# Patient Record
Sex: Male | Born: 1935 | Race: White | Hispanic: No | Marital: Married | Smoking: Current some day smoker
Health system: Southern US, Community
[De-identification: ages and names within clinical notes are randomized; demographics above are authoritative.]

## PROBLEM LIST (undated history)

## (undated) DIAGNOSIS — Z860100 Personal history of colon polyps, unspecified: Secondary | ICD-10-CM

## (undated) DIAGNOSIS — Z87442 Personal history of urinary calculi: Secondary | ICD-10-CM

## (undated) DIAGNOSIS — E785 Hyperlipidemia, unspecified: Secondary | ICD-10-CM

## (undated) DIAGNOSIS — I4430 Unspecified atrioventricular block: Secondary | ICD-10-CM

## (undated) DIAGNOSIS — R238 Other skin changes: Secondary | ICD-10-CM

## (undated) DIAGNOSIS — R3915 Urgency of urination: Secondary | ICD-10-CM

## (undated) DIAGNOSIS — J449 Chronic obstructive pulmonary disease, unspecified: Secondary | ICD-10-CM

## (undated) DIAGNOSIS — C61 Malignant neoplasm of prostate: Secondary | ICD-10-CM

## (undated) DIAGNOSIS — R600 Localized edema: Secondary | ICD-10-CM

## (undated) DIAGNOSIS — R519 Headache, unspecified: Secondary | ICD-10-CM

## (undated) DIAGNOSIS — Z86718 Personal history of other venous thrombosis and embolism: Secondary | ICD-10-CM

## (undated) DIAGNOSIS — M1990 Unspecified osteoarthritis, unspecified site: Secondary | ICD-10-CM

## (undated) DIAGNOSIS — R42 Dizziness and giddiness: Secondary | ICD-10-CM

## (undated) DIAGNOSIS — G4700 Insomnia, unspecified: Secondary | ICD-10-CM

## (undated) DIAGNOSIS — R351 Nocturia: Secondary | ICD-10-CM

## (undated) DIAGNOSIS — I2510 Atherosclerotic heart disease of native coronary artery without angina pectoris: Secondary | ICD-10-CM

## (undated) DIAGNOSIS — D649 Anemia, unspecified: Secondary | ICD-10-CM

## (undated) DIAGNOSIS — I720 Aneurysm of carotid artery: Secondary | ICD-10-CM

## (undated) DIAGNOSIS — C3490 Malignant neoplasm of unspecified part of unspecified bronchus or lung: Secondary | ICD-10-CM

## (undated) DIAGNOSIS — I10 Essential (primary) hypertension: Secondary | ICD-10-CM

## (undated) DIAGNOSIS — R51 Headache: Secondary | ICD-10-CM

## (undated) DIAGNOSIS — R233 Spontaneous ecchymoses: Secondary | ICD-10-CM

## (undated) DIAGNOSIS — M797 Fibromyalgia: Secondary | ICD-10-CM

## (undated) DIAGNOSIS — G2581 Restless legs syndrome: Secondary | ICD-10-CM

## (undated) DIAGNOSIS — R609 Edema, unspecified: Secondary | ICD-10-CM

## (undated) DIAGNOSIS — R0602 Shortness of breath: Secondary | ICD-10-CM

## (undated) DIAGNOSIS — K219 Gastro-esophageal reflux disease without esophagitis: Secondary | ICD-10-CM

## (undated) HISTORY — PX: CATARACT EXTRACTION W/ INTRAOCULAR LENS IMPLANT: SHX1309

## (undated) HISTORY — PX: LITHOTRIPSY: SUR834

## (undated) HISTORY — DX: Essential (primary) hypertension: I10

## (undated) HISTORY — DX: Shortness of breath: R06.02

## (undated) HISTORY — PX: INSERT / REPLACE / REMOVE PACEMAKER: SUR710

## (undated) HISTORY — DX: Hyperlipidemia, unspecified: E78.5

## (undated) HISTORY — DX: Chronic obstructive pulmonary disease, unspecified: J44.9

## (undated) HISTORY — PX: PROSTATE CRYOABLATION: SUR358

## (undated) HISTORY — PX: CYSTOSCOPY: SUR368

## (undated) HISTORY — DX: Atherosclerotic heart disease of native coronary artery without angina pectoris: I25.10

## (undated) HISTORY — DX: Anemia, unspecified: D64.9

## (undated) HISTORY — PX: CARDIAC CATHETERIZATION: SHX172

## (undated) HISTORY — PX: COLONOSCOPY: SHX174

## (undated) HISTORY — DX: Gastro-esophageal reflux disease without esophagitis: K21.9

## (undated) HISTORY — DX: Unspecified osteoarthritis, unspecified site: M19.90

---
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
## 1989-03-23 HISTORY — PX: INSERT / REPLACE / REMOVE PACEMAKER: SUR710

## 1998-10-09 ENCOUNTER — Encounter: Payer: Self-pay | Admitting: Gastroenterology

## 1998-10-09 ENCOUNTER — Ambulatory Visit (HOSPITAL_COMMUNITY): Admission: RE | Admit: 1998-10-09 | Discharge: 1998-10-09 | Payer: Self-pay | Admitting: Gastroenterology

## 1998-11-22 HISTORY — PX: CHOLECYSTECTOMY: SHX55

## 1998-11-22 HISTORY — PX: APPENDECTOMY: SHX54

## 1998-12-27 ENCOUNTER — Other Ambulatory Visit: Admission: RE | Admit: 1998-12-27 | Discharge: 1998-12-27 | Payer: Self-pay | Admitting: Gastroenterology

## 1998-12-27 ENCOUNTER — Encounter (INDEPENDENT_AMBULATORY_CARE_PROVIDER_SITE_OTHER): Payer: Self-pay | Admitting: Specialist

## 1999-10-25 ENCOUNTER — Inpatient Hospital Stay (HOSPITAL_COMMUNITY): Admission: EM | Admit: 1999-10-25 | Discharge: 1999-10-28 | Payer: Self-pay | Admitting: *Deleted

## 1999-10-25 ENCOUNTER — Encounter: Payer: Self-pay | Admitting: *Deleted

## 2000-02-10 ENCOUNTER — Other Ambulatory Visit: Admission: RE | Admit: 2000-02-10 | Discharge: 2000-02-10 | Payer: Self-pay | Admitting: Gastroenterology

## 2000-02-10 ENCOUNTER — Encounter (INDEPENDENT_AMBULATORY_CARE_PROVIDER_SITE_OTHER): Payer: Self-pay

## 2000-05-12 ENCOUNTER — Ambulatory Visit (HOSPITAL_COMMUNITY): Admission: RE | Admit: 2000-05-12 | Discharge: 2000-05-12 | Payer: Self-pay | Admitting: Neurosurgery

## 2000-05-12 ENCOUNTER — Encounter: Payer: Self-pay | Admitting: Neurosurgery

## 2000-05-19 ENCOUNTER — Ambulatory Visit: Admission: RE | Admit: 2000-05-19 | Discharge: 2000-05-19 | Payer: Self-pay | Admitting: Interventional Radiology

## 2000-05-21 ENCOUNTER — Ambulatory Visit (HOSPITAL_COMMUNITY): Admission: RE | Admit: 2000-05-21 | Discharge: 2000-05-21 | Payer: Self-pay | Admitting: Interventional Radiology

## 2001-06-01 ENCOUNTER — Ambulatory Visit (HOSPITAL_COMMUNITY): Admission: RE | Admit: 2001-06-01 | Discharge: 2001-06-01 | Payer: Self-pay | Admitting: Vascular Surgery

## 2001-06-01 ENCOUNTER — Encounter: Payer: Self-pay | Admitting: Vascular Surgery

## 2002-07-04 ENCOUNTER — Ambulatory Visit (HOSPITAL_COMMUNITY): Admission: RE | Admit: 2002-07-04 | Discharge: 2002-07-05 | Payer: Self-pay | Admitting: Vascular Surgery

## 2002-07-04 ENCOUNTER — Encounter: Payer: Self-pay | Admitting: Vascular Surgery

## 2003-07-19 ENCOUNTER — Encounter: Admission: RE | Admit: 2003-07-19 | Discharge: 2003-07-19 | Payer: Self-pay | Admitting: Vascular Surgery

## 2004-01-03 ENCOUNTER — Ambulatory Visit (HOSPITAL_COMMUNITY): Admission: RE | Admit: 2004-01-03 | Discharge: 2004-01-03 | Payer: Self-pay | Admitting: Cardiology

## 2004-01-28 ENCOUNTER — Ambulatory Visit: Payer: Self-pay | Admitting: Cardiology

## 2004-02-01 ENCOUNTER — Ambulatory Visit: Payer: Self-pay | Admitting: Cardiology

## 2004-03-03 ENCOUNTER — Ambulatory Visit: Payer: Self-pay | Admitting: Cardiovascular Disease

## 2004-03-31 ENCOUNTER — Emergency Department (HOSPITAL_COMMUNITY): Admission: EM | Admit: 2004-03-31 | Discharge: 2004-04-01 | Payer: Self-pay | Admitting: Emergency Medicine

## 2004-03-31 ENCOUNTER — Ambulatory Visit: Payer: Self-pay | Admitting: Cardiology

## 2004-04-02 ENCOUNTER — Ambulatory Visit: Payer: Self-pay | Admitting: Cardiology

## 2004-04-03 ENCOUNTER — Ambulatory Visit: Payer: Self-pay | Admitting: Internal Medicine

## 2004-04-14 ENCOUNTER — Ambulatory Visit: Payer: Self-pay | Admitting: Internal Medicine

## 2004-04-16 ENCOUNTER — Emergency Department (HOSPITAL_COMMUNITY): Admission: EM | Admit: 2004-04-16 | Discharge: 2004-04-16 | Payer: Self-pay | Admitting: Emergency Medicine

## 2004-04-18 ENCOUNTER — Ambulatory Visit: Payer: Self-pay | Admitting: Hematology & Oncology

## 2004-05-13 ENCOUNTER — Ambulatory Visit: Payer: Self-pay | Admitting: Cardiology

## 2004-06-01 ENCOUNTER — Ambulatory Visit: Payer: Self-pay | Admitting: Internal Medicine

## 2004-06-01 ENCOUNTER — Inpatient Hospital Stay (HOSPITAL_COMMUNITY): Admission: EM | Admit: 2004-06-01 | Discharge: 2004-06-03 | Payer: Self-pay | Admitting: Emergency Medicine

## 2004-06-02 ENCOUNTER — Encounter: Payer: Self-pay | Admitting: Internal Medicine

## 2004-06-10 ENCOUNTER — Ambulatory Visit: Payer: Self-pay | Admitting: Internal Medicine

## 2004-06-30 ENCOUNTER — Ambulatory Visit: Payer: Self-pay | Admitting: Cardiology

## 2004-07-28 ENCOUNTER — Ambulatory Visit: Payer: Self-pay | Admitting: *Deleted

## 2004-08-25 ENCOUNTER — Ambulatory Visit: Payer: Self-pay | Admitting: Cardiology

## 2004-09-22 ENCOUNTER — Ambulatory Visit: Payer: Self-pay | Admitting: Cardiology

## 2004-10-02 ENCOUNTER — Ambulatory Visit: Payer: Self-pay | Admitting: Cardiology

## 2004-12-10 ENCOUNTER — Ambulatory Visit: Payer: Self-pay | Admitting: Cardiology

## 2004-12-17 ENCOUNTER — Emergency Department (HOSPITAL_COMMUNITY): Admission: EM | Admit: 2004-12-17 | Discharge: 2004-12-17 | Payer: Self-pay | Admitting: Emergency Medicine

## 2004-12-22 ENCOUNTER — Ambulatory Visit (HOSPITAL_COMMUNITY): Admission: RE | Admit: 2004-12-22 | Discharge: 2004-12-24 | Payer: Self-pay | Admitting: General Surgery

## 2004-12-22 ENCOUNTER — Encounter (INDEPENDENT_AMBULATORY_CARE_PROVIDER_SITE_OTHER): Payer: Self-pay | Admitting: Specialist

## 2004-12-22 ENCOUNTER — Ambulatory Visit: Payer: Self-pay | Admitting: Gastroenterology

## 2005-05-04 ENCOUNTER — Ambulatory Visit: Payer: Self-pay | Admitting: Cardiology

## 2005-05-04 ENCOUNTER — Observation Stay (HOSPITAL_COMMUNITY): Admission: EM | Admit: 2005-05-04 | Discharge: 2005-05-05 | Payer: Self-pay | Admitting: Emergency Medicine

## 2005-05-05 ENCOUNTER — Ambulatory Visit: Payer: Self-pay

## 2005-06-18 ENCOUNTER — Ambulatory Visit: Payer: Self-pay | Admitting: Cardiology

## 2005-08-10 ENCOUNTER — Encounter: Admission: RE | Admit: 2005-08-10 | Discharge: 2005-08-10 | Payer: Self-pay | Admitting: Vascular Surgery

## 2005-08-31 ENCOUNTER — Ambulatory Visit: Payer: Self-pay | Admitting: Cardiology

## 2005-09-04 ENCOUNTER — Ambulatory Visit (HOSPITAL_BASED_OUTPATIENT_CLINIC_OR_DEPARTMENT_OTHER): Admission: RE | Admit: 2005-09-04 | Discharge: 2005-09-04 | Payer: Self-pay | Admitting: Urology

## 2005-09-07 ENCOUNTER — Ambulatory Visit (HOSPITAL_COMMUNITY): Admission: RE | Admit: 2005-09-07 | Discharge: 2005-09-07 | Payer: Self-pay | Admitting: Urology

## 2006-01-26 ENCOUNTER — Ambulatory Visit: Payer: Self-pay | Admitting: Internal Medicine

## 2006-03-03 ENCOUNTER — Ambulatory Visit: Payer: Self-pay | Admitting: Internal Medicine

## 2006-07-20 ENCOUNTER — Ambulatory Visit: Payer: Self-pay | Admitting: Cardiology

## 2007-05-02 ENCOUNTER — Ambulatory Visit: Payer: Self-pay | Admitting: Cardiology

## 2007-05-11 ENCOUNTER — Ambulatory Visit: Payer: Self-pay | Admitting: Internal Medicine

## 2007-05-31 ENCOUNTER — Ambulatory Visit: Payer: Self-pay | Admitting: Cardiology

## 2007-06-17 ENCOUNTER — Encounter: Payer: Self-pay | Admitting: Adult Health

## 2007-06-17 ENCOUNTER — Ambulatory Visit: Payer: Self-pay | Admitting: Pulmonary Disease

## 2007-06-17 ENCOUNTER — Ambulatory Visit: Payer: Self-pay | Admitting: Internal Medicine

## 2007-06-17 DIAGNOSIS — R0602 Shortness of breath: Secondary | ICD-10-CM

## 2007-06-17 DIAGNOSIS — J449 Chronic obstructive pulmonary disease, unspecified: Secondary | ICD-10-CM

## 2007-06-17 DIAGNOSIS — K219 Gastro-esophageal reflux disease without esophagitis: Secondary | ICD-10-CM

## 2007-06-17 DIAGNOSIS — J4489 Other specified chronic obstructive pulmonary disease: Secondary | ICD-10-CM | POA: Insufficient documentation

## 2007-06-17 DIAGNOSIS — T17308A Unspecified foreign body in larynx causing other injury, initial encounter: Secondary | ICD-10-CM

## 2007-08-12 ENCOUNTER — Encounter: Admission: RE | Admit: 2007-08-12 | Discharge: 2007-08-12 | Payer: Self-pay | Admitting: Vascular Surgery

## 2007-08-12 ENCOUNTER — Ambulatory Visit: Payer: Self-pay | Admitting: Vascular Surgery

## 2008-01-23 ENCOUNTER — Ambulatory Visit (HOSPITAL_COMMUNITY): Admission: RE | Admit: 2008-01-23 | Discharge: 2008-01-23 | Payer: Self-pay | Admitting: Urology

## 2008-02-08 ENCOUNTER — Telehealth (INDEPENDENT_AMBULATORY_CARE_PROVIDER_SITE_OTHER): Payer: Self-pay | Admitting: *Deleted

## 2008-03-01 ENCOUNTER — Ambulatory Visit: Payer: Self-pay | Admitting: Cardiology

## 2008-03-02 ENCOUNTER — Ambulatory Visit: Payer: Self-pay

## 2008-04-02 ENCOUNTER — Ambulatory Visit (HOSPITAL_BASED_OUTPATIENT_CLINIC_OR_DEPARTMENT_OTHER): Admission: RE | Admit: 2008-04-02 | Discharge: 2008-04-02 | Payer: Self-pay | Admitting: Urology

## 2008-07-13 ENCOUNTER — Encounter (INDEPENDENT_AMBULATORY_CARE_PROVIDER_SITE_OTHER): Payer: Self-pay | Admitting: Radiology

## 2008-10-03 ENCOUNTER — Encounter: Admission: RE | Admit: 2008-10-03 | Discharge: 2008-10-03 | Payer: Self-pay | Admitting: Family Medicine

## 2008-10-06 ENCOUNTER — Encounter: Payer: Self-pay | Admitting: Internal Medicine

## 2008-10-16 ENCOUNTER — Encounter: Payer: Self-pay | Admitting: Cardiology

## 2008-10-24 ENCOUNTER — Encounter: Payer: Self-pay | Admitting: Cardiology

## 2009-03-12 DIAGNOSIS — I10 Essential (primary) hypertension: Secondary | ICD-10-CM

## 2009-03-12 DIAGNOSIS — E785 Hyperlipidemia, unspecified: Secondary | ICD-10-CM

## 2009-03-12 DIAGNOSIS — I2699 Other pulmonary embolism without acute cor pulmonale: Secondary | ICD-10-CM | POA: Insufficient documentation

## 2009-03-12 DIAGNOSIS — R42 Dizziness and giddiness: Secondary | ICD-10-CM | POA: Insufficient documentation

## 2009-03-12 DIAGNOSIS — R079 Chest pain, unspecified: Secondary | ICD-10-CM

## 2009-03-12 DIAGNOSIS — I2510 Atherosclerotic heart disease of native coronary artery without angina pectoris: Secondary | ICD-10-CM

## 2009-03-12 DIAGNOSIS — R000 Tachycardia, unspecified: Secondary | ICD-10-CM

## 2009-03-21 ENCOUNTER — Ambulatory Visit: Payer: Self-pay | Admitting: Cardiology

## 2009-03-21 DIAGNOSIS — Z950 Presence of cardiac pacemaker: Secondary | ICD-10-CM

## 2009-04-10 ENCOUNTER — Encounter: Payer: Self-pay | Admitting: Internal Medicine

## 2009-04-10 ENCOUNTER — Telehealth: Payer: Self-pay | Admitting: Cardiology

## 2009-07-10 ENCOUNTER — Encounter: Payer: Self-pay | Admitting: Internal Medicine

## 2009-08-16 ENCOUNTER — Encounter: Admission: RE | Admit: 2009-08-16 | Discharge: 2009-08-16 | Payer: Self-pay | Admitting: Vascular Surgery

## 2009-08-16 ENCOUNTER — Ambulatory Visit: Payer: Self-pay | Admitting: Vascular Surgery

## 2009-10-09 ENCOUNTER — Encounter: Payer: Self-pay | Admitting: Internal Medicine

## 2009-10-22 ENCOUNTER — Telehealth: Payer: Self-pay | Admitting: Cardiology

## 2009-10-24 ENCOUNTER — Ambulatory Visit: Payer: Self-pay | Admitting: Cardiology

## 2009-10-28 LAB — CONVERTED CEMR LAB
Basophils Relative: 0.9 % (ref 0.0–3.0)
CO2: 27 meq/L (ref 19–32)
Calcium: 11.4 mg/dL — ABNORMAL HIGH (ref 8.4–10.5)
Creatinine, Ser: 0.9 mg/dL (ref 0.4–1.5)
Eosinophils Absolute: 0.1 10*3/uL (ref 0.0–0.7)
Glucose, Bld: 87 mg/dL (ref 70–99)
HCT: 43.9 % (ref 39.0–52.0)
Lymphs Abs: 2.6 10*3/uL (ref 0.7–4.0)
MCHC: 34.4 g/dL (ref 30.0–36.0)
MCV: 93.1 fL (ref 78.0–100.0)
Monocytes Absolute: 0.6 10*3/uL (ref 0.1–1.0)
Neutrophils Relative %: 58.5 % (ref 43.0–77.0)
Platelets: 295 10*3/uL (ref 150.0–400.0)
RBC: 4.72 M/uL (ref 4.22–5.81)

## 2009-11-06 ENCOUNTER — Telehealth (INDEPENDENT_AMBULATORY_CARE_PROVIDER_SITE_OTHER): Payer: Self-pay | Admitting: *Deleted

## 2009-11-07 ENCOUNTER — Ambulatory Visit (HOSPITAL_COMMUNITY): Admission: RE | Admit: 2009-11-07 | Discharge: 2009-11-07 | Payer: Self-pay | Admitting: Cardiology

## 2009-11-07 ENCOUNTER — Ambulatory Visit: Payer: Self-pay

## 2009-11-07 ENCOUNTER — Encounter: Payer: Self-pay | Admitting: Internal Medicine

## 2009-11-07 ENCOUNTER — Ambulatory Visit: Payer: Self-pay | Admitting: Cardiology

## 2009-11-07 ENCOUNTER — Encounter: Payer: Self-pay | Admitting: Cardiology

## 2009-11-07 ENCOUNTER — Ambulatory Visit: Payer: Self-pay | Admitting: Internal Medicine

## 2009-11-07 ENCOUNTER — Encounter (HOSPITAL_COMMUNITY): Admission: RE | Admit: 2009-11-07 | Discharge: 2009-12-09 | Payer: Self-pay | Admitting: Cardiology

## 2009-11-07 LAB — CONVERTED CEMR LAB
Calcium: 11 mg/dL — ABNORMAL HIGH (ref 8.4–10.5)
Creatinine, Ser: 0.8 mg/dL (ref 0.4–1.5)
GFR calc non Af Amer: 98.81 mL/min (ref >60)
Glucose, Bld: 74 mg/dL (ref 70–99)
Sodium: 142 meq/L (ref 135–145)

## 2009-11-11 ENCOUNTER — Telehealth: Payer: Self-pay | Admitting: Cardiology

## 2009-11-12 ENCOUNTER — Ambulatory Visit: Payer: Self-pay | Admitting: Cardiology

## 2009-11-19 ENCOUNTER — Ambulatory Visit: Payer: Self-pay | Admitting: Internal Medicine

## 2009-11-21 ENCOUNTER — Encounter: Payer: Self-pay | Admitting: Cardiology

## 2009-11-22 ENCOUNTER — Encounter (INDEPENDENT_AMBULATORY_CARE_PROVIDER_SITE_OTHER): Payer: Self-pay | Admitting: *Deleted

## 2009-11-22 ENCOUNTER — Ambulatory Visit: Payer: Self-pay | Admitting: Cardiology

## 2009-11-22 DIAGNOSIS — I442 Atrioventricular block, complete: Secondary | ICD-10-CM

## 2009-11-22 LAB — CONVERTED CEMR LAB
BUN: 15 mg/dL (ref 6–23)
Basophils Relative: 0.8 % (ref 0.0–3.0)
Calcium: 10.4 mg/dL (ref 8.4–10.5)
Chloride: 102 meq/L (ref 96–112)
Creatinine, Ser: 1.2 mg/dL (ref 0.4–1.5)
Eosinophils Relative: 2.9 % (ref 0.0–5.0)
GFR calc non Af Amer: 62.77 mL/min (ref >60)
INR: 1.6 — ABNORMAL HIGH (ref 0.8–1.0)
Lymphocytes Relative: 30.5 % (ref 12.0–46.0)
MCV: 93.2 fL (ref 78.0–100.0)
Monocytes Relative: 9.7 % (ref 3.0–12.0)
Neutrophils Relative %: 56.1 % (ref 43.0–77.0)
Platelets: 244 10*3/uL (ref 150.0–400.0)
Prothrombin Time: 16.8 s — ABNORMAL HIGH (ref 9.7–11.8)
RBC: 4.26 M/uL (ref 4.22–5.81)
WBC: 7.7 10*3/uL (ref 4.5–10.5)
aPTT: 36.2 s — ABNORMAL HIGH (ref 21.7–28.8)

## 2009-11-26 ENCOUNTER — Ambulatory Visit: Payer: Self-pay | Admitting: Cardiology

## 2009-11-26 ENCOUNTER — Ambulatory Visit (HOSPITAL_COMMUNITY): Admission: RE | Admit: 2009-11-26 | Discharge: 2009-11-26 | Payer: Self-pay | Admitting: Cardiology

## 2009-11-27 ENCOUNTER — Encounter: Payer: Self-pay | Admitting: Cardiology

## 2009-12-11 ENCOUNTER — Ambulatory Visit: Payer: Self-pay | Admitting: Cardiology

## 2009-12-30 ENCOUNTER — Encounter: Payer: Self-pay | Admitting: Internal Medicine

## 2010-01-17 ENCOUNTER — Encounter (INDEPENDENT_AMBULATORY_CARE_PROVIDER_SITE_OTHER): Payer: Self-pay | Admitting: *Deleted

## 2010-03-20 ENCOUNTER — Ambulatory Visit: Payer: Self-pay | Admitting: Internal Medicine

## 2010-04-02 ENCOUNTER — Encounter (INDEPENDENT_AMBULATORY_CARE_PROVIDER_SITE_OTHER): Payer: Self-pay | Admitting: *Deleted

## 2010-04-12 ENCOUNTER — Encounter: Payer: Self-pay | Admitting: Gastroenterology

## 2010-04-13 ENCOUNTER — Encounter: Payer: Self-pay | Admitting: Vascular Surgery

## 2010-04-22 NOTE — Progress Notes (Signed)
Summary: Nuclear pre procedure  Phone Note Outgoing Call Call back at [facility] Phone [phone]   Call placed by: Rea College, CMA,  November 06, 2009 4:12 PM Call placed to: Patient Summary of Call: Reviewed information on Myoview Information Sheet (see scanned document for further details).  Annice Pih spoke with wife.   Initial call taken by: Rea College, CMA,  November 06, 2009 4:12 PM     Nuclear Med Background Indications for Stress Test: Evaluation for Ischemia   History: COPD, Echo, Heart Catheterization, Myocardial Perfusion Study, Pacemaker  History Comments: '91 PTVP; '01 Cath:n/o CAD; '06 Echo:normal LVF; '09 MWN:UUVOZD; h/o PE  Symptoms: DOE, SOB    Nuclear Pre-Procedure Cardiac Risk Factors: Family History - CAD, History of Smoking, Hypertension, Lipids Height (in): 60

## 2010-04-22 NOTE — Miscellaneous (Signed)
Summary: Device change out  Clinical Lists Changes  Observations: Added new observation of PPM DOI: 11/26/2009 (11/27/2009 9:20) Added new observation of PPM SERL#: [id number]  (11/27/2009 9:20) Added new observation of PPM MODL#: [id number]  (11/27/2009 1:19) Added new observation of PPMEXPLCOMM: 11/26/09 St. Jude [id number] explanted  (11/27/2009 9:20) Added new observation of MAGNET RTE: BOL 100 ERI 85  (11/27/2009 9:20)      PPM Specifications Following MD:  Everardo Beals. Juanda Chance, MD     PPM Vendor:  St Jude     PPM Model Number:  [id number]     PPM Serial Number:  [id number] PPM DOI:  11/26/2009     PPM Implanting MD:  Everardo Beals. Juanda Chance, MD  Lead 1    Location: RA     DOI: 10/11/1989     Model #: [id number]     Serial #: [id number]     Status: active Lead 2    Location: RV     DOI: 10/11/1989     Model #: 1216T     Serial #: [id number]     Status: active  Magnet Response Rate:  BOL 100 ERI 85  Indications:  COMPLETE HEART BLOCK SYNCOPE  Explantation Comments:  11/26/09 St. Jude [id number] explanted  PPM Follow Up Pacer Dependent:  No      Parameters Mode:  DDD     Lower Rate Limit:  55     Upper Rate Limit:  110 Paced AV Delay:  300

## 2010-04-22 NOTE — Cardiovascular Report (Signed)
Summary: TTM   TTM   Imported By: Roderic Ovens 04/30/2009 15:58:46  _____________________________________________________________________  External Attachment:    Type:   Image     Comment:   External Document

## 2010-04-22 NOTE — Miscellaneous (Signed)
Summary: dx correction  Clinical Lists Changes  Problems: Changed problem from PACEMAKER (ICD-V45..01) to PACEMAKER, PERMANENT (ICD-V45.01) changed the incorrect dx code to correct dx code Donna Keene  January 17, 2010 12:45 PM 

## 2010-04-22 NOTE — Assessment & Plan Note (Signed)
Summary: Cardiology Nuclear Testing  Nuclear Med Background Indications for Stress Test: Evaluation for Ischemia   History: COPD, Echo, Heart Catheterization, Myocardial Perfusion Study, Pacemaker  History Comments: '91 PTVP; '01 Cath:n/o CAD; '06 Echo:normal LVF; '09 ZOX:WRUEAV; h/o PE  Symptoms: DOE, SOB    Nuclear Pre-Procedure Cardiac Risk Factors: Family History - CAD, History of Smoking, Hypertension, Lipids Caffeine/Decaff Intake: None NPO After: 9:30 PM Lungs: clear IV 0.9% NS with Angio Cath: 22g     IV Site: Rt AC IV Started by: Bonnita Levan RN Chest Size (in) 42     Height (in): 60 Weight (lb): 169 BMI: 33.12  Nuclear Med Study 1 or 2 day study:  1 day     Stress Test Type:  Eugenie Birks Reading MD:  Arvilla Meres, MD     Referring MD:  B.Brodie Resting Radionuclide:  Technetium 39m Tetrofosmin     Resting Radionuclide Dose:  10.8 mCi  Stress Radionuclide:  Technetium 14m Tetrofosmin     Stress Radionuclide Dose:  33 mCi   Stress Protocol   Lexiscan: 0.4 mg   Stress Test Technologist:  Milana Na EMT-P     Nuclear Technologist:  Domenic Polite CNMT  Rest Procedure  Myocardial perfusion imaging was performed at rest 45 minutes following the intravenous administration of Myoview Technetium 27m Tetrofosmin.  Stress Procedure  The patient received IV Lexiscan 0.4 mg over 15-seconds.  Myoview injected at 30-seconds.  There were no significant changes, sob, and rare pacs/pvc with infusion.  Quantitative spect images were obtained after a 45 minute delay.  QPS Raw Data Images:  Normal; no motion artifact; normal heart/lung ratio. Stress Images:  There is normal uptake in all areas. Rest Images:  Normal homogeneous uptake in all areas of the myocardium. Subtraction (SDS):  Normal Transient Ischemic Dilatation:  .93  (Normal <1.22)  Lung/Heart Ratio:  .29  (Normal <0.45)  Quantitative Gated Spect Images QGS EDV:  108 ml QGS ESV:  31 ml QGS EF:  71 % QGS  cine images:  Normal  Findings Normal nuclear study      Overall Impression  Exercise Capacity: Lexiscan study with no exercise. ECG Impression: Baseline: NSR; No significant ST segment change with Lexiscan. Overall Impression: Normal stress nuclear study.  Appended Document: Cardiology Nuclear Testing Pt. aware of test results.

## 2010-04-22 NOTE — Progress Notes (Signed)
Summary: test results   Phone Note Call from Patient Call back at Home Phone [phone]   Caller: Patient Reason for Call: Talk to Nurse, Lab or Test Results Details for Reason: stress test Initial call taken by: Lorne Skeens,  November 11, 2009 10:52 AM  Follow-up for Phone Call        Spoke with Pt. stress test and echo results given. Pt. is scheduled for lab work D-Dimer, tomorrow 11/12/09. A  PFT was order for pt. Patient needs a F/U with Dr. Juanda Chance after labs and PFT is done Pt. aware. Follow-up by: Ollen Gross, RN, BSN,  November 11, 2009 11:26 AM

## 2010-04-22 NOTE — Assessment & Plan Note (Signed)
Summary: rov  Medications Added TYLENOL 325 MG  TABS (ACETAMINOPHEN) per bottle prn ALKA-SELTZER PLUS COLD/SINUS 30-325 MG  CAPS (PSEUDOEPHEDRINE-APAP) per container prn      Allergies Added:   Visit Type:  Follow-up Primary Provider:  Dr Ishmael Holter garden  CC:  sob and swelling in ankles.  History of Present Illness: the patient is 77 years old and came in today for an unscheduled visit because of shortness of breath. He says he gets short of breath just walking to the mailbox. This has been progressive over the last few weeks to months. He has had no associated chest pain or palpitations.  His other problems include hypertension hyperlipidemia, COPD, and GERD. He also history of pulmonary embolus without any precipitating cause and is on chronic Coumadin therapy managed by Dr. Jeannetta Nap. He also had prostate cancer treated with cryotherapy at the beginning of this year. He's had a previous catheterization showed nonobstructive CAD and he had a negative Myoview scan a year ago.   Current Medications (verified): 1)  Micardis 40 Mg  Tabs (Telmisartan) .... Take 1 Tablet By Mouth Once A Day 2)  Warfarin Sodium 5 Mg  Tabs (Warfarin Sodium) .... Take 1 Tablet By Mouth Once A Day 3)  Lovastatin 20 Mg  Tabs (Lovastatin) .... Take 1 Tablet By Mouth Once A Day 4)  Bayer Aspirin Ec Low Dose 81 Mg  Tbec (Aspirin) .... Take 1 Tablet By Mouth Once A Day 5)  Metoprolol Tartrate 50 Mg  Tabs (Metoprolol Tartrate) .... Take 1 Tablet By Mouth Once A Day 6)  Tylenol 325 Mg  Tabs (Acetaminophen) .... Per Bottle Prn 7)  Alka-Seltzer Plus Cold/sinus 30-325 Mg  Caps (Pseudoephedrine-Apap) .... Per Container Prn 8)  Niacin 500 Mg Tabs (Niacin) .Marland Kitchen.. 1 Tab Two Times A Day  Allergies (verified): 1)  ! Penicillin 2)  ! * Contrast Dye  Past History:  Past Medical History: Reviewed history from 03/20/2009 and no changes required. Current Problems:  CAD (ICD-414.00) TACHYCARDIA  (ICD-785.0) HYPERLIPIDEMIA (ICD-272.4) HYPERTENSION (ICD-401.9) CHEST PAIN UNSPECIFIED (ICD-786.50) PULMONARY EMBOLISM (ICD-415.19) DIZZINESS (ICD-780.4) COPD (ICD-496) Hx of GERD (ICD-530.81) DYSPNEA (ICD-786.05) Hx of CHOKING (ICD-933.1) 2. Hypertension. 3. Hyperlipidemia. 4. History of nonobstructive coronary artery disease. 5. History of sinus tachycardia. 6. Status post programmed to DDD pacemaker implantation in 1991 for     atrioventricular block, not pacer dependent. 7. Chronic obstructive pulmonary disease. 8. Gastroesophageal reflux disease.     Review of Systems       ROS is negative except as outlined in HPI.   Vital Signs:  Patient profile:   77 year old male Height:      60 inches Weight:      167 pounds Pulse rate:   56 / minute BP sitting:   125 / 70  (left arm) Cuff size:   regular  Vitals Entered By: Burnett Kanaris, CNA (October 24, 2009 8:29 AM)  Physical Exam  Additional Exam:  Gen. Well-nourished, in no distress   Neck: No JVD, thyroid not enlarged, no carotid bruits Lungs: No tachypnea, clear without rales, rhonchi or wheezes Cardiovascular: Rhythm regular, PMI not displaced,  heart sounds  normal, no murmurs or gallops, no peripheral edema, pulses normal in all 4 extremities. Abdomen: BS normal, abdomen soft and non-tender without masses or organomegaly, no hepatosplenomegaly. MS: No deformities, no cyanosis or clubbing   Neuro:  No focal sns   Skin:  no lesions    PPM Specifications Following MD:  Everardo Beals. Juanda Chance,  MD     PPM Vendor:  St Jude     PPM Model Number:  2016T     PPM Serial Number:  [id number] PPM DOI:  10/11/1989     PPM Implanting MD:  Everardo Beals. Juanda Chance, MD  Lead 1    Location: RA     DOI: 10/11/1989     Model #: 1028T     Serial #: [id number]     Status: active Lead 2    Location: RV     DOI: 10/11/1989     Model #: 1216T     Serial #: [id number]     Status: active   Indications:  COMPLETE HEART BLOCK SYNCOPE   PPM Follow Up Remote  Check?  No Battery Voltage:  2.40 V     Battery Est. Longevity:  ERI     Pacer Dependent:  No       PPM Device Measurements Atrium  Amplitude: 2.0 mV, Impedance: 591 ohms, Threshold: 1.5 V at 0.6 msec Right Ventricle  Amplitude: 5.0 mV, Impedance: 438 ohms, Threshold: 3.0 V at 1.0 msec  Parameters Mode:  DDD     Lower Rate Limit:  55     Upper Rate Limit:  110 Paced AV Delay:  300     Tech Comments:  Battery @ ERI per tech services @ [employer].   Altha Harm, LPN  October 24, 2009 9:13 AM   Impression & Recommendations:  Problem # 1:  DYSPNEA (ICD-786.05) The etiology of his dyspnea is not   he has no signs of CHF. He does have COPD. He also has a history of pulmonary embolism. We will get a chest x-ray, echocardiogram, and stress Myoview. We will try to determine etiology of his shortness of breath before we replace his generator which is now at [facility]. His updated medication list for this problem includes:    Micardis 40 Mg Tabs (Telmisartan) .Marland Kitchen... Take 1 tablet by mouth once a day    Bayer Aspirin Ec Low Dose 81 Mg Tbec (Aspirin) .Marland Kitchen... Take 1 tablet by mouth once a day    Metoprolol Tartrate 50 Mg Tabs (Metoprolol tartrate) .Marland Kitchen... Take 1 tablet by mouth once a day  Problem # 2:  PACEMAKER (ICD-V45.Marland Kitchen01) He has an old pacemaker that is now at [facility]. We have been programmed to a long AV delay and he is atrial pacing. We will try to determine the etiology of his dyspnea before we replace his generator  Problem # 3:  HYPERTENSION (ICD-401.9)  This is controlled on current medications. His updated medication list for this problem includes:    Micardis 40 Mg Tabs (Telmisartan) .Marland Kitchen... Take 1 tablet by mouth once a day    Bayer Aspirin Ec Low Dose 81 Mg Tbec (Aspirin) .Marland Kitchen... Take 1 tablet by mouth once a day    Metoprolol Tartrate 50 Mg Tabs (Metoprolol tartrate) .Marland Kitchen... Take 1 tablet by mouth once a day  Orders: T-2 View CXR (71020TC) Echocardiogram (Echo) TLB-BMP (Basic Metabolic Panel-BMET)  (80048-METABOL) TLB-BNP (B-Natriuretic Peptide) (83880-BNPR) TLB-CBC Platelet - w/Differential (85025-CBCD) TLB-TSH (Thyroid Stimulating Hormone) (84443-TSH)  His updated medication list for this problem includes:    Micardis 40 Mg Tabs (Telmisartan) .Marland Kitchen... Take 1 tablet by mouth once a day    Bayer Aspirin Ec Low Dose 81 Mg Tbec (Aspirin) .Marland Kitchen... Take 1 tablet by mouth once a day    Metoprolol Tartrate 50 Mg Tabs (Metoprolol tartrate) .Marland Kitchen... Take 1 tablet by mouth once a day  Other Orders: Nuclear Stress Test (Nuc Stress Test)  Patient Instructions: 1)  Your physician recommends that you have lab work today: bmet/cbc/bnp/tsh (786.05;427.89) 2)  A chest x-ray takes a picture of the organs and structures inside the chest, including the heart, lungs, and blood vessels. This test can show several things, including, whether the heart is enlarged; whether fluid is building up in the lungs; and whether pacemaker / defibrillator leads are still in place. 3)  Your physician has requested that you have an echocardiogram.  Echocardiography is a painless test that uses sound waves to create images of your heart. It provides your doctor with information about the size and shape of your heart and how well your heart's chambers and valves are working.  This procedure takes approximately one hour. There are no restrictions for this procedure. 4)  Your physician has requested that you have an exercise stress myoview.  For further information please visit [email or web address]  Please follow instruction sheet, as given. 5)  Your physician recommends that you schedule a follow-up appointment after your testing is completed. 6)  Your physician recommends that you continue on your current medications as directed. Please refer to the Current Medication list given to you today.

## 2010-04-22 NOTE — Cardiovascular Report (Signed)
Summary: Office Visit   Office Visit   Imported By: Roderic Ovens 12/20/2009 12:46:28  _____________________________________________________________________  External Attachment:    Type:   Image     Comment:   External Document

## 2010-04-22 NOTE — Cardiovascular Report (Signed)
Summary: TTM   TTM   Imported By: Roderic Ovens 01/22/2010 14:25:55  _____________________________________________________________________  External Attachment:    Type:   Image     Comment:   External Document

## 2010-04-22 NOTE — Miscellaneous (Signed)
Summary: Orders Update pft charges  Clinical Lists Changes  Orders: Added new Service order of Carbon Monoxide diffusing w/capacity (94720) - Signed Added new Service order of Lung Volumes (94240) - Signed Added new Service order of Spirometry (Pre & Post) (94060) - Signed 

## 2010-04-22 NOTE — Cardiovascular Report (Signed)
Summary: Pre-Op Orders  Pre-Op Orders   Imported By: Marylou Mccoy 12/11/2009 10:45:39  _____________________________________________________________________  External Attachment:    Type:   Image     Comment:   External Document

## 2010-04-22 NOTE — Letter (Signed)
Summary: Implantable Device Instructions  Architectural technologist, Main Office  [address]   [place], [place] [address]   Phone: [phone]  Fax: [phone]      Implantable Device Instructions  You are scheduled for:  _____ Permanent Transvenous Pacemaker _____ Implantable Cardioverter Defibrillator _____ Implantable Loop Recorder __x___ Generator Change  on Tuesday 11/26/09 with Dr. Juanda Chance.  1.  Please arrive at the Short Stay Center at [facility] at 5:30 am on the day of your procedure.  2.  Do not eat or drink the night before your procedure.  3.  Complete lab work on ( 11/22/09).  The lab at [facility] is open from 8:30 AM to 1:30 PM and from 2:30 PM to 5:00 PM.  The lab at [facility] is open from 7:30 AM to 5:30 PM.  You do not have to be fasting.  4.  Do NOT take these medications for __x__ days prior to your procedure:  Hold Coumadin saturday & sunday. Take comadin 2.5mg  on monday 9/5.  5.  Plan for an overnight stay.  Bring your insurance cards and a list of your medications.  6.  Wash your chest and neck with antibacterial soap (any brand) the evening before and the morning of your procedure.  Rinse well.  7.  Education material received:     Pacemaker _____           ICD _____           Arrhythmia _____  *If you have ANY questions after you get home, please call the office [phone].  *Every attempt is made to prevent procedures from being rescheduled.  Due to the nauture of Electrophysiology, rescheduling can happen.  The physician is always aware and directs the staff when this occurs.

## 2010-04-22 NOTE — Cardiovascular Report (Signed)
Summary: TTM   TTM   Imported By: Roderic Ovens 11/13/2009 11:47:33  _____________________________________________________________________  External Attachment:    Type:   Image     Comment:   External Document

## 2010-04-22 NOTE — Assessment & Plan Note (Signed)
Summary: per check out      Allergies Added:   Visit Type:  Follow-up Primary Provider:  Dr Ishmael Holter garden  CC:  sob.  History of Present Illness: The patient is 77 years old and returns for continued evaluation of dyspnea and management of his pacemaker.  He had a [employer]. Jude pacemaker implanted in 1991 for AV block. Is not pacer dependent. He recently reached ERI.  He also has had symptoms of dyspnea. We have our him with a Myoview scan which was negative for ischemia, with a chest x-ray which showed some hyperaeration, with an echocardiogram showed ejection fraction of 60-65% and mild diastolic dysfunction, and comatose and tests which showed mild obstructive disease. He also has a history of pulmonary embolism and he had a negative D-dimer test.  He has had no change in his symptoms.  His other problems include hypertension hyperlipidemia.  Current Medications (verified): 1)  Micardis 40 Mg  Tabs (Telmisartan) .... Take 1 Tablet By Mouth Once A Day 2)  Warfarin Sodium 5 Mg  Tabs (Warfarin Sodium) .... Take 1 Tablet By Mouth Once A Day 3)  Lovastatin 20 Mg  Tabs (Lovastatin) .... Take 1 Tablet By Mouth Once A Day 4)  Bayer Aspirin Ec Low Dose 81 Mg  Tbec (Aspirin) .... Take 1 Tablet By Mouth Once A Day 5)  Metoprolol Tartrate 50 Mg  Tabs (Metoprolol Tartrate) .... Take 1 Tablet By Mouth Once A Day 6)  Tylenol 325 Mg  Tabs (Acetaminophen) .... Per Bottle Prn 7)  Alka-Seltzer Plus Cold/sinus 30-325 Mg  Caps (Pseudoephedrine-Apap) .... Per Container Prn 8)  Niacin 500 Mg Tabs (Niacin) .Marland Kitchen.. 1 Tab Two Times A Day  Allergies (verified): 1)  ! Penicillin 2)  ! * Contrast Dye  Past History:  Past Medical History: Reviewed history from 03/20/2009 and no changes required. Current Problems:  CAD (ICD-414.00) TACHYCARDIA (ICD-785.0) HYPERLIPIDEMIA (ICD-272.4) HYPERTENSION (ICD-401.9) CHEST PAIN UNSPECIFIED (ICD-786.50) PULMONARY EMBOLISM (ICD-415.19) DIZZINESS  (ICD-780.4) COPD (ICD-496) Hx of GERD (ICD-530.81) DYSPNEA (ICD-786.05) Hx of CHOKING (ICD-933.1) 2. Hypertension. 3. Hyperlipidemia. 4. History of nonobstructive coronary artery disease. 5. History of sinus tachycardia. 6. Status post programmed to DDD pacemaker implantation in 1991 for     atrioventricular block, not pacer dependent. 7. Chronic obstructive pulmonary disease. 8. Gastroesophageal reflux disease.     Review of Systems       ROS is negative except as outlined in HPI.   Vital Signs:  Patient profile:   77 year old male Height:      60 inches Weight:      168 pounds BMI:     32.93 Pulse rate:   71 / minute BP sitting:   107 / 60 Cuff size:   regular  Vitals Entered By: Burnett Kanaris, CNA (November 22, 2009 9:42 AM)  Physical Exam  Additional Exam:  Gen. Well-nourished, in no distress   Neck: No JVD, thyroid not enlarged, no carotid bruits Lungs: No tachypnea, clear without rales, rhonchi or wheezes Cardiovascular: Rhythm regular, PMI not displaced,  heart sounds  normal, no murmurs or gallops, no peripheral edema, pulses normal in all 4 extremities. Abdomen: BS normal, abdomen soft and non-tender without masses or organomegaly, no hepatosplenomegaly. MS: No deformities, no cyanosis or clubbing   Neuro:  No focal sns   Skin:  no lesions    PPM Specifications Following MD:  Everardo Beals. Juanda Chance, MD     PPM Vendor:  [employer] Jude     PPM Model  Number:  2016T     PPM Serial Number:  [id number] PPM DOI:  10/11/1989     PPM Implanting MD:  Everardo Beals. Juanda Chance, MD  Lead 1    Location: RA     DOI: 10/11/1989     Model #: 1028T     Serial #: [id number]     Status: active Lead 2    Location: RV     DOI: 10/11/1989     Model #: 1216T     Serial #: [id number]     Status: active   Indications:  COMPLETE HEART BLOCK SYNCOPE   PPM Follow Up Pacer Dependent:  No      Parameters Mode:  DDD     Lower Rate Limit:  55     Upper Rate Limit:  110 Paced AV Delay:  300     Impression &  Recommendations:  Problem # 1:  DYSPNEA (ICD-786.05)  He had an extensive workup as described in history of present illness. It appears this is most probably due to mild COPD. His updated medication list for this problem includes:    Micardis 40 Mg Tabs (Telmisartan) .Marland Kitchen... Take 1 tablet by mouth once a day    Bayer Aspirin Ec Low Dose 81 Mg Tbec (Aspirin) .Marland Kitchen... Take 1 tablet by mouth once a day    Metoprolol Tartrate 50 Mg Tabs (Metoprolol tartrate) .Marland Kitchen... Take 1 tablet by mouth once a day  Orders: Pacer (Pacer) TLB-BMP (Basic Metabolic Panel-BMET) (80048-METABOL) TLB-CBC Platelet - w/Differential (85025-CBCD) TLB-PT (Protime) (85610-PTP) TLB-PTT (85730-PTTL)  Problem # 2:  PACEMAKER (ICD-V45.Marland Kitchen01) He is at [facility] and we will plan generator replacement next week. He is on Coumadin and his INR was 3.3  2 days ago. We will hold his Coumadin Saturday and Sunday and decrease his dose from 5 mg to 2.5 mg thereafter until we do the procedure on Tuesday. He was instructed thereafter to take 5 mg for 4 days and 2.5 mg on the fifth day.  Problem # 3:  HYPERTENSION (ICD-401.9) Tis controlled on current medications. His updated medication list for this problem includes:    Micardis 40 Mg Tabs (Telmisartan) .Marland Kitchen... Take 1 tablet by mouth once a day    Bayer Aspirin Ec Low Dose 81 Mg Tbec (Aspirin) .Marland Kitchen... Take 1 tablet by mouth once a day    Metoprolol Tartrate 50 Mg Tabs (Metoprolol tartrate) .Marland Kitchen... Take 1 tablet by mouth once a day  His updated medication list for this problem includes:    Micardis 40 Mg Tabs (Telmisartan) .Marland Kitchen... Take 1 tablet by mouth once a day    Bayer Aspirin Ec Low Dose 81 Mg Tbec (Aspirin) .Marland Kitchen... Take 1 tablet by mouth once a day    Metoprolol Tartrate 50 Mg Tabs (Metoprolol tartrate) .Marland Kitchen... Take 1 tablet by mouth once a day  Patient Instructions: 1)  Your physician recommends that you have lab work today: bmet/cbc/pt/ptt (786.09;426.0;402.10). 2)  You have been scheduled for a  pacemaker generator change. See instruction sheet.  3)  Your physician recommends that you continue on your current medications as directed. Please refer to the Current Medication list given to you today.

## 2010-04-22 NOTE — Cardiovascular Report (Signed)
Summary: TTM  TTM   Imported By: Roderic Ovens 07/24/2009 15:13:04  _____________________________________________________________________  External Attachment:    Type:   Image     Comment:   External Document

## 2010-04-22 NOTE — Progress Notes (Signed)
Summary: talk to nurse    Phone Note Call from Patient Call back at Home Phone [phone]   Caller: Spouse Summary of Call: pt needs a script once a year to call for his pace maker Initial call taken by: Edman Circle,  April 10, 2009 10:28 AM  Follow-up for Phone Call        I spoke with the pt's wife. Per Mrs. Woolford, they have received a letter from the company that they need to have a RX renewed for the pt to have device checks yearly (remote). As of 05/01/09 the pt will be suspended from these services. I will discuss with the Device nurses and call the pt's wife back. Sherri Rad, RN, BSN  April 10, 2009 12:18 PM  Per Nickolas Madrid RN- the pt's order has been sent back in today and the pt should be taken care of. Follow-up by: Sherri Rad, RN, BSN,  April 10, 2009 2:08 PM

## 2010-04-22 NOTE — Progress Notes (Signed)
Summary: sob talk to nurse   Phone Note Call from Patient Call back at Home Phone [phone]   Caller: Patient Reason for Call: Talk to Nurse Summary of Call: pt having SOB, some numbness in feet and legs. No chest pain. left arm hurts sometimes Initial call taken by: Edman Circle,  October 22, 2009 3:23 PM  Follow-up for Phone Call        I called and spoke with the pt. He states he has had increasing SOB over the past 3 weeks. He has noticed that his hands and feet will go numb. He has had some edema to his feet, but on reddness. He does complain of cramping. He has occasional CP and some left neck and arm pain. His SOB is worse when he walks to the mailbox. He states that he has been checking his weight at home and he is down about 6-7 pounds over the past 2 months. He spends a lot of time outside, but has been drinking water and gatorade. His urination is ok when he drinks fluids. He states he has been on Spiriva, but this does not really help. I will review with Dr. Juanda Chance. Follow-up by: Sherri Rad, RN, BSN,  October 22, 2009 4:03 PM  Additional Follow-up for Phone Call Additional follow up Details #1::        Per Dr. Juanda Chance, bring pt in for an office visit this week. OK to add on thurs 10/24/09. The pt. is aware and agreeable. Additional Follow-up by: Sherri Rad, RN, BSN,  October 22, 2009 4:16 PM

## 2010-04-22 NOTE — Assessment & Plan Note (Signed)
Summary: eph  Medications Added CALTRATE 600+D PLUS 600-400 MG-UNIT TABS (CALCIUM CARBONATE-VIT D-MIN) Take 1 tablet by mouth two times a day LOSARTAN POTASSIUM 50 MG TABS (LOSARTAN POTASSIUM) Take 1 tablet by mouth once a day        Visit Type:  Post-hospital Primary Provider:  Dr Ishmael Holter garden  CC:  No cardiac complains.  History of Present Illness: [name] is 77 years old and return for a followup visit after his recent generator change. He had a DDD pacemaker implanted in 1991 for AV block. He recently reached ERI and underwent a generator change without problems.  Prior to his generator change we evaluate him for recent increased shortness of breath. His chest x-ray suggested COPD. His Myoview scan was negative. His echocardiogram showed an ejection fraction of 60%. Hip ulnar function tests which showed mild COPD. He has a history of a pulmonary embolus but had a negative d-dimer.  Current Medications (verified): 1)  Warfarin Sodium 5 Mg  Tabs (Warfarin Sodium) .... Take 1 Tablet By Mouth Once A Day 2)  Lovastatin 20 Mg  Tabs (Lovastatin) .... Take 1 Tablet By Mouth Once A Day 3)  Bayer Aspirin Ec Low Dose 81 Mg  Tbec (Aspirin) .... Take 1 Tablet By Mouth Once A Day 4)  Metoprolol Tartrate 50 Mg  Tabs (Metoprolol Tartrate) .... Take 1 Tablet By Mouth Once A Day 5)  Tylenol 325 Mg  Tabs (Acetaminophen) .... Per Bottle Prn 6)  Alka-Seltzer Plus Cold/sinus 30-325 Mg  Caps (Pseudoephedrine-Apap) .... Per Container Prn 7)  Niacin 500 Mg Tabs (Niacin) .Marland Kitchen.. 1 Tab Two Times A Day 8)  Caltrate 600+d Plus 600-400 Mg-Unit Tabs (Calcium Carbonate-Vit D-Min) .... Take 1 Tablet By Mouth Two Times A Day 9)  Losartan Potassium 50 Mg Tabs (Losartan Potassium) .... Take 1 Tablet By Mouth Once A Day  Allergies: 1)  ! Penicillin 2)  ! * Contrast Dye  Past History:  Past Medical History: Reviewed history from 03/20/2009 and no changes required. Current Problems:  CAD  (ICD-414.00) TACHYCARDIA (ICD-785.0) HYPERLIPIDEMIA (ICD-272.4) HYPERTENSION (ICD-401.9) CHEST PAIN UNSPECIFIED (ICD-786.50) PULMONARY EMBOLISM (ICD-415.19) DIZZINESS (ICD-780.4) COPD (ICD-496) Hx of GERD (ICD-530.81) DYSPNEA (ICD-786.05) Hx of CHOKING (ICD-933.1) 2. Hypertension. 3. Hyperlipidemia. 4. History of nonobstructive coronary artery disease. 5. History of sinus tachycardia. 6. Status post programmed to DDD pacemaker implantation in 1991 for     atrioventricular block, not pacer dependent. 7. Chronic obstructive pulmonary disease. 8. Gastroesophageal reflux disease.     Review of Systems       ROS is negative except as outlined in HPI.   Vital Signs:  Patient profile:   77 year old male Height:      60 inches Weight:      168 pounds BMI:     32.93 Pulse rate:   55 / minute Pulse rhythm:   irregular Resp:     18 per minute BP sitting:   108 / 60  (left arm) Cuff size:   large  Vitals Entered By: Kem Parkinson (December 11, 2009 8:41 AM)  Physical Exam  Additional Exam:  Gen. Well-nourished, in no distress   Neck: No JVD, thyroid not enlarged, no carotid bruits Lungs: No tachypnea, clear without rales, rhonchi or wheezes Cardiovascular: Rhythm regular, PMI not displaced,  heart sounds  normal, no murmurs or gallops, no peripheral edema, pulses normal in all 4 extremities. Abdomen: BS normal, abdomen soft and non-tender without masses or organomegaly, no hepatosplenomegaly. MS: No deformities, no cyanosis  or clubbing   Neuro:  No focal sns   Skin:  no lesions    PPM Specifications Following MD:  Everardo Beals. Juanda Chance, MD     PPM Vendor:  St Jude     PPM Model Number:  [id number]     PPM Serial Number:  [id number] PPM DOI:  11/26/2009     PPM Implanting MD:  Everardo Beals. Juanda Chance, MD  Lead 1    Location: RA     DOI: 10/11/1989     Model #: [id number]     Serial #: [id number]     Status: active Lead 2    Location: RV     DOI: 10/11/1989     Model #: [id number]     Serial #: [id number]      Status: active  Magnet Response Rate:  BOL 100 ERI 85  Indications:  COMPLETE HEART BLOCK SYNCOPE  Explantation Comments:  11/26/09 St. Jude [id number] explanted  PPM Follow Up Battery Voltage:  3.04 V     Battery Est. Longevity:  3.5-11.7 YRS     Pacer Dependent:  No       PPM Device Measurements Atrium  Amplitude: 4.3 mV, Impedance: 440 ohms, Threshold: 1.5 V at 0.6 msec Right Ventricle  Amplitude: 5.7 mV, Impedance: 290 ohms, Threshold: 3.00 V at 1.00 msec  Episodes MS Episodes:  3     Percent Mode Switch:  <1%     Ventricular High Rate:  0     Atrial Pacing:  9.9%     Ventricular Pacing:  <1%  Parameters Mode:  DDD     Lower Rate Limit:  55     Upper Rate Limit:  110 Paced AV Delay:  350     Sensed AV Delay:  325 Next Remote Date:  03/13/2010     Next Cardiology Appt Due:  05/26/2010 Tech Comments:  WOUND CHECK---STERI STRIPS REMOVED.  NO REDNESS OR SWELLING AT SITE.  NORMAL DEVICE FUNCTION.  RV THRESHOLD 3.00 @ 1.72ms--RV OUTPUT 4.0 PER DR BRODIE. ROV IN DEC W/BB(CHECK PPM 3 MTHS AFTER CHANGE OUT).  ROV IN 6 MTHS W/JA. Vella Kohler  December 11, 2009 1:49 PM  Impression & Recommendations:  Problem # 1:  PACEMAKER (ICD-V45.Marland Kitchen01) He had a recent generator change or ERI. His wound looks good today. He has a high threshold on the ventricular lead but we programmed a long AV delay and he paces the ventricle very little.  Problem # 2:  COPD (ICD-496) He has mild copd.  Otherwise neg dyspnea w/u.  Other Orders: EKG w/ Interpretation (93000)  Patient Instructions: 1)  Your physician recommends that you continue on your current medications as directed. Please refer to the Current Medication list given to you today. 2)  Your physician wants you to follow-up in: 6 months with Dr. Johney Frame.  You will receive a reminder letter in the mail two months in advance. If you don't receive a letter, please call our office to schedule the follow-up appointment.

## 2010-04-24 NOTE — Cardiovascular Report (Signed)
Summary: Office Visit Remote   Office Visit Remote   Imported By: Roderic Ovens 04/04/2010 10:49:08  _____________________________________________________________________  External Attachment:    Type:   Image     Comment:   External Document

## 2010-04-24 NOTE — Letter (Signed)
Summary: Remote Device Check  [facility] [employer], Main Office  [address]   [place], [place] [address]   Phone: [phone]  Fax: [phone]     April 02, 2010 MRN: [id number]   [facility] [address] [place], [place]  [address]   Dear Mr. Sosinski,   Your remote transmission was recieved and reviewed by your physician.  All diagnostics were within normal limits for you.   __X____Your next office visit is scheduled for:  05-26-10 @ 900am with [name].   Sincerely,  Vella Kohler

## 2010-04-24 NOTE — Letter (Signed)
Summary: Remote Device Check  [facility] [employer], Main Office  [address]   [place], [place] [address]   Phone: [phone]  Fax: [phone]     April 02, 2010 MRN: [id number]   [facility] [address] [place], [place]  [address]   Dear Mr. Siegmann,   Your remote transmission was recieved and reviewed by your physician.  All diagnostics were within normal limits for you.  _____Your next transmission is scheduled for:                       .  Please transmit at any time this day.  If you have a wireless device your transmission will be sent automatically.  ______Your next office visit is scheduled for:                              . Please call our office to schedule an appointment.    Sincerely,  Vella Kohler

## 2010-05-12 ENCOUNTER — Emergency Department (HOSPITAL_COMMUNITY): Payer: Medicare Other

## 2010-05-12 ENCOUNTER — Telehealth: Payer: Self-pay | Admitting: Internal Medicine

## 2010-05-12 ENCOUNTER — Emergency Department (HOSPITAL_COMMUNITY)
Admission: EM | Admit: 2010-05-12 | Discharge: 2010-05-12 | Disposition: A | Discharge status: Home / Self Care | Payer: Medicare Other | Attending: Emergency Medicine | Admitting: Emergency Medicine

## 2010-05-12 DIAGNOSIS — R5383 Other fatigue: Secondary | ICD-10-CM | POA: Insufficient documentation

## 2010-05-12 DIAGNOSIS — Z79899 Other long term (current) drug therapy: Secondary | ICD-10-CM | POA: Insufficient documentation

## 2010-05-12 DIAGNOSIS — Z7901 Long term (current) use of anticoagulants: Secondary | ICD-10-CM | POA: Insufficient documentation

## 2010-05-12 DIAGNOSIS — R079 Chest pain, unspecified: Secondary | ICD-10-CM | POA: Insufficient documentation

## 2010-05-12 DIAGNOSIS — E7800 Pure hypercholesterolemia, unspecified: Secondary | ICD-10-CM | POA: Insufficient documentation

## 2010-05-12 DIAGNOSIS — R42 Dizziness and giddiness: Secondary | ICD-10-CM | POA: Insufficient documentation

## 2010-05-12 DIAGNOSIS — R5381 Other malaise: Secondary | ICD-10-CM | POA: Insufficient documentation

## 2010-05-12 LAB — COMPREHENSIVE METABOLIC PANEL
Albumin: 3.9 g/dL (ref 3.5–5.2)
BUN: 13 mg/dL (ref 6–23)
Creatinine, Ser: 1.1 mg/dL (ref 0.4–1.5)
Potassium: 3.9 mEq/L (ref 3.5–5.1)
Total Protein: 6.9 g/dL (ref 6.0–8.3)

## 2010-05-12 LAB — POCT I-STAT, CHEM 8
Chloride: 104 mEq/L (ref 96–112)
Glucose, Bld: 74 mg/dL (ref 70–99)
HCT: 42 % (ref 39.0–52.0)
Hemoglobin: 14.3 g/dL (ref 13.0–17.0)
Potassium: 4 mEq/L (ref 3.5–5.1)
Sodium: 142 mEq/L (ref 135–145)

## 2010-05-12 LAB — POCT CARDIAC MARKERS
CKMB, poc: <1 ng/mL — ABNORMAL LOW (ref 1.0–8.0)
CKMB, poc: 1.1 ng/mL (ref 1.0–8.0)
Myoglobin, poc: 102 ng/mL (ref 12–200)
Myoglobin, poc: 80.1 ng/mL (ref 12–200)

## 2010-05-12 LAB — CBC
HCT: 41.4 % (ref 39.0–52.0)
Hemoglobin: 14.2 g/dL (ref 13.0–17.0)
MCHC: 34.3 g/dL (ref 30.0–36.0)
MCV: 89.4 fL (ref 78.0–100.0)

## 2010-05-12 LAB — DIFFERENTIAL
Basophils Absolute: 0.1 10*3/uL (ref 0.0–0.1)
Lymphocytes Relative: 25 % (ref 12–46)
Lymphs Abs: 2 10*3/uL (ref 0.7–4.0)
Monocytes Absolute: 0.7 10*3/uL (ref 0.1–1.0)
Monocytes Relative: 9 % (ref 3–12)
Neutro Abs: 5.1 10*3/uL (ref 1.7–7.7)

## 2010-05-12 LAB — PROTIME-INR
INR: 1.87 — ABNORMAL HIGH (ref 0.00–1.49)
Prothrombin Time: 21.7 seconds — ABNORMAL HIGH (ref 11.6–15.2)

## 2010-05-12 LAB — D-DIMER, QUANTITATIVE: D-Dimer, Quant: 0.39 ug/mL-FEU (ref 0.00–0.48)

## 2010-05-20 NOTE — Progress Notes (Signed)
Summary: chest pain/arm pain/sob  Phone Note Call from Patient   Caller: Patient [phone] Reason for Call: Talk to Nurse Summary of Call: pt having chest pain off and on x 1 1/2 days, left arm pain for 2 days, sob for 1 week- Initial call taken by: Glynda Jaeger,  May 12, 2010 8:08 AM  Follow-up for Phone Call        Patient has a history of CAD and has been experiencing dyspnea for about a week with left arm pain. He is also c/o  CP. He does not have SL Ntg. I advised him to go to the ER for evaluation. Spoke with Lenard Galloway, PA and he is aware. I will also notify the ER. Whitney Maeola Sarah RN  May 12, 2010 8:58 AM  Follow-up by: Whitney Maeola Sarah RN,  May 12, 2010 8:58 AM

## 2010-05-23 ENCOUNTER — Encounter: Payer: Self-pay | Admitting: Internal Medicine

## 2010-05-26 ENCOUNTER — Encounter: Payer: Self-pay | Admitting: Internal Medicine

## 2010-05-26 ENCOUNTER — Encounter (INDEPENDENT_AMBULATORY_CARE_PROVIDER_SITE_OTHER): Payer: Medicare Other | Admitting: Internal Medicine

## 2010-05-26 DIAGNOSIS — I10 Essential (primary) hypertension: Secondary | ICD-10-CM

## 2010-05-26 DIAGNOSIS — I441 Atrioventricular block, second degree: Secondary | ICD-10-CM | POA: Insufficient documentation

## 2010-06-02 NOTE — Consult Note (Signed)
NAMEJERMEL, [name]                ACCOUNT NO.:  [id number]  MEDICAL RECORD NO.:  [id number]           PATIENT TYPE:  E  LOCATION:  [facility]                         FACILITY:  [facility]  PHYSICIAN:  Cassell Clement, M.D. DATE OF BIRTH:  [birth year]  DATE OF CONSULTATION: DATE OF DISCHARGE:  05/12/2010                                CONSULTATION   PRIMARY CARE PHYSICIAN:  Dr. Jeannetta Nap in [place].  PRIMARY CARDIOLOGIST:  Dr. Johney Frame with [facility].  CHIEF COMPLAINT:  Chest pain.  HISTORY OF PRESENTING ILLNESS:  This is a 77 year old man with history of nonobstructive coronary artery disease shown by catheterization in 2001, hypertension, hyperlipidemia, history of pulmonary embolism, on Coumadin and type 1 AV block with ICD, comes to the ER with chest pain. Going on since 2 days.  No exacerbating or relieving factor.  Pain comes and goes and it is fleeting kind of pain.  It is sharp, left-sided and felt in the left arm as well.  He has not tried any nitroglycerin or aspirin for the chest pain.  It is associated with shortness of breath sometimes, but he says he has COPD and is short of breath at baseline usually.  He has headache and neck pain which is there for 1-2 weeks now.  The patient is chest pain free at this time and has not received any medications in the emergency room.  He says he had similar kind of chest pains before as well but then they went away until they started coming back again in last 2 days.  He has an appointment to see Dr. Johney Frame on May 26, 2010.  He is compliant with all of his medications and visits his primary care physician every month.  PAST MEDICAL HISTORY: 1. Coronary artery disease with nonobstructive coronaries showed in     cath in 2001, the details of that catheterization are as follows.     Left heart catheterization showed a normal left main proximal LAD     obstruction of 25% and mid LAD obstruction of 40%, mild luminal  irregularities in the circumflex, otherwise normal, dominant right     coronary with some mild luminal irregularities.  His last 2-D echo     was in August 2011 which showed normal EF of 60-65% without any     wall motion abnormality. 2. History of AV block status post pacemaker implanted in 1999 which     was later replaced in September 2011 for ERI. 3. Hypertension. 4. Hyperlipidemia. 5. COPD. 6. History of sinus tachycardia. 7. GERD. 8. History of prostate cancer status post cryotherapy, followed by Dr.     Patsi Sears. 9. Bilateral internal carotid artery pseudoaneurysm, followed by Dr.     Arbie Cookey once a year. 10.History of pulmonary embolism, on Coumadin therapy.  HOME MEDICATIONS: 1. Coumadin 5 mg once a day followed by Dr. Jeannetta Nap. 2. Lovastatin 20 mg daily. 3. Aspirin 81 mg daily. 4. Metoprolol 50 mg daily. 5. Niacin 500 mg twice a day. 6. Losartan 50 mg daily.  ALLERGIES:  The patient is allergic to CONTRAST MEDIA which causes mild shortness of breath and  the patient is able to take contrast with premedication with BENADRYL and PREDNISONE and the patient is allergic to PENICILLIN which causes rash.  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  He quit smoking 5 years ago but continues to chew tobacco on daily basis.  He occasionally drinks alcohol once or twice a week.  Denies any illegal drug abuse.  He says he walks a lot every day as a part of his work for exercising.  FAMILY HISTORY:  Not significant for coronary artery disease or cancers in the family.  REVIEW OF SYSTEMS:  Otherwise negative except as per HPI.  He is full cord at this time.  PHYSICAL EXAMINATION:  VITAL SIGNS:  In the ER; temperature 98.2, pulse 55-60 per minute, respiratory rate 14 per minute, blood pressure 130/70, oxygen saturation 100% on 2 L. GENERAL:  The patient is awake, lying in bed, no acute distress. HEENT:   Pupils are equal, reactive to light.  Extraocular muscles intact. NECK:  Supple.  No JVD. ABDOMEN:  Soft, nontender, nondistended.  Normal bowel sounds. CHEST:  S1 and S2 normal.  No murmurs, no soreness on chest palpation. LUNGS:  Clear to auscultation bilaterally. SKIN:  No rashes.  The patient has many cream-colored struck-on- appearing papules on his back and lemon-sized fluctuant compressible cyst on his right upper back which he says has been evaluated by a dermatologist and is benign.  MUSCULOSKELETAL:  No joint deformity or effusions. NEUROLOGIC:  Alert, oriented x3.  Cranial nerves II-XII intact. Sensations and motor strength grossly intact.  Gait normal.  RADIOLOGY:  The patient's two-view chest x-ray shows chronic bronchitis changes with no evidence of any acute pulmonary disease.  EKG shows a rate of 55 per minute with sinus bradycardia and normal axis.  He has first AV block with pacer spikes.  No new changes as per his last EKG on April 02, 2008.  LABORATORY FINDINGS:  Hemoglobin 14.2, white count 8.1, platelet count 240, sodium 141, potassium 3.9, chloride 106, bicarb 30, BUN 13, creatinine 1.1, glucose 70, total bilirubin 1.1, alkaline phosphatase 81, AST 26, ALT 23, albumin 3.9, calcium 10.9.  PT 21.7 with an INR of 1.87.  Point-of-care cardiac markers negative for any evidence of ischemia.  ASSESSMENT AND PLAN: 1. Chest pain.  The patient has very atypical presentation of chest     pain for any evidence of acute coronary artery syndrome and his EKG     is unchanged and his point-of-care cardiac markers negative at this     time.  He had nonobstructive coronary artery disease demonstrated     in 2001 and he is on optimal medical management for that.  We will     continue that management and we will give him nitroglycerin     prescription at this time.  The patient does not require admission     for his chest pain.  Given his history of pulmonary embolism and      subtherapeutic INR, we will also get a D-dimer and if it is     elevated, we will follow this up with CT angio after premedicating     him with prednisone and Benadryl for his contrast media allergy.     If all these tests are negative, we will discharge the patient from     the emergency room with his current home medication including a     prescription for nitroglycerin and  an extra dose of Coumadin at 2.5     mg.  He will follow up with Dr. Johney Frame on May 26, 2010, and will     ask him to see his primary care physician in this week or early     next week for INR     check.  The patient voiced understanding of this plan and he will     contact the [facility] or come to the emergency room     in case he experiences further chest pain not relieved by     nitroglycerin.  This plan has been discussed with Dr. Patty Sermons.     Bethel Born, MD   ______________________________ Cassell Clement, M.D.    MD/MEDQ  D:  05/12/2010  T:  05/13/2010  Job:  [id number]  cc:   Cassell Clement, M.D. Dr. Johney Frame Dr. Jeannetta Nap  Electronically Signed by Bethel Born  on 06/02/2010 06:45:59 AM Electronically Signed by Cassell Clement M.D. on 06/02/2010 09:18:31 PM

## 2010-06-03 NOTE — Assessment & Plan Note (Signed)
Summary: FOLLOW UP/PER CKOUT 12/11/09 FORMER BRODIE PT HAS PPM ST ...   Visit Type:  Follow-up Primary Provider:  Dr Ishmael Holter garden   History of Present Illness: The patient presents today for routine electrophysiology followup. He reports doing very well since last being seen by Dr Juanda Chance.  He was seen in the ER 2/12 with sharp fleeting chest pain.  This has resolved.  He remains active.   The patient denies symptoms of palpitations, exertional chest pain, orthopnea, PND, lower extremity edema, dizziness, presyncope, syncope, or neurologic sequela. He has chronic stable SOB.  The patient is tolerating medications without difficulties and is otherwise without complaint today.   Current Medications (verified): 1)  Warfarin Sodium 5 Mg  Tabs (Warfarin Sodium) .... Take 1 Tablet By Mouth Once A Day 2)  Lovastatin 20 Mg  Tabs (Lovastatin) .... Take 1 Tablet By Mouth Once A Day 3)  Bayer Aspirin Ec Low Dose 81 Mg  Tbec (Aspirin) .... Take 1 Tablet By Mouth Once A Day 4)  Metoprolol Tartrate 50 Mg  Tabs (Metoprolol Tartrate) .... Take 1 Tablet By Mouth Once A Day 5)  Tylenol 325 Mg  Tabs (Acetaminophen) .... Per Bottle Prn 6)  Alka-Seltzer Plus Cold/sinus 30-325 Mg  Caps (Pseudoephedrine-Apap) .... Per Container Prn 7)  Caltrate 600+d Plus 600-400 Mg-Unit Tabs (Calcium Carbonate-Vit D-Min) .... Take 1 Tablet By Mouth Two Times A Day 8)  Losartan Potassium 50 Mg Tabs (Losartan Potassium) .... Take 1 Tablet By Mouth Once A Day  Allergies: 1)  ! Penicillin 2)  ! * Contrast Dye  Past History:  Past Medical History: CAD (ICD-414.00) s/p cath 2001, myoview nl 8/11 HYPERLIPIDEMIA (ICD-272.4) HYPERTENSION (ICD-401.9) PULMONARY EMBOLISM (ICD-415.19) DIZZINESS (ICD-780.4) COPD (ICD-496) Hx of GERD (ICD-530.81) DYSPNEA (ICD-786.05) Hx of CHOKING (ICD-933.1) Status post programmed to DDD pacemaker implantation in 1991 for atrioventricular block, not pacer dependent.     Past  Surgical History:  Status post Paragon II DDD pacemaker implanted in 1991 for AV block  with previously moderately high thresholds in the ventricular  leads. generator change by Dr Lillia Mountain 9/11  Cystoscopy, retrograde, left double-J stent.  Laparoscopic cholecystectomy with intraoperative  cholangiogram.  Social History:  Married and lives in [place] with his wife.  No  excessive use of alcohol.  Does not presently smoke but chews tobacco  Vital Signs:  Patient profile:   77 year old male Height:      60 inches Weight:      172 pounds BMI:     33.71 Pulse rate:   60 / minute BP sitting:   146 / 70  (left arm)  Vitals Entered By: Laurance Flatten CMA (May 26, 2010 9:09 AM)  Physical Exam  General:  elderly male, NAD Head:  normocephalic and atraumatic Eyes:  PERRLA/EOM intact; conjunctiva and lids normal. Mouth:  Teeth, gums and palate normal. Oral mucosa normal. Neck:  supple Chest Wall:  R sided PPM pocket is well healed Lungs:  prolonged expiratory phase.  no wheezes today Heart:  RRR, no m/r/g Abdomen:  Bowel sounds positive; abdomen soft and non-tender without masses, organomegaly, or hernias noted. No hepatosplenomegaly. Msk:  Back normal, normal gait. Muscle strength and tone normal. Extremities:  No clubbing or cyanosis. Neurologic:  Alert and oriented x 3.   PPM Specifications Following MD:  Everardo Beals. Juanda Chance, MD     PPM Vendor:  [employer] Jude     PPM Model Number:  [id number]     PPM Serial  Number:  [id number] PPM DOI:  11/26/2009     PPM Implanting MD:  Everardo Beals. Juanda Chance, MD  Lead 1    Location: RA     DOI: 10/11/1989     Model #: 1028T     Serial #: [id number]     Status: active Lead 2    Location: RV     DOI: 10/11/1989     Model #: 1216T     Serial #: [id number]     Status: active  Magnet Response Rate:  BOL 100 ERI 85  Indications:  COMPLETE HEART BLOCK SYNCOPE  Explantation Comments:  11/26/09 [employer] Jude [phone] explanted  PPM Follow Up Pacer Dependent:  No       Parameters Mode:  DDD     Lower Rate Limit:  55     Upper Rate Limit:  110 Paced AV Delay:  350     Sensed AV Delay:  325 MD Comments:  see scanned report from [facility]  Impression & Recommendations:  Problem # 1:  OTHER SECOND DEGREE ATRIOVENTRICULAR BLOCK (ICD-426.13) he is s/p PPM 1991 with most recent gen change by Dr Juanda Chance 2011.  He is not pacemaker dependant. His atrial and ventricular thresholds are chronically elevated, though he hoes not frequently pace. See [facility] report  Problem # 2:  CAD (ICD-414.00) nonobstructive CAD by cath 2001 myoview 8/11 normal no ischemic symptoms, though he has rare fleeting chest pains no changes at this time  Problem # 3:  COPD (ICD-496) stable  I have encouraged him to quit chewing tobacco  Problem # 4:  HYPERTENSION (ICD-401.9) above goal, though he reports good BP control at home salt restriction no changes today  Patient Instructions: 1)  Your physician wants you to follow-up in: 12 months and merlin check in june.   You will receive a reminder letter in the mail two months in advance. If you don't receive a letter, please call our office to schedule the follow-up appointment. 2)  Your physician recommends that you continue on your current medications as directed. Please refer to the Current Medication list given to you today.

## 2010-06-05 LAB — PROTIME-INR: Prothrombin Time: 18.4 seconds — ABNORMAL HIGH (ref 11.6–15.2)

## 2010-06-10 NOTE — Cardiovascular Report (Signed)
Summary: Office Visit   Office Visit   Imported By: Roderic Ovens 06/02/2010 14:26:32  _____________________________________________________________________  External Attachment:    Type:   Image     Comment:   External Document

## 2010-07-07 LAB — APTT: aPTT: 34 seconds (ref 24–37)

## 2010-07-07 LAB — POCT I-STAT 4, (NA,K, GLUC, HGB,HCT)
Glucose, Bld: 80 mg/dL (ref 70–99)
HCT: 51 % (ref 39.0–52.0)
Sodium: 143 mEq/L (ref 135–145)

## 2010-07-30 ENCOUNTER — Other Ambulatory Visit: Payer: Self-pay | Admitting: Vascular Surgery

## 2010-07-30 DIAGNOSIS — I720 Aneurysm of carotid artery: Secondary | ICD-10-CM

## 2010-08-05 NOTE — Assessment & Plan Note (Signed)
[facility]                            CARDIOLOGY OFFICE NOTE   NAME:Harpenau, KATHRYN LINAREZ                       MRN:          [id number]  DATE:05/31/2007                            DOB:          1932-05-04    PRIMARY CARE PHYSICIAN:  Dr. Windle Guard.   PAST MEDICAL HISTORY:  Mr. Kneale is 77 years old and returns for follow-  up evaluation and management of shortness of breath.  I saw him in  February for pacemaker check and at that time, his symptoms of shortness  of breath and tachycardia.  I was not sure what this was due to and we  ordered multiple tests including a CBC.  Renal profile, TSH and BMP,  which were normal.   We started him on metoprolol and, since that time, he has done better.   His past medical history significant for pulmonary embolism for which he  takes Coumadin.  He has a history of hypertension, COPD, and reflux.  His current medications include aspirin, Micardis, Coumadin, lovastatin,  Prilosec, metoprolol XL 100 mg 1/2 tablet daily, and metoprolol tartrate  with his Lopressor 25 mg b.i.d.   EXAMINATION:  Today the blood pressure was 116/89, pulse 74 and regular.  There was no venous distension.  Carotid pulses were full without  bruits.  CHEST:  Was clear.  HEART:  Rhythm was regular.  No murmurs or gallops.  ABDOMEN:  Soft no organomegaly.  No peripheral edema.   IMPRESSION:  1. Sinus tachycardia and shortness of breath of uncertain etiology,      resolved.  2. 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  3. Nonobstructive coronary artery disease.  4. Chronic obstructive pulmonary disease with possible reflux induced      asthma.  5. History of pulmonary embolism.  6. Hypertension.   RECOMMENDATIONS:  I am not sure regarding the etiology of Mr. Faughn  shortness of breath but appears better.  Somewhat better related to his  chronic  pulmonary disease.  He is currently taking long-acting Toprol  XL due  to half a day and short-acting on Lopressor 25 b.i.d.  The long-  acting is not available.  We will consolidate this into the Lopressor 50  b.i.d.  I will plan to see him back for a follow-up visit in February  for pacemaker check.     [name], MD, [facility]  Electronically Signed   BRB/MedQ  DD: 05/31/2007  DT: 06/01/2007  Job #: [id number]

## 2010-08-05 NOTE — Assessment & Plan Note (Signed)
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  He denies  any new medical difficulties and specifically denies any neurologic  deficits.  He has had no cardiac difficulties or other major medical  problems since my last visit with him 2 years ago.  He continues to be a  nonsmoker having quit in 2002.  Does not drink alcohol.   PHYSICAL EXAM:  Well-developed, well-nourished white male appearing  stated age of 52.  Blood pressure 129/82, pulse 86, respirations 16.  His radial pulses are 2+ bilaterally.  He does not have carotid bruits  bilaterally.  He is grossly intact neurologically.   He underwent a CT angiogram today and I have reviewed this and compared  it to a study from 2 years ago in 2007.  This does show extreme  tortuosity of his internal carotid arteries from the bifurcation to the  skull base.  He does have aneurysmal changes at the skull base  bilaterally.  This has not changed since his last CT.  I have reassured  the patient and his wife regarding this and plan to see him again in 2  years with repeat CT scan.  He will notify us should he develop any  neurologic deficits.   Larina Earthly, M.D.  Electronically Signed   TFE/MEDQ  D:  08/12/2007  T:  08/16/2007  Job:  [id number]   cc:   Windle Guard, M.D.

## 2010-08-05 NOTE — Assessment & Plan Note (Signed)
[facility]                                 ON-CALL NOTE   NAME:[name], [name]                       MRN:          [id number]  DATE:05/02/2007                            DOB:          1933/02/27    Apparently the patient was seen in the office today and labs were drawn  at 5:01, recorded as stat.   Angie with [facility] called and stated that the WBC was 10.7, calcium  10.9, BNP was less than 30.  He did not have any critical values and TSH  was pending.  I asked her to only call us back if his TSH was critical.  Otherwise, labs will be sent to the office.   PRIMARY CARE PHYSICIAN:  Windle Guard, M.D.   PRIMARY CARDIOLOGIST:  Everardo Beals. Juanda Chance, MD, [facility]      Joellyn Rued, PA-C  Electronically Signed      Madolyn Frieze. Jens Som, MD, [facility]  Electronically Signed   EW/MedQ  DD: 05/02/2007  DT: 05/03/2007  Job #: [id number]

## 2010-08-05 NOTE — Assessment & Plan Note (Signed)
[facility]                            CARDIOLOGY OFFICE NOTE   NAME:[name], RIYAD KEENA                       MRN:          [id number]  DATE:05/02/2007                            DOB:          20-May-1932    PRIMARY CARE PHYSICIAN:  Windle Guard, M.D.   CLINICAL HISTORY:  Mr. Priebe is 77 years old and came in for an  unscheduled visit today because of multiple complaints including  dizziness, shortness of breath, left neck and arm pain, and numbness and  tingling in his legs.  We have been following him for his pacemaker  which was placed in 1991 for AV block.  He said he has had no recent  chest pain, although he has had the neck and arm pain, but it has not  been related to exertion.   PAST MEDICAL HISTORY:  Significant for:  1. Pulmonary embolism for which he takes Coumadin.  2. He also has hypertension.  3. He has COPD and is followed by Dr. Sherene Sires.  4. He is also felt to have reflux with secondary asthma.   CURRENT MEDICATIONS:  Include aspirin, Micardis, Coumadin, lovastatin  and Prilosec.   PHYSICAL EXAMINATION:  VITAL SIGNS:  On examination today, the blood  pressure is 134/89 with pulse of 104 and regular.  NECK:  There was no venous distension.  The carotid pulses were full.  CHEST:  Had decreased breath sounds but no rales or rhonchi.  CARDIAC:  Rhythm was regular.  No murmurs or gallops.  ABDOMEN:  Soft with normal bowel sounds.  There was no  hepatosplenomegaly.  EXTREMITIES:  Peripheral pulses were full and no peripheral edema.   An electrocardiogram showed sinus tachycardia with rate of 105-110.  The  ECG was otherwise normal.  We did not check his thresholds because fast  rate.   IMPRESSION:  1. Sinus tachycardia of uncertain etiology  2. Dizziness and shortness of breath of uncertain etiology.  3. Status post Paragon 2 DDD pacemaker implanted 1991 for AV block      with previous moderately high threshold in the ventricular  lead.  4. Nonobstructive coronary artery disease.  5. History of pulmonary embolism.  6. Hypertension.  7. Chronic obstructive pulmonary disease and possible reflux-induced      asthma.   RECOMMENDATIONS:  Mr. Ledo is feeling bad, and the predominant finding  is sinus tachycardia.  I am not certain of the etiology for his  tachycardia or his other symptoms.  Will plan to start him on Toprol 50  mg a day to treat his blood pressure and fast heart rate.  Will get  laboratory studies including a CBC, BMP, BNP and TSH.  I will have him  return in 4 weeks to follow up on his blood pressure and pulse.  If he  still is having predominant symptoms of shortness of breath, then we may  need to evaluate that further with a chest x-ray, pulmonary function  tests, or possibly a perfusion scan.  I will plan to see him back in 4  weeks.     Smitty Cords  R. Juanda Chance, MD, [facility]  Electronically Signed    BRB/MedQ  DD: 05/02/2007  DT: 05/03/2007  Job #: [id number]   cc:   Windle Guard, M.D.

## 2010-08-05 NOTE — Assessment & Plan Note (Signed)
OFFICE VISIT   [name], SCHARA  DOB:  06-Jan-1933                                       08/16/2009  CHART#:[id number]   The patient presents today for evaluation and followup of his known  bilateral distal internal carotid pseudoaneurysms.  He has been seen on  several occasions in our office with serial CT angiogram followups.  Most recent of this was 2 years ago.  He reports no new major  difficulties.  He does have hypertension and elevated cholesterol and  history of emphysema.  He does have treatment for prostate cancer.   SOCIAL HISTORY:  He is married with one child.  He is retired.  He does  not smoke having quit in 2005.  He does not drink alcohol.   REVIEW OF SYSTEMS:  Is noted as loss of appetite, weight of 176, 6 feet  tall.  Short of breath with exertion, asthma.  Constipation.  No GU  difficulty.  NEUROLOGIC:  Does have dizziness but no focal deficits.  MUSCULOSKELETAL:  Reports joint pain.  PSYCHIATRIC:  No anxiety or depression.  HEENT:  Does report change in eyesight.  HEMATOLOGIC:  Negative.  SKIN:  Negative.   PHYSICAL EXAM:  A well-developed, well-nourished white male appearing  stated age.  Blood pressure is 138/70, pulse 84, respirations 18.  His  carotid arteries are without bruits bilaterally.  HEENT is normal.  Chest is clear bilaterally.  I do not hear carotid bruits.  Musculoskeletal shows no major deformity or cyanosis.  Neurologic no  focal weakness or paresthesias.  Skin without ulcers or rashes.   He did undergo CT angiogram today and I have reviewed this with the  patient and his wife.  This shows no change in the small pseudoaneurysms  at the skull base bilaterally.  I have reassured them with this and  recommended that we see him again in 2 years with repeat CT angiogram.  He understands symptoms of focal carotid disease and will notify us  immediately should this occur.     Larina Earthly, M.D.  Electronically  Signed   TFE/MEDQ  D:  08/16/2009  T:  08/16/2009  Job:  [id number]

## 2010-08-05 NOTE — Assessment & Plan Note (Signed)
[facility]                            CARDIOLOGY OFFICE NOTE   NAME:[name], [name] FRIMPONG                       MRN:          [id number]  DATE:05/11/2007                            DOB:          Jun 14, 1932    The patient's in the [facility] clinic on May 11, 2007 for Dr.  Gala [name].  A patient of Dr. Charlies [name].  Seen for 1-week follow-up.  [name] is a 77 year old married white male patient who saw Dr. Juanda [name]  last week for complaints of dizziness, shortness of breath, left neck  pain and arm pain, and numbness and tingling.  He was found to have  sinus tachycardia of uncertain etiology and blood pressure was up a  little.  Dr. Juanda [name] started him on metoprolol 50 mg daily.  Something  happened with the pharmacy and he ended up with two different  prescriptions of metoprolol and is taking 100 mg one-half q.h.s. and  then taking 25 mg b.i.d. for a total of 100 mg a day.  He still feels  fatigued, tired, little bit dizzy, but his heart rate and blood pressure  are much better.  He does say that he has aneurysm in the base of his  brain has been followed by Dr. Arbie [name] in the past, but he had cancelled  his appointment and had not been seen in follow-up.  I cannot find any  records in our chart stating anything about the aneurysm.  Dr. Juanda [name]  ordered labs last week and all seemed to be in order.  TSH and BNP were  normal.  CBC was normal and slightly elevated white count of 10.7 and  calcium was 10.9.  Other labs were completely normal.  Current  medications, coated aspirin 81 mg a day, Micardis 80 mg one-half daily,  Coumadin as directed, lovastatin 20 mg daily, Prilosec 20 mg daily,  metoprolol ER 100 mg 1 tablet q.h.s. metoprolol tartrate 25 mg b.i.d.   PHYSICAL EXAM:  This is a pleasant 77 year old white male in no acute  distress.  Blood pressure 124/72, pulse 75, weight 178.  He asked me to look in his ears, and they did have cerumen impaction  in  both ears.  Neck is without JVD, HJR bruit or thyroid enlargement.  Lungs are clear anterior posterior lateral.  Decreased breath sounds.  HEART:  Regular rate and rhythm at 75 beats per minute.  Normal S1-S2.  Positive S4.  A 1/6 systolic ejection murmur at the apex.  Abdomen is soft without organomegaly, masses, lesions or abnormal  tenderness.  EXTREMITIES:  Without cyanosis or edema is good distal pulses.   IMPRESSION:  1. Sinus tachycardia and hypertension resolved on 100 mg of      metoprolol.  Will continue this dose.  2. Dizziness, headache with fatigue, question etiology.  I have asked      him to follow up with Dr. Arbie [name] concerning this aneurysm.  3. Status post Paragon II DDD pacemaker implanted in 1991 for AV block      with previously moderately high thresholds in the ventricular  leads.  4. Nonobstructive coronary artery disease.  5. History of pulmonary embolism.  6. Coumadin therapy.  7. Hypertension.  8. Chronic obstructive pulmonary disease and possible reflux induced      asthma.   PLAN:  At this time I have decided to keep the patient on 100 mg of  metoprolol since his heart rate and blood pressure are much better.  I  have asked him to schedule appointment to see Dr. Arbie [name] back concerning  this aneurysm and have asked that notes and scans concerning this be  sent to Dr. Juanda [name].  He already has a scheduled appointment to see Dr.  Juanda [name] back in follow-up.      [name] Reedy, PA-C  Electronically Signed      [name] Buckles. Bensimhon, MD  Electronically Signed   ML/MedQ  DD: 05/11/2007  DT: 05/11/2007  Job #: [id number]   cc:   [name], M.D.  [name], M.D.

## 2010-08-05 NOTE — Op Note (Signed)
[name], [name]                ACCOUNT NO.:  [id number]   MEDICAL RECORD NO.:  [id number]          PATIENT TYPE:  AMB   LOCATION:  [facility]                         FACILITY:  [facility]   PHYSICIAN:  Sigmund I. Patsi Sears, M.D.DATE OF BIRTH:  April 24, 1932   DATE OF PROCEDURE:  04/02/2008  DATE OF DISCHARGE:                               OPERATIVE REPORT   PREOPERATIVE DIAGNOSIS:  T1c adenocarcinoma prostate.   POSTOPERATIVE DIAGNOSIS:  T1c adenocarcinoma prostate.   OPERATION:  Cryotherapy of the prostate.   SURGEON:  S. Patsi Sears, M.D.   ANESTHESIA:  General LMA.   OPERATION:  After appropriate preanesthesia, the patient was brought to  the operating room and placed on the operating room in the dorsal supine  position where general LMA anesthesia was introduced.  He was then  replaced in the dorsal lithotomy position where the pubis was prepped  with Betadine solution and draped in the usual fashion.   REVIEW OF HISTORY:  Mr. [name] is a 77 year old male, with T1c  adenocarcinoma prostate, with a PSA of 7.54, and ultrasound showing a  47.56 mL gland.  Adenocarcinoma was found in all biopsies from the right  base, Gleason 7 and Gleason 3 + 3 (6) adenocarcinoma and 6/6 biopsies on  the right (70% of all biopsies).  He also had Gleason 3 + 3 (6).  He had  no adenocarcinoma in the left apex biopsies. The patient has been  counseled with regard to all prostate cancer therapies, and has selected  cryotherapy as it was the least invasive monotherapy which would give  him a successful resolution of his disease.  The patient has been  counseled with regard to erectile dysfunction.  His chronic atrial  fibrillation, and Coumadin is being treated by Dr. Jeannetta Nap.   PROCEDURE:  With the patient in the dorsal lithotomy position, the  perineum was prepped with non-iodine solution, (patient allergic to  iodine), and draped in the usual fashion; 3-0 and 4-0 nylon suture was  used to suture the  scrotum to the abdominal wall, and a towel drape was  clipped to the perineum and abdomen, in order to keep the scrotum  elevated.  Cystoscopy revealed normal-appearing urethra, and bilobar BPH  only.  No median lobe was noted.  There was no trabeculation, no  cellular formation.  There was no bladder stone or tumor formation  noted.   A Foley catheter was placed, and the patient underwent prostate  ultrasonography.  Under real time ultrasound guidance, 5 rows of needles  were then placed strategically in the prostate gland, and each of these  rows of needles was connected to the cryotherapy machine.  The patient  then underwent 2 separate freeze/cycles of cryotherapy, with excellent  coverage of the entire prostate and seminal vesicles.  Two separate  needles were connected specifically to evaluate the sphincter  temperature, and Denonvilliers fascia temperature, and these were  monitored throughout the case to be sure that these areas did not get  too cold.  Note that the extreme temperature for the external sphincter  was 13 degrees centigrade, and for the  Denonvilliers fascia it was 3  degrees centigrade at its coldest.  The patient tolerated the procedure  well.  He underwent 2 freeze thaw cycles, and after the second thaw, the  previously placed urethral warming device was left in place for 20  minutes.  It is noted that the patient had the urethral warming device  placed after cystoscopy was accomplished to make sure that none of the  needles traversed  the urethra or prostate.  Foley catheter was then placed after the  urethral warming device had been in place for 20 minutes at the end of  procedure, and the patient was allowed to go to the recovery room with  15 mg of Toradol.  He had B & O suppository at beginning of the  procedure.      Sigmund I. Patsi Sears, M.D.  Electronically Signed     SIT/MEDQ  D:  04/02/2008  T:  04/02/2008  Job:  [id number]   cc:   Windle Guard, M.D.  Fax: [phone]

## 2010-08-05 NOTE — Assessment & Plan Note (Signed)
[facility]                            CARDIOLOGY OFFICE NOTE   NAME:[name]                       MRN:          [id number]  DATE:03/01/2008                            DOB:          Jan 16, 1933    PRIMARY CARE PHYSICIAN:  Windle Guard, MD.   REFERRING PHYSICIAN:  Windle Guard, MD and Sigmund I. Patsi Sears, MD   REASON FOR REFERRALS:  Preop evaluation prior to urologic surgery.   CLINICAL HISTORY:  [name] is a 77 year old and came in today for  preop evaluation prior to urologic surgery.   He has a history of hypertension and hyperlipidemia.  He also has a  history of a tachycardia.  We had not been able to determine any  definite etiology to his tachycardia.  He had an echocardiogram  performed in 2006, which showed normal LV function.   He also has a Paragon II pacemaker which was put in for AV block in  1991.   He has a prostate cancer documented by biopsy and he has had bone scans  and is not thought to have any squared.  Dr. Patsi Sears is recommended  cryotherapy and this is scheduled in the near future.   His past medical history is significant for previous pulmonary embolism  for which he takes Coumadin.  He also has hypertension and COPD and he  has a reflux.  He also had some asthma shot secondary to reflux.   CURRENT MEDICATIONS:  1. Aspirin 81 mg daily.  2. Micardis 80 mg 1/2 tablet daily.  3. Coumadin.  4. Lovastatin 40 mg daily.  5. Metoprolol 50 mg daily.  6. Os-Cal.   SOCIAL HISTORY:  He is retired.  He quit smoking in 2000.   Family history is negative for respiratory diseases.   Review of systems is positive for shortness of breath with exertion.   On examination, the blood pressure is 123/86 and the pulse 91 and  regular.  There was no venous tension.  The carotid pulses were full  without bruits.  The chest had somewhat decreased breath sounds, but no  wheezes.  Cardiac rhythm was regular.  I could hear  no murmurs or  gallops.  The abdomen was soft with normal bowel sounds.  There was no  hepatosplenomegaly.  Peripheral pulses were full with no peripheral  edema.  Musculoskeletal system showed deformities.  Skin was warm and  dry.  Neurologic examination showed no focal neurological signs.   An electrocardiogram showed sinus rhythm and was normal.  He has the  fast heart rate.  His thresholds were not checked.  He is programmed to  DDD and is not pacer dependent.  The estimated pacer longevity is 6  months.   IMPRESSION:  1. Preoperative evaluation prior to the urologic surgery.  2. Hypertension.  3. Hyperlipidemia.  4. History of nonobstructive coronary artery disease.  5. History of sinus tachycardia.  6. Status post programmed to DDD pacemaker implantation in 1991 for      atrioventricular block, not pacer dependent.  7. Chronic obstructive pulmonary disease.  8. Gastroesophageal reflux  disease.   RECOMMENDATIONS:  [name] has significant risk factors with age,  hypertension, hyperlipidemia, and previously known nonobstructive  coronary artery disease.  I think he should be evaluated with Myoview  scan prior to his surgery.  We scheduled for him to have an adenosine  stress test with Myoview scan.  Last one was in 2004.  This is negative  and I think he is okay for surgery.  He will be able to stop his  Coumadin 5 days before surgery and resume it as soon as possible  afterwards.  I would encourage that continuation of his beta-blocker  through surgery and aspirin through surgery if possible.  We will see  him back in followup here in 6 months and will be available if he has  any problems related to surgery.     [name], MD, [facility]  Electronically Signed    BRB/MedQ  DD: 03/01/2008  DT: 03/02/2008  Job #: [id number]

## 2010-08-08 NOTE — Discharge Summary (Signed)
[place]  Patient:    [name], [name]                       MRN: [id number] Adm. Date:  [id number] Disc. Date: 10/28/99 Attending:  Pricilla Riffle Dictator:   Tereso Newcomer, P.A. CC:         Hadassah Pais. Jeannetta Nap, M.D.                  Referring Physician Discharge [place]  DATE OF BIRTH:  04/30/1932.  PROCEDURE PERFORMED THIS ADMISSION:  Coronary angiography on October 28, 1999 with the following results:  Normal left main.  LAD with proximal long stenosis of 25%, focal midstenosis of 40% after first diagonal.  Mid long stenosis, 25%, circumflex with luminal irregularities.  RCA dominant.  PDA with luminal irregularities.  Left ventriculogram revealing EF of 65% and normal.  REASON FOR ADMISSION:  Chest pain.  DISCHARGE DIAGNOSES  1. Nonobstructive coronary artery disease.  2. Normal left ventricular function.  3. History of permanent pacemaker placement in 1991 secondary to complete     heart block (Paragon II DDD pacemaker).  4. Persantine Cardiolite, September 05, 1996, negative for ischemia, ejection     fraction 59%.  5. Hypertension.  6. Mitral valve prolapse.  7. History of nephrolithiasis.  8. Status post appendectomy.  9. History of emphysema? 10. Status post polypectomy. 11. Hypokalemia, corrected prior to discharge.  ADMISSION HISTORY:  This 77 year old male with the above-noted history, presented to the ER on the evening of admission for evaluation of chest pain. He had been doing well until the last several months.  He began to feel tired and had fleeting chest pain.  The patient reported a recent abnormal cholesterol profile.  On the day prior to admission, he was driving down the road in his truck when he began to have a dull ache across his chest that felt like a "band."  This radiated to his neck and arms.  He had associated shortness of breath, diaphoresis and plus/minus nausea.  The patient did not take any nitroglycerin.   His pain waxed and waned.  The pain resolved until the night prior to admission, then the patient noted pain that recurred around noon on the date of admission.  It was a similar type of pain.  This prompted him to come to the emergency room.  He received nitroglycerin with relief. Upon initial evaluation, he was still complaining of some neck and jaw pain. His breathing was okay.  The emergency room staff placed him on IV heparin and nitroglycerin.  PAST MEDICAL HISTORY:  Significant for tobacco use; the patient discontinued tobacco a couple of years ago.  PHYSICAL EXAMINATION:  Initial physical exam revealed a pleasant male in no acute distress.  Blood pressure 115/68, pulse 83.  HEART:  Regular rate and rhythm.  Distant.  ABDOMEN:  Flat, soft and nontender.  LUNGS:  Decreased breath sounds with occasional wheezes.  EXTREMITIES:  Intact pulses and no edema.  LABORATORY AND X-RAY FINDINGS:  Chest x-ray:  No acute disease.  EKG:  Normal sinus rhythm, first-degree A-V block, rate 98.  No ischemic changes.  Initial labs:  BUN 14, creatinine 1.2, potassium 3.4, hemoglobin 15, hematocrit 43, glucose 118.  Repeat potassium on comprehensive metabolic panel was 3.2.  HOSPITAL COURSE:  Patient was admitted for chest pain and placed on rule out protocol for myocardial infarction.  The patient ruled out for MI  by enzymes with the following results:  Total CK #1 67, #2 52, #3 50.  CK-MB #1 1.5, #2 0.9, #3 1.1.  Troponin I less than 0.03 x 3.  Patient also had a lipid profile checked; this revealed a total cholesterol of 182, triglycerides 187, HDL 42, LDL 103.  The patient was continued on nitroglycerin and heparin. Beta blocker was added and aspirin was also continued.  The patients HCTZ was discontinued.  His potassium was replaced.  Followup potassium was 4.0 on October 27, 1999.  Patient remained stable throughout the weekend without complaints.  On the morning of October 27, 1999, the  patient noted that he was still having some sharp chest pain that would last only a few seconds but no shortness of breath or nausea.  The patient was originally scheduled for a cardiac catheterization on October 27, 1999 but this had to be rescheduled due to an emergency case; therefore, the patient went for the procedure on October 28, 1999.  The results are noted above.  He had no immediate complications.  On the afternoon of October 28, 1999, after his bedrest was up, he was found to be in stable condition.  His groin was stable without hematoma or bruits.  It was decided, given the results from his catheterization, that no further cardiac workup would be needed.  Given his hypokalemia, his hydrochlorothiazide would be discontinued and he would be continued on Altace for blood pressure control.  Beta blocker was discontinued prior to discharge. The patients Prevacid was continued and he was continued on full-strength aspirin.  Other labs prior to discharge:  WBC 7300, hemoglobin 14.5, hematocrit 40.2, platelet count 300,000.  Total protein 6.8, albumin 3.7, AST 25, ALT 19, alkaline phosphatase 85, total bilirubin 1.0.  INR 1.0.  DISCHARGE MEDICATIONS  1. Altace 2.5 mg p.o. q.d.  2. Coated aspirin 325 mg p.o. q.d.  3. Prevacid 30 mg p.o. q.d.  4. Nitroglycerin 0.4 mg sublingual p.r.n. chest pain.  5. Multivitamin q.d.  ACTIVITY:  No driving, heavy lifting, exertion or sex for three days.  DIET:  Low-fat, low-cholesterol, low-sodium diet.  WOUND CARE:  The patient is to watch his groin for any increased swelling, bleeding or bruising; call office with concerns.  SPECIAL INSTRUCTIONS:  As noted above, the patients HCTZ has been stopped. He has been advised that he can pick up samples of his medications at our office in [place].  FOLLOWUP:  Followup is with Dr. Hadassah Pais. [name] in 7 to 10 days for blood pressure check and basic metabolic profile; the patient should call for  an appointment.  Follow up with the physician assistant for Dr. Everardo Beals. Juanda Chance  on Friday, November 14, 1999, at 11 a.m. for post-catheterization check. DD:  10/28/99 TD:  10/28/99 Job: [id number]

## 2010-08-08 NOTE — Assessment & Plan Note (Signed)
[facility]                            CARDIOLOGY OFFICE NOTE   NAME:[name], CECILIO [name]                       MRN:          [id number]  DATE:07/20/2006                            DOB:          [birth year]    PRIMARY CARE PHYSICIAN:  Windle Guard, M.D.   CLINICAL HISTORY:  Mr. [name] is 77 year old and had a Paragon II  pacemaker implanted in 1991 for AV block.  He has done quite well from  the standpoint of his heart and pacemaker since that time.  He has had  no recent chest pain, shortness of breath or palpitations.   PAST MEDICAL HISTORY:  Is significant for history of pulmonary embolism,  for which he takes Coumadin.  He also has hypertension.  He also has had  dyspnea and has been evaluated by Dr. Sherene Sires, who feels he has reflux and  asthma related to this and is on Prilosec for this.  He also has COPD by  CT scan.   CURRENT MEDICATIONS:  Include aspirin, Micardis, Coumadin, lovastatin  and Prilosec.   EXAMINATION:  Today his blood pressure is 162/92 and the pulse 74 and  regular.  There was no venous distension.  The carotid pulses were full without  bruits.  The chest was clear without rales or rhonchi.  The cardiac rhythm was regular.  No murmurs or gallops.  ABDOMEN:  Was soft with normal bowel sounds.  Peripheral pulses were full with no peripheral edema.   We interrogated his pacemaker today and his threshold was 3.5 unipolar,  4.5 bipolar and we left him at 4.5 in unipolar mode on the ventricular  lead.  His atrial lead threshold was 1.5.  He is not pacer dependent.  He is overriding his pacer most of the time and it is programmed with a  long AV delay and a low base rate.   IMPRESSION:  1. Status post Paragon II pacemaker implantation 1991 for      atrioventricular block, now stable with moderate high threshold in      the ventricular lead.  2. Non-obstructive coronary disease.  3. History of pulmonary embolism.  4. Hypertension.  5. Shortness of breath related to chronic obstructive pulmonary      disease and possibly esophageal reflux.   DISPOSITION:  We will plan to continue the same medications.  I will  plan to follow him on telephone checks.  His estimated longevity is less  than a year, but he is pacing very little and I suspect he will get  longer that.  I will plan to see him back in followup in a year.     [name], MD, [facility]  Electronically Signed    BRB/MedQ  DD: 07/20/2006  DT: 07/20/2006  Job #: [id number]

## 2010-08-08 NOTE — Discharge Summary (Signed)
[name], [name]                ACCOUNT NO.:  [id number]   MEDICAL RECORD NO.:  [id number]          PATIENT TYPE:  INP   LOCATION:  2006                         FACILITY:  [facility]   PHYSICIAN:  Duncan Dull, M.D.     DATE OF BIRTH:  [birth year]   DATE OF ADMISSION:  06/01/2004  DATE OF DISCHARGE:  06/03/2004                                 DISCHARGE SUMMARY   DISCHARGE DIAGNOSES:  1.  Atypical chest pain of noncardiac origin.  2.  Elevated liver function tests.  3.  Coronary artery disease.  4.  Hypertension.  5.  History of pulmonary emboli on Coumadin therapy.  6.  Status post pacemaker.  7.  Dyslipidemia.   DISCHARGE MEDICATIONS:  1.  Coumadin 5 mg from Tuesday to Sunday and 2.5 mg on Monday.  2.  Micardis 80 mg half of a tablet a day.  3.  Prilosec 20 mg p.o. daily.  4.  Aspirin 81 mg one tablet p.o. daily.   Do no take Crestor.   DISPOSITION:  The patient is to go home with followup at [facility]  on June 10, 2004, at 10:15 a.m. when CMET is going to be drawn to check  liver functions and also we suggest to recheck the pacemaker as in this  hospitalization sometimes during telemetry the pacemaker showed failure to  capture.   CONSULTS:  None.   PROCEDURES:  1.  On June 01, 2004, the patient had a CT angiography of the chest.  2.  On June 02, 2004, a 2-D echocardiogram.  3.  On June 02, 2004, abdominal ultrasound.  4.  On June 03, 2004, a HIDA.   HISTORY OF PRESENT ILLNESS:  This 77 year old white male with known coronary  artery disease was in his usual state of health.  He was playing at church  today when he had sudden onset of subscapular and intrascapular pain which  radiated to the left chest, left jaw and left abdomen.  The pain was 8/10  and squeezing in nature.  He denies shortness of breath, but he had some  dizziness and nausea, but no emesis.  He also had some diaphoresis.  The  patient has had similar pain twice before with kidney stones.   He denied  exertional symptoms, no cough and no wheezing.  The patient stated that the  pain lasted for about two to two-and-a-half hours.  He went home.  He took  some Vicodin that relieved the pain partially.  Because of the persistence  of the pain, he went to the emergency room.  When he arrived at the  emergency room, the pain was already gone.   PHYSICAL EXAMINATION:  He was in no acute distress and with no pain present  when we saw the patient at the emergency room.  The pulse was 67, blood  pressure 122/67, temperature 97.8 degrees, respiratory rate 18 and oxygen  saturation 95% on room air.  The physical exam was absolutely benign.   LABORATORIES ON ADMISSION:  INR 2.8, PT 23.6.  Sodium 141, potassium 3.8,  chloride 106, bicarbonate 27,  BUN 15, creatinine 1.1, glucose 105.  Cardiac  enzymes point of marker were within normal limits.  White blood cell count  7.2, hemoglobin 14.2, hematocrit 40, MCV 86.8, platelets 255.   HOSPITAL COURSE:  #1 - ATYPICAL CHEST PAIN:  Due to the nature of the pain  interscapular that radiated downwards, the patient had an angio CT of the  chest to rule out PE and to rule out aortic dissection.  The angio CT was  done at the moment of admission and the impression was:  1.  No evidence of  acute pulmonary embolism or other active disease.  2.  Bullous emphysema and  mild cardiomegaly, without change.  During this hospitalization, the patient  never presented pain again.Marland Kitchen  He was put on telemetry, which showed no  arrhythmia and no EKG changes.  Cardiac enzymes were cycled and all of them  were within normal limits.  So all of the causes of chest pain were ruled  out.  Pneumonia was ruled out.  Pneumothorax was ruled out.  Esophageal  rupture was ruled out.  PE, MI and aortic dissection were ruled out.  Because we do not have a good explanation for this pain and because liver  enzymes came back elevated, an ultrasound of the abdomen was done and the   impression was:  1.  Multiple gallstones noted without acute findings  related to gallbladder.  2.  Bilateral renal cysts noted with a left renal  stone.  3.  The abdominal aorta was incompletely visualized due to overlying  gas.  However, the maximum diameter was 1.8.  Also, a urinalysis was within  normal limits.  Due to the fact of elevated liver function tests, a HIDA  scan was done and the impression was:  1.  Patent common bile duct and  cystic duct.  2. Filling defect within the gallbladder consistent with the  gallstone identified on the ultrasound yesterday.  It was thought that the  pain that Mr. [name] presented with could have been muscular in origin or  less probably, biliary colic that never presented again.  He was advised to  follow with his primary physician and cardiologist.  If he presents again  with this type of pain, he may need to have surgery evaluation to decide if  he wants to have a cholecystectomy.   #2 - ELEVATED LIVER FUNCTION TESTS:  On admission, liver function tests were  elevated, showing alkaline phosphatase 126, AST 306, ALT 193 and bilirubin  3.0.  It was thought that this is due to the treatment with Crestor that was  started in January of 2006.  For that reason, antistatins were held during  this hospitalization.  This is something that needs to be followed up as an  outpatient and in the future decide what kind of medication for his  dyslipidemia is going to be started.   #3 - CORONARY ARTERY DISEASE:  He is known to have nonobstructive coronary  artery disease, according to cardiac catheterization in August of 2001.  The  patient presented with pain and cardiac enzymes were within normal limits.   #4 - HYPERTENSION:  The patient was continued on his normal regimen and  blood pressure was within normal limits during this hospitalization.  #5 - HISTORY OF PULMONARY EMBOLI ON COUMADIN THERAPY:  As already stated,  angio CT of the thorax ruled out PE  and he was continued on his regular dose  of Coumadin.   #6 - STATUS  POST PACEMAKER:  The patient has a history of pacemaker since  1991.  During this hospitalization, he was in regular rhythm.  However, on  reviewing the strips, the pacemaker showed that sometimes it does not  capture.  This needs to be followed up by his cardiologist at the outpatient  clinic.   #7 - DYSLIPIDEMIA:  As already stated, the patient's normal medication was  held due to the suspicion of elevated liver enzymes secondary to Crestor.  We did a lipid profile that showed total cholesterol 196, triglycerides 144,  HDL 38 and LDL 129.   LABORATORIES AT DISCHARGE:  White blood cell count 6.6, hemoglobin 14.6,  hematocrit 41.0, MCV 87.3, platelets 275, ANC 3.5.  Sodium 139, potassium  3.8, chloride 107, CO2 26, glucose 98, BUN 9, creatinine 0.8, total  bilirubin 1.2, alkaline phosphatase 178, AST 182, ALT 235, total protein  6.2, albumin 3.2, calcium 10.1.      YC/MEDQ  D:  06/04/2004  T:  06/04/2004  Job:  [id number]   cc:   Charlies Constable, M.D. [facility]   Dr. Jeannetta Nap

## 2010-08-08 NOTE — Op Note (Signed)
NAMEGUISEPPE, FLANAGAN                ACCOUNT NO.:  [id number]   MEDICAL RECORD NO.:  [id number]          PATIENT TYPE:  EMS   LOCATION:  MINO                         FACILITY:  [facility]   PHYSICIAN:  Sharlet Salina T. Hoxworth, M.D.DATE OF BIRTH:  04/22/1932   DATE OF PROCEDURE:  12/22/2004  DATE OF DISCHARGE:  12/17/2004                                 OPERATIVE REPORT   PREOPERATIVE DIAGNOSIS:  Cholelithiasis and cholecystitis.   POSTOPERATIVE DIAGNOSIS:  Same plus choledocholithiasis.   SURGICAL PROCEDURE:  Laparoscopic cholecystectomy with intraoperative  cholangiogram.   SURGEON:  Sharlet Salina T. Hoxworth, M.D.   ANESTHESIA:  General.   BRIEF HISTORY:  Mr. Redner is a 77 year old male who since March of this year  has had episodic upper abdominal pain radiating into his chest. He has had  transient elevated LFTs noted. He has had a workup including an abdominal CT  scan in March showing nonobstructing renal stones. A hepatobiliary scan  June 03, 2004 showed filling defects in the gallbladder consistent with  gallstones but normal emptying into the duodenum. Ultrasound has revealed  multiple gallstones. He has had a bilirubin elevated as high as 3 that  returned to normal. With this constellation of findings he is felt to have  symptomatic gallbladder disease and I have recommended proceeding with  laparoscopic cholecystectomy with cholangiogram. The nature of the  procedure, indications, risks of bleeding, infection, bile leak,  bile duct  injury been discussed and understood.  He is now brought to the operating  room for this procedure.   DESCRIPTION OF PROCEDURE:  The patient was brought to the operating room and  placed in supine position on the operating table.  General endotracheal  anesthesia was induced. The abdomen was widely sterilely prepped and draped.  PAS were in place. He received preoperative IV antibiotics and 5000 units of  heparin subcutaneously. Local anesthesia was  used infiltrate the trocar  sites prior to the incisions. 1 cm incision was made at the umbilicus.  Dissection carried down the midline fascia which was sharply incised for 1  cm and the peritoneum entered under direct vision. Through a mattress suture  of 0 Vicryl, the Hasson trocar was placed and pneumoperitoneum established.  Under direct vision, a 10 mm trocar was placed in the subxiphoid area and  two 5 mm trocars along the right subcostal margin. The gallbladder was  visualized and was chronically thickened and inflamed. The fundus was  grasped and elevated up over the liver and the infundibulum retracted  inferolaterally. There were omental adhesions to the fundus and body of the  gallbladder that were taken down with careful cautery dissection. The  infundibulum was retracted inferolaterally. Fibrofatty tissue was stripped  down off the neck of the gallbladder. There was a fair amount of chronic  inflammation around Calot's triangle and the cystic duct. Dissection was  somewhat tedious but I was able to thoroughly dissect the distal  gallbladder, identify the cystic duct. Calot's triangle was thoroughly  dissected and a nice window created and the distal gallbladder dissected 360  degrees.  When the anatomy appeared clear,  the cystic duct which did appear  fairly large, was clipped at the gallbladder junction.  It was then opened  and operative cholangiogram obtained. This showed the catheter to be in the  cystic duct and there was good filling of a normal-sized common bile duct  and intrahepatic ducts with free flow into the duodenum but there were at  least three round filling defects in the distal common bile duct consistent  with distal common bile duct stones. Due to the poor condition of the cystic  duct, I felt that common duct exploration via the cystic duct would be  difficult and hazardous and I elected to leave the stones in place for  extraction with ERCP. The cystic  duct was further divided and then as it was  somewhat large and friable, it was secured with an Endoloop. Following this,  the gallbladder was dissected free from its bed using hook cautery, placed  in an EndoCatch bag and removed through the umbilicus.  The right upper  quadrant thoroughly irrigated and the hemostasis assured. A closed suction  drain was left in the gallbladder fossa and down in Morrison's pouch and  brought out through one of the lateral trocar sites. Trocars removed and all  CO2 evacuated. The mattress suture was secured to the umbilicus. Skin  incisions were closed with interrupted subcuticular 4-0 Monocryl and Steri-  Strips.  Sponge, needle and instrument counts were correct. Dry sterile  dressings were applied. The patient taken to recovery in good condition.      Lorne Skeens. Hoxworth, M.D.  Electronically Signed     BTH/MEDQ  D:  12/22/2004  T:  12/22/2004  Job:  [id number]

## 2010-08-08 NOTE — Assessment & Plan Note (Signed)
[place] [facility]                               PULMONARY OFFICE NOTE   NAME:[name], [name]                       MRN:          [id number]  DATE:01/26/2006                            DOB:          08-Oct-1932    PULMONARY NEW PATIENT EVALUATION/SELF REFERRAL:   CHIEF COMPLAINT:  Dyspnea.   HISTORY:  A 77 year old white male seen here previously in 2000 and again in  May 2004 for evaluation of atypical dyspnea that was felt to be due to  pseudo wheeze from reflux and/or ACE-inhibitors.  He has not returned since  then because he was doing fine until about 4 weeks ago when he developed  increasing symptoms of dyspnea, to the point where he can barely make it to  the mail box and back to the house.  He says there is no difference in his  good and bad days, has no associated fevers, chills, sweats.  Has noticed  increasing hoarseness and dry cough associated with the dyspnea but no  nocturnal exacerbations or sleep disruption.  He denies any pleuritic or  exertional chest pain, orthopnea, PND or leg swelling.   PAST MEDICAL HISTORY:  Significant for hypertension, previously on ACE  inhibitors but no longer taking them.  He also has a history of  hyperlipidemia.  He is status post lung surgery in 1955 and gallbladder  surgery in 2005.  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  He has a  permanent pacemaker in place.  His last echocardiogram dated March 2006  indicating an ejection fraction 55% to 60% with a moderate LVH but normal  left atrial size.   ALLERGIES:  PENICILLIN causes a rash.   MEDICATIONS:  1. Ecotrin.  2. Micardis.  3. Over the counter Prilosec.  4. Coumadin.  5. Lovastatin.   SOCIAL HISTORY:  He quit smoking 2000.  He is retired.  He denies any  unusual travel, pet, or hobby exposure.   FAMILY HISTORY:  It is taken in detail on the worksheet and is negative for  respiratory diseases, significant for the absence of atopy or  asthma.   REVIEW OF SYSTEMS:  Taken in detail on the worksheet and significant for  problems as outlined above.  He does continue to have overt heartburn  symptoms despite taking Prilosec.   PHYSICAL EXAMINATION:  This is a pleasant ambulatory white male with classic  voice fatigue.  He is afebrile with normal vital signs.  HEENT:  Unremarkable.  Pharynx clear.  LUNGS:  Reveal classic pseudo-wheeze with adequate air movement otherwise.  HEART:  Regular rate and rhythm without murmur, gallop or rub present.  ABDOMEN:  Soft, benign with no palpable organomegaly or mass.  EXTREMITIES:  Warm without calf tenderness, cyanosis, clubbing or edema.   IMPRESSION:  Is a classic upper airway dysfunction/vocal cord dysfunction  masquerading his asthma in this patient with previous documented  gastroesophageal reflux disease with stricture in 1991 and not maintaining  optimal control of acid on over-the-counter Prilosec with both breakthrough  heartburn and also hoarseness with dyspnea with exertion.  Recommendation  therefore is to switch to Prevacid 30 mg taken before the first and last  meal for the next 4 weeks and then return here for a set of pulmonary  function tests.  Certainly if he worsens on the Prevacid I will need to see  him back sooner, because the diagnosis would be in doubt.  Since he has been  a smoker, I recommended a follow-up PFTs and chest x-ray when he returns and  reviewed with him also dietary measures in detail in the form of a  gastroesophageal reflux disease diet.    ______________________________  Charlaine Dalton. Sherene Sires, MD, [facility]    MBW/MedQ  DD: 01/27/2006  DT: 01/27/2006  Job #: [id number]   cc:   Windle Guard, M.D.

## 2010-08-08 NOTE — H&P (Signed)
[name], [name] NO.:  [id number]   MEDICAL RECORD NO.:  [id number]          PATIENT TYPE:  EMS   LOCATION:  MAJO                         FACILITY:  [facility]   PHYSICIAN:  [place] Bing, M.D. LHCDATE OF BIRTH:  [birth year]   DATE OF ADMISSION:  05/04/2005  DATE OF DISCHARGE:                                HISTORY & PHYSICAL   REFERRING:  Dr. Lynelle Doctor   PRIMARY CARE PHYSICIAN:  Dr. Ricka Burdock   PRIMARY CARDIOLOGIST:  Dr. Juanda Chance   HISTORY OF PRESENT ILLNESS:  77 year old gentleman with non-obstructive  coronary disease with last assessed in 2001.  Now presents with bilateral  shoulder aching and some loss of control of hypertension.  [name]'  cardiac history dates to approximately seven years ago when he began to  experience episodes of chest discomfort.  A number of these were attributed  to nephrolithiasis.  Cardiac catheterization performed in August 2001  revealed a 40% mid LAD lesion, luminal irregularities in the circumflex, and  a dominant right coronary also with luminal irregularities.  Left  ventricular systolic function was normal.   [name] did well for a number of years, but returned in 2006, again with  chest discomfort.  Evaluation at that time revealed cholelithiasis.  He  ultimately underwent cholecystectomy.  He continued to do well until the  past few days when he has had episodic aching discomfort on both sides of  his neck also extending to the shoulders and radiating down the left arm at  times.  This is mild to moderate in intensity.  He has had some dyspnea as  well, but not clearly directly associated with this discomfort.  Episodes  typically last about 10 minutes and resolve spontaneously.  There is no  apparent relationship to movement or exertion.   [name] has hyperlipidemia that has been treated with Statin medications.  He was a cigarette smoker in the past, but stopped four years ago.  There is  no history of  diabetes.   Cardiac history is also notable for third-degree AV block for which a dual  chamber pacemaker was placed in 1991.  A stress nuclear study was most  recently negative in September of 2005.  There is a remote history of mitral  valve prolapse.   Past medical history is otherwise notable for a pulmonary embolism for which  he has continued to be anticoagulated with warfarin.  He has had a remote  appendectomy.  He has a component of COPD.   CURRENT MEDICATIONS:  1.  Aspirin 81 mg daily.  2.  Micardis 40 mg daily.  3.  Prilosec 40 mg daily.  4.  Warfarin.  5.  Crestor.  6.  Metoprolol.  7.  Nitrofurantoin.  8.  Phenergan.  9.  Vicodin.   SOCIAL HISTORY:  Married and lives in [place] with his wife.  No  excessive use of alcohol.   FAMILY HISTORY:  No prominent coronary disease.   REVIEW OF SYSTEMS:  Relatively sedentary lifestyle.  Otherwise unremarkable.   PHYSICAL EXAMINATION:  GENERAL:  Pleasant, trim gentleman in no  acute  distress.  VITAL SIGNS:  Blood pressure is 140/90, heart rate 75 and regular,  respirations 20, temperature 98.2.  HEENT:  Anicteric sclerae; EOMs full; normal oral mucosa.  SKIN:  Multiple skin tags over the back.  NECK:  No jugular venous distension; normal carotid upstrokes without  bruits.  ENDOCRINE:  No thyromegaly.  LUNGS:  Minimal expiratory rhonchi.  CARDIAC:  Normal first and second heart sounds; prominent fourth heart  sounds; normal PMI.  ABDOMEN:  Soft and nontender; no organomegaly.  EXTREMITIES:  No edema.   IMPRESSION:  [name] presents with somewhat vague and atypical symptoms.  With known, albeit non-obstructive, coronary disease, observation is  advisable to rule out myocardial infarction.  If cardiac markers and EKGs  remain negative we will arrange an outpatient stress Myoview study.  Lipid  control will be assessed.  He is low risk for an acute coronary syndrome and  thus will simply be continued on his current  excellent medical regime.      Carmen Bing, M.D. [facility]  Electronically Signed     RR/MEDQ  D:  05/05/2005  T:  05/05/2005  Job:  [id number]

## 2010-08-08 NOTE — Cardiovascular Report (Signed)
[place]. [facility]  Patient:    [name], [name]                       MRN: [id number] Proc. Date: 10/28/99 Adm. Date:  [id number] Attending:  Pricilla Riffle CC:         Buren Kos, M.D.             [name], M.D. [facility]                        Cardiac Catheterization  DATE OF BIRTH:  May 30, 1932.  PRIMARY CARE PHYSICIAN:  Hadassah Pais. Jeannetta Nap, M.D.  PROCEDURE:  Left heart catheterization, coronary arteriography.  INDICATION:  Evaluate patient with chest pain and previous nonobstructive coronary disease on catheterization.  PROCEDURE NOTE:  Left heart catheterization was performed via the right femoral artery.  The artery was cannulated using an anterior wall puncture.  A #6 French arterial sheath was inserted via the modified Seldinger technique. Preformed Judkins and a pigtail catheter were utilized.  A Perclose was utilized to achieve vascular hemostasis.  However, this failed to achieve adequate hemostasis with slight oozing at the end of the procedure.  Pressure was held for 10 minutes with good hemostasis and no apparent groin complications.  The patient tolerated the procedure well and left the lab in stable condition.  RESULTS:  HEMODYNAMICS:  LV 144/18, AO 144/72.  CORONARIES:  Left main was normal.  The LAD had a proximal long 25% stenosis followed by focal mid-40% lesion after first diagonal, and mid-long 25% stenosis.  It was a somewhat narrow-caliber vessel.  The circumflex was small with luminal irregularities.  The right coronary artery was a very large, dominant vessel.  There were luminal irregularities in the proximal PDA.  LEFT VENTRICULOGRAM:  The left ventriculogram was obtained in the RAO projection.  The EF was 65% with normal wall motion.  CONCLUSION:  Nonobstructive coronary artery disease.  PLAN:  No further cardiovascular workup is planned.  The patient will be started on Prilosec for possible GI etiology to  his symptoms.  He will also be switched from hydrochlorothiazide to an ACE inhibitor.  This is done because of his hypokalemia at presentation.  He has persistent hypertension.  Also, this is done for the benefits as described in the HOPE trial.  We will contact Dr. Jeannetta Nap to discuss this with him.  We will arrange for the patient to have follow-up for basic metabolic profile and blood pressure check with Dr. Jeannetta Nap.  He will follow in our office in approximately two weeks for a groin check.  He will also continue to follow up as scheduled with Dr. Juanda Chance for pacemaker evaluations. DD:  10/28/99 TD:  10/28/99 Job: [id number]

## 2010-08-08 NOTE — Discharge Summary (Signed)
NAMEMARIN, [name]                ACCOUNT NO.:  [id number]   MEDICAL RECORD NO.:  [id number]          PATIENT TYPE:  OBV   LOCATION:  2015                         FACILITY:  [facility]   PHYSICIAN:  Charlies Constable, M.D. [facility] DATE OF BIRTH:  04-Oct-1932   DATE OF ADMISSION:  05/04/2005  DATE OF DISCHARGE:  05/05/2005                                 DISCHARGE SUMMARY   PRIMARY DIAGNOSES:  1.  Chest pain, cardiac enzymes negative for myocardial infarction and      patient scheduled for outpatient adenosine Myoview.  2.  Status post laparoscopic cholecystectomy with intraoperative      cholangiogram October 2006.  3.  Status post cardiac catheterization in August 2001 with 25% and 40% left      anterior descending lesions, circumflex with luminal irregularities and      right coronary artery dominant with no disease, luminal irregularities      in the posterior descending artery.  4.  Status post adenosine Myoview in 2005 with no ischemia and an ejection      fraction of 75%.  5.  History of pulmonary embolus in October 2005 on Coumadin (chest CT scan      in March 2006 without pulmonary embolus).  6.  Occasional dizziness with negative orthostatic's.  7.  Hypertension, systolic blood pressure well controlled on current medical      therapy.  8.  History of complete heart block status post Paragon II pacemaker 1991.   PROCEDURE:  None.   HOSPITAL COURSE:  [name] is a 77 year old male with history of  nonobstructive coronary artery disease by catheterization in 2001.  He came  to the hospital for episodic neck and shoulder aching that radiated into  both arms that started two days ago.  He also had some intermittent dyspnea.  He was admitted overnight to rule out myocardial infarction. His cardiac  enzymes were negative for myocardial infarction and his symptoms resolved.  [name] also had not been on a Statin prior to admission and a lipid  profile was pending at the time of dictation.   His last lipid profile in  March of 2006 showed total cholesterol 196, triglycerides 144, HDL 38, LDL  129.  He is to resume Lovastatin 20 mg a day.   [name] was without symptoms or complaints on May 05, 2005 and is  considered stable for discharge with outpatient stress testing and follow up  arranged.   DISCHARGE INSTRUCTIONS:  His activity level is to be reduced until after the  stress test.  He is to stick to a low fat diet.   DISCHARGE MEDICATIONS:  1.  Aspirin 81 mg daily.  2.  Micardis 40 mg daily.  3.  Prilosec 40 mg daily.  4.  Coumadin 5 mg daily, (followed by Dr. Jeannetta Nap).  5.  Lovastatin 20 mg daily.      Theodore Demark, P.A. [facility]    ______________________________  Charlies Constable, M.D. [facility]    RB/MEDQ  D:  05/05/2005  T:  05/05/2005  Job:  [id number]   cc:   Windle Guard,  M.D.  Fax: [phone]

## 2010-08-08 NOTE — Discharge Summary (Signed)
[place]. [facility]  Patient:    [name], [name]                       MRN: [id number] Adm. Date:  [id number] Attending:  Pricilla Riffle Dictator:   Tereso Newcomer, P.A.                  Referring Physician Discharge [facility]  ADDENDUM TO REPORT [phone]  Please send a copy to Dr. Jeannetta Nap. DD:  10/28/99 TD:  10/28/99 Job: [id number]

## 2010-08-08 NOTE — Assessment & Plan Note (Signed)
[facility]                             PULMONARY OFFICE NOTE   NAME:[name], [name]                         MRN:          [id number]  DATE:03/03/2006                            DOB:          1932/04/05    This is a pulmonary extended summary followup office visit.   HISTORY:  This is a 77 year old white male with a history of choking and  dyspnea and inability to get from the mailbox and back to the house when  I saw him on January 27, 2006, convinced that he had vocal cord  dysfunction more so than COPD or asthma to explain his symptoms. I asked  him to take Prevacid in high doses which he could not tolerate due to  diarrhea, and he is back to taking Prilosec in low doses with only  minimal improvement. He tells me today that for the first time that  actually he is having paroxysms of dyspnea at rest. These do not occur  asleep and do not recur now reproducibly with exercise, a very marked  difference from his original history. However, each time he is short of  breath, he notices hoarseness and sometimes an extensive choking (he  points to his suprasternal notch as the area where he feels blocked).   Note, this patient does have history of severe GERD with stricture by  previous endoscopy in 1991 but underwent an upper endoscopy on December 23, 2004 at which time there was no evidence of a definite esophageal  abnormality documented by Dr. Christella Hartigan.   For full information on medications, please see patient sheet dated  today.   On physical exam, he is a hoarse ambulatory white male with classic  voice fatigue. The more he talks the more hoarse he becomes. He is  afebrile with stable vital signs.  HEENT:  Unremarkable. Oropharynx is clear. There is no evidence of  excessive post nasal drainage.  NECK:  Supple without cervical adenopathy, tenderness. Trachea is  midline. No thyromegaly.  LUNGS:  Fields are clear bilaterally to auscultation and  percussion  CARDIAC:  Regular rate and rhythm without murmur, gallop or rub.  ABDOMEN:  Soft, benign.  EXTREMITIES:  Warm without calf tenderness, clubbing, cyanosis, or  edema.   Spirometry was reviewed with the patient and indicates a FEV1 of over 2  liters with no improvement after bronchodilators. Diffusing capacity is  decreased at 60%. Chest x-ray not available.   IMPRESSION:  The only abnormality that is obvious by PFTs is the  inspiratory loop which is markedly abnormal but not suggestive of a  fixed obstruction. The patient is convinced that he is choking, which  probably is nothing more than either globus hystericus or poorly  controlled reflux. Since he did poorly with reflux treatment, however, I  am going to recommend initially having him seen by ENT to be sure that  the hoarseness, given the fact that he used to be a smoker, is not  related to neoplasm, and if he is cleared by ENT, the next step would  be to refer back to  his GI physician of record, Dr. Wendall Papa, for  other objects regarding treatment of GERD.   However, having documented that his PFTs are essentially normal on  expiration with a FEV1 of over 2 liters, there were would be no  explanation for why he has such variable dyspnea that occurs with  exertion associated with hoarseness and not present at night (distinctly  not history of an asthmatic).   Pulmonary followup can therefore be on a p.r.n. basis.     Charlaine Dalton. Sherene Sires, MD, [facility]  Electronically Signed    MBW/MedQ  DD: 03/03/2006  DT: 03/03/2006  Job #: [id number]

## 2010-08-08 NOTE — Op Note (Signed)
[name], [name]                ACCOUNT NO.:  [id number]   MEDICAL RECORD NO.:  [id number]          PATIENT TYPE:  AMB   LOCATION:  [facility]                         FACILITY:  [facility]   PHYSICIAN:  [place] Service, M.D.DATE OF BIRTH:  September 20, 1932   DATE OF PROCEDURE:  09/04/2005  DATE OF DISCHARGE:                                 OPERATIVE REPORT   PREOP DIAGNOSIS:  A 77 year old male hematuria on Coumadin, 14-mm left renal  stone.   POSTOP DIAGNOSIS:  A 77 year old male hematuria on Coumadin, 14-mm left  renal stone.   PROCEDURE:  Cystoscopy, retrograde, left double-J stent.   SURGEON:  [place] Service, M.D.   ASSISTANT:  None.   ANESTHESIA:  General.   SPECIMENS:  None.   ESTIMATED BLOOD LOSS:  Minimal.   COMPLICATIONS:  None obvious   DESCRIPTION OF PROCEDURE:  The patient was prepped and draped in the dorsal  lithotomy position after institution of an adequate level of general  anesthesia.  A well lubricated 21-French panendoscope was gently inserted at  the urethral meatus, normal urethra and sphincter, nonobstructive prostate.  Clear efflux right and left orifice.  The patient has been off of Coumadin  for 5 days.   Retrograde films were then obtained.  Cone-tip catheter inserted at the  right and left ureteral orifice; with gentle injection of 3-6 mL of contrast  the ureter, pelvis, and calyces were gently outlined without filling defect  or obstruction on the right.  On the left side there was a 14-mm filling  defect in the lower pole calix.  Once retrograde films had been obtained,  floppy tip guidewire was inserted at the left ureteral orifice.  It was  advanced to the upper  pole calyces.  A 6-French, 26-cm, double-J stent was selected, advanced over  the guidewire with excellent pigtail formation on guidewire removal.  Bladder was drained.  Cystoscope was removed.  The patient was given a B&O  suppository and returned to recovery in satisfactory  condition.           ______________________________  [place] Service, M.D.     RH/MEDQ  D:  09/04/2005  T:  09/04/2005  Job:  [id number]   cc:   Windle Guard, M.D.  Fax: [phone]   Charlies Constable, M.D. [facility]  [address]  [address]  [place]  [place] [address]

## 2010-08-26 ENCOUNTER — Telehealth: Payer: Self-pay | Admitting: Internal Medicine

## 2010-08-26 NOTE — Telephone Encounter (Signed)
Pam from dr Patsi Sears office [phone] ext [phone]  Fax # [phone] need sur clearance for lithotripsy on august 23rd. Pt needs to hold coumadin. Pam is faxing sur clearance form.

## 2010-08-26 NOTE — Telephone Encounter (Signed)
Left a message to call back.

## 2010-08-28 ENCOUNTER — Encounter: Payer: Medicare Other | Admitting: *Deleted

## 2010-08-28 NOTE — Telephone Encounter (Signed)
[name] has form to fill out and we will fax

## 2010-08-28 NOTE — Telephone Encounter (Signed)
Form faxed

## 2010-09-02 ENCOUNTER — Encounter: Payer: Self-pay | Admitting: *Deleted

## 2010-11-10 ENCOUNTER — Telehealth: Payer: Self-pay | Admitting: Internal Medicine

## 2010-11-10 NOTE — Telephone Encounter (Addendum)
Pt last transmission was done in March and he will be having surgery on Thursday 8/23 and need a transmission no older than 30 days. Informed Gunnar Fusi of [facility] calling and then called pt left message for him to send transmission asap. When this is done please fax to Short stay  [phone].

## 2010-11-10 NOTE — Telephone Encounter (Signed)
Per Pam, surgery reschedule to 9/17. Need to discuss pacer marker integrated, stopping coumadin. Surgical clearance.

## 2010-11-10 NOTE — Telephone Encounter (Signed)
Spoke with Pam and she will refax form and we will get it re faxed to them  Spoke with patient he will need to come in and have his device interrogated prior to the surgery

## 2010-11-11 ENCOUNTER — Encounter: Payer: Self-pay | Admitting: Internal Medicine

## 2010-11-11 ENCOUNTER — Ambulatory Visit (HOSPITAL_COMMUNITY): Payer: Medicare Other | Attending: Urology

## 2010-11-11 ENCOUNTER — Ambulatory Visit (INDEPENDENT_AMBULATORY_CARE_PROVIDER_SITE_OTHER): Payer: Medicare Other | Admitting: *Deleted

## 2010-11-11 DIAGNOSIS — I442 Atrioventricular block, complete: Secondary | ICD-10-CM

## 2010-11-11 DIAGNOSIS — Z950 Presence of cardiac pacemaker: Secondary | ICD-10-CM | POA: Insufficient documentation

## 2010-11-11 DIAGNOSIS — Z01818 Encounter for other preprocedural examination: Secondary | ICD-10-CM | POA: Insufficient documentation

## 2010-11-11 DIAGNOSIS — R55 Syncope and collapse: Secondary | ICD-10-CM

## 2010-11-11 NOTE — Telephone Encounter (Signed)
°  Images scanned per [facility]

## 2010-11-11 NOTE — Telephone Encounter (Signed)
°  Images Scanned per [facility]

## 2010-11-11 NOTE — Telephone Encounter (Signed)
Had patient send transmission through [employer]. Faxed results to short stay.

## 2010-11-14 ENCOUNTER — Telehealth: Payer: Self-pay | Admitting: Internal Medicine

## 2010-11-14 NOTE — Telephone Encounter (Signed)
All info was faxed over  Tried to call [name]  Lmom for her

## 2010-11-14 NOTE — Telephone Encounter (Signed)
[name] wants to talk to [facility] re pt.

## 2010-11-19 ENCOUNTER — Encounter: Payer: Self-pay | Admitting: Internal Medicine

## 2010-11-19 ENCOUNTER — Other Ambulatory Visit: Payer: Self-pay

## 2010-11-19 LAB — REMOTE PACEMAKER DEVICE
AL IMPEDENCE PM: 450 Ohm
ATRIAL PACING PM: 3.6
BAMS-0003: 65 {beats}/min
RV LEAD IMPEDENCE PM: 310 Ohm
VENTRICULAR PACING PM: 1

## 2010-11-25 ENCOUNTER — Encounter: Payer: Self-pay | Admitting: *Deleted

## 2010-12-01 ENCOUNTER — Ambulatory Visit (HOSPITAL_COMMUNITY)
Admission: RE | Admit: 2010-12-01 | Discharge: 2010-12-01 | Disposition: A | Discharge status: Home / Self Care | Payer: Medicare Other | Source: Ambulatory Visit | Attending: Urology | Admitting: Urology

## 2010-12-01 DIAGNOSIS — I10 Essential (primary) hypertension: Secondary | ICD-10-CM | POA: Insufficient documentation

## 2010-12-01 DIAGNOSIS — N200 Calculus of kidney: Secondary | ICD-10-CM | POA: Insufficient documentation

## 2010-12-01 DIAGNOSIS — Z95810 Presence of automatic (implantable) cardiac defibrillator: Secondary | ICD-10-CM | POA: Insufficient documentation

## 2010-12-01 LAB — PROTIME-INR: INR: 0.99 (ref 0.00–1.49)

## 2010-12-01 NOTE — Progress Notes (Signed)
Pacer was checked remotely.

## 2010-12-10 ENCOUNTER — Encounter: Payer: Self-pay | Admitting: Gastroenterology

## 2011-01-05 ENCOUNTER — Ambulatory Visit (INDEPENDENT_AMBULATORY_CARE_PROVIDER_SITE_OTHER): Payer: Medicare Other | Admitting: Gastroenterology

## 2011-01-05 ENCOUNTER — Encounter: Payer: Self-pay | Admitting: Gastroenterology

## 2011-01-05 VITALS — BP 118/62 | HR 74 | Ht 71.0 in | Wt 169.0 lb

## 2011-01-05 DIAGNOSIS — R490 Dysphonia: Secondary | ICD-10-CM

## 2011-01-05 NOTE — Progress Notes (Signed)
HPI: This is a  very pleasant 76 year old man who is here with his wife today.  I last saw him at the time of a ERCP about 6 years ago, this was to remove a retained common duct stone that was seen I Intra-Op cholangiogram the day prior.  He has hoarseness for 6-7 months.  At first just occasionally, now more constant. He has not seen ENT yet.    He use to get pyrosis but has not had pyrosis or any other GERD symptoms in many many years..  Not on antiacid meds.  No dysphagia.  Overall stable weight.  Chews tobacco.        Review of systems: Pertinent positive and negative review of systems were noted in the above HPI section.  All other review of systems was otherwise negative.   Past Medical History  Diagnosis Date  . Tachycardia, unspecified   . Hyperlipidemia   . Hypertension   . Chest pain, unspecified   . Other pulmonary embolism and infarction   . Dizziness and giddiness   . Chronic airway obstruction, not elsewhere classified   . Esophageal reflux   . Shortness of breath   . Foreign body in larynx   . Arthritis   . COPD (chronic obstructive pulmonary disease)   . Personal history of kidney stones   . Diverticulosis   . Benign neoplasm of colon     Past Surgical History  Procedure Date  . Insert / replace / remove pacemaker   . Cystoscopy   . Cholecystectomy   . Appendectomy   . Lung surgery      reports that he has quit smoking. He uses smokeless tobacco. He reports that he does not drink alcohol or use illicit drugs.  family history includes Breast cancer in his mother.  There is no history of Hypertension, and Hyperlipidemia, and Heart failure, and Heart disease, and Colon cancer, .    Current Medications, Allergies were all reviewed with the patient via [facility] electronic medical record system.    Physical Exam: BP 118/62  Pulse 74  Ht 5\' 11"  (1.803 m)  Wt 169 lb (76.658 kg)  BMI 23.57 kg/m2 Constitutional: generally  well-appearing Psychiatric: alert and oriented x3 Eyes: extraocular movements intact Mouth: oral pharynx moist, no lesions Neck: supple no lymphadenopathy Cardiovascular: heart regular rate and rhythm Lungs: clear to auscultation bilaterally Abdomen: soft, nontender, nondistended, no obvious ascites, no peritoneal signs, normal bowel sounds Extremities: no lower extremity edema bilaterally Skin: no lesions on visible extremities    Assessment and plan: 77 y.o. male with hoarseness in the absence of any GERD-like symptoms  He has not been evaluated by ear nose and throat physician yet and we will make that referral.  Sometimes GERD can cause hoarseness. He has no other GERD symptoms such as pyrosis or dysphagia or nausea or belching and so I suspect that acid is not his issue however I am going to start him on over-the-counter proton pump inhibitor one pill once daily as a trial. If ENT evaluation is unrevealing then we could consider further antiacid support, perhaps EGD.

## 2011-01-05 NOTE — Patient Instructions (Signed)
ENT referral for hoarseness. Trial of PPI medicine, take one OTC prilosec, or prevacid (or generic equivalent) once daily.  Omeprazole 20 mg once daily. 4-5 week trial is a good trial to see if acid is causing your symptoms. A copy of this information will be made available to Dr. Jeannetta Nap.

## 2011-01-06 ENCOUNTER — Telehealth: Payer: Self-pay | Admitting: Gastroenterology

## 2011-01-06 ENCOUNTER — Ambulatory Visit: Payer: Medicare Other | Admitting: Gastroenterology

## 2011-01-06 NOTE — Telephone Encounter (Signed)
6/10 Colonoscopy , Dr. Ewing Schlein done for personal history of colon polyps: several small polyps, all TAs on biopsy,  [name], he needs recall colonoscopy 08/2011

## 2011-01-06 NOTE — Telephone Encounter (Signed)
Pt aware recall in EPIC 

## 2011-01-07 ENCOUNTER — Other Ambulatory Visit: Payer: Self-pay | Admitting: Internal Medicine

## 2011-01-07 MED ORDER — METOPROLOL TARTRATE 50 MG PO TABS: 50.0000 mg | ORAL_TABLET | Freq: Two times a day (BID) | ORAL | Status: DC

## 2011-01-08 ENCOUNTER — Other Ambulatory Visit: Payer: Self-pay | Admitting: Internal Medicine

## 2011-01-08 MED ORDER — METOPROLOL TARTRATE 50 MG PO TABS: 50.0000 mg | ORAL_TABLET | Freq: Every day | ORAL | Status: DC

## 2011-02-19 ENCOUNTER — Ambulatory Visit (INDEPENDENT_AMBULATORY_CARE_PROVIDER_SITE_OTHER): Payer: Medicare Other | Admitting: *Deleted

## 2011-02-19 DIAGNOSIS — I442 Atrioventricular block, complete: Secondary | ICD-10-CM

## 2011-02-19 DIAGNOSIS — Z950 Presence of cardiac pacemaker: Secondary | ICD-10-CM

## 2011-02-20 ENCOUNTER — Encounter: Payer: Self-pay | Admitting: Internal Medicine

## 2011-02-20 ENCOUNTER — Other Ambulatory Visit: Payer: Self-pay

## 2011-02-20 LAB — REMOTE PACEMAKER DEVICE
AL AMPLITUDE: 3.1 mv
BAMS-0003: 65 {beats}/min
BATTERY VOLTAGE: 2.95 V
RV LEAD AMPLITUDE: 5.8 mv

## 2011-02-24 NOTE — Progress Notes (Signed)
Remote pacer check  

## 2011-03-04 ENCOUNTER — Encounter: Payer: Self-pay | Admitting: Internal Medicine

## 2011-03-05 ENCOUNTER — Ambulatory Visit (INDEPENDENT_AMBULATORY_CARE_PROVIDER_SITE_OTHER): Payer: Medicare Other | Admitting: Physician Assistant

## 2011-03-05 ENCOUNTER — Encounter: Payer: Self-pay | Admitting: Physician Assistant

## 2011-03-05 VITALS — BP 127/63 | HR 58 | Ht 71.0 in | Wt 169.1 lb

## 2011-03-05 DIAGNOSIS — I10 Essential (primary) hypertension: Secondary | ICD-10-CM

## 2011-03-05 DIAGNOSIS — I2699 Other pulmonary embolism without acute cor pulmonale: Secondary | ICD-10-CM

## 2011-03-05 DIAGNOSIS — R6884 Jaw pain: Secondary | ICD-10-CM | POA: Insufficient documentation

## 2011-03-05 DIAGNOSIS — K219 Gastro-esophageal reflux disease without esophagitis: Secondary | ICD-10-CM

## 2011-03-05 DIAGNOSIS — R079 Chest pain, unspecified: Secondary | ICD-10-CM

## 2011-03-05 DIAGNOSIS — E785 Hyperlipidemia, unspecified: Secondary | ICD-10-CM

## 2011-03-05 DIAGNOSIS — I2510 Atherosclerotic heart disease of native coronary artery without angina pectoris: Secondary | ICD-10-CM

## 2011-03-05 DIAGNOSIS — J449 Chronic obstructive pulmonary disease, unspecified: Secondary | ICD-10-CM

## 2011-03-05 NOTE — Assessment & Plan Note (Signed)
Managed by primary care. 

## 2011-03-05 NOTE — Assessment & Plan Note (Signed)
This may be contributing to his dyspnea.  He can followup with his PCP for further management.  Proceed with stress testing as noted.

## 2011-03-05 NOTE — Assessment & Plan Note (Signed)
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  I have recommended that he followup with his PCP and his ENT for further management.  We discussed conservative management including jaw rest and a short course of nonsteroidal anti-inflammatory drugs.  He knows to stop this after 3 days as he is on Coumadin.

## 2011-03-05 NOTE — Assessment & Plan Note (Signed)
With his history of hoarseness, this sounds fairly significant.  I recommended he followup with his PCP to discuss restarting PPI therapy.

## 2011-03-05 NOTE — Assessment & Plan Note (Signed)
Proceed with stress testing as noted.

## 2011-03-05 NOTE — Progress Notes (Signed)
[address] [place], [place]  [address] Phone: [phone] Fax:  [phone]  Date:  03/05/2011   Name:  [name]       DOB:  [birth year] MRN:  [id number]  PCP:  [name] Primary Cardiologist:  [name]  Primary Electrophysiologist:  [name]    History of Present Illness: [name] is a 77 y.o. male who presents for jaw pain.  He was previously followed by [name].  He established with [name] 3/12.  He has a history of nonobstructive CAD LHC 8/01: pLAD 25%, mid 40%/25%, EF 65%.  Last Myoview 8/11: Normal, EF 71%.  Last echocardiogram 8/11: EF 60-65%, grade 2 diastolic dysfunction, mild AI, mild LAE, RVE.  He also has a history of A-V block, status post pacemaker implantation 1991, hyperlipidemia, hypertension, pulmonary embolism, chronic Coumadin therapy, COPD, GERD, bilateral internal carotid artery aneurysms followed by [name], prostate cancer status post cryotherapy.  He was last seen by Dr. Johney [name] 3/12l with plans for one year followup.  He presents with bilateral jaw pain.  This has been ongoing for the past week.  He describes this as a dull ache.  He also notes a headache.  He denies any pain over his temporal arteries.  He denies any visual changes.  He denies fevers, chills, weight loss.  He does have a history of acid reflux disease.  He has seen ENT in the past.  PPI was recommended.  He presented with a symptom of hoarseness.  This resolved with the PPI.  However, he stopped taking it and his hoarseness returned.  He has a 50 year history of smoking.  He quit 3 years ago.  He denies exertional symptoms.  He denies significant shortness of breath.  However, he notes at times he will have shortness of breath with exertion and chest pain.  He states this has been ongoing for years.  He denies orthopnea, PND or edema.  He denies syncope.  He can turn his head and caused neck pain.  Past Medical History    Diagnosis Date  . Tachycardia, unspecified   . Hyperlipidemia   . Hypertension   . Chest pain, unspecified   . Other pulmonary embolism and infarction   . Dizziness and giddiness   . Chronic airway obstruction, not elsewhere classified   . Esophageal reflux   . Shortness of breath   . Foreign body in larynx   . Arthritis   . COPD (chronic obstructive pulmonary disease)   . Personal history of kidney stones   . Diverticulosis   . Benign neoplasm of colon     Current Outpatient Prescriptions  Medication Sig Dispense Refill  . acetaminophen (TYLENOL) 325 MG tablet Take 650 mg by mouth every 6 (six) hours as needed.        Marland Kitchen aspirin 81 MG tablet Take 81 mg by mouth daily.        Marland Kitchen aspirin-sod bicarb-citric acid (ALKA-SELTZER) 325 MG TBEF Take 325 mg by mouth as needed.        . Calcium Carbonate-Vitamin D (CALTRATE 600+D) 600-400 MG-UNIT per chew tablet Chew 1 tablet by mouth 2 (two) times daily.        Marland Kitchen losartan (COZAAR) 50 MG tablet Take 50 mg by mouth daily.        . Magnesium Hydroxide (MILK OF MAGNESIA PO) Take by mouth as needed.        . metoprolol (  LOPRESSOR) 50 MG tablet Take 1 tablet (50 mg total) by mouth daily.  30 tablet  3  . warfarin (COUMADIN) 5 MG tablet Take 5 mg by mouth daily.          Forrestine Him: Allergies  Allergen Reactions  . Iohexol      Desc: PT STATED TODAY HE GETS SLIGHT SOB FROM IV CONTRAST. HE NEEDS FULL PREMEDS FROM NOW ON PER DR. MATTERN   . Penicillins     History  Substance Use Topics  . Smoking status: Former Games developer  . Smokeless tobacco: Current User  . Alcohol Use: No     ROS:  Please see the history of present illness.   He denies fevers, chills, sore throat, dysphagia.  He denies melena, hematochezia, hematuria.  He denies wheezing.  He notes an occasional cough that is nonproductive.  All other systems reviewed and negative.   PHYSICAL EXAM: VS:  BP 127/63  Pulse 58  Ht 5\' 11"  (1.803 m)  Wt 169 lb 1.9 oz (76.712 kg)  BMI 23.59  kg/m2 Well nourished, well developed, in no acute distress HEENT: PERRLA, EOMI, sclera clear, TMs obscured due to cerumen impaction bilaterally, positive tenderness to palpation of bilateral TM joints, oropharynx pink without exudate, Tongue is midline, No tenderness to palpation of bilateral temporal arteries Neck: no JVD Lymph: No adenopathy Cardiac:  normal S1, S2; RRR; 1/6 systolic murmur RUSB Lungs:  clear to auscultation bilaterally, no wheezing, rhonchi or rales Abd: soft, nontender, no hepatomegaly Ext: no edema Skin: warm and dry Neuro:  CNs 2-12 intact, no focal abnormalities noted Psych: Normal affect  EKG:   Atrial paced, heart rate 55, normal axis, no ischemic changes.  ASSESSMENT AND PLAN:

## 2011-03-05 NOTE — Assessment & Plan Note (Signed)
Coumadin managed by primary care.  The patient notes his recent INR was therapeutic.

## 2011-03-05 NOTE — Patient Instructions (Addendum)
You may have TMJ Syndrome. Avoid chewing gum for several weeks.  Stop chewing tobacco.  Chew softer foods over the next week or two. You can take Ibuprofen 200 mg twice a day for 3 days (ONLY).  Do not take longer than that because you are on coumadin.   Follow up with Dr. [name] for this as well as the wax in your ears. Follow up with Dr. Jeannetta Nap for acid reflux and shortness of breath.  You may need inhalers or testing done to evaluate you for COPD caused by smoking for 50 years.    Your physician recommends that you schedule a follow-up appointment in: 05/2011 WITH DR. ALLRED  Your physician has requested that you have a lexiscan myoview DX JAW PAIN, 414.01 CAD. For further information please visit [email or web address] Please follow instruction sheet, as given.

## 2011-03-05 NOTE — Assessment & Plan Note (Signed)
Controlled.  

## 2011-03-05 NOTE — Assessment & Plan Note (Signed)
Atypical.  However, he does have risk factors.  It has been over a year since his last assessment for ischemia.  He had minimal nonobstructive disease by [facility] 8/01.  I recommend proceeding with Myoview testing.  If this is normal, he can keep his follow up with Dr. Johney Frame in March 2013.

## 2011-03-12 ENCOUNTER — Other Ambulatory Visit (HOSPITAL_COMMUNITY): Payer: Medicare Other | Admitting: Radiology

## 2011-03-19 ENCOUNTER — Encounter: Payer: Self-pay | Admitting: *Deleted

## 2011-03-19 ENCOUNTER — Ambulatory Visit (HOSPITAL_COMMUNITY): Payer: Medicare Other | Attending: Internal Medicine | Admitting: Radiology

## 2011-03-19 DIAGNOSIS — I779 Disorder of arteries and arterioles, unspecified: Secondary | ICD-10-CM | POA: Insufficient documentation

## 2011-03-19 DIAGNOSIS — R0989 Other specified symptoms and signs involving the circulatory and respiratory systems: Secondary | ICD-10-CM | POA: Insufficient documentation

## 2011-03-19 DIAGNOSIS — I2510 Atherosclerotic heart disease of native coronary artery without angina pectoris: Secondary | ICD-10-CM

## 2011-03-19 DIAGNOSIS — Z87891 Personal history of nicotine dependence: Secondary | ICD-10-CM | POA: Insufficient documentation

## 2011-03-19 DIAGNOSIS — I10 Essential (primary) hypertension: Secondary | ICD-10-CM | POA: Insufficient documentation

## 2011-03-19 DIAGNOSIS — R079 Chest pain, unspecified: Secondary | ICD-10-CM | POA: Insufficient documentation

## 2011-03-19 DIAGNOSIS — E785 Hyperlipidemia, unspecified: Secondary | ICD-10-CM | POA: Insufficient documentation

## 2011-03-19 DIAGNOSIS — J449 Chronic obstructive pulmonary disease, unspecified: Secondary | ICD-10-CM | POA: Insufficient documentation

## 2011-03-19 DIAGNOSIS — J4489 Other specified chronic obstructive pulmonary disease: Secondary | ICD-10-CM | POA: Insufficient documentation

## 2011-03-19 DIAGNOSIS — Z950 Presence of cardiac pacemaker: Secondary | ICD-10-CM | POA: Insufficient documentation

## 2011-03-19 DIAGNOSIS — R0602 Shortness of breath: Secondary | ICD-10-CM | POA: Insufficient documentation

## 2011-03-19 DIAGNOSIS — R0609 Other forms of dyspnea: Secondary | ICD-10-CM | POA: Insufficient documentation

## 2011-03-19 DIAGNOSIS — R6884 Jaw pain: Secondary | ICD-10-CM

## 2011-03-19 MED ORDER — REGADENOSON 0.4 MG/5ML IV SOLN
0.4000 mg | Freq: Once | INTRAVENOUS | Status: AC
  Administered 2011-03-19: 0.4 mg via INTRAVENOUS

## 2011-03-19 MED ORDER — TECHNETIUM TC 99M TETROFOSMIN IV KIT
10.0000 | PACK | Freq: Once | INTRAVENOUS | Status: AC | PRN
  Administered 2011-03-19: 10 via INTRAVENOUS

## 2011-03-19 MED ORDER — TECHNETIUM TC 99M TETROFOSMIN IV KIT
30.0000 | PACK | Freq: Once | INTRAVENOUS | Status: AC | PRN
  Administered 2011-03-19: 30 via INTRAVENOUS

## 2011-03-19 NOTE — Progress Notes (Signed)
[facility] SITE 3 NUCLEAR MED [address] [place] [address] [phone]  Cardiology Nuclear Med Study  FUAD FORGET is a 77 y.o. male [id number] 10/28/1932   Nuclear Med Background Indication for Stress Test:  Evaluation for Ischemia History:  COPD, 08/11 EF: 60-65%Echo: mild AI mild LAE, 08/01 Heart Catheterization: EF 65% N/O CAD, 08/11 Myocardial Perfusion Study: EF 71% (-) ischemia and '91 Pacemaker: A-Paced Cardiac Risk Factors: Carotid Disease, History of Smoking, Hypertension and Lipids  Symptoms:  Chest Pain, DOE and SOB   Nuclear Pre-Procedure Caffeine/Decaff Intake:  None NPO After: 7:00pm   Lungs:  clear IV 0.9% NS with Angio Cath:  22g  IV Site: R Antecubital  IV Started by:  Bonnita Levan, RN  Chest Size (in):  44 Cup Size: n/a  Height: 5\' 11"  (1.803 m)  Weight:  166 lb (75.297 kg)  BMI:  Body mass index is 23.15 kg/(m^2). Tech Comments:  Took Lopressor this AM    Nuclear Med Study 1 or 2 day study: 1 day  Stress Test Type:  Lexiscan  Reading MD: Arvilla Meres, MD  Order Authorizing Provider:  Hillis Range, MD, Tereso Newcomer, PA  Resting Radionuclide: Technetium 30m Tetrofosmin  Resting Radionuclide Dose: 11.0 mCi   Stress Radionuclide:  Technetium 31m Tetrofosmin  Stress Radionuclide Dose: 33.0 mCi           Stress Protocol Rest HR: 58 Stress HR: 92  Rest BP: 111/65 Stress BP: 130/64  Exercise Time (min): n/a METS: n/a   Predicted Max HR: 144 bpm % Max HR: 63.89 bpm Rate Pressure Product: 21308   Dose of Adenosine (mg):  n/a Dose of Lexiscan: 0.4 mg  Dose of Atropine (mg): n/a Dose of Dobutamine: n/a mcg/kg/min (at max HR)  Stress Test Technologist: Milana Na, EMT-P  Nuclear Technologist:  Domenic Polite, CNMT     Rest Procedure:  Myocardial perfusion imaging was performed at rest 45 minutes following the intravenous administration of Technetium 74m Tetrofosmin. Rest ECG: Atrial Fibrilliation  Stress Procedure:   The patient received IV Lexiscan 0.4 mg over 15-seconds.  Technetium 57m Tetrofosmin injected at 30-seconds.  There were no significant changes with Lexiscan.  Quantitative spect images were obtained after a 45 minute delay. Stress ECG: No significant change from baseline ECG  QPS Raw Data Images:  No motion artifact; normal heart/lung ratio. There is a small area of extracardiac tracer uptake over the surface of the left chest. Stress Images:  Normal homogeneous uptake in all areas of the myocardium. Rest Images:  Normal homogeneous uptake in all areas of the myocardium. Subtraction (SDS):  No evidence of ischemia. Transient Ischemic Dilatation (Normal <1.22):  1.02 Lung/Heart Ratio (Normal <0.45):  0.26  Quantitative Gated Spect Images QGS EDV:  99 ml QGS ESV:  33 ml QGS cine images:  NL LV Function; NL Wall Motion QGS EF: 67%  Impression Exercise Capacity:  Lexiscan with no exercise. BP Response:  n/a Clinical Symptoms:  n/a ECG Impression:  No significant ECG changes with Lexiscan. Comparison with Prior Nuclear Study: No images to compare  Overall Impression:  Normal stress nuclear study. There is a small area of extracardiac tracer uptake over the surface of the left chest possibly tracer contamination but clinical correlation is recommended.   Truman Hayward 6:27 PM

## 2011-03-20 ENCOUNTER — Other Ambulatory Visit: Payer: Self-pay | Admitting: *Deleted

## 2011-03-20 DIAGNOSIS — Z87891 Personal history of nicotine dependence: Secondary | ICD-10-CM

## 2011-03-25 ENCOUNTER — Ambulatory Visit (HOSPITAL_COMMUNITY)
Admission: RE | Admit: 2011-03-25 | Discharge: 2011-03-25 | Disposition: A | Discharge status: Home / Self Care | Payer: Medicare Other | Source: Ambulatory Visit | Attending: Physician Assistant | Admitting: Physician Assistant

## 2011-03-25 DIAGNOSIS — R9389 Abnormal findings on diagnostic imaging of other specified body structures: Secondary | ICD-10-CM | POA: Insufficient documentation

## 2011-03-25 DIAGNOSIS — Z87891 Personal history of nicotine dependence: Secondary | ICD-10-CM | POA: Insufficient documentation

## 2011-04-12 ENCOUNTER — Other Ambulatory Visit: Payer: Self-pay

## 2011-04-12 ENCOUNTER — Encounter (HOSPITAL_COMMUNITY): Payer: Self-pay | Admitting: Emergency Medicine

## 2011-04-12 ENCOUNTER — Emergency Department (HOSPITAL_COMMUNITY)
Admission: EM | Admit: 2011-04-12 | Discharge: 2011-04-12 | Disposition: A | Discharge status: Home / Self Care | Payer: Medicare Other | Attending: Internal Medicine | Admitting: Internal Medicine

## 2011-04-12 ENCOUNTER — Emergency Department (HOSPITAL_COMMUNITY): Payer: Medicare Other

## 2011-04-12 DIAGNOSIS — J449 Chronic obstructive pulmonary disease, unspecified: Secondary | ICD-10-CM | POA: Insufficient documentation

## 2011-04-12 DIAGNOSIS — K219 Gastro-esophageal reflux disease without esophagitis: Secondary | ICD-10-CM | POA: Insufficient documentation

## 2011-04-12 DIAGNOSIS — M129 Arthropathy, unspecified: Secondary | ICD-10-CM | POA: Insufficient documentation

## 2011-04-12 DIAGNOSIS — E785 Hyperlipidemia, unspecified: Secondary | ICD-10-CM | POA: Insufficient documentation

## 2011-04-12 DIAGNOSIS — Z7982 Long term (current) use of aspirin: Secondary | ICD-10-CM | POA: Insufficient documentation

## 2011-04-12 DIAGNOSIS — R079 Chest pain, unspecified: Secondary | ICD-10-CM | POA: Insufficient documentation

## 2011-04-12 DIAGNOSIS — J4489 Other specified chronic obstructive pulmonary disease: Secondary | ICD-10-CM | POA: Insufficient documentation

## 2011-04-12 DIAGNOSIS — I10 Essential (primary) hypertension: Secondary | ICD-10-CM | POA: Insufficient documentation

## 2011-04-12 DIAGNOSIS — R0989 Other specified symptoms and signs involving the circulatory and respiratory systems: Secondary | ICD-10-CM | POA: Insufficient documentation

## 2011-04-12 DIAGNOSIS — Z79899 Other long term (current) drug therapy: Secondary | ICD-10-CM | POA: Insufficient documentation

## 2011-04-12 DIAGNOSIS — R0609 Other forms of dyspnea: Secondary | ICD-10-CM | POA: Insufficient documentation

## 2011-04-12 DIAGNOSIS — R110 Nausea: Secondary | ICD-10-CM | POA: Insufficient documentation

## 2011-04-12 DIAGNOSIS — Z86718 Personal history of other venous thrombosis and embolism: Secondary | ICD-10-CM | POA: Insufficient documentation

## 2011-04-12 LAB — CBC
MCHC: 34.6 g/dL (ref 30.0–36.0)
MCV: 89.3 fL (ref 78.0–100.0)
Platelets: 238 10*3/uL (ref 150–400)
RDW: 13.2 % (ref 11.5–15.5)
WBC: 6.7 10*3/uL (ref 4.0–10.5)

## 2011-04-12 LAB — BASIC METABOLIC PANEL
Calcium: 11.1 mg/dL — ABNORMAL HIGH (ref 8.4–10.5)
Chloride: 105 mEq/L (ref 96–112)
Creatinine, Ser: 0.92 mg/dL (ref 0.50–1.35)
GFR calc Af Amer: >90 mL/min (ref >90)
GFR calc non Af Amer: 80 mL/min — ABNORMAL LOW (ref >90)

## 2011-04-12 LAB — PRO B NATRIURETIC PEPTIDE: Pro B Natriuretic peptide (BNP): 97.8 pg/mL (ref 0–450)

## 2011-04-12 LAB — PROTIME-INR: Prothrombin Time: 35.2 seconds — ABNORMAL HIGH (ref 11.6–15.2)

## 2011-04-12 MED ORDER — ALBUTEROL SULFATE (5 MG/ML) 0.5% IN NEBU
2.5000 mg | INHALATION_SOLUTION | Freq: Once | RESPIRATORY_TRACT | Status: AC
  Administered 2011-04-12: 2.5 mg via RESPIRATORY_TRACT
  Filled 2011-04-12: qty 0.5

## 2011-04-12 MED ORDER — ASPIRIN 81 MG PO CHEW
324.0000 mg | CHEWABLE_TABLET | Freq: Once | ORAL | Status: AC
  Administered 2011-04-12: 324 mg via ORAL
  Filled 2011-04-12: qty 42

## 2011-04-12 MED ORDER — IBUPROFEN 200 MG PO TABS: 400.0000 mg | ORAL_TABLET | Freq: Four times a day (QID) | ORAL | Status: AC | PRN

## 2011-04-12 MED ORDER — ALBUTEROL 90 MCG/ACT IN AERS: 2.0000 | INHALATION_SPRAY | Freq: Four times a day (QID) | RESPIRATORY_TRACT | Status: DC | PRN

## 2011-04-12 MED ORDER — PREDNISONE (PAK) 10 MG PO TABS: 40.0000 mg | ORAL_TABLET | Freq: Every day | ORAL | Status: AC

## 2011-04-12 MED ORDER — NITROGLYCERIN 0.4 MG SL SUBL
0.4000 mg | SUBLINGUAL_TABLET | SUBLINGUAL | Status: DC | PRN
  Administered 2011-04-12: 0.4 mg via SUBLINGUAL

## 2011-04-12 NOTE — ED Notes (Signed)
Pt sts he would like to go home and schedule outpatient. MD aware

## 2011-04-12 NOTE — ED Notes (Signed)
Pt placed on cardiac monitor, bp cuff, and pulse ox. Pt placed on 2L O2 for SOB, sats 99%RA

## 2011-04-12 NOTE — ED Notes (Signed)
C/o tightness across chest, back, and ribs x 6 months.  States symptoms have been worse x 2 weeks with sob and nausea.  Also c/o pain to L and R jaw.  Intermittent L arm pain.  Denies L arm pain at present.

## 2011-04-12 NOTE — Consult Note (Addendum)
Requesting physician: Dr Preston Fleeting   Reason for consultation: Admission for chest pain and shortness of breath   PCP: Dr Shelah Lewandowsky  History of Present Illness: Mr Dyas is a 78 y/o pleasant male with hx of HTN, COPD, hx of PE on coumadin since past 8 years, Hyperlipidemia,recent hx of chest pain with stress myoview done about 2-3 week back which was normal  Came to the ED with  progressive shortness of  breath for past  1 week and chest tightness. Patient informs that he get short of breath with exertion which has been worse for past  1 week. He also started having b/l tightness mainly over the lower ribs which hs present only on exertion and that further aggravates his shortness of breath. Patient was given a dose of ASA 324 mg , albuterol neb and s/l nitro. His shortness of breath was then relieved but still had some tightness over the ribs. He denies any trauma. He informs his exercise tolerance to be limited by his current symptoms.   Home medications Aspirin 81 mg daily  warfarin 5 mg daily Lovastatin 40 qd  losartan spiriva inhaler  Ca and vitamin D Nitrostat 0.4 mg s/l prn Metoprolol 50 mg daily     Allergies:   Allergies  Allergen Reactions  . Iohexol      Desc: PT STATED TODAY HE GETS SLIGHT SOB FROM IV CONTRAST. HE NEEDS FULL PREMEDS FROM NOW ON PER DR. MATTERN   . Penicillins       Past Medical History  Diagnosis Date  . Tachycardia, unspecified   . Hyperlipidemia   . Hypertension   . Chest pain, unspecified   . Other pulmonary embolism and infarction   . Dizziness and giddiness   . Chronic airway obstruction, not elsewhere classified   . Esophageal reflux   . Shortness of breath   . Foreign body in larynx   . Arthritis   . COPD (chronic obstructive pulmonary disease)   . Personal history of kidney stones   . Diverticulosis   . Benign neoplasm of colon     Past Surgical History  Procedure Date  . Insert / replace / remove pacemaker   . Cystoscopy   .  Cholecystectomy   . Appendectomy   . Lung surgery     Medications:  Scheduled Meds:   . albuterol  2.5 mg Nebulization Once  . albuterol  2.5 mg Nebulization Once  . aspirin  324 mg Oral Once   Continuous Infusions:  PRN Meds:.nitroGLYCERIN  Social History:  reports that he has quit smoking. He uses smokeless tobacco. He reports that he does not drink alcohol or use illicit drugs.  Family History  Problem Relation Age of Onset  . Hypertension Neg Hx   . Hyperlipidemia Neg Hx   . Heart failure Neg Hx   . Heart disease Neg Hx   . Colon cancer Neg Hx   . Breast cancer Mother     Review of Systems:  Constitutional: Denies fever, chills, diaphoresis, appetite change and fatigue.  HEENT: Denies photophobia, eye pain, redness, hearing loss, ear pain, congestion, sore throat, rhinorrhea, sneezing, mouth sores, trouble swallowing, neck pain, neck stiffness and tinnitus.   Respiratory: Increased SOB, DOE, , chest tightness,  denies cough Denies  wheezing.   Cardiovascular: chest tightness, denies  palpitations and leg swelling.  Gastrointestinal: Denies nausea, vomiting, abdominal pain, diarrhea, constipation, blood in stool and abdominal distention.  Genitourinary: Denies dysuria, urgency, frequency, hematuria, flank pain and difficulty urinating.  Musculoskeletal: Denies myalgias, back pain, joint swelling, arthralgias and gait problem.  Skin: Denies pallor, rash and wound.  Neurological: Denies dizziness, seizures, syncope, weakness, light-headedness, numbness and headaches.  Hematological: Denies adenopathy. Easy bruising, personal or family bleeding history  Psychiatric/Behavioral: Denies suicidal ideation, mood changes, confusion, nervousness, sleep disturbance and agitation   Physical Exam:  Filed Vitals:   04/12/11 2030 04/12/11 2059 04/12/11 2100 04/12/11 2130  BP: 136/66 138/74 131/74 145/66  Pulse: 68 90 78 67  Temp:  97.9 F (36.6 C)    TempSrc:  Oral    Resp: 15  18 21 15   SpO2: 100% 100% 99% 100%    No intake or output data in the 24 hours ending 04/12/11 2158  General: Alert, awake, oriented x3, in no acute distress. HEENT: no pallor moist oral mucosa Heart: Regular rate and rhythm, without murmurs, rubs, gallops. Reproducible pain on pressure  Over  b/l lower ribs Lungs: Clear to auscultation bilaterally. Abdomen: Soft, nontender, nondistended, positive bowel sounds. Extremities: No clubbing cyanosis or edema with positive pedal pulses. Neuro: Grossly intact, nonfocal.  Labs on Admission:  CBC:    Component Value Date/Time   WBC 6.7 04/12/2011 1541   HGB 14.2 04/12/2011 1541   HCT 41.0 04/12/2011 1541   PLT 238 04/12/2011 1541   MCV 89.3 04/12/2011 1541   NEUTROABS 5.1 05/12/2010 1044   LYMPHSABS 2.0 05/12/2010 1044   MONOABS 0.7 05/12/2010 1044   EOSABS 0.2 05/12/2010 1044   BASOSABS 0.1 05/12/2010 1044    Basic Metabolic Panel:    Component Value Date/Time   NA 138 04/12/2011 1541   K 3.9 04/12/2011 1541   CL 105 04/12/2011 1541   CO2 26 04/12/2011 1541   BUN 16 04/12/2011 1541   CREATININE 0.92 04/12/2011 1541   GLUCOSE 110* 04/12/2011 1541   CALCIUM 11.1* 04/12/2011 1541    Radiological Exams on Admission: Chest Portable 1 View  04/12/2011  *RADIOLOGY REPORT*  Clinical Data: Chest pain, shortness of breath  PORTABLE CHEST - 1 VIEW  Comparison: 03/25/2011  Findings: Right-sided dual lead pacer noted.  Heart size is normal. Left lower lobe scarring stable.  New minimal right lower lobe atelectasis is present.  Lungs otherwise clear with the exception of stable left upper lobe scarring.  IMPRESSION: New minimal right lower lobe atelectasis.  Otherwise no change.  Original Report Authenticated By: Harrel Lemon, M.D.    Assessment  78 y/o pleasant male with hx of HTN, COPD, hx of PE on coumadin since past 8 years, Hyperlipidemia,recent hx of chest pain with stress myoview done about 2-3 week back which was normal  Came to the ED with   progressive shortness of  breath for past  1 week and chest tightness.   Recommendations; Chest tightness with dyspnea on exertion symptoms likely secondary to mild COPD exacerbation. His INR is therapeutic ( mildly supra therapeutic actually ) and symptoms unlikely of ACS or acute PE.  Patient's symptoms of SOB improved after albuterol nebs in the ED HIS Chest tightness appears quite atypical and likely musculoskeletal in nature involving b/l lower ribs and reproducible to palpation EKG and initial troponin negative. Pro BNP wnl  also given that he had a very recent cardiac stress test done which was negative is reassuring His o2 sat is normal on RA and lung exam is normal as well. CXR shows basal atelectasis  patient does not need to be admitted for further management or cardiac workup. He feels bette and agrees to  go home and follow up with his PCP in 1-2 days.  he likely has mild COPD exacerbation and is at home only on spiriva inhalers. i will give him a course of po prednisone for 4 days and a prescription for albuterol inhaler.  given his persistent dyspnea on exertion, he would benefit from being on inhaled corticosteroids .  Patient clinically stable to be discharged home with outpatient follow up with his PCP .   Time Spent on Admission: 45 minutes  Khaya Theissen 04/12/2011, 9:58 PM

## 2011-04-12 NOTE — ED Provider Notes (Signed)
History     CSN: [id number]  Arrival date & time 04/12/11  1509   First MD Initiated Contact with Patient 04/12/11 1513      Chief Complaint  Patient presents with  . Chest Pain    (Consider location/radiation/quality/duration/timing/severity/associated sxs/prior treatment) Patient is a 78 y.o. male presenting with chest pain. The history is provided by the patient.  Chest Pain   He describes the chest pain as a tight feeling which is currently rated at 6/10. It gets worse with exertion and gets as bad as 8/10. This is been present for 6 months and tends to wax and wane, but has been getting significantly worse over the last several weeks. He uses Spiriva for COPD but does not have a rescue inhaler. He is not done anything to try and help the tightness except to use the Spiriva daily. Today he had some mild nausea which is unusual for him. He denies any diaphoresis or cough or fever. He says that he had a nuclear stress test recently which came back normal. He has occasional nocturnal symptoms that he awakens because of dyspnea but he does not awaken because of chest pains. Dyspnea is not worse with lying supine. Symptoms are described as moderate to severe.  Past Medical History  Diagnosis Date  . Tachycardia, unspecified   . Hyperlipidemia   . Hypertension   . Chest pain, unspecified   . Other pulmonary embolism and infarction   . Dizziness and giddiness   . Chronic airway obstruction, not elsewhere classified   . Esophageal reflux   . Shortness of breath   . Foreign body in larynx   . Arthritis   . COPD (chronic obstructive pulmonary disease)   . Personal history of kidney stones   . Diverticulosis   . Benign neoplasm of colon     Past Surgical History  Procedure Date  . Insert / replace / remove pacemaker   . Cystoscopy   . Cholecystectomy   . Appendectomy   . Lung surgery     Family History  Problem Relation Age of Onset  . Hypertension Neg Hx   .  Hyperlipidemia Neg Hx   . Heart failure Neg Hx   . Heart disease Neg Hx   . Colon cancer Neg Hx   . Breast cancer Mother     History  Substance Use Topics  . Smoking status: Former Games developer  . Smokeless tobacco: Current User  . Alcohol Use: No      Review of Systems  Cardiovascular: Positive for chest pain.  All other systems reviewed and are negative.    Allergies  Iohexol and Penicillins  Home Medications   Current Outpatient Rx  Name Route Sig Dispense Refill  . ACETAMINOPHEN 325 MG PO TABS Oral Take 650 mg by mouth every 6 (six) hours as needed. For pain    . ASPIRIN 81 MG PO TABS Oral Take 81 mg by mouth daily.      . ASPIRIN EFFERVESCENT 325 MG PO TBEF Oral Take 325 mg by mouth once as needed. For cold symptoms    . CALCIUM 500 + D PO Oral Take by mouth daily.      Marland Kitchen LOVASTATIN 40 MG PO TABS Oral Take 40 mg by mouth at bedtime.     Marland Kitchen MILK OF MAGNESIA PO Oral Take 30 mLs by mouth every 6 (six) hours as needed. For upset stomach    . METOPROLOL TARTRATE 50 MG PO TABS Oral Take 1 tablet (  50 mg total) by mouth daily. 30 tablet 3  . NITROGLYCERIN 0.4 MG SL SUBL Sublingual Place 0.4 mg under the tongue every 5 (five) minutes as needed. For chest pain    . TIOTROPIUM BROMIDE MONOHYDRATE 18 MCG IN CAPS Inhalation Place 18 mcg into inhaler and inhale daily.    . WARFARIN SODIUM 5 MG PO TABS Oral Take 5 mg by mouth daily.        BP 153/80  Pulse 72  Temp(Src) 98.1 F (36.7 C) (Oral)  Resp 18  SpO2 100%  Physical Exam  Nursing note and vitals reviewed.  78 year old male who is resting comfortably and in no acute distress. Vital signs are significant for mild hypertension blood pressure 153/80. Oxygen saturations 100% which is normal. Head is normocephalic and atraumatic. PERRLA, EOMI. Oropharynx is clear. Neck is supple without adenopathy or JVD and is nontender. Back is nontender. Lungs are clear without rales, wheezes, rhonchi. Heart has regular rate and rhythm without  murmur. Abdomen is soft, flat, nontender without masses or hepatosplenomegaly. Extremities have no cyanosis or edema, full range of motion is present. Skin is warm and moist without rash. Neurologic: Mental status is normal, cranial nerves are intact, there no focal motor or sensory deficits. Psychiatric: No abnormalities of mood or affect.  ED Course  Procedures (including critical care time) Results for orders placed during the hospital encounter of 04/12/11  CBC      Component Value Range   WBC 6.7  4.0 - 10.5 (K/uL)   RBC 4.59  4.22 - 5.81 (MIL/uL)   Hemoglobin 14.2  13.0 - 17.0 (g/dL)   HCT 16.1  09.6 - 04.5 (%)   MCV 89.3  78.0 - 100.0 (fL)   MCH 30.9  26.0 - 34.0 (pg)   MCHC 34.6  30.0 - 36.0 (g/dL)   RDW 40.9  81.1 - 91.4 (%)   Platelets 238  150 - 400 (K/uL)  BASIC METABOLIC PANEL      Component Value Range   Sodium 138  135 - 145 (mEq/L)   Potassium 3.9  3.5 - 5.1 (mEq/L)   Chloride 105  96 - 112 (mEq/L)   CO2 26  19 - 32 (mEq/L)   Glucose, Bld 110 (*) 70 - 99 (mg/dL)   BUN 16  6 - 23 (mg/dL)   Creatinine, Ser 7.82  0.50 - 1.35 (mg/dL)   Calcium 95.6 (*) 8.4 - 10.5 (mg/dL)   GFR calc non Af Amer 80 (*) >90 (mL/min)   GFR calc Af Amer >90  >90 (mL/min)  PRO B NATRIURETIC PEPTIDE      Component Value Range   Pro B Natriuretic peptide (BNP) 97.8  0 - 450 (pg/mL)  TROPONIN I      Component Value Range   Troponin I <0.30  <0.30 (ng/mL)  PROTIME-INR      Component Value Range   Prothrombin Time 35.2 (*) 11.6 - 15.2 (seconds)   INR 3.44 (*) 0.00 - 1.49    Dg Chest 2 View  03/25/2011  *RADIOLOGY REPORT*  Clinical Data: Extracardiac tracer uptake noted in the left chest on myocardial views.  CHEST - 2 VIEW  Comparison: 05/12/2010 and 10/24/2009  Findings: A dual lead pacer is in place via a right subclavian approach with lead tips located in the region of the right atrium and right ventricle.  Lead wires appear intact.  Heart and mediastinal contours are stable.  The lung  fields appear stable with an area of focal density  in the left upper lung zone which is unchanged since 10/24/2009 and compatible with some pleural or parenchymal scarring.  Scarring at the left costophrenic angle is also stable.  The lung fields are otherwise clear with prominent interstitial markings suggesting some underlying bronchitic change.  No new focal infiltrates or signs of congestive failure are seen. Stable bilateral apical pleural thickening, left greater than right is evident  Bony structures are notable for a healed rib fracture associated with the posteriorlateral aspect of the left eighth rib. Degenerative osteophytosis of the mid and lower thoracic spine is seen.  IMPRESSION: Stable pleural parenchymal scarring in the left upper lung zone and at the left costophrenic angle.  No new worrisome or acute abnormality suggested.  Old healed rib fracture on the left could potentially be responsible for increased uptake with technetium on prior cardiac exam.  Views from the cardiac exam are not available for direct correlation and clinical correlation will be necessary.  Original Report Authenticated By: Bertha Stakes, M.D.   Chest Portable 1 View  04/12/2011  *RADIOLOGY REPORT*  Clinical Data: Chest pain, shortness of breath  PORTABLE CHEST - 1 VIEW  Comparison: 03/25/2011  Findings: Right-sided dual lead pacer noted.  Heart size is normal. Left lower lobe scarring stable.  New minimal right lower lobe atelectasis is present.  Lungs otherwise clear with the exception of stable left upper lobe scarring.  IMPRESSION: New minimal right lower lobe atelectasis.  Otherwise no change.  Original Report Authenticated By: Harrel Lemon, M.D.      1. Chest pain   2. COPD (chronic obstructive pulmonary disease)      Date: 04/12/2011  Rate: 69  Rhythm: normal sinus rhythm  QRS Axis: normal  Intervals: normal  ST/T Wave abnormalities: Slight ST depression in the inferior leads  Conduction  Disutrbances:none  Narrative Interpretation:v First degree AV block, Left ventricular hypertrophy, slight ST depression in the inferior leads which is new compared with ECG of 11/11/2010. Tall, peaked T wave,s which were present on prior ECG, are no longer present.  Old EKG Reviewed: changes noted  After an albuterol nebulizer treatment, he states that his breathing felt better but he still had chest tightness. He was given nitroglycerin and an additional albuterol nebulizer treatment and at this point he is having predominantly chest soreness. His breathing is much better. I still feel that his symptoms are most likely related to COPD, but feel that there is enough concern about possible cardiac etiology that he should be admitted for serial cardiac markers and consideration for cardiology consultation.  Case is discussed with [facility] and he will be admitted to a telemetry bed for serial cardiac markers.  MDM  Old records have been reviewed. He had a negative Myoview stress test last month. Heart catheterization 2001 showed the worst lesion was at 40%. Current symptoms are likely related to COPD however exertional chest tightness is worrisome for possible cardiac cause.        Dione Booze, MD 04/12/11 2108

## 2011-04-12 NOTE — ED Notes (Signed)
PT provided with [email or web address]

## 2011-05-22 ENCOUNTER — Encounter: Payer: Self-pay | Admitting: Internal Medicine

## 2011-05-22 ENCOUNTER — Ambulatory Visit (INDEPENDENT_AMBULATORY_CARE_PROVIDER_SITE_OTHER): Payer: Medicare Other | Admitting: Internal Medicine

## 2011-05-22 VITALS — BP 140/86 | HR 70 | Temp 97.6°F | Ht 71.0 in | Wt 172.6 lb

## 2011-05-22 DIAGNOSIS — R0602 Shortness of breath: Secondary | ICD-10-CM

## 2011-05-22 DIAGNOSIS — I10 Essential (primary) hypertension: Secondary | ICD-10-CM

## 2011-05-22 DIAGNOSIS — R0989 Other specified symptoms and signs involving the circulatory and respiratory systems: Secondary | ICD-10-CM

## 2011-05-22 DIAGNOSIS — R0600 Dyspnea, unspecified: Secondary | ICD-10-CM

## 2011-05-22 NOTE — Patient Instructions (Signed)
Stop spiriva and lopressor  Bystolic 10 mg one daily   Ok to use proaire if you can't catch your breath  Work on inhaler technique:  relax and gently blow all the way out then take a nice smooth deep breath back in, triggering the inhaler at same time you start breathing in.  Hold for up to 5 seconds if you can.  Rinse and gargle with water when done   If your mouth or throat starts to bother you,   I suggest you time the inhaler to your dental care and after using the inhaler(s) brush teeth and tongue with a baking soda containing toothpaste and when you rinse this out, gargle with it first to see if this helps your mouth and throat.     Try prilosec 20mg   Take 30-60 min before first meal of the day and Pepcid 20 mg one bedtime  GERD (REFLUX)  is an extremely common cause of respiratory symptoms(like copd) , many times with no significant heartburn at all.    It can be treated with medication, but also with lifestyle changes including avoidance of late meals, excessive alcohol, smoking cessation, and avoid fatty foods, chocolate, peppermint, colas, red wine, and acidic juices such as orange juice.  NO MINT OR MENTHOL PRODUCTS SO NO COUGH DROPS  USE SUGARLESS CANDY INSTEAD (jolley ranchers or Stover's)  NO OIL BASED VITAMINS - use powdered substitutes.  Please schedule a follow up office visit in 4 weeks, sooner if needed with all medications in hand for PFT's

## 2011-05-22 NOTE — Progress Notes (Signed)
  Subjective:    Patient ID: [name], male    DOB: Aug 02, 1932 MRN: [id number]  HPI  10 yowm quit smoking around 2006 with variable sob ever since then  and no significant airflow obst by pft's in 2009   05/22/2011 1st pulmonary ov cc sob at rest and walks to mb and back sometimes does well, sometimes not much worse x 2 weeks not better on pred, some better with albuterol, assoc with sense of pnds but no purulent sputum.  Feels more choked than tight in chest. No assoc nausea or ex cp/ diaphoresis.  On lopressor doesn't feel saba helps so started on spiriva > no better.  Cardiac w/u 03/05/11 with nuclear stress test neg for ishemia with bnp < 100 04/12/11  Sleeping ok without nocturnal  or early am exacerbation  of respiratory  c/o's or need for noct saba. Also denies any obvious fluctuation of symptoms with weather or environmental changes or other aggravating or alleviating factors except as outlined above   Review of Systems  Constitutional: Negative for fever, chills, activity change, appetite change and unexpected weight change.  HENT: Positive for rhinorrhea. Negative for congestion, sore throat, sneezing, trouble swallowing, dental problem, voice change and postnasal drip.   Eyes: Negative for visual disturbance.  Respiratory: Positive for chest tightness and shortness of breath. Negative for cough and choking.   Cardiovascular: Negative for chest pain and leg swelling.  Gastrointestinal: Negative for nausea, vomiting and abdominal pain.  Genitourinary: Negative for difficulty urinating.  Musculoskeletal: Positive for arthralgias.  Skin: Negative for rash.  Psychiatric/Behavioral: Negative for behavioral problems and confusion.       Objective:   Physical Exam  Anxious amb wm with classic pseudowheeze Wt  172 05/22/2011 HEENT mild turbinate edema.  Oropharynx no thrush or excess pnd or cobblestoning.  No JVD or cervical adenopathy. Mild accessory muscle hypertrophy. Trachea  midline, nl thryroid. Chest was min hyperinflated by percussion with diminished breath sounds and min  increased exp time without wheeze. Hoover sign positive at very end of  inspiration. Regular rate and rhythm without murmur gallop or rub or increase P2 or edema.  Abd: no hsm, nl excursion. Ext warm without cyanosis or clubbing.    04/12/11 New minimal right lower lobe atelectasis. Otherwise no change     Assessment & Plan:

## 2011-05-23 NOTE — Assessment & Plan Note (Signed)
Until sort our his variable sob, Strongly prefer in this setting: Bystolic, the most beta -1  selective Beta blocker available in sample form, with bisoprolol the most selective generic choice  on the market.

## 2011-05-23 NOTE — Assessment & Plan Note (Addendum)
Variable doe worse since stopped smoking.   When respiratory symptoms begin or become refractory well after a patient reports complete smoking cessation,  Especially when this wasn't the case while they were smoking, a red flag is raised based on the work of Dr Primitivo Gauze which strongly supports: if you quit smoking when your best day FEV1 is still well preserved (which by pft's in 2009 was the case with him) it is highly unlikely you will progress to severe disease.  That is to say, once the smoking stops,  the symptoms should not suddenly erupt or markedly worsen.  If so, the differential diagnosis should include  obesity/deconditioning,  LPR/Reflux/Aspiration syndromes,  occult CHF, or  especially side effect of medications commonly used in this population.      Symptoms are markedly disproportionate to objective findings and not clear this is a lung problem but pt does appear to have difficult airway management issues.  DDX of  difficult airways managment all start with A and  include Adherence, Ace Inhibitors, Acid Reflux, Active Sinus Disease, Alpha 1 Antitripsin deficiency, Anxiety masquerading as Airways dz,  ABPA,  allergy(esp in young), Aspiration (esp in elderly), Adverse effects of DPI,  Active smokers, plus two Bs  = Bronchiectasis and Beta blocker use..and one C= CHF   Adherence is always the initial "prime suspect" and is a multilayered concern that requires a "trust but verify" approach in every patient - starting with knowing how to use medications, especially inhalers, correctly, keeping up with refills and understanding the fundamental difference between maintenance and prns vs those medications only taken for a very short course and then stopped and not refilled.   ? Acid reflux related upper airways dysfunction supported by pseudowheeze on exam > try ppi/ h2 hs and diet  ? Adverse effect of dpi > d/c spiriva  ? Beta blocker effect > Strongly prefer in this setting: Bystolic, the  most beta -1  selective Beta blocker available in sample form, with bisoprolol the most selective generic choice  on the market.

## 2011-06-01 ENCOUNTER — Other Ambulatory Visit: Payer: Self-pay | Admitting: *Deleted

## 2011-06-01 DIAGNOSIS — I720 Aneurysm of carotid artery: Secondary | ICD-10-CM

## 2011-06-13 ENCOUNTER — Encounter (HOSPITAL_COMMUNITY): Payer: Self-pay

## 2011-06-13 ENCOUNTER — Ambulatory Visit (HOSPITAL_COMMUNITY)
Admission: EM | Admit: 2011-06-13 | Discharge: 2011-06-14 | DRG: 315 | Disposition: A | Discharge status: Home / Self Care | Payer: Medicare Other | Attending: Family Medicine | Admitting: Family Medicine

## 2011-06-13 ENCOUNTER — Other Ambulatory Visit: Payer: Self-pay

## 2011-06-13 ENCOUNTER — Emergency Department (HOSPITAL_COMMUNITY): Payer: Medicare Other

## 2011-06-13 DIAGNOSIS — R5381 Other malaise: Secondary | ICD-10-CM | POA: Insufficient documentation

## 2011-06-13 DIAGNOSIS — I959 Hypotension, unspecified: Secondary | ICD-10-CM | POA: Insufficient documentation

## 2011-06-13 DIAGNOSIS — T17308A Unspecified foreign body in larynx causing other injury, initial encounter: Secondary | ICD-10-CM

## 2011-06-13 DIAGNOSIS — I10 Essential (primary) hypertension: Secondary | ICD-10-CM

## 2011-06-13 DIAGNOSIS — R079 Chest pain, unspecified: Secondary | ICD-10-CM

## 2011-06-13 DIAGNOSIS — R6884 Jaw pain: Secondary | ICD-10-CM

## 2011-06-13 DIAGNOSIS — J4489 Other specified chronic obstructive pulmonary disease: Secondary | ICD-10-CM

## 2011-06-13 DIAGNOSIS — I2699 Other pulmonary embolism without acute cor pulmonale: Secondary | ICD-10-CM

## 2011-06-13 DIAGNOSIS — J449 Chronic obstructive pulmonary disease, unspecified: Secondary | ICD-10-CM

## 2011-06-13 DIAGNOSIS — Z950 Presence of cardiac pacemaker: Secondary | ICD-10-CM

## 2011-06-13 DIAGNOSIS — R5383 Other fatigue: Secondary | ICD-10-CM | POA: Insufficient documentation

## 2011-06-13 DIAGNOSIS — E785 Hyperlipidemia, unspecified: Secondary | ICD-10-CM

## 2011-06-13 DIAGNOSIS — R531 Weakness: Secondary | ICD-10-CM

## 2011-06-13 DIAGNOSIS — K219 Gastro-esophageal reflux disease without esophagitis: Secondary | ICD-10-CM

## 2011-06-13 DIAGNOSIS — Z86711 Personal history of pulmonary embolism: Secondary | ICD-10-CM | POA: Insufficient documentation

## 2011-06-13 DIAGNOSIS — I441 Atrioventricular block, second degree: Secondary | ICD-10-CM

## 2011-06-13 DIAGNOSIS — I2510 Atherosclerotic heart disease of native coronary artery without angina pectoris: Secondary | ICD-10-CM

## 2011-06-13 DIAGNOSIS — R0602 Shortness of breath: Secondary | ICD-10-CM

## 2011-06-13 DIAGNOSIS — I442 Atrioventricular block, complete: Secondary | ICD-10-CM

## 2011-06-13 LAB — URINALYSIS, ROUTINE W REFLEX MICROSCOPIC
Bilirubin Urine: NEGATIVE
Ketones, ur: NEGATIVE mg/dL
Leukocytes, UA: NEGATIVE
Nitrite: NEGATIVE
Protein, ur: NEGATIVE mg/dL
Urobilinogen, UA: 1 mg/dL (ref 0.0–1.0)

## 2011-06-13 LAB — COMPREHENSIVE METABOLIC PANEL
Alkaline Phosphatase: 77 U/L (ref 39–117)
BUN: 15 mg/dL (ref 6–23)
CO2: 25 mEq/L (ref 19–32)
GFR calc Af Amer: >90 mL/min (ref >90)
GFR calc non Af Amer: 79 mL/min — ABNORMAL LOW (ref >90)
Glucose, Bld: 104 mg/dL — ABNORMAL HIGH (ref 70–99)
Potassium: 4.9 mEq/L (ref 3.5–5.1)
Total Bilirubin: 0.9 mg/dL (ref 0.3–1.2)
Total Protein: 6.1 g/dL (ref 6.0–8.3)

## 2011-06-13 LAB — DIFFERENTIAL
Eosinophils Absolute: 0.2 10*3/uL (ref 0.0–0.7)
Lymphocytes Relative: 29 % (ref 12–46)
Lymphs Abs: 2.3 10*3/uL (ref 0.7–4.0)
Monocytes Relative: 9 % (ref 3–12)
Neutrophils Relative %: 58 % (ref 43–77)

## 2011-06-13 LAB — CBC
Hemoglobin: 12.6 g/dL — ABNORMAL LOW (ref 13.0–17.0)
MCH: 30.9 pg (ref 26.0–34.0)
MCV: 89.7 fL (ref 78.0–100.0)
RBC: 4.08 MIL/uL — ABNORMAL LOW (ref 4.22–5.81)

## 2011-06-13 LAB — CARDIAC PANEL(CRET KIN+CKTOT+MB+TROPI)
CK, MB: 2.4 ng/mL (ref 0.3–4.0)
Relative Index: INVALID (ref 0.0–2.5)
Relative Index: INVALID (ref 0.0–2.5)
Total CK: 67 U/L (ref 7–232)
Troponin I: <0.3 ng/mL (ref <0.30)
Troponin I: <0.3 ng/mL (ref <0.30)

## 2011-06-13 LAB — OCCULT BLOOD, POC DEVICE: Fecal Occult Bld: NEGATIVE

## 2011-06-13 MED ORDER — LOSARTAN POTASSIUM 50 MG PO TABS
50.0000 mg | ORAL_TABLET | Freq: Every day | ORAL | Status: DC
  Administered 2011-06-14: 50 mg via ORAL
  Filled 2011-06-13: qty 1

## 2011-06-13 MED ORDER — SODIUM CHLORIDE 0.9 % IV BOLUS (SEPSIS)
500.0000 mL | Freq: Once | INTRAVENOUS | Status: AC
  Administered 2011-06-13: 500 mL via INTRAVENOUS

## 2011-06-13 MED ORDER — ASPIRIN EC 81 MG PO TBEC
81.0000 mg | DELAYED_RELEASE_TABLET | Freq: Every day | ORAL | Status: DC
  Administered 2011-06-14 (×2): 81 mg via ORAL
  Filled 2011-06-13 (×2): qty 1

## 2011-06-13 MED ORDER — METOPROLOL TARTRATE 50 MG PO TABS
50.0000 mg | ORAL_TABLET | Freq: Every day | ORAL | Status: DC
  Administered 2011-06-14: 50 mg via ORAL
  Filled 2011-06-13: qty 1

## 2011-06-13 MED ORDER — SODIUM CHLORIDE 0.9 % IV BOLUS (SEPSIS)
250.0000 mL | Freq: Once | INTRAVENOUS | Status: AC
  Administered 2011-06-13: 250 mL via INTRAVENOUS

## 2011-06-13 MED ORDER — WARFARIN SODIUM 2.5 MG PO TABS
2.5000 mg | ORAL_TABLET | Freq: Once | ORAL | Status: AC
  Administered 2011-06-13: 2.5 mg via ORAL
  Filled 2011-06-13: qty 1

## 2011-06-13 MED ORDER — SIMVASTATIN 20 MG PO TABS
20.0000 mg | ORAL_TABLET | Freq: Every day | ORAL | Status: DC
  Filled 2011-06-13: qty 1

## 2011-06-13 MED ORDER — LEVOFLOXACIN IN D5W 750 MG/150ML IV SOLN
750.0000 mg | INTRAVENOUS | Status: DC
  Administered 2011-06-13: 750 mg via INTRAVENOUS
  Filled 2011-06-13 (×2): qty 150

## 2011-06-13 MED ORDER — NITROGLYCERIN 0.4 MG SL SUBL: 0.4000 mg | SUBLINGUAL_TABLET | SUBLINGUAL | Status: DC | PRN

## 2011-06-13 MED ORDER — WARFARIN - PHARMACIST DOSING INPATIENT: Freq: Every day | Status: DC

## 2011-06-13 MED ORDER — SODIUM CHLORIDE 0.9 % IV SOLN
INTRAVENOUS | Status: DC
  Administered 2011-06-14: 1000 mL via INTRAVENOUS

## 2011-06-13 MED ORDER — SODIUM CHLORIDE 0.9 % IV BOLUS (SEPSIS): 250.0000 mL | Freq: Once | INTRAVENOUS | Status: DC

## 2011-06-13 MED ORDER — ALBUTEROL SULFATE (5 MG/ML) 0.5% IN NEBU: 2.5000 mg | INHALATION_SOLUTION | RESPIRATORY_TRACT | Status: DC | PRN

## 2011-06-13 NOTE — H&P (Signed)
Notch,[name], 78 y.o., 1932-12-10  PCP:   [name], MD, MD   Chief Complaint:  Weakness, jaw/chest discomfort  HPI: Patient is a 78 y/o CM with a history of HPL, HTN, Reflux presenting to the hospital secondary to weakness.  States that recently his primary care physician started him on imdur which he endorses was for his jaw pain.  He took 1.5 tabs of his imdur and he felt weak all day.  So he decided to come to the ED for further evaluation.  While in the ED his blood pressure was found to be 86/50 and he was subsequently given IVF's of which his blood pressure improved on this regimen.   He states that he has been getting progressively more short of breath with activity which he has noticed over the last 3 months.  His jaw pain has been present more frequently as well.  Mentions that he had a stress test in December 2012 and was told he was fine.  But states that during the last 3 months he has really noticed a difference.    In the ED chest x ray was also obtained which showed:   Right infrahilar/lower lobe opacity is slightly increased.Consider short-term radiographic follow-up, to exclude developing pneumonia in this area.  Patient mentions that he has not been coughing much and denies any sputum production, fever, or chills.  Was started on IV antibiotics in the ED.  Allergies:   Allergies  Allergen Reactions  . Iohexol      Desc: PT STATED TODAY HE GETS SLIGHT SOB FROM IV CONTRAST. HE NEEDS FULL PREMEDS FROM NOW ON PER DR. MATTERN   . Penicillins       Past Medical History  Diagnosis Date  . Tachycardia, unspecified   . Hyperlipidemia   . Hypertension   . Chest pain, unspecified   . Other pulmonary embolism and infarction   . Dizziness and giddiness   . Esophageal reflux   . Shortness of breath   . Arthritis   . Personal history of kidney stones   . Diverticulosis     Past Surgical History  Procedure Date  . Insert / replace / remove pacemaker    . Cystoscopy   . Cholecystectomy   . Appendectomy   . Lung surgery age 73    Prior to Admission medications   Medication Sig Start Date End Date Taking? Authorizing Provider  albuterol (PROVENTIL,VENTOLIN) 90 MCG/ACT inhaler Inhale 2 puffs into the lungs every 6 (six) hours as needed. For wheezing or shortness of breath 04/12/11 04/06/12 Yes [name] Dhungel, MD  aspirin 81 MG tablet Take 81 mg by mouth daily.     Yes Historical Provider, MD  losartan (COZAAR) 50 MG tablet Take 50 mg by mouth daily.   Yes Historical Provider, MD  lovastatin (MEVACOR) 40 MG tablet Take 40 mg by mouth at bedtime.  12/23/10  Yes Historical Provider, MD  metoprolol (LOPRESSOR) 50 MG tablet Take 1 tablet (50 mg total) by mouth daily. 01/08/11  Yes Hillis Range, MD  nitroGLYCERIN (NITROSTAT) 0.4 MG SL tablet Place 0.4 mg under the tongue every 5 (five) minutes as needed. For chest pain   Yes Historical Provider, MD  warfarin (COUMADIN) 5 MG tablet Take 2.5-5 mg by mouth daily. Mon, Tues, Weds-1 tab (5 mg); All other days 0.5 half tab (2.5 mg)   Yes Historical Provider, MD    Social History:  reports that he quit smoking about 7 years ago. His smoking use  included Cigarettes. He has a 50 pack-year smoking history. His smokeless tobacco use includes Chew. He reports that he does not drink alcohol or use illicit drugs.  Family History  Problem Relation Age of Onset  . Hypertension Neg Hx   . Hyperlipidemia Neg Hx   . Heart failure Neg Hx   . Heart disease Neg Hx   . Colon cancer Neg Hx   . Breast cancer Mother   . Emphysema Sister     was a smoker    Review of Systems:  Constitutional: Denies fever, chills, diaphoresis, appetite change and fatigue.  HEENT: Denies photophobia, eye pain, redness, hearing loss, ear pain, congestion, sore throat, rhinorrhea, sneezing, mouth sores, trouble swallowing, neck pain, neck stiffness and tinnitus.   Respiratory: Denies SOB, DOE, cough, chest tightness,  and wheezing.     Cardiovascular: Denies chest pain, palpitations and leg swelling.  Gastrointestinal: Denies nausea, vomiting, abdominal pain, diarrhea, constipation, blood in stool and abdominal distention.  Genitourinary: Denies dysuria, urgency, frequency, hematuria, flank pain and difficulty urinating.  Musculoskeletal: Denies myalgias, back pain, joint swelling, arthralgias and gait problem.  Skin: Denies pallor, rash and wound.  Neurological: Denies dizziness, seizures, syncope, weakness, light-headedness, numbness and headaches.  Hematological: Denies adenopathy. Easy bruising, personal or family bleeding history  Psychiatric/Behavioral: Denies suicidal ideation, mood changes, confusion, nervousness, sleep disturbance and agitation   Physical Exam: Blood pressure 97/56, pulse 70, temperature 97.5 F (36.4 C), temperature source Oral, resp. rate 18, height 5\' 11"  (1.803 m), weight 77.111 kg (170 lb), SpO2 99.00%. General: Alert, awake, oriented x3, in no acute distress. HEENT: No bruits, no goiter. Heart: Regular rate and rhythm, without murmurs, rubs, gallops. Lungs: prolonged expiratory phase.  No wheezes Abdomen: Soft, nontender, nondistended, positive bowel sounds. Extremities: No clubbing cyanosis or edema with positive pedal pulses. Neuro: Grossly intact, nonfocal.  Labs on Admission:  Results for orders placed during the hospital encounter of 06/13/11 (from the past 48 hour(s))  CBC     Status: Abnormal   Collection Time   06/13/11 11:39 AM      Component Value Range Comment   WBC 7.8  4.0 - 10.5 (K/uL)    RBC 4.08 (*) 4.22 - 5.81 (MIL/uL)    Hemoglobin 12.6 (*) 13.0 - 17.0 (g/dL)    HCT 29.5 (*) 62.1 - 52.0 (%)    MCV 89.7  78.0 - 100.0 (fL)    MCH 30.9  26.0 - 34.0 (pg)    MCHC 34.4  30.0 - 36.0 (g/dL)    RDW 30.8  65.7 - 84.6 (%)    Platelets 229  150 - 400 (K/uL)   DIFFERENTIAL     Status: Normal   Collection Time   06/13/11 11:39 AM      Component Value Range Comment    Neutrophils Relative 58  43 - 77 (%)    Neutro Abs 4.5  1.7 - 7.7 (K/uL)    Lymphocytes Relative 29  12 - 46 (%)    Lymphs Abs 2.3  0.7 - 4.0 (K/uL)    Monocytes Relative 9  3 - 12 (%)    Monocytes Absolute 0.7  0.1 - 1.0 (K/uL)    Eosinophils Relative 3  0 - 5 (%)    Eosinophils Absolute 0.2  0.0 - 0.7 (K/uL)    Basophils Relative 1  0 - 1 (%)    Basophils Absolute 0.1  0.0 - 0.1 (K/uL)   COMPREHENSIVE METABOLIC PANEL     Status: Abnormal  Collection Time   06/13/11 11:39 AM      Component Value Range Comment   Sodium 139  135 - 145 (mEq/L)    Potassium 4.9  3.5 - 5.1 (mEq/L)    Chloride 107  96 - 112 (mEq/L)    CO2 25  19 - 32 (mEq/L)    Glucose, Bld 104 (*) 70 - 99 (mg/dL)    BUN 15  6 - 23 (mg/dL)    Creatinine, Ser 6.21  0.50 - 1.35 (mg/dL)    Calcium 30.8 (*) 8.4 - 10.5 (mg/dL)    Total Protein 6.1  6.0 - 8.3 (g/dL)    Albumin 3.3 (*) 3.5 - 5.2 (g/dL)    AST 17  0 - 37 (U/L)    ALT 14  0 - 53 (U/L)    Alkaline Phosphatase 77  39 - 117 (U/L)    Total Bilirubin 0.9  0.3 - 1.2 (mg/dL)    GFR calc non Af Amer 79 (*) >90 (mL/min)    GFR calc Af Amer >90  >90 (mL/min)   URINALYSIS, ROUTINE W REFLEX MICROSCOPIC     Status: Normal   Collection Time   06/13/11 11:43 AM      Component Value Range Comment   Color, Urine YELLOW  YELLOW     APPearance CLEAR  CLEAR     Specific Gravity, Urine 1.007  1.005 - 1.030     pH 7.0  5.0 - 8.0     Glucose, UA NEGATIVE  NEGATIVE (mg/dL)    Hgb urine dipstick NEGATIVE  NEGATIVE     Bilirubin Urine NEGATIVE  NEGATIVE     Ketones, ur NEGATIVE  NEGATIVE (mg/dL)    Protein, ur NEGATIVE  NEGATIVE (mg/dL)    Urobilinogen, UA 1.0  0.0 - 1.0 (mg/dL)    Nitrite NEGATIVE  NEGATIVE     Leukocytes, UA NEGATIVE  NEGATIVE  MICROSCOPIC NOT DONE ON URINES WITH NEGATIVE PROTEIN, BLOOD, LEUKOCYTES, NITRITE, OR GLUCOSE <1000 mg/dL.    Radiological Exams on Admission: Dg Chest Port 1 View  06/13/2011  *RADIOLOGY REPORT*  Clinical Data: Generalized  weakness.  Ex-smoker.  PORTABLE CHEST - 1 VIEW  Comparison: 04/12/2011  Findings: Hyperinflation.  Dual lead pacer with leads right atrium, right ventricle.  Remote left rib trauma.  Patient rotated right. Normal heart size and mediastinal contours for age.  No right-sided pleural effusion. Left-sided pleural thickening and biapical pleural parenchymal scarring are felt to be similar back to 2007. Diffuse peribronchial thickening.  Mild volume loss and pulmonary opacity in the right infrahilar region and lung base, slightly increased.  IMPRESSION:  1.  COPD with similar scarring. 2.  Right infrahilar/lower lobe opacity is slightly increased. Consider short-term radiographic follow-up, to exclude developing pneumonia in this area.  Original Report Authenticated By: Consuello Bossier, M.D.    Assessment/Plan 1) Hypotension:  Most likely a combination of poor oral intake which the patient endorsed today and medication induced (imdur).  Has resolved with IVF rehydration.  Will plan on holding imdur and gently hydrating patient overnight.  2) Pneumonia:  Questionable given lack of symptomatology, patient is afebrile, and normal WBC count.  At this point will not reorder antibiotics and will plan on checking a repeat chest x ray tomorrow morning.  3) Progressive shortness of breath:  Could be related to his underlying COPD but I am concerned that along with this Jaw pain he keeps taking about it may be worsening CHF although I do not see this  on his problem list.  Thus I will plan on ordering Echo.  4) Transient chest discomfort:  May be due to a possible lung infection but will want to rule patient out for cardiac cause.  Will cycle cardiac enzymes  5) history of PE: Will plan on continuing coumadin.   6) HPL: Continue lovastatin  Disposition:  Will place in observation and trend his cardiac enzymes.  If cardiac enzymes negative and patient with improved blood pressure will have him follow up with his  primary care physician.   Time Spent on Admission: 60 minutes documenting, obtaining history, speaking with ER physician, updating information services, placing orders, billing.  Penny Pia [facility] Pager: [phone] 06/13/2011, 6:02 PM

## 2011-06-13 NOTE — ED Provider Notes (Signed)
History     CSN: [id number]  Arrival date & time 06/13/11  1024   First MD Initiated Contact with Patient 06/13/11 1231      Chief Complaint  Patient presents with  . Weakness  . Pain    (Consider location/radiation/quality/duration/timing/severity/associated sxs/prior treatment) HPI  Patient presents to the ED with complaints of 2-3 months of weakness, mild cough and generalized body aches all over. Dr. Jeannetta Nap is the patient PCP and the patient has been evaluated multiple times for this problem. The patient has a cardiac history which includes a pace maker for an abnormal arrhythmia. The patient has come to the ED because "he just doesn't feel right". He feels as though his symptoms are progressively getting worse and that he is going to "kick the bucket". He has been progressively getting weaker and more short of breath according to the patient and his wife. The patient denies having chest pains, sore throat, abdominal pain, urinary or bowel symptoms, pt has no unhealing wounds.    Past Medical History  Diagnosis Date  . Tachycardia, unspecified   . Hyperlipidemia   . Hypertension   . Chest pain, unspecified   . Other pulmonary embolism and infarction   . Dizziness and giddiness   . Esophageal reflux   . Shortness of breath   . Arthritis   . Personal history of kidney stones   . Diverticulosis     Past Surgical History  Procedure Date  . Insert / replace / remove pacemaker   . Cystoscopy   . Cholecystectomy   . Appendectomy   . Lung surgery age 89    Family History  Problem Relation Age of Onset  . Hypertension Neg Hx   . Hyperlipidemia Neg Hx   . Heart failure Neg Hx   . Heart disease Neg Hx   . Colon cancer Neg Hx   . Breast cancer Mother   . Emphysema Sister     was a smoker    History  Substance Use Topics  . Smoking status: Former Smoker -- 1.0 packs/day for 50 years    Types: Cigarettes    Quit date: 03/23/2004  . Smokeless tobacco: Current User      Types: Chew  . Alcohol Use: No      Review of Systems  All other systems reviewed and are negative.    Allergies  Iohexol and Penicillins  Home Medications   No current outpatient prescriptions on file.  BP 121/71  Pulse 69  Temp(Src) 98.4 F (36.9 C) (Oral)  Resp 18  Ht 5\' 11"  (1.803 m)  Wt 170 lb (77.111 kg)  BMI 23.71 kg/m2  SpO2 98%  Physical Exam  Nursing note and vitals reviewed. Constitutional: He appears well-developed and well-nourished. No distress.  HENT:  Head: Normocephalic and atraumatic.  Eyes: Pupils are equal, round, and reactive to light.  Neck: Normal range of motion. Neck supple.  Cardiovascular: Normal rate and regular rhythm.   Pulmonary/Chest: Effort normal and breath sounds normal.  Abdominal: Soft.  Neurological: He is alert.  Skin: Skin is warm and dry.    ED Course  Procedures (including critical care time)  Labs Reviewed  CBC - Abnormal; Notable for the following:    RBC 4.08 (*)    Hemoglobin 12.6 (*)    HCT 36.6 (*)    All other components within normal limits  COMPREHENSIVE METABOLIC PANEL - Abnormal; Notable for the following:    Glucose, Bld 104 (*)    Calcium  10.9 (*)    Albumin 3.3 (*)    GFR calc non Af Amer 79 (*)    All other components within normal limits  PROTIME-INR - Abnormal; Notable for the following:    Prothrombin Time 20.7 (*)    INR 1.74 (*)    All other components within normal limits  DIFFERENTIAL  URINALYSIS, ROUTINE W REFLEX MICROSCOPIC  CARDIAC PANEL(CRET KIN+CKTOT+MB+TROPI)  CARDIAC PANEL(CRET KIN+CKTOT+MB+TROPI)  CARDIAC PANEL(CRET KIN+CKTOT+MB+TROPI)  OCCULT BLOOD, POC DEVICE  OCCULT BLOOD X 1 CARD TO LAB, STOOL  CARDIAC PANEL(CRET KIN+CKTOT+MB+TROPI)  TSH  HEMOGLOBIN A1C  CBC  BASIC METABOLIC PANEL  CARDIAC PANEL(CRET KIN+CKTOT+MB+TROPI)  CARDIAC PANEL(CRET KIN+CKTOT+MB+TROPI)   Dg Chest Port 1 View  06/13/2011  *RADIOLOGY REPORT*  Clinical Data: Generalized weakness.  Ex-smoker.   PORTABLE CHEST - 1 VIEW  Comparison: 04/12/2011  Findings: Hyperinflation.  Dual lead pacer with leads right atrium, right ventricle.  Remote left rib trauma.  Patient rotated right. Normal heart size and mediastinal contours for age.  No right-sided pleural effusion. Left-sided pleural thickening and biapical pleural parenchymal scarring are felt to be similar back to 2007. Diffuse peribronchial thickening.  Mild volume loss and pulmonary opacity in the right infrahilar region and lung base, slightly increased.  IMPRESSION:  1.  COPD with similar scarring. 2.  Right infrahilar/lower lobe opacity is slightly increased. Consider short-term radiographic follow-up, to exclude developing pneumonia in this area.  Original Report Authenticated By: Consuello Bossier, M.D.     1. Hypotension   2. Weakness       MDM     Date: 06/13/2011  Rate: 75  Rhythm: normal sinus rhythm  QRS Axis: normal  Intervals: normal  ST/T Wave abnormalities: normal  Conduction Disutrbances:first-degree A-V block   Narrative Interpretation:   Old EKG Reviewed: unchanged from Apr 12, 2011   Dr. Hyman Hopes consulted with [facility] who has agreed to come and Dispo patient in ED.       Dorthula Matas, PA 06/14/11 1447

## 2011-06-13 NOTE — ED Notes (Signed)
Pt claimed that he had a sudden onset of chest discomfort that went away but now c/o lower jaw pain at 8/10. Pt is NSR on the monitor. Will let MD know

## 2011-06-13 NOTE — ED Notes (Signed)
Called report to [phone]

## 2011-06-13 NOTE — ED Notes (Signed)
Pt presented to the ER with c/o generalized weakness with pain all over x 2 months. Pt claimed that he also gets SOB easily

## 2011-06-13 NOTE — ED Provider Notes (Addendum)
BP 97/56  Pulse 70  Temp(Src) 97.5 F (36.4 C) (Oral)  Resp 18  Ht 5\' 11"  (1.803 m)  Wt 170 lb (77.111 kg)  BMI 23.71 kg/m2  SpO2 99%   Patient with gradually progressive chronic complaints including generalized fatigue/weakness, body aches, lightheadedness, intermittent sharp twinges of anterior chest pain since January- w/u previously unremarkable. Min cough. Denies fever/chills. No shortness of breath. Was seen by PMD yesterday for same. States that "I need to know what's going on, nobody can tell me". Patient noted to be borderline/hypotensive in the Emergency Department. Min response to 1L IVF. Hgb 12.7 but decreased from baseline. Possible pneumonia by CXR but clinical picture not consistent with pneumonia. Orthostatics negative. Covered with IV levaquin here in ED.  Low suspicion sepsis/cardiogenic shock. No obvious source of bleeding. Rectal exam with brown stool, heme negative. Possible hypotension from new med (imdur- started yesterday).  INR pending (h/o PE). PA discussed admit with [facility] hospitalist who will evaluate patient for possible observation admission.    Forbes Cellar, MD 06/13/11 7829  Forbes Cellar, MD 06/13/11 1745

## 2011-06-13 NOTE — Progress Notes (Signed)
ANTICOAGULATION CONSULT NOTE - Initial Consult  Pharmacy Consult for coumadin Indication: pulmonary embolus  Allergies  Allergen Reactions  . Iohexol      Desc: PT STATED TODAY HE GETS SLIGHT SOB FROM IV CONTRAST. HE NEEDS FULL PREMEDS FROM NOW ON PER DR. MATTERN   . Penicillins     Patient Measurements: Height: 5\' 11"  (180.3 cm) Weight: 170 lb (77.111 kg) IBW/kg (Calculated) : 75.3    Vital Signs: Temp: 97.5 F (36.4 C) (03/23 1632) Temp src: Oral (03/23 1632) BP: 105/42 mmHg (03/23 1947) Pulse Rate: 72  (03/23 1947)  Labs:  Basename 06/13/11 1743 06/13/11 1739 06/13/11 1139  HGB -- -- 12.6*  HCT -- -- 36.6*  PLT -- -- 229  APTT -- -- --  LABPROT -- 20.7* --  INR -- 1.74* --  HEPARINUNFRC -- -- --  CREATININE -- -- 0.95  CKTOTAL 67 -- --  CKMB 2.4 -- --  TROPONINI <0.30 -- --   Estimated Creatinine Clearance: 70.5 ml/min (by C-G formula based on Cr of 0.95).  Medical History: Past Medical History  Diagnosis Date  . Tachycardia, unspecified   . Hyperlipidemia   . Hypertension   . Chest pain, unspecified   . Other pulmonary embolism and infarction   . Dizziness and giddiness   . Esophageal reflux   . Shortness of breath   . Arthritis   . Personal history of kidney stones   . Diverticulosis     Medications:  Prescriptions prior to admission  Medication Sig Dispense Refill  . albuterol (PROVENTIL,VENTOLIN) 90 MCG/ACT inhaler Inhale 2 puffs into the lungs every 6 (six) hours as needed. For wheezing or shortness of breath      . aspirin 81 MG tablet Take 81 mg by mouth daily.        Marland Kitchen losartan (COZAAR) 50 MG tablet Take 50 mg by mouth daily.      Marland Kitchen lovastatin (MEVACOR) 40 MG tablet Take 40 mg by mouth at bedtime.       . metoprolol (LOPRESSOR) 50 MG tablet Take 1 tablet (50 mg total) by mouth daily.  30 tablet  3  . nitroGLYCERIN (NITROSTAT) 0.4 MG SL tablet Place 0.4 mg under the tongue every 5 (five) minutes as needed. For chest pain      . warfarin  (COUMADIN) 5 MG tablet Take 2.5-5 mg by mouth daily. Mon, Tues, Weds-1 tab (5 mg); All other days 0.5 half tab (2.5 mg)        Assessment: 78 yo man to continue coumadin for h/o PE.  His admit INR is subtherapeutic at 1.74 and he has taken his dose for today.  His home dose is listed as 5 mg on Mon,Tues and Wed and 2.5 mg other days. Goal of Therapy:  INR 2-3   Plan:  Will give an additional 2.5 mg dose tonight. F/u daily protime/INR.  Talbert Cage Poteet 06/13/2011,9:44 PM

## 2011-06-13 NOTE — ED Notes (Signed)
Pt received to [facility] 21 with c/o generalized weakness getting progressively worse with SOB. Pt A/A/Ox4, skin is warm and dry, respiration is even and unlabored. NAD

## 2011-06-13 NOTE — ED Notes (Signed)
Hospitalist was in to see the patient.

## 2011-06-14 ENCOUNTER — Observation Stay (HOSPITAL_COMMUNITY): Payer: Medicare Other

## 2011-06-14 LAB — CBC
HCT: 36.2 % — ABNORMAL LOW (ref 39.0–52.0)
Hemoglobin: 12.4 g/dL — ABNORMAL LOW (ref 13.0–17.0)
MCHC: 34.3 g/dL (ref 30.0–36.0)
WBC: 6.5 10*3/uL (ref 4.0–10.5)

## 2011-06-14 LAB — PROTIME-INR
INR: 1.91 — ABNORMAL HIGH (ref 0.00–1.49)
Prothrombin Time: 22.2 seconds — ABNORMAL HIGH (ref 11.6–15.2)

## 2011-06-14 LAB — BASIC METABOLIC PANEL
BUN: 11 mg/dL (ref 6–23)
CO2: 26 mEq/L (ref 19–32)
Chloride: 110 mEq/L (ref 96–112)
Glucose, Bld: 86 mg/dL (ref 70–99)
Potassium: 3.9 mEq/L (ref 3.5–5.1)

## 2011-06-14 LAB — CARDIAC PANEL(CRET KIN+CKTOT+MB+TROPI)
CK, MB: 2.7 ng/mL (ref 0.3–4.0)
Relative Index: INVALID (ref 0.0–2.5)
Total CK: 80 U/L (ref 7–232)
Troponin I: <0.3 ng/mL (ref <0.30)
Troponin I: <0.3 ng/mL (ref <0.30)

## 2011-06-14 LAB — HEMOGLOBIN A1C: Mean Plasma Glucose: 97 mg/dL (ref <117)

## 2011-06-14 MED ORDER — ALBUTEROL SULFATE (5 MG/ML) 0.5% IN NEBU: 2.5000 mg | INHALATION_SOLUTION | RESPIRATORY_TRACT | Status: DC | PRN

## 2011-06-14 MED ORDER — ONDANSETRON HCL 4 MG/2ML IJ SOLN: 4.0000 mg | Freq: Three times a day (TID) | INTRAMUSCULAR | Status: DC | PRN

## 2011-06-14 NOTE — Discharge Summary (Signed)
Physician Discharge Summary  Patient ID: [name] MRN: [id number] DOB/AGE: 1932/05/26 78 y.o.  Admit date: 06/13/2011 Discharge date: 06/14/2011  Primary Care Physician:  [name], MD, MD   Discharge Diagnoses:    Principal Problem:  *Hypotension:  Resolved    Medication List  As of 06/14/2011 11:41 AM   TAKE these medications         albuterol 90 MCG/ACT inhaler   Commonly known as: PROVENTIL,VENTOLIN   Inhale 2 puffs into the lungs every 6 (six) hours as needed. For wheezing or shortness of breath      aspirin 81 MG tablet   Take 81 mg by mouth daily.      losartan 50 MG tablet   Commonly known as: COZAAR   Take 50 mg by mouth daily.      lovastatin 40 MG tablet   Commonly known as: MEVACOR   Take 40 mg by mouth at bedtime.      metoprolol 50 MG tablet   Commonly known as: LOPRESSOR   Take 1 tablet (50 mg total) by mouth daily.      nitroGLYCERIN 0.4 MG SL tablet   Commonly known as: NITROSTAT   Place 0.4 mg under the tongue every 5 (five) minutes as needed. For chest pain      warfarin 5 MG tablet   Commonly known as: COUMADIN   Take 2.5-5 mg by mouth daily. Mon, Tues, Weds-1 tab (5 mg); All other days 0.5 half tab (2.5 mg)             Disposition and Follow-up:  Please follow up with your primary care physician or cardiologist in 1-2 weeks or sooner should any other concerns arise.   Significant Diagnostic Studies:  X-ray Chest Pa And Lateral   06/14/2011  *RADIOLOGY REPORT*  Clinical Data: Cough, shortness of breath  CHEST - 2 VIEW  Comparison: 06/13/2011; 04/12/2011; 03/25/2011  Findings: Grossly unchanged cardiac silhouette and mediastinal contours.  Stable positioning of support apparatus.  The lungs remain hyperinflated.  Improved aeration of the right lower lung. Persistent blunting of the bilateral costophrenic angles without definite pleural effusion.  Unchanged symmetric biapical pleural parenchymal thickening.  No pneumothorax.   Grossly unchanged bones.  IMPRESSION: 1.  Improved aeration of the right infrahilar lung suggests resolving atelectasis. 2.  Hyperinflated lungs without acute cardiopulmonary disease.  Original Report Authenticated By: Waynard Reeds, M.D.   Dg Chest Port 1 View  06/13/2011  *RADIOLOGY REPORT*  Clinical Data: Generalized weakness.  Ex-smoker.  PORTABLE CHEST - 1 VIEW  Comparison: 04/12/2011  Findings: Hyperinflation.  Dual lead pacer with leads right atrium, right ventricle.  Remote left rib trauma.  Patient rotated right. Normal heart size and mediastinal contours for age.  No right-sided pleural effusion. Left-sided pleural thickening and biapical pleural parenchymal scarring are felt to be similar back to 2007. Diffuse peribronchial thickening.  Mild volume loss and pulmonary opacity in the right infrahilar region and lung base, slightly increased.  IMPRESSION:  1.  COPD with similar scarring. 2.  Right infrahilar/lower lobe opacity is slightly increased. Consider short-term radiographic follow-up, to exclude developing pneumonia in this area.  Original Report Authenticated By: Consuello Bossier, M.D.    Brief H and P: For complete details please refer to admission H and P, but in brief patient is a 78 y/o CM presenting with primary complaint of weakness and persistent jaw discomfort to the ED.  Initial work up was negative which included cbc, BMP,  and urinalysis.  Patient was found to be Hypotensive which resolved with IVF hydration.  He reported that he was started on Imdur the day before for jaw pain by his PCP.   Initial chest x ray in the ED was interpreted as pneumonia vs atelectasis and they started patient on levaquin.  Patient did not have any symptoms, wbc count, or fevers. As such we repeated the chest x ray which came back as resolving atelectasis.    While I was examining him in the ED he complained of chest discomfort which was transient that was at his left chest.  Pain was gone while I  was in the room with him but he described it as sudden in onset ache and left spontaneously without any intervention.  He mentions that he was laying down while it happened.  During my examination he did not complain of any active pain but given his concominant complaint of jaw pain which is BL I decided to observe him overnight and cycle his cardiac enzymes.  Cardiac enzymes came back negative and hypotension resolved with cessation of the Imdur.  Patient has been complaining of increase in DOE.  Have recommended that he follow up with Cardiology this next week for further evaluation.  Considered ordering Echo but will defer to his cardiologist of which he has appointment this next week as indicated below.  I will recommend that the patient follow up with his primary care physician in 1 week or sooner should any other concerns arise.  Patient and family reports that he has appointment with his cardiologist this next week ~ on the 3/28.  Family aware and agreeable.  Disposition:  Family is concerned because they have received a lot of conflicting information.  They would like to know why patient has persistent jaw pain.  I explained that my concern was that it may have been referred pain from angina.  Nonetheless the pain is present all day everyday and as such I believe that patient will need further work up as outpatient.  Have suggested that patient discuss this further with primary care physician.  Consideration of other causes may be warranted and he may require ENT, Neurology, or Dental evaluation will defer to primary care physician.     Hospital Course:  Principal Problem:  *Hypotension: Resolved with cessation of imdur   Time spent on Discharge: 45 min  Signed: Penny Pia [facility] Pager: [phone] 06/14/2011, 11:41 AM

## 2011-06-16 ENCOUNTER — Other Ambulatory Visit (HOSPITAL_COMMUNITY): Payer: Self-pay | Admitting: Radiology

## 2011-06-16 NOTE — ED Provider Notes (Signed)
Medical screening examination/treatment/procedure(s) were conducted as a shared visit with non-physician practitioner(s) and myself.  I personally evaluated the patient during the encounter  See my additional note  Forbes Cellar, MD 06/16/11 1723

## 2011-06-17 ENCOUNTER — Other Ambulatory Visit: Payer: Self-pay

## 2011-06-17 ENCOUNTER — Ambulatory Visit (HOSPITAL_COMMUNITY): Payer: Medicare Other | Attending: Cardiology

## 2011-06-17 DIAGNOSIS — I519 Heart disease, unspecified: Secondary | ICD-10-CM | POA: Insufficient documentation

## 2011-06-17 DIAGNOSIS — R079 Chest pain, unspecified: Secondary | ICD-10-CM | POA: Insufficient documentation

## 2011-06-17 DIAGNOSIS — R0602 Shortness of breath: Secondary | ICD-10-CM

## 2011-06-17 DIAGNOSIS — R000 Tachycardia, unspecified: Secondary | ICD-10-CM | POA: Insufficient documentation

## 2011-06-17 DIAGNOSIS — R0609 Other forms of dyspnea: Secondary | ICD-10-CM | POA: Insufficient documentation

## 2011-06-17 DIAGNOSIS — I2510 Atherosclerotic heart disease of native coronary artery without angina pectoris: Secondary | ICD-10-CM | POA: Insufficient documentation

## 2011-06-17 DIAGNOSIS — J4489 Other specified chronic obstructive pulmonary disease: Secondary | ICD-10-CM | POA: Insufficient documentation

## 2011-06-17 DIAGNOSIS — E785 Hyperlipidemia, unspecified: Secondary | ICD-10-CM | POA: Insufficient documentation

## 2011-06-17 DIAGNOSIS — R0989 Other specified symptoms and signs involving the circulatory and respiratory systems: Secondary | ICD-10-CM | POA: Insufficient documentation

## 2011-06-17 DIAGNOSIS — J449 Chronic obstructive pulmonary disease, unspecified: Secondary | ICD-10-CM | POA: Insufficient documentation

## 2011-06-18 ENCOUNTER — Ambulatory Visit (INDEPENDENT_AMBULATORY_CARE_PROVIDER_SITE_OTHER): Payer: Medicare Other | Admitting: Internal Medicine

## 2011-06-18 ENCOUNTER — Encounter: Payer: Self-pay | Admitting: Internal Medicine

## 2011-06-18 VITALS — BP 112/61 | HR 62 | Resp 18 | Ht 71.0 in | Wt 169.4 lb

## 2011-06-18 DIAGNOSIS — I2510 Atherosclerotic heart disease of native coronary artery without angina pectoris: Secondary | ICD-10-CM

## 2011-06-18 DIAGNOSIS — I10 Essential (primary) hypertension: Secondary | ICD-10-CM

## 2011-06-18 DIAGNOSIS — I2699 Other pulmonary embolism without acute cor pulmonale: Secondary | ICD-10-CM

## 2011-06-18 DIAGNOSIS — R6884 Jaw pain: Secondary | ICD-10-CM

## 2011-06-18 DIAGNOSIS — I442 Atrioventricular block, complete: Secondary | ICD-10-CM

## 2011-06-18 DIAGNOSIS — Z950 Presence of cardiac pacemaker: Secondary | ICD-10-CM

## 2011-06-18 DIAGNOSIS — I441 Atrioventricular block, second degree: Secondary | ICD-10-CM

## 2011-06-18 LAB — PACEMAKER DEVICE OBSERVATION
AL THRESHOLD: 1.75 V
ATRIAL PACING PM: 7.4
BAMS-0003: 65 {beats}/min
DEVICE MODEL PM: 7167612
RV LEAD THRESHOLD: 1.75 V
VENTRICULAR PACING PM: 1

## 2011-06-18 MED ORDER — RIVAROXABAN 20 MG PO TABS: 20.0000 mg | ORAL_TABLET | Freq: Every day | ORAL | Status: DC

## 2011-06-18 NOTE — Patient Instructions (Addendum)
Your physician wants you to follow-up in: 12 months with Dr Jacquiline Doe will receive a reminder letter in the mail two months in advance. If you don't receive a letter, please call our office to schedule the follow-up appointment.  Remote monitoring is used to monitor your Pacemaker of ICD from home. This monitoring reduces the number of office visits required to check your device to one time per year. It allows [place] to keep an eye on the functioning of your device to ensure it is working properly. You are scheduled for a device check from home on 09/25/11. You may send your transmission at any time that day. If you have a wireless device, the transmission will be sent automatically. After your physician reviews your transmission, you will receive a postcard with your next transmission date.    Your physician has recommended you make the following change in your medication:  1) STOP Coumadin 2) STOP Aspirin 3) Start Xarelto 20mg  daily Your physician recommends that you return for lab work in: BMP/CBC in 4 weeks at primary care doctor

## 2011-06-23 ENCOUNTER — Encounter (HOSPITAL_COMMUNITY): Payer: Self-pay | Admitting: Family Medicine

## 2011-06-24 ENCOUNTER — Ambulatory Visit (INDEPENDENT_AMBULATORY_CARE_PROVIDER_SITE_OTHER): Payer: Medicare Other | Admitting: Internal Medicine

## 2011-06-24 ENCOUNTER — Encounter: Payer: Self-pay | Admitting: Internal Medicine

## 2011-06-24 VITALS — BP 128/70 | HR 91 | Temp 97.6°F | Ht 71.0 in | Wt 171.0 lb

## 2011-06-24 DIAGNOSIS — R0989 Other specified symptoms and signs involving the circulatory and respiratory systems: Secondary | ICD-10-CM

## 2011-06-24 DIAGNOSIS — R0602 Shortness of breath: Secondary | ICD-10-CM

## 2011-06-24 DIAGNOSIS — R0600 Dyspnea, unspecified: Secondary | ICD-10-CM

## 2011-06-24 LAB — PULMONARY FUNCTION TEST

## 2011-06-24 NOTE — Patient Instructions (Signed)
You do not significant copd and you never will unless you resume smoking  Only use your albuterol as a rescue medication to be used if you can't catch your breath by resting or doing a relaxed purse lip breathing pattern. The less you use it, the better it will work when you need it.   Try prilosec 20mg   Take 30-60 min before first meal of the day and Pepcid 20 mg one bedtime for at least a month on a trial basis.

## 2011-06-24 NOTE — Progress Notes (Signed)
  Subjective:    Patient ID: [name], male    DOB: 1933-03-14 MRN: [id number]   Brief patient profile:  29 yowm quit smoking around 2006 with variable sob ever since then  and no significant airflow obst by pft's in 2009 nor 06/24/2011    05/22/2011 1st pulmonary ov cc sob at rest and walks to mb and back sometimes does well, sometimes not much worse x 2 weeks not better on pred, some better with albuterol, assoc with sense of pnds but no purulent sputum.  Feels more choked than tight in chest. No assoc nausea or ex cp/ diaphoresis.  On lopressor doesn't feel saba helps so started on spiriva > no better.  Cardiac w/u 03/05/11 with nuclear stress test neg for ishemia with bnp < 100 04/12/11  rec Stop spiriva and lopressor Bystolic 10 mg one daily  Ok to use proaire if you can't catch your breath Work on inhaler technique   Try prilosec 20mg   Take 30-60 min before first meal of the day and Pepcid 20 mg one bedtime GERD diet    06/24/2011 f/u ov/[name] cc doe about the same maybe a little better, no cough.  He  did not follow instructions re gerd rx, rare need for albuterol.  Sleeping ok without nocturnal  or early am exacerbation  of respiratory  c/o's or need for noct saba. Also denies any obvious fluctuation of symptoms with weather or environmental changes or other aggravating or alleviating factors except as outlined above         Objective:   Physical Exam  Anxious amb wm with classic pseudowheeze Wt  172 05/22/2011 > 06/24/2011  171  HEENT mild turbinate edema.  Oropharynx no thrush or excess pnd or cobblestoning.  No JVD or cervical adenopathy. Mild accessory muscle hypertrophy. Trachea midline, nl thryroid. Chest was min hyperinflated by percussion with diminished breath sounds and min  increased exp time without wheeze. Hoover sign positive at very end of  inspiration. Regular rate and rhythm without murmur gallop or rub or increase P2 or edema.  Abd: no hsm, nl excursion. Ext warm  without cyanosis or clubbing.    04/12/11 New minimal right lower lobe atelectasis. Otherwise no change     Assessment & Plan:

## 2011-06-24 NOTE — Progress Notes (Signed)
PFT done today. 

## 2011-06-24 NOTE — Assessment & Plan Note (Signed)
-    05/22/2011  Walked RA x 3 laps @ 185 ft each stopped due to  End of study, no desat    - Changed lopressor to bystolic 05/23/2011 > no change    - PFT's 06/24/2011 FEV1  2.31 (82%) ratio 62 and DLCO 87%  DDX of  difficult airways managment all start with A and  include Adherence, Ace Inhibitors, Acid Reflux, Active Sinus Disease, Alpha 1 Antitripsin deficiency, Anxiety masquerading as Airways dz,  ABPA,  allergy(esp in young), Aspiration (esp in elderly), Adverse effects of DPI,  Active smokers, plus two Bs  = Bronchiectasis and Beta blocker use..and one C= CHF  Acid reflux  Remains the top ddx consideration here and he has not yet complied with recs for a minimum of a one month trial before considering additional w/u which might include cpst with spirometry before and after

## 2011-06-28 ENCOUNTER — Encounter: Payer: Self-pay | Admitting: Internal Medicine

## 2011-06-28 NOTE — Assessment & Plan Note (Signed)
Normal pacemaker function See Pace Art report No changes today  

## 2011-06-28 NOTE — Assessment & Plan Note (Signed)
Work up is ongoing He is scheduled to see ENT. Recent stress testing was low risk. Will defer further workup until after evaluated by ENT

## 2011-06-28 NOTE — Progress Notes (Signed)
PCP:  [name], MD, MD  The patient presents today for routine cardiology followup.  He was recently hospitalized with atypical chest and jaw pain.  He was dehydrated and hypotensive at that time as well.  His jaw pain has been persistent for months and has been evaluated by stress testing which was low risk.  This is felt to not be cardiac in nature previously.  He was placed on imdur without relief.  He had symptomatic hypotension with imdur and therefore no longer takes this medicine.  Today, he denies symptoms of palpitations, chest pain, shortness of breath, orthopnea, PND, lower extremity edema, dizziness, presyncope, syncope, or neurologic sequela.  His jaw pain is stable.  He has an ENT visit planned. The patient feels that he is tolerating medications without difficulties and is otherwise without complaint today.   Past Medical History  Diagnosis Date  . Tachycardia, unspecified   . Hyperlipidemia   . Hypertension   . Atypical chest pain   . Other pulmonary embolism and infarction   . Dizziness and giddiness   . Esophageal reflux   . Shortness of breath   . Arthritis   . Personal history of kidney stones   . Diverticulosis   . CAD (coronary artery disease)     nonobstructive   Past Surgical History  Procedure Date  . Pacemaker insertion   . Cystoscopy   . Cholecystectomy   . Appendectomy   . Lung surgery age 34    Current Outpatient Prescriptions  Medication Sig Dispense Refill  . albuterol (PROVENTIL,VENTOLIN) 90 MCG/ACT inhaler Inhale 2 puffs into the lungs every 6 (six) hours as needed. For wheezing or shortness of breath      . losartan (COZAAR) 50 MG tablet Take 50 mg by mouth daily.      . nitroGLYCERIN (NITROSTAT) 0.4 MG SL tablet Place 0.4 mg under the tongue every 5 (five) minutes as needed. For chest pain      . metoprolol (LOPRESSOR) 50 MG tablet Take 1 tablet (50 mg total) by mouth daily.  30 tablet  3  . Rivaroxaban (XARELTO) 20 MG TABS Take 20 mg  by mouth daily.  30 tablet  6  . DISCONTD: albuterol (PROVENTIL,VENTOLIN) 90 MCG/ACT inhaler Inhale 2 puffs into the lungs every 6 (six) hours as needed for wheezing or shortness of breath.  17 g  3    Allergies  Allergen Reactions  . Iohexol      Desc: PT STATED TODAY HE GETS SLIGHT SOB FROM IV CONTRAST. HE NEEDS FULL PREMEDS FROM NOW ON PER DR. MATTERN   . Penicillins     History   Social History  . Marital Status: Married    Spouse Name: N/A    Number of Children: 1  . Years of Education: N/A   Occupational History  . Retired     [employer]al architect    Social History Main Topics  . Smoking status: Former Smoker -- 1.0 packs/day for 50 years    Types: Cigarettes    Quit date: 03/23/2004  . Smokeless tobacco: Current User    Types: Chew  . Alcohol Use: No  . Drug Use: No  . Sexually Active: Not on file   Other Topics Concern  . Not on file   Social History Narrative   2 caffeine drink daily     Family History  Problem Relation Age of Onset  . Hypertension Neg Hx   . Hyperlipidemia Neg Hx   . Heart failure  Neg Hx   . Heart disease Neg Hx   . Colon cancer Neg Hx   . Breast cancer Mother   . Emphysema Sister     was a smoker    Physical Exam: Filed Vitals:   06/18/11 1215  BP: 112/61  Pulse: 62  Resp: 18  Height: 5\' 11"  (1.803 m)  Weight: 169 lb 6.4 oz (76.839 kg)    GEN- The patient is well appearing, alert and oriented x 3 today.   Head- normocephalic, atraumatic Eyes-  Sclera clear, conjunctiva pink Ears- hearing intact Oropharynx- clear Neck- supple, no JVP Lymph- no cervical lymphadenopathy Lungs- Clear to ausculation bilaterally, normal work of breathing Chest- pacemaker pocket is well healed Heart- Regular rate and rhythm, no murmurs, rubs or gallops, PMI not laterally displaced GI- soft, NT, ND, + BS Extremities- no clubbing, cyanosis, or edema  Pacemaker interrogation- reviewed in detail today,  See PACEART report  Assessment and Plan:

## 2011-06-28 NOTE — Assessment & Plan Note (Signed)
Stable with recent low risk myoview

## 2011-06-28 NOTE — Assessment & Plan Note (Signed)
Recent hypotension is improved No changes today

## 2011-06-28 NOTE — Assessment & Plan Note (Signed)
He wishes to stop coumadin and start xarelto. Stop coumadin and ASA today Start xarelto 20mg  daily  Check BMET and CBC today.  He is aware that these will need to be repeated by his primary care physician in 4 weeks and then every 6 months thereafter.

## 2011-07-02 ENCOUNTER — Encounter: Payer: Self-pay | Admitting: Internal Medicine

## 2011-07-20 ENCOUNTER — Encounter: Payer: Self-pay | Admitting: Physician Assistant

## 2011-07-21 ENCOUNTER — Inpatient Hospital Stay (HOSPITAL_COMMUNITY)
Admission: AD | Admit: 2011-07-21 | Discharge: 2011-07-23 | DRG: 287 | Disposition: A | Discharge status: Home / Self Care | Payer: Medicare Other | Source: Ambulatory Visit | Attending: Cardiology | Admitting: Cardiology

## 2011-07-21 ENCOUNTER — Encounter: Payer: Self-pay | Admitting: Physician Assistant

## 2011-07-21 ENCOUNTER — Ambulatory Visit (INDEPENDENT_AMBULATORY_CARE_PROVIDER_SITE_OTHER): Payer: Medicare Other | Admitting: Physician Assistant

## 2011-07-21 ENCOUNTER — Encounter (HOSPITAL_COMMUNITY): Payer: Self-pay | Admitting: General Practice

## 2011-07-21 VITALS — BP 132/66 | HR 62 | Ht 71.0 in | Wt 164.0 lb

## 2011-07-21 DIAGNOSIS — Z8546 Personal history of malignant neoplasm of prostate: Secondary | ICD-10-CM

## 2011-07-21 DIAGNOSIS — I2699 Other pulmonary embolism without acute cor pulmonale: Secondary | ICD-10-CM

## 2011-07-21 DIAGNOSIS — M129 Arthropathy, unspecified: Secondary | ICD-10-CM | POA: Diagnosis present

## 2011-07-21 DIAGNOSIS — I10 Essential (primary) hypertension: Secondary | ICD-10-CM

## 2011-07-21 DIAGNOSIS — J449 Chronic obstructive pulmonary disease, unspecified: Secondary | ICD-10-CM | POA: Diagnosis present

## 2011-07-21 DIAGNOSIS — K219 Gastro-esophageal reflux disease without esophagitis: Secondary | ICD-10-CM

## 2011-07-21 DIAGNOSIS — R079 Chest pain, unspecified: Principal | ICD-10-CM | POA: Diagnosis present

## 2011-07-21 DIAGNOSIS — I2782 Chronic pulmonary embolism: Secondary | ICD-10-CM | POA: Diagnosis present

## 2011-07-21 DIAGNOSIS — R52 Pain, unspecified: Secondary | ICD-10-CM | POA: Diagnosis present

## 2011-07-21 DIAGNOSIS — I720 Aneurysm of carotid artery: Secondary | ICD-10-CM | POA: Diagnosis present

## 2011-07-21 DIAGNOSIS — I2510 Atherosclerotic heart disease of native coronary artery without angina pectoris: Secondary | ICD-10-CM

## 2011-07-21 DIAGNOSIS — J4489 Other specified chronic obstructive pulmonary disease: Secondary | ICD-10-CM | POA: Diagnosis present

## 2011-07-21 HISTORY — DX: Aneurysm of carotid artery: I72.0

## 2011-07-21 HISTORY — DX: Malignant neoplasm of prostate: C61

## 2011-07-21 HISTORY — DX: Unspecified atrioventricular block: I44.30

## 2011-07-21 LAB — COMPREHENSIVE METABOLIC PANEL
ALT: 16 U/L (ref 0–53)
Alkaline Phosphatase: 98 U/L (ref 39–117)
BUN: 12 mg/dL (ref 6–23)
CO2: 27 mEq/L (ref 19–32)
GFR calc Af Amer: >90 mL/min (ref >90)
GFR calc non Af Amer: 78 mL/min — ABNORMAL LOW (ref >90)
Glucose, Bld: 83 mg/dL (ref 70–99)
Potassium: 4.5 mEq/L (ref 3.5–5.1)
Sodium: 139 mEq/L (ref 135–145)
Total Bilirubin: 0.9 mg/dL (ref 0.3–1.2)

## 2011-07-21 LAB — CBC
Hemoglobin: 15.7 g/dL (ref 13.0–17.0)
MCH: 31.6 pg (ref 26.0–34.0)
MCV: 89.9 fL (ref 78.0–100.0)
Platelets: 278 10*3/uL (ref 150–400)
RBC: 4.97 MIL/uL (ref 4.22–5.81)
WBC: 7.5 10*3/uL (ref 4.0–10.5)

## 2011-07-21 LAB — CARDIAC PANEL(CRET KIN+CKTOT+MB+TROPI)
CK, MB: 2.5 ng/mL (ref 0.3–4.0)
Relative Index: INVALID (ref 0.0–2.5)
Relative Index: INVALID (ref 0.0–2.5)
Troponin I: <0.3 ng/mL (ref <0.30)

## 2011-07-21 LAB — DIFFERENTIAL
Eosinophils Absolute: 0.4 10*3/uL (ref 0.0–0.7)
Lymphocytes Relative: 28 % (ref 12–46)
Lymphs Abs: 2.1 10*3/uL (ref 0.7–4.0)
Monocytes Relative: 8 % (ref 3–12)
Neutrophils Relative %: 58 % (ref 43–77)

## 2011-07-21 MED ORDER — ASPIRIN EC 81 MG PO TBEC
81.0000 mg | DELAYED_RELEASE_TABLET | Freq: Every day | ORAL | Status: DC
  Administered 2011-07-23: 81 mg via ORAL
  Filled 2011-07-21: qty 1

## 2011-07-21 MED ORDER — NITROGLYCERIN 0.4 MG SL SUBL: 0.4000 mg | SUBLINGUAL_TABLET | SUBLINGUAL | Status: DC | PRN

## 2011-07-21 MED ORDER — DIPHENHYDRAMINE HCL 50 MG/ML IJ SOLN
25.0000 mg | INTRAMUSCULAR | Status: AC
  Administered 2011-07-22: 25 mg via INTRAVENOUS
  Filled 2011-07-21: qty 1

## 2011-07-21 MED ORDER — HEPARIN (PORCINE) IN NACL 100-0.45 UNIT/ML-% IJ SOLN
750.0000 [IU]/h | INTRAMUSCULAR | Status: DC
  Administered 2011-07-21: 900 [IU]/h via INTRAVENOUS
  Administered 2011-07-22: 750 [IU]/h via INTRAVENOUS
  Filled 2011-07-21: qty 250

## 2011-07-21 MED ORDER — DULOXETINE HCL 30 MG PO CPEP
30.0000 mg | ORAL_CAPSULE | Freq: Every day | ORAL | Status: DC
  Administered 2011-07-21 – 2011-07-23 (×3): 30 mg via ORAL
  Filled 2011-07-21 (×3): qty 1

## 2011-07-21 MED ORDER — ALBUTEROL SULFATE HFA 108 (90 BASE) MCG/ACT IN AERS
2.0000 | INHALATION_SPRAY | Freq: Four times a day (QID) | RESPIRATORY_TRACT | Status: DC | PRN
  Filled 2011-07-21: qty 6.7

## 2011-07-21 MED ORDER — DIPHENHYDRAMINE HCL 50 MG/ML IJ SOLN: 25.0000 mg | INTRAMUSCULAR | Status: DC

## 2011-07-21 MED ORDER — ASPIRIN 81 MG PO CHEW
324.0000 mg | CHEWABLE_TABLET | ORAL | Status: AC
  Administered 2011-07-22: 324 mg via ORAL
  Filled 2011-07-21: qty 4

## 2011-07-21 MED ORDER — PANTOPRAZOLE SODIUM 40 MG PO TBEC
40.0000 mg | DELAYED_RELEASE_TABLET | Freq: Every day | ORAL | Status: DC
  Filled 2011-07-21: qty 1

## 2011-07-21 MED ORDER — SODIUM CHLORIDE 0.9 % IV SOLN: 250.0000 mL | INTRAVENOUS | Status: DC | PRN

## 2011-07-21 MED ORDER — CLONAZEPAM 0.5 MG PO TABS: 0.5000 mg | ORAL_TABLET | Freq: Two times a day (BID) | ORAL | Status: DC | PRN

## 2011-07-21 MED ORDER — SODIUM CHLORIDE 0.9 % IJ SOLN
3.0000 mL | Freq: Two times a day (BID) | INTRAMUSCULAR | Status: DC
  Administered 2011-07-21: 3 mL via INTRAVENOUS

## 2011-07-21 MED ORDER — ACETAMINOPHEN 325 MG PO TABS
650.0000 mg | ORAL_TABLET | ORAL | Status: DC | PRN
  Administered 2011-07-21 – 2011-07-23 (×2): 650 mg via ORAL
  Filled 2011-07-21 (×2): qty 2

## 2011-07-21 MED ORDER — ONDANSETRON HCL 4 MG/2ML IJ SOLN: 4.0000 mg | Freq: Four times a day (QID) | INTRAMUSCULAR | Status: DC | PRN

## 2011-07-21 MED ORDER — ASPIRIN EC 81 MG PO TBEC: 81.0000 mg | DELAYED_RELEASE_TABLET | Freq: Every day | ORAL | Status: DC

## 2011-07-21 MED ORDER — MORPHINE SULFATE 2 MG/ML IJ SOLN: 1.0000 mg | INTRAMUSCULAR | Status: DC | PRN

## 2011-07-21 MED ORDER — SODIUM CHLORIDE 0.9 % IJ SOLN
3.0000 mL | Freq: Two times a day (BID) | INTRAMUSCULAR | Status: DC
  Administered 2011-07-21 – 2011-07-23 (×4): 3 mL via INTRAVENOUS

## 2011-07-21 MED ORDER — PREDNISONE 50 MG PO TABS
60.0000 mg | ORAL_TABLET | ORAL | Status: AC
  Administered 2011-07-22: 60 mg via ORAL
  Filled 2011-07-21: qty 1

## 2011-07-21 MED ORDER — ASPIRIN 81 MG PO CHEW
324.0000 mg | CHEWABLE_TABLET | ORAL | Status: AC
  Administered 2011-07-21: 324 mg via ORAL
  Filled 2011-07-21: qty 4

## 2011-07-21 MED ORDER — SODIUM CHLORIDE 0.9 % IV SOLN
INTRAVENOUS | Status: DC
  Administered 2011-07-22: 04:00:00 via INTRAVENOUS

## 2011-07-21 MED ORDER — FAMOTIDINE IN NACL 20-0.9 MG/50ML-% IV SOLN
20.0000 mg | INTRAVENOUS | Status: DC
  Filled 2011-07-21: qty 50

## 2011-07-21 MED ORDER — NITROGLYCERIN 0.4 MG SL SUBL
0.4000 mg | SUBLINGUAL_TABLET | SUBLINGUAL | Status: DC | PRN
  Administered 2011-07-21 – 2011-07-22 (×2): 0.4 mg via SUBLINGUAL

## 2011-07-21 MED ORDER — SODIUM CHLORIDE 0.9 % IJ SOLN: 3.0000 mL | INTRAMUSCULAR | Status: DC | PRN

## 2011-07-21 MED ORDER — FAMOTIDINE IN NACL 20-0.9 MG/50ML-% IV SOLN
20.0000 mg | INTRAVENOUS | Status: AC
  Administered 2011-07-22: 20 mg via INTRAVENOUS
  Filled 2011-07-21: qty 50

## 2011-07-21 MED ORDER — PREDNISONE 50 MG PO TABS
60.0000 mg | ORAL_TABLET | ORAL | Status: AC
  Administered 2011-07-21: 60 mg via ORAL
  Filled 2011-07-21: qty 1

## 2011-07-21 MED ORDER — HEPARIN BOLUS VIA INFUSION
4000.0000 [IU] | Freq: Once | INTRAVENOUS | Status: AC
  Administered 2011-07-21: 4000 [IU] via INTRAVENOUS
  Filled 2011-07-21: qty 4000

## 2011-07-21 MED ORDER — TRAZODONE HCL 50 MG PO TABS
50.0000 mg | ORAL_TABLET | Freq: Every evening | ORAL | Status: DC | PRN
  Administered 2011-07-23: 50 mg via ORAL
  Filled 2011-07-21 (×2): qty 1

## 2011-07-21 MED ORDER — ALBUTEROL 90 MCG/ACT IN AERS: 2.0000 | INHALATION_SPRAY | Freq: Four times a day (QID) | RESPIRATORY_TRACT | Status: DC | PRN

## 2011-07-21 MED ORDER — LOSARTAN POTASSIUM 50 MG PO TABS
50.0000 mg | ORAL_TABLET | Freq: Every day | ORAL | Status: DC
  Administered 2011-07-22 – 2011-07-23 (×2): 50 mg via ORAL
  Filled 2011-07-21 (×3): qty 1

## 2011-07-21 MED ORDER — ASPIRIN 300 MG RE SUPP
300.0000 mg | RECTAL | Status: AC
  Filled 2011-07-21: qty 1

## 2011-07-21 NOTE — H&P (Signed)
Admission History and Physical Date:  07/21/2011   Name:  [name]       DOB:  10-18-1932 MRN:  [id number]  PCP:  [name], MD, MD  Primary Cardiologist/Primary Electrophysiologist:  Dr. Hillis Range    History of Present Illness: [name] is a 79 y.o. male who returns for re-evaluation of chest and jaw pain.  He was previously followed by Dr. Juanda Chance. He established with Dr. Hillis Range 3/12. He has a history of nonobstructive CAD LHC 8/01: pLAD 25%, mid 40%/25%, EF 65%.  He also has a history of A-V block, status post pacemaker implantation 1991, hyperlipidemia, hypertension, pulmonary embolism, chronic Coumadin therapy, COPD, GERD, bilateral internal carotid artery aneurysms followed by Dr. Arbie Cookey, prostate cancer status post cryotherapy.   I saw him 02/2011 with chest and jaw pain.  Lexiscan Myoview 02/2011: EF 67%, no ischemia.  Echocardiogram 06/17/11: Mild focal basal septal hypertrophy, moderate LVH, EF 55-60%, mild AI.  He was admitted 3/23-3/24 with symptomatic hypotension in the setting of isosorbide which had recently been initiated.  He was hydrated with IV fluids.  The isosorbide was discontinued.  He continued to complain of chest discomfort and jaw discomfort.  Myocardial infarction was ruled out by serial enzymes.  He was seen in follow up with Dr. Hillis Range on 06/18/11.  He recommended no further cardiac workup.  He recommended continued evaluation with ENT and follow up in one year.  Of note, his Coumadin was switched to Xarelto.  He was seen by his PCP today who noted patient had continued symptoms and requested he be seen today.  He is added on to my schedule.  He continues to note chest, bilateral arm and bilateral jaw pain.  This can come on at any time.  He describes it as a heaviness.  It is sometimes a burning sensation.  Activity does not seem to make her worse.  It may last a few minutes to a few hours.  He does note associated shortness of breath,  nausea and occasional diaphoresis.  He denies syncope.  He denies orthopnea, PND or edema.  He denies improvement with nitroglycerin.  He is currently on Prilosec.  He denies association with meals.  He denies odynophagia or dysphagia.  He denies vomiting or diarrhea.  He saw Dr. Sherene Sires 06/24/11.  Changed Lopressor to Bystolic.  However, he only got samples and has not taken for a couple weeks.    Past Medical History  Diagnosis Date  . Tachycardia, unspecified   . Hyperlipidemia   . Hypertension   . Atypical chest pain   . Other pulmonary embolism and infarction   . Dizziness and giddiness   . Esophageal reflux   . Shortness of breath   . Arthritis   . Personal history of kidney stones   . Diverticulosis   . CAD (coronary artery disease)     nonobstructive    Current Outpatient Prescriptions  Medication Sig Dispense Refill  . albuterol (PROVENTIL,VENTOLIN) 90 MCG/ACT inhaler Inhale 2 puffs into the lungs every 6 (six) hours as needed. For wheezing or shortness of breath      . losartan (COZAAR) 50 MG tablet Take 50 mg by mouth daily.      . metoprolol (LOPRESSOR) 50 MG tablet Take 1 tablet (50 mg total) by mouth daily.  30 tablet  3  . nitroGLYCERIN (NITROSTAT) 0.4 MG SL tablet Place 0.4 mg under the tongue every 5 (five) minutes as needed. For chest pain      .  Rivaroxaban (XARELTO) 20 MG TABS Take 20 mg by mouth daily.  30 tablet  6  . DISCONTD: albuterol (PROVENTIL,VENTOLIN) 90 MCG/ACT inhaler Inhale 2 puffs into the lungs every 6 (six) hours as needed for wheezing or shortness of breath.  17 g  3    Allergies: Allergies  Allergen Reactions  . Iohexol      Desc: PT STATED TODAY HE GETS SLIGHT SOB FROM IV CONTRAST. HE NEEDS FULL PREMEDS FROM NOW ON PER DR. MATTERN   . Penicillins     History  Substance Use Topics  . Smoking status: Former Smoker -- 1.0 packs/day for 50 years    Types: Cigarettes    Quit date: 03/23/2004  . Smokeless tobacco: Current User    Types: Chew  .  Alcohol Use: No     Family History  Problem Relation Age of Onset  . Hypertension Neg Hx   . Hyperlipidemia Neg Hx   . Heart failure Neg Hx   . Heart disease Neg Hx   . Colon cancer Neg Hx   . Breast cancer Mother   . Emphysema Sister     was a smoker     ROS:  Please see the history of present illness.   Admits to recent URI symptoms.  Denies fevers or productive cough.  Denies melena, hematochezia, hematemesis.  All other systems reviewed and negative.   PHYSICAL EXAM: VS:  BP 132/66  Pulse 62  Ht 5\' 11"  (1.803 m)  Wt 164 lb (74.39 kg)  BMI 22.87 kg/m2 Well nourished, well developed, in no acute distress HEENT: normal Neck: no JVD Vascular: No carotid bruits Endocrine: No thyromegaly Cardiac:  normal S1, S2; RRR; no murmur Lungs:  clear to auscultation bilaterally, no wheezing, rhonchi or rales Abd: soft, nontender, no hepatomegaly Ext: no edema Skin: warm and dry Neuro:  CNs 2-12 intact, no focal abnormalities noted Psych: Normal affect  EKG:  Sinus rhythm, heart rate 62, normal axis, no change from prior tracing  ASSESSMENT AND PLAN:  1.  Chest pain He has had a complete workup in the recent past.  This has been unremarkable.  We discussed whether or not to proceed with cardiac catheterization to further define his anatomy with his ongoing symptoms.  He is having chest pain the office today.  The patient is agreeable to proceed with cardiac catheterization.  I discussed this with Dr. Shirlee Latch (DOD) who agreed.  We will admit the patient to the hospital today.  He is due to take xarelto again at 6 PM.  We will start heparin at that time.  Proceed with cardiac catheterization tomorrow.  Cycle cardiac enzymes tonight.  If his cardiac catheterization does not demonstrate significant obstructive CAD, he will need referral to gastroenterology.  Risks and benefits of cardiac catheterization have been discussed with the patient.  These include bleeding, infection, kidney damage,  stroke, heart attack, death.  The patient understands these risks and is willing to proceed.  Of note, he has an IV dye allergy.  He will be pre-treated.    2.  Nonobstructive CAD by cardiac catheterization in 2001 Proceed with LHC as noted.  3.  GERD Continue PPI.  Consider GI referral if cath ok.  4.  COPD Followed by Dr. Sherene Sires.  5.  Remote history of pulmonary embolism Now on Xarelto.  Will cover with IV Heparin while off Xarelto.  Restart after cath.  6.  Hypertension Controlled.  Continue current therapy.   7.  Bilateral internal carotid  artery aneurysms Followed by Dr. Arbie Cookey.   Signed, Tereso Newcomer, PA-C  2:03 PM 07/21/2011

## 2011-07-21 NOTE — Progress Notes (Signed)
[address] [place], [place]  [address] Phone: [phone] Fax:  [phone]  Date:  07/21/2011   Name:  [name]       DOB:  September 24, 1932 MRN:  [id number]  PCP:  [name], MD, MD  Primary Cardiologist/Primary Electrophysiologist:  Dr. Hillis Range    History of Present Illness: [name] is a 78 y.o. male who returns for re-evaluation of chest and jaw pain.  He was previously followed by Dr. Juanda Chance. He established with Dr. Hillis Range 3/12. He has a history of nonobstructive CAD LHC 8/01: pLAD 25%, mid 40%/25%, EF 65%.  He also has a history of A-V block, status post pacemaker implantation 1991, hyperlipidemia, hypertension, pulmonary embolism, chronic Coumadin therapy, COPD, GERD, bilateral internal carotid artery aneurysms followed by Dr. Arbie Cookey, prostate cancer status post cryotherapy.   I saw him 02/2011 with chest and jaw pain.  Lexiscan Myoview 02/2011: EF 67%, no ischemia.  Echocardiogram 06/17/11: Mild focal basal septal hypertrophy, moderate LVH, EF 55-60%, mild AI.  He was admitted 3/23-3/24 with symptomatic hypotension in the setting of isosorbide which had recently been initiated.  He was hydrated with IV fluids.  The isosorbide was discontinued.  He continued to complain of chest discomfort and jaw discomfort.  Myocardial infarction was ruled out by serial enzymes.  He was seen in follow up with Dr. Hillis Range on 06/18/11.  He recommended no further cardiac workup.  He recommended continued evaluation with ENT and follow up in one year.  Of note, his Coumadin was switched to Xarelto.  He was seen by his PCP today who noted patient had continued symptoms and requested he be seen today.  He is added on to my schedule.  He continues to note chest, bilateral arm and bilateral jaw pain.  This can come on at any time.  He describes it as a heaviness.  It is sometimes a burning sensation.  Activity does not seem to make her worse.  It may  last a few minutes to a few hours.  He does note associated shortness of breath, nausea and occasional diaphoresis.  He denies syncope.  He denies orthopnea, PND or edema.  He denies improvement with nitroglycerin.  He is currently on Prilosec.  He denies association with meals.  He denies odynophagia or dysphagia.  He denies vomiting or diarrhea.  He saw Dr. Sherene Sires 06/24/11.  Changed Lopressor to Bystolic.  However, he only got samples and has not taken for a couple weeks.    Past Medical History  Diagnosis Date  . Tachycardia, unspecified   . Hyperlipidemia   . Hypertension   . Atypical chest pain   . Other pulmonary embolism and infarction   . Dizziness and giddiness   . Esophageal reflux   . Shortness of breath   . Arthritis   . Personal history of kidney stones   . Diverticulosis   . CAD (coronary artery disease)     nonobstructive    Current Outpatient Prescriptions  Medication Sig Dispense Refill  . albuterol (PROVENTIL,VENTOLIN) 90 MCG/ACT inhaler Inhale 2 puffs into the lungs every 6 (six) hours as needed. For wheezing or shortness of breath      . losartan (COZAAR) 50 MG tablet Take 50 mg by mouth daily.      . metoprolol (LOPRESSOR) 50 MG tablet Take 1 tablet (50 mg total) by mouth daily.  30 tablet  3  . nitroGLYCERIN (NITROSTAT) 0.4 MG SL tablet Place 0.4  mg under the tongue every 5 (five) minutes as needed. For chest pain      . Rivaroxaban (XARELTO) 20 MG TABS Take 20 mg by mouth daily.  30 tablet  6  . DISCONTD: albuterol (PROVENTIL,VENTOLIN) 90 MCG/ACT inhaler Inhale 2 puffs into the lungs every 6 (six) hours as needed for wheezing or shortness of breath.  17 g  3    Allergies: Allergies  Allergen Reactions  . Iohexol      Desc: PT STATED TODAY HE GETS SLIGHT SOB FROM IV CONTRAST. HE NEEDS FULL PREMEDS FROM NOW ON PER DR. MATTERN   . Penicillins     History  Substance Use Topics  . Smoking status: Former Smoker -- 1.0 packs/day for 50 years    Types: Cigarettes      Quit date: 03/23/2004  . Smokeless tobacco: Current User    Types: Chew  . Alcohol Use: No     Family History  Problem Relation Age of Onset  . Hypertension Neg Hx   . Hyperlipidemia Neg Hx   . Heart failure Neg Hx   . Heart disease Neg Hx   . Colon cancer Neg Hx   . Breast cancer Mother   . Emphysema Sister     was a smoker     ROS:  Please see the history of present illness.   Admits to recent URI symptoms.  Denies fevers or productive cough.  Denies melena, hematochezia, hematemesis.  All other systems reviewed and negative.   PHYSICAL EXAM: VS:  BP 132/66  Pulse 62  Ht 5\' 11"  (1.803 m)  Wt 164 lb (74.39 kg)  BMI 22.87 kg/m2 Well nourished, well developed, in no acute distress HEENT: normal Neck: no JVD Vascular: No carotid bruits Endocrine: No thyromegaly Cardiac:  normal S1, S2; RRR; no murmur Lungs:  clear to auscultation bilaterally, no wheezing, rhonchi or rales Abd: soft, nontender, no hepatomegaly Ext: no edema Skin: warm and dry Neuro:  CNs 2-12 intact, no focal abnormalities noted Psych: Normal affect  EKG:  Sinus rhythm, heart rate 62, normal axis, no change from prior tracing  ASSESSMENT AND PLAN:  1.  Chest pain He has had a complete workup in the recent past.  This has been unremarkable.  We discussed whether or not to proceed with cardiac catheterization to further define his anatomy with his ongoing symptoms.  He is having chest pain the office today.  The patient is agreeable to proceed with cardiac catheterization.  I discussed this with Dr. Shirlee Latch (DOD) who agreed.  We will admit the patient to the hospital today.  He is due to take xarelto again at 6 PM.  We will start heparin at that time.  Proceed with cardiac catheterization tomorrow.  Cycle cardiac enzymes tonight.  If his cardiac catheterization does not demonstrate significant obstructive CAD, he will need referral to gastroenterology.  Risks and benefits of cardiac catheterization have been  discussed with the patient.  These include bleeding, infection, kidney damage, stroke, heart attack, death.  The patient understands these risks and is willing to proceed.  Of note, he has an IV dye allergy.  He will be pre-treated.    2.  Nonobstructive CAD by cardiac catheterization in 2001 Proceed with LHC as noted.  3.  GERD Continue PPI.  Consider GI referral if cath ok.  4.  COPD Followed by Dr. Sherene Sires.  5.  Remote history of pulmonary embolism Now on Xarelto.  Will cover with IV Heparin while off Xarelto.  Restart after cath.  6.  Hypertension Controlled.  Continue current therapy.   7.  Bilateral internal carotid artery aneurysms Followed by Dr. Arbie Cookey.   Signed, Tereso Newcomer, PA-C  2:03 PM 07/21/2011

## 2011-07-21 NOTE — Progress Notes (Signed)
ANTICOAGULATION CONSULT NOTE - Initial Consult  Pharmacy Consult for Heparin Indication: hx pulmonary embolus  Assessment: 78 yo male with ongoing chest pain despite recent work-up admitted for cath tomorrow. Patient takes Xarelto at home for hx PE and last dose was 4/29 at 18:00.  Goal of Therapy:  Heparin level 0.3-0.7 units/ml   Plan:  1. Heparin 4000 units IV bolus then 900 units/hr to begin at 18:00 today 2. Heparin level 8 hours after initiated 3. Daily heparin level and CBC 4. Follow-up after cath tomorrow   Allergies  Allergen Reactions  . Iohexol      Desc: PT STATED TODAY HE GETS SLIGHT SOB FROM IV CONTRAST. HE NEEDS FULL PREMEDS FROM NOW ON PER DR. MATTERN   . Penicillins Itching and Rash    "haven't took none in 40 years"    Patient Measurements: Height: 5\' 11"  (180.3 cm) Weight: 164 lb (74.39 kg) IBW/kg (Calculated) : 75.3  Heparin Dosing Weight: 74.5 kg  Vital Signs: BP: 95/60 mmHg (04/30 1534) Pulse Rate: 110  (04/30 1534)  Labs: Labs pending for today 3/24 SCr 0.95, H/H 12.4/36.2, plt 217 No results found for this basename: HGB:2,HCT:3,PLT:3,APTT:3,LABPROT:3,INR:3,HEPARINUNFRC:3,CREATININE:3,CKTOTAL:3,CKMB:3,TROPONINI:3 in the last 72 hours Estimated Creatinine Clearance: 69.6 ml/min (by C-G formula based on Cr of 0.95).  Medical History: Past Medical History  Diagnosis Date  . Tachycardia, unspecified   . Hyperlipidemia   . Hypertension   . Atypical chest pain   . Other pulmonary embolism and infarction   . Dizziness and giddiness   . Esophageal reflux   . Shortness of breath   . Arthritis   . Personal history of kidney stones   . Diverticulosis   . CAD (coronary artery disease)     nonobstructive    Medications:  Prescriptions prior to admission  Medication Sig Dispense Refill  . albuterol (PROVENTIL,VENTOLIN) 90 MCG/ACT inhaler Inhale 2 puffs into the lungs every 6 (six) hours as needed. For wheezing or shortness of breath      .  clonazePAM (KLONOPIN) 0.5 MG tablet Take 0.5 mg by mouth 2 (two) times daily as needed.      . DULoxetine (CYMBALTA) 30 MG capsule Take 30 mg by mouth daily.      Marland Kitchen losartan (COZAAR) 50 MG tablet Take 50 mg by mouth daily.      . nitroGLYCERIN (NITROSTAT) 0.4 MG SL tablet Place 0.4 mg under the tongue every 5 (five) minutes as needed. For chest pain      . omeprazole (PRILOSEC) 20 MG capsule Take 20 mg by mouth daily.      . Rivaroxaban (XARELTO) 20 MG TABS Take 20 mg by mouth daily.  30 tablet  6  . traZODone (DESYREL) 150 MG tablet By mouth 1/3 to 1 tablet at bedtime as needed for sleep        Lovell Sheehan 07/21/2011,3:47 PM

## 2011-07-21 NOTE — Progress Notes (Signed)
Pt arrived to the floor complaining of dull, achey chest pain on left side of chest, radiating to the jaw. Pt given 2 SL nitro and put on 2 L O2 and chest pain was relieved. EKG obtained. Initially, HR was sustaining in 120's and BP dropped to 90/60's. Ten minutes later HR in 90's, BP 120/70's. Will continue to monitor. Duwaine Maxin, RN

## 2011-07-22 ENCOUNTER — Ambulatory Visit (HOSPITAL_COMMUNITY): Admit: 2011-07-22 | Payer: Medicare Other | Admitting: Cardiovascular Disease

## 2011-07-22 ENCOUNTER — Encounter (HOSPITAL_COMMUNITY)
Admission: AD | Disposition: A | Discharge status: Home / Self Care | Payer: Self-pay | Source: Ambulatory Visit | Attending: Cardiology

## 2011-07-22 DIAGNOSIS — I2510 Atherosclerotic heart disease of native coronary artery without angina pectoris: Secondary | ICD-10-CM

## 2011-07-22 HISTORY — PX: LEFT HEART CATHETERIZATION WITH CORONARY ANGIOGRAM: SHX5451

## 2011-07-22 LAB — CBC
MCH: 31.6 pg (ref 26.0–34.0)
MCHC: 35.7 g/dL (ref 30.0–36.0)
Platelets: 257 10*3/uL (ref 150–400)
RDW: 12.9 % (ref 11.5–15.5)

## 2011-07-22 LAB — LIPID PANEL
Cholesterol: 192 mg/dL (ref 0–200)
Triglycerides: 76 mg/dL (ref <150)
VLDL: 15 mg/dL (ref 0–40)

## 2011-07-22 LAB — CARDIAC PANEL(CRET KIN+CKTOT+MB+TROPI)
CK, MB: 2.5 ng/mL (ref 0.3–4.0)
Troponin I: <0.3 ng/mL (ref <0.30)

## 2011-07-22 LAB — HEPARIN LEVEL (UNFRACTIONATED): Heparin Unfractionated: 0.94 IU/mL — ABNORMAL HIGH (ref 0.30–0.70)

## 2011-07-22 SURGERY — LEFT HEART CATHETERIZATION WITH CORONARY ANGIOGRAM
Anesthesia: LOCAL

## 2011-07-22 MED ORDER — RIVAROXABAN 10 MG PO TABS
20.0000 mg | ORAL_TABLET | Freq: Every day | ORAL | Status: DC
  Administered 2011-07-22: 20 mg via ORAL
  Filled 2011-07-22 (×2): qty 2

## 2011-07-22 MED ORDER — NITROGLYCERIN 0.2 MG/ML ON CALL CATH LAB
INTRAVENOUS | Status: AC
  Filled 2011-07-22: qty 1

## 2011-07-22 MED ORDER — ISOSORBIDE MONONITRATE ER 30 MG PO TB24
30.0000 mg | ORAL_TABLET | Freq: Every day | ORAL | Status: DC
  Administered 2011-07-22 – 2011-07-23 (×2): 30 mg via ORAL
  Filled 2011-07-22 (×2): qty 1

## 2011-07-22 MED ORDER — MIDAZOLAM HCL 2 MG/2ML IJ SOLN
INTRAMUSCULAR | Status: AC
  Filled 2011-07-22: qty 2

## 2011-07-22 MED ORDER — HEPARIN SODIUM (PORCINE) 1000 UNIT/ML IJ SOLN
INTRAMUSCULAR | Status: AC
  Filled 2011-07-22: qty 1

## 2011-07-22 MED ORDER — SODIUM CHLORIDE 0.9 % IV SOLN: INTRAVENOUS | Status: AC

## 2011-07-22 MED ORDER — FENTANYL CITRATE 0.05 MG/ML IJ SOLN
INTRAMUSCULAR | Status: AC
Start: 2011-07-22 — End: 2011-07-22
  Filled 2011-07-22: qty 2

## 2011-07-22 MED ORDER — LIDOCAINE HCL (PF) 1 % IJ SOLN
INTRAMUSCULAR | Status: AC
  Filled 2011-07-22: qty 30

## 2011-07-22 MED ORDER — HEPARIN (PORCINE) IN NACL 2-0.9 UNIT/ML-% IJ SOLN
INTRAMUSCULAR | Status: AC
  Filled 2011-07-22: qty 2000

## 2011-07-22 NOTE — Consult Note (Signed)
Pt was chewing tobacco and quit 1 month ago. Congratulated and encouraged pt to remain tobacco free. He is substituting chewing gum for the chewing tobacco. Discussed relapse prevention strategies. Referred to 1-800 quit now for f/u and support. Discussed oral fixation substitutes, second hand smoke and in home smoking policy. Reviewed and gave pt Written education/contact information.

## 2011-07-22 NOTE — Progress Notes (Signed)
   CARE MANAGEMENT NOTE 07/22/2011  Patient:  [name], [name]   Account Number:  [id number]  Date Initiated:  07/22/2011  Documentation initiated by:  GRAVES-BIGELOW,Merideth Bosque  Subjective/Objective Assessment:   Pt who returns for re-evaluation of chest and jaw pain. Pt was previously on xarelto hx PE- and co pay for pt is 45.00. Pt is from home with wife. Pt is awaiting cath.     Action/Plan:   CM did receive referral  for medicaiton assistance for kidney meds. Pt uses [facility]. Medication not on prfile here and CM called pt's Pharmacy and medication that pt had not purchased was urocit k 15 meq bid.   Anticipated DC Date:  07/24/2011   Anticipated DC Plan:  HOME/SELF CARE      DC Planning Services  CM consult  Medication Assistance      Choice offered to / List presented to:             Status of service:  Completed, signed off Medicare Important Message given?   (If response is "NO", the following Medicare IM given date fields will be blank) Date Medicare IM given:   Date Additional Medicare IM given:    Discharge Disposition:  HOME/SELF CARE  Per UR Regulation:    If discussed at Long Length of Stay Meetings, dates discussed:    Comments:  07-22-11 [address] Tomi Bamberger, RN,BSN [phone] Price of urocit K is 185.00 and potassium Citrate 10 meq will be generic form that should not be any more than 9.00 for co pay. No other needs for CM at this time.

## 2011-07-22 NOTE — Progress Notes (Signed)
ANTICOAGULATION CONSULT NOTE - Follow Up Consult  Pharmacy Consult for heparin Indication: h/o PE, awaiting cath  Labs:  [facility] 07/22/11 0153 07/21/11 2106 07/21/11 1527  HGB 15.3 -- 15.7  HCT 42.9 -- 44.7  PLT 257 -- 278  APTT -- -- 36  LABPROT -- -- 15.2  INR -- -- 1.18  HEPARINUNFRC 0.94* -- --  CREATININE -- -- 0.98  CKTOTAL -- 40 50  CKMB -- 2.5 3.0  TROPONINI -- <0.30 <0.30    Assessment: 78yo male supratherapeutic on heparin with initial dosing for PE while holding Xarelto for h/o PE and awaiting cath @1500  today.  RN notes that IV line was leaking though unclear for how long.  Goal of Therapy:  Heparin level 0.3-0.7 units/ml   Plan:  Will decrease heparin gtt by 2 units/kg/hr to 750 units/hr and check level in 8hr.  Colleen Can PharmD BCPS 07/22/2011,3:13 AM

## 2011-07-22 NOTE — Progress Notes (Signed)
    SUBJECTIVE: NO chest pain this am. NO SOB  BP 133/75  Pulse 69  Temp(Src) 98 F (36.7 C) (Oral)  Resp 18  Ht 5\' 11"  (1.803 m)  Wt 159 lb 12.8 oz (72.485 kg)  BMI 22.29 kg/m2  SpO2 98%  Intake/Output Summary (Last 24 hours) at 07/22/11 0824 Last data filed at 07/22/11 0700  Gross per 24 hour  Intake    120 ml  Output    550 ml  Net   -430 ml    PHYSICAL EXAM General: Well developed, well nourished, in no acute distress. Alert and oriented x 3.  Psych:  Good affect, responds appropriately Neck: No JVD. No masses noted.  Lungs: Clear bilaterally with no wheezes or rhonci noted.  Heart: RRR with no murmurs noted. Abdomen: Bowel sounds are present. Soft, non-tender.  Extremities: No lower extremity edema.   LABS: Basic Metabolic Panel:  Basename 07/21/11 1527  NA 139  K 4.5  CL 103  CO2 27  GLUCOSE 83  BUN 12  CREATININE 0.98  CALCIUM 11.3*  MG --  PHOS --   CBC:  Basename 07/22/11 0153 07/21/11 1527  WBC 5.5 7.5  NEUTROABS -- 4.3  HGB 15.3 15.7  HCT 42.9 44.7  MCV 88.6 89.9  PLT 257 278   Cardiac Enzymes:  Basename 07/22/11 0301 07/21/11 2106 07/21/11 1527  CKTOTAL 34 40 50  CKMB 2.5 2.5 3.0  CKMBINDEX -- -- --  TROPONINI <0.30 <0.30 <0.30   Fasting Lipid Panel:  Basename 07/22/11 0153  CHOL 192  HDL 57  LDLCALC 120*  TRIG 76  CHOLHDL 3.4  LDLDIRECT --    Current Meds:    . aspirin  324 mg Oral NOW   Or  . aspirin  300 mg Rectal NOW  . aspirin  324 mg Oral Pre-Cath  . aspirin EC  81 mg Oral Daily  . diphenhydrAMINE  25 mg Intravenous On Call  . DULoxetine  30 mg Oral Daily  . famotidine (PEPCID) IV  20 mg Intravenous On Call  . heparin  4,000 Units Intravenous Once  . losartan  50 mg Oral Daily  . pantoprazole  40 mg Oral Q1200  . predniSONE  60 mg Oral Pre-Cath   Followed by  . predniSONE  60 mg Oral Pre-Cath  . sodium chloride  3 mL Intravenous Q12H  . sodium chloride  3 mL Intravenous Q12H  . DISCONTD: aspirin EC  81 mg  Oral Daily  . DISCONTD: diphenhydrAMINE  25 mg Intravenous On Call  . DISCONTD: famotidine (PEPCID) IV  20 mg Intravenous On Call     ASSESSMENT AND PLAN:  1. CAD: Admitted from office yesterday with chest pain. Cardiac enzymes negative. Cath 2001 with mild CAD. He is on xarelto for antiocoagulation with h/o PE. This was held after 07/20/11 dose. Heparin drip started last night. Plans for cath this am.  R/B reviewed with pt. Clear liquid breakfast then NPO. Pt has been pre-treated for dye allergy.   Sieanna Vanstone  5/1/20138:24 AM

## 2011-07-22 NOTE — Interval H&P Note (Signed)
History and Physical Interval Note:  07/22/2011 11:22 AM  [name]  has presented today for surgery, with the diagnosis of Chest pain  The various methods of treatment have been discussed with the patient and family. After consideration of risks, benefits and other options for treatment, the patient has consented to  Procedure(s) (LRB): LEFT HEART CATHETERIZATION WITH CORONARY ANGIOGRAM (N/A) as a surgical intervention .  The patients' history has been reviewed, patient examined, no change in status, stable for surgery.  I have reviewed the patients' chart and labs.  Questions were answered to the patient's satisfaction.     Lashan Macias

## 2011-07-22 NOTE — CV Procedure (Signed)
    Cardiac Catheterization Operative Report  [name] [id number] 5/1/201312:07 PM [name], MD, MD  Procedure Performed:  1. Left Heart Catheterization 2. Selective Coronary Angiography 3. Left ventricular angiogram  Operator: Verne Carrow, MD  Arterial access site:  Right radial artery.   Indication: Chest pain known CAD.                                       Procedure Details: The risks, benefits, complications, treatment options, and expected outcomes were discussed with the patient. The patient and/or family concurred with the proposed plan, giving informed consent. The patient was brought to the cath lab after IV hydration was begun and oral premedication was given. The patient was further sedated with Versed. The right wrist was assessed with an Allens test which was positive. The right wrist was prepped and draped in a sterile fashion. 1% lidocaine was used for local anesthesia. Using the modified Seldinger access technique, a 5 French sheath was placed in the right radial artery. 1.25 mg Nicardipine  was given through the sheath. 3500 units IV heparin was given. Standard diagnostic catheters were used to perform selective coronary angiography. A pigtail catheter was used to perform a left ventricular angiogram. The sheath was removed from the right radial artery and a Terumo hemostasis band was applied at the arteriotomy site on the right wrist.    There were no immediate complications. The patient was taken to the recovery area in stable condition.   Hemodynamic Findings: Central aortic pressure: 138/77 Left ventricular pressure: 133/9/13  Angiographic Findings:  Left main: No obstructive disease.   Left Anterior Descending Artery:  Large caliber vessel that courses to the apex. The mid vessel has 30% stenosis at the takeoff of the small to moderate sized diagonal branch. The diagonal branch is a small to moderate sized vessel with 70% ostial  stenosis.   Circumflex Artery: Moderate sized intermediate branch with mild plaque disease. The Circumflex system is small to moderate sized with mild plaque.   Right Coronary Artery: Large dominant artery with mild plaque throughout the proximal and mid vessel.   Left Ventricular Angiogram: LVEF=55%.    Impression: 1. Mild to moderate non-obstructive disease in LAD, Circumflex and RCA 2. Ostial stenosis of small to moderate sized diagonal branch. This is a difficult location for PCI given the angle and ostial location.  3. Preserved LV systolic function.   Recommendations: I would recommend continued medical management at this time. I will add Imdur. Restart Xarelto tonight at 6pm.        Complications:  None. The patient tolerated the procedure well.

## 2011-07-22 NOTE — Progress Notes (Signed)
Pt complaining of 3/10 chest pain radiating to bilateral jaw. Given 1 SL nitro and put on 4 L O2 with complete relief. BP okay. Will page PA.

## 2011-07-22 NOTE — H&P (View-Only) (Signed)
    SUBJECTIVE: NO chest pain this am. NO SOB  BP 133/75  Pulse 69  Temp(Src) 98 F (36.7 C) (Oral)  Resp 18  Ht 5' 11" (1.803 m)  Wt 159 lb 12.8 oz (72.485 kg)  BMI 22.29 kg/m2  SpO2 98%  Intake/Output Summary (Last 24 hours) at 07/22/11 0824 Last data filed at 07/22/11 0700  Gross per 24 hour  Intake    120 ml  Output    550 ml  Net   -430 ml    PHYSICAL EXAM General: Well developed, well nourished, in no acute distress. Alert and oriented x 3.  Psych:  Good affect, responds appropriately Neck: No JVD. No masses noted.  Lungs: Clear bilaterally with no wheezes or rhonci noted.  Heart: RRR with no murmurs noted. Abdomen: Bowel sounds are present. Soft, non-tender.  Extremities: No lower extremity edema.   LABS: Basic Metabolic Panel:  Basename 07/21/11 1527  NA 139  K 4.5  CL 103  CO2 27  GLUCOSE 83  BUN 12  CREATININE 0.98  CALCIUM 11.3*  MG --  PHOS --   CBC:  Basename 07/22/11 0153 07/21/11 1527  WBC 5.5 7.5  NEUTROABS -- 4.3  HGB 15.3 15.7  HCT 42.9 44.7  MCV 88.6 89.9  PLT 257 278   Cardiac Enzymes:  Basename 07/22/11 0301 07/21/11 2106 07/21/11 1527  CKTOTAL 34 40 50  CKMB 2.5 2.5 3.0  CKMBINDEX -- -- --  TROPONINI <0.30 <0.30 <0.30   Fasting Lipid Panel:  Basename 07/22/11 0153  CHOL 192  HDL 57  LDLCALC 120*  TRIG 76  CHOLHDL 3.4  LDLDIRECT --    Current Meds:    . aspirin  324 mg Oral NOW   Or  . aspirin  300 mg Rectal NOW  . aspirin  324 mg Oral Pre-Cath  . aspirin EC  81 mg Oral Daily  . diphenhydrAMINE  25 mg Intravenous On Call  . DULoxetine  30 mg Oral Daily  . famotidine (PEPCID) IV  20 mg Intravenous On Call  . heparin  4,000 Units Intravenous Once  . losartan  50 mg Oral Daily  . pantoprazole  40 mg Oral Q1200  . predniSONE  60 mg Oral Pre-Cath   Followed by  . predniSONE  60 mg Oral Pre-Cath  . sodium chloride  3 mL Intravenous Q12H  . sodium chloride  3 mL Intravenous Q12H  . DISCONTD: aspirin EC  81 mg  Oral Daily  . DISCONTD: diphenhydrAMINE  25 mg Intravenous On Call  . DISCONTD: famotidine (PEPCID) IV  20 mg Intravenous On Call     ASSESSMENT AND PLAN:  1. CAD: Admitted from office yesterday with chest pain. Cardiac enzymes negative. Cath 2001 with mild CAD. He is on xarelto for antiocoagulation with h/o PE. This was held after 07/20/11 dose. Heparin drip started last night. Plans for cath this am.  R/B reviewed with pt. Clear liquid breakfast then NPO. Pt has been pre-treated for dye allergy.   [name]  5/1/20138:24 AM  

## 2011-07-23 ENCOUNTER — Encounter (HOSPITAL_COMMUNITY): Payer: Self-pay | Admitting: Physician Assistant

## 2011-07-23 DIAGNOSIS — R079 Chest pain, unspecified: Principal | ICD-10-CM

## 2011-07-23 DIAGNOSIS — I2699 Other pulmonary embolism without acute cor pulmonale: Secondary | ICD-10-CM

## 2011-07-23 DIAGNOSIS — I2510 Atherosclerotic heart disease of native coronary artery without angina pectoris: Secondary | ICD-10-CM

## 2011-07-23 LAB — CBC
MCH: 30.8 pg (ref 26.0–34.0)
MCHC: 34.1 g/dL (ref 30.0–36.0)
Platelets: 253 10*3/uL (ref 150–400)
RDW: 13.1 % (ref 11.5–15.5)

## 2011-07-23 MED ORDER — ASPIRIN 81 MG PO TBEC: 81.0000 mg | DELAYED_RELEASE_TABLET | Freq: Every day | ORAL | Status: DC

## 2011-07-23 MED FILL — Nicardipine HCl IV Soln 2.5 MG/ML: INTRAVENOUS | Qty: 1 | Status: AC

## 2011-07-23 NOTE — Progress Notes (Signed)
SUBJECTIVE: Jaw pain this am. No chest pain. No events.   BP 140/66  Pulse 59  Temp(Src) 97.8 F (36.6 C) (Oral)  Resp 18  Ht 5\' 11"  (1.803 m)  Wt 157 lb 9.6 oz (71.487 kg)  BMI 21.98 kg/m2  SpO2 97%  PHYSICAL EXAM General: Well developed, well nourished, in no acute distress. Alert and oriented x 3.  Psych:  Good affect, responds appropriately Neck: No JVD. No masses noted.  Lungs: Clear bilaterally with no wheezes or rhonci noted.  Heart: RRR with no murmurs noted. Abdomen: Bowel sounds are present. Soft, non-tender.  Extremities: No lower extremity edema. Right wrist cath site ok.   LABS: Basic Metabolic Panel:  Basename 07/21/11 1527  NA 139  K 4.5  CL 103  CO2 27  GLUCOSE 83  BUN 12  CREATININE 0.98  CALCIUM 11.3*  MG --  PHOS --   CBC:  Basename 07/23/11 0545 07/22/11 0153 07/21/11 1527  WBC 10.5 5.5 --  NEUTROABS -- -- 4.3  HGB 13.3 15.3 --  HCT 39.0 42.9 --  MCV 90.3 88.6 --  PLT 253 257 --   Cardiac Enzymes:  Basename 07/22/11 0301 07/21/11 2106 07/21/11 1527  CKTOTAL 34 40 50  CKMB 2.5 2.5 3.0  CKMBINDEX -- -- --  TROPONINI <0.30 <0.30 <0.30   Fasting Lipid Panel:  Basename 07/22/11 0153  CHOL 192  HDL 57  LDLCALC 120*  TRIG 76  CHOLHDL 3.4  LDLDIRECT --    Current Meds:    . aspirin EC  81 mg Oral Daily  . diphenhydrAMINE  25 mg Intravenous On Call  . DULoxetine  30 mg Oral Daily  . famotidine (PEPCID) IV  20 mg Intravenous On Call  . fentaNYL      . heparin      . heparin      . isosorbide mononitrate  30 mg Oral Daily  . lidocaine      . losartan  50 mg Oral Daily  . midazolam      . nitroGLYCERIN      . pantoprazole  40 mg Oral Q1200  . rivaroxaban  20 mg Oral Q supper  . sodium chloride  3 mL Intravenous Q12H  . DISCONTD: sodium chloride  3 mL Intravenous Q12H   Cardiac Cath 07/23/11:  Left main: No obstructive disease.  Left Anterior Descending Artery: Large caliber vessel that courses to the apex. The mid  vessel has 30% stenosis at the takeoff of the small to moderate sized diagonal branch. The diagonal branch is a small to moderate sized vessel with 70% ostial stenosis.  Circumflex Artery: Moderate sized intermediate branch with mild plaque disease. The Circumflex system is small to moderate sized with mild plaque.  Right Coronary Artery: Large dominant artery with mild plaque throughout the proximal and mid vessel.  Left Ventricular Angiogram: LVEF=55%.    ASSESSMENT AND PLAN:  1. CAD: Admitted from office 07/21/11 with c/o chest pain. Cardiac enzymes negative. Cath yesterday with mild disease in all major epicardial vessels. There is a small to moderate sized diagonal branch with ostial stenosis but not favorable for PCI. I started Imdur last night but he has told me this am that he tried this recently at home on recommendations from his primary care doctor and it caused hypotension and did not help with his chest pain, jaw pain, shoulder pain, abdominal pain. Will d/c Imdur this am. He had been on Lopressor in the past but this was  stopped and Dr. Sherene Sires started Bystolic, however, he has not taken this. He is bradycardic here so will not start beta blocker at this time. Will need to be started on a statin as an outpatient but will not start now with his c/o pain in legs, abdomen, jaw, chest as it may cloud the picture.   2. History of Pulmonary Embolism:  He is on xarelto for chronic anticoagulation with h/o PE and this was restarted last night.  No bleeding from cath site in right wrist.   3. Pain syndrome: He has recent c/o pain in his head, jaw, shoulders, neck, chest, abdomen, legs. The etiology of this is unclear. He has been started on Cymbalta by primary care. Further workup as an outpatient in primary care.   4. Dispo: D/C home this am. Follow up with Hillis Range or Tereso Newcomer, PA-C  in 3 weeks.   Toshiye Kever  5/2/20137:38 AM

## 2011-07-23 NOTE — Progress Notes (Signed)
CARDIAC REHAB PHASE I   PRE:  Rate/Rhythm: 89SR  BP:  Supine:   Sitting: 100/60  Standing:    SaO2: 96%RA  MODE:  Ambulation: 460 ft   POST:  Rate/Rhythem: 104ST  BP:  Supine:   Sitting: 120/64  Standing:    SaO2: 96%RA 0810-0853 Pt walked 460 ft on RA with steady gait. Tolerated well. Jaw pain did not increase with activity. Education on diet and ex completed. Pt declined Phase 2 referral.  [name]

## 2011-07-23 NOTE — Discharge Instructions (Signed)
Radial Site Care Refer to this sheet in the next few weeks. These instructions provide you with information on caring for yourself after your procedure. Your caregiver may also give you more specific instructions. Your treatment has been planned according to current medical practices, but problems sometimes occur. Call your caregiver if you have any problems or questions after your procedure. HOME CARE INSTRUCTIONS  You may shower the day after the procedure.Remove the bandage (dressing) and gently wash the site with plain soap and water.Gently pat the site dry.   Do not apply powder or lotion to the site.   Do not submerge the affected site in water for 3 to 5 days.   Inspect the site at least twice daily.   Do not flex or bend the affected arm for 24 hours.   No lifting over 5 pounds (2.3 kg) for 5 days after your procedure.   Do not drive home if you are discharged the same day of the procedure. Have someone else drive you.   You may drive 24 hours after the procedure unless otherwise instructed by your caregiver.   Do not operate machinery or power tools for 24 hours.   A responsible adult should be with you for the first 24 hours after you arrive home.  What to expect:  Any bruising will usually fade within 1 to 2 weeks.   Blood that collects in the tissue (hematoma) may be painful to the touch. It should usually decrease in size and tenderness within 1 to 2 weeks.  SEEK IMMEDIATE MEDICAL CARE IF:  You have unusual pain at the radial site.   You have redness, warmth, swelling, or pain at the radial site.   You have drainage (other than a small amount of blood on the dressing).   You have chills.   You have a fever or persistent symptoms for more than 72 hours.   You have a fever and your symptoms suddenly get worse.   Your arm becomes pale, cool, tingly, or numb.   You have heavy bleeding from the site. Hold pressure on the site.  Document Released: 04/11/2010  Document Revised: 02/26/2011 Document Reviewed: 04/11/2010 Exit[employer] Patient Information 2012 [employer]. 

## 2011-07-23 NOTE — Discharge Summary (Signed)
Full note this am. cdm 

## 2011-07-23 NOTE — Discharge Summary (Signed)
Discharge Summary   Patient ID: [name] MRN: [id number], DOB/AGE: 09-12-1932 78 y.o. Admit date: 07/21/2011 D/C date:     07/23/2011   Primary Discharge Diagnoses:  1. CAD, nonobstructive by cath 07/22/11 with small to moderate sized diagonal branch with ostial stenosis but not favorable for PCI - not on BB secondary to bradycardia - not on statin due to recent pain syndrome (may need to be considered once clarified) - not on imdur due to hx of hypotension with this, lack of relief 2. Pain syndrome with unclear etiology 3. Hypercalcemia - instructed to f/u PCP  Secondary Discharge Diagnoses:  1. AV block s/p ppm 2. HTN 3. HL 4. Hx of PE, anticoagulated with Xarelto 5. COPD 6. GERD 7. Bilateral internal carotid artery aneurysms followed by Dr. Arbie Cookey 8. Prostate CA s/p cryotherapy 9. Hx of kidney stones 10. Arthritis 11. Diverticulosis  Hospital Course: 78 y/o M with hx nonobstructive CAD, AV block, PE. Last studies were [facility] 02/2011 with EF 67%, no ischemia, & echocardiogram 06/17/11: Mild focal basal septal hypertrophy, moderate LVH, EF 55-60%, mild AI. He was recently admitted 3/23-3/24 with symptomatic hypotension in the setting of isosorbide which had recently been initiated. He was hydrated with IV fluids and isosorbide was discontinued. He had CP but MI was ruled out. He saw Tereso Newcomer PA-C in the office as an add on due to chest, bilateral arm and bilateral jaw pain lasting minutes to hours with associated shortness of breath, nausea and occasional diaphoresis.  Activity did not seem to make symptoms worse. He denied association with meals. He saw Dr. Sherene Sires on 4/3 and Lopressor was changed to Bystolic. EKG demonstrated sinus rhythm, heart rate 62, normal axis, no change from prior tracing. Given ongoing pain, cath was recommended and thus he was admitted to the hospital. Xarelto was held and heparin was started after next dose was due. He was pre-treated for IV dye  allergy. Cardiac enzymes remained negative. He underwent left heart cath demonstrating mild to moderate non-obstructive disease in LAD, Circumflex and RCA, ostial stenosis of small to moderate sized diagonal branch which was felt to be a difficult location for PCI given the angle and ostial location. Dr. Clifton James recommended med rx but this remained limited. Initially Imdur was recommended, but given hx of hypotension and lack of prior relief with this, this was discontinued. Beta blocker was held because of bradycardia. His Hgb decreased slightly to 13.3 on day of discharge but was still WNL; this was likely secondary to hydration. I discussed this with Dr. Clifton James and given that he is already on one anticoagulant agent, we will discontinue aspirin and continue Xarelto. The patient was also noting recent pain in his head, jaw, shoulders, neck, chest, abdomen and legs with etiology unclear. Therefore statin was not started. He has recently been started on Cymbalta by primary care and was encouraged to continue workup as OP with PCP. His Ca++ was also noted to be mildly elevated and he was instructed to f/u with PCP for this as well if he has not already had workup for it. The patient was seen and examined today and felt stable for discharge by Dr. Clifton James.  Discharge Vitals: Blood pressure 110/63, pulse 74, temperature 97.8 F (36.6 C), temperature source Oral, resp. rate 18, height 5\' 11"  (1.803 m), weight 157 lb 9.6 oz (71.487 kg), SpO2 97.00%.  Labs: Lab Results  Component Value Date   WBC 10.5 07/23/2011   HGB 13.3 07/23/2011  HCT 39.0 07/23/2011   MCV 90.3 07/23/2011   PLT 253 07/23/2011     Lab 07/21/11 1527  NA 139  K 4.5  CL 103  CO2 27  BUN 12  CREATININE 0.98  CALCIUM 11.3*  PROT 7.2  BILITOT 0.9  ALKPHOS 98  ALT 16  AST 21  GLUCOSE 83    Basename 07/22/11 0301 07/21/11 2106 07/21/11 1527  CKTOTAL 34 40 50  CKMB 2.5 2.5 3.0  TROPONINI <0.30 <0.30 <0.30   Lab Results  Component  Value Date   CHOL 192 07/22/2011   HDL 57 07/22/2011   LDLCALC 960* 07/22/2011   TRIG 76 07/22/2011   Diagnostic Studies/Procedures   Cardiac catheterization this admission, please see full report and above for summary.  Discharge Medications   Medication List  As of 07/23/2011  9:54 AM   TAKE these medications         albuterol 90 MCG/ACT inhaler   Commonly known as: PROVENTIL,VENTOLIN   Inhale 2 puffs into the lungs every 6 (six) hours as needed. For wheezing or shortness of breath      DULoxetine 30 MG capsule   Commonly known as: CYMBALTA   Take 30 mg by mouth daily.      losartan 50 MG tablet   Commonly known as: COZAAR   Take 50 mg by mouth daily.      nitroGLYCERIN 0.4 MG SL tablet   Commonly known as: NITROSTAT   Place 0.4 mg under the tongue every 5 (five) minutes as needed. For chest pain      omeprazole 20 MG capsule   Commonly known as: PRILOSEC   Take 20 mg by mouth daily.      Rivaroxaban 20 MG Tabs   Take 20 mg by mouth daily.      traZODone 150 MG tablet   Commonly known as: DESYREL   By mouth 1/3 to 1 tablet at bedtime as needed for sleep            Disposition   The patient will be discharged in stable condition to home. Discharge Orders    Future Appointments: Provider: Department: Dept Phone: Center:   08/10/2011 2:45 PM Hillis Range, MD [facility] [phone] [facility]   08/25/2011 9:30 AM [facility] [phone] [facility]   08/25/2011 10:30 AM Larina Earthly, MD [facility] [phone] [facility]   09/25/2011 8:40 AM [facility] [phone] [facility]     Future Orders Please Complete By Expires   Diet - low sodium heart healthy      Increase activity slowly      Comments:   No driving for 2 days. No lifting over 5 lbs for 1 week. No sexual activity for 1 week. Keep procedure site clean & dry. If you notice increased pain, swelling, bleeding or pus, call/return!  You may shower, but no  soaking baths/hot tubs/pools for 1 week.     Discharge instructions      Comments:   Your calcium level was mildly elevated on your labwork. Please follow up with your primary doctor regarding this level as it may require further workup. You should also follow up with your primary doctor regarding your pain complaints.     Follow-up Information    Follow up with Hillis Range, MD. (08/10/11 at 2:45pm)    Contact information:   [address] [place] [address] [phone]       Follow  up with Primary Care Doctor. (Please make appointment to discuss further workup of your pain complaints and mildly elevated calcium level)            Duration of Discharge Encounter: Greater than 30 minutes including physician and PA time.  Signed, Ronie Spies PA-C 07/23/2011, 9:54 AM

## 2011-08-10 ENCOUNTER — Encounter: Payer: Self-pay | Admitting: Internal Medicine

## 2011-08-10 ENCOUNTER — Ambulatory Visit (INDEPENDENT_AMBULATORY_CARE_PROVIDER_SITE_OTHER): Payer: Medicare Other | Admitting: Internal Medicine

## 2011-08-10 VITALS — BP 110/60 | HR 88 | Ht 71.0 in | Wt 163.1 lb

## 2011-08-10 DIAGNOSIS — I2699 Other pulmonary embolism without acute cor pulmonale: Secondary | ICD-10-CM

## 2011-08-10 DIAGNOSIS — I442 Atrioventricular block, complete: Secondary | ICD-10-CM

## 2011-08-10 DIAGNOSIS — I2510 Atherosclerotic heart disease of native coronary artery without angina pectoris: Secondary | ICD-10-CM

## 2011-08-10 DIAGNOSIS — R6884 Jaw pain: Secondary | ICD-10-CM

## 2011-08-10 LAB — PACEMAKER DEVICE OBSERVATION
AL AMPLITUDE: 3.6 mv
AL IMPEDENCE PM: 450 Ohm
AL THRESHOLD: 1.5 V
BAMS-0003: 65 {beats}/min
RV LEAD AMPLITUDE: 7 mv

## 2011-08-10 MED ORDER — METOPROLOL SUCCINATE ER 25 MG PO TB24: 25.0000 mg | ORAL_TABLET | Freq: Every day | ORAL | Status: DC

## 2011-08-10 NOTE — Progress Notes (Signed)
PCP:  [name], MD, MD  The patient presents today for routine electrophysiology followup.  Since last being seen in our clinic, the patient reports doing reasonably well.  His cath revealed predominantly nonobstructive disease,  Small branch obstructive disease was observed and medical therapy was advised.  His jaw pain is much improved.  Presently, he is concerned with ongoing bilateral flank pain.  He is being evaluated by his PCP for this. Today, he denies symptoms of palpitations, chest pain, shortness of breath, orthopnea, PND, lower extremity edema, dizziness, presyncope, or syncope.  The patient feels that he is tolerating medications without difficulties and is otherwise without complaint today.   Past Medical History  Diagnosis Date  . Hyperlipidemia   . Hypertension   . Esophageal reflux   . Diverticulosis   . Rheumatic fever ~ 1944  . Vision loss of left eye     S/P "cataract OR"; "can see a little; not much"  . Kidney stones   . Hard of hearing, left   . Pulmonary embolism     On Xarelto  . Hypercalcemia   . Arthritis     "knees"  . CAD (coronary artery disease)     nonobstructive by cath 07/22/11 with small to moderate sized diagonal branch with ostial stenosis , medical therapy advised  . Prostate cancer     s/p cryotherapy  . AV block     s/p PPM  . COPD (chronic obstructive pulmonary disease)   . Carotid artery aneurysm     Bilateral internal carotid artery aneurysms followed by Dr. Arbie Cookey   Past Surgical History  Procedure Date  . Cystoscopy   . Lung surgery 1957    "born w/bleb in [place]  . Insert / replace / remove pacemaker 1991    initial placement  . Insert / replace / remove pacemaker ~ 2011    "changed out"  . Tonsillectomy and adenoidectomy 1943  . Appendectomy 2000's  . Cholecystectomy 2000's  . Cataract extraction w/ intraocular lens implant ~ 2008    left eye  . Lithotripsy ~ 2011    "twice"  . Cardiac catheterization   .  Prostate cryoablation ~ 2008    Current Outpatient Prescriptions  Medication Sig Dispense Refill  . albuterol (PROVENTIL,VENTOLIN) 90 MCG/ACT inhaler Inhale 2 puffs into the lungs every 6 (six) hours as needed. For wheezing or shortness of breath      . BESIVANCE 0.6 % SUSP Place 1 drop into the right eye 3 (three) times daily.       . DULoxetine (CYMBALTA) 30 MG capsule Take 30 mg by mouth daily.      Marland Kitchen losartan (COZAAR) 50 MG tablet Take 50 mg by mouth daily.      Marland Kitchen LOTEMAX 0.5 % GEL Place 1 drop into the right eye 3 (three) times daily.       Marland Kitchen NEVANAC 0.1 % ophthalmic suspension Place 1 drop into the right eye 3 (three) times daily.       . nitroGLYCERIN (NITROSTAT) 0.4 MG SL tablet Place 0.4 mg under the tongue every 5 (five) minutes as needed. For chest pain      . omeprazole (PRILOSEC) 20 MG capsule Take 20 mg by mouth daily.      . Rivaroxaban (XARELTO) 20 MG TABS Take 20 mg by mouth daily.  30 tablet  6  . SPIRIVA HANDIHALER 18 MCG inhalation capsule as needed.      . traZODone (DESYREL) 150 MG tablet  By mouth 1/3 to 1 tablet at bedtime as needed for sleep      . DISCONTD: albuterol (PROVENTIL,VENTOLIN) 90 MCG/ACT inhaler Inhale 2 puffs into the lungs every 6 (six) hours as needed for wheezing or shortness of breath.  17 g  3    Allergies  Allergen Reactions  . Iohexol      Desc: PT STATED TODAY HE GETS SLIGHT SOB FROM IV CONTRAST. HE NEEDS FULL PREMEDS FROM NOW ON PER DR. MATTERN   . Penicillins Itching and Rash    "haven't took none in 40 years"    History   Social History  . Marital Status: Married    Spouse Name: N/A    Number of Children: 1  . Years of Education: N/A   Occupational History  . Retired     [employer]al architect    Social History Main Topics  . Smoking status: Former Smoker -- 1.0 packs/day for 50 years    Types: Cigarettes    Quit date: 03/23/2004  . Smokeless tobacco: Former Neurosurgeon    Types: Chew    Quit date: 06/22/2011  . Alcohol Use: No  . Drug  Use: No  . Sexually Active: Not Currently   Other Topics Concern  . Not on file   Social History Narrative   2 caffeine drink daily     Family History  Problem Relation Age of Onset  . Hypertension Neg Hx   . Hyperlipidemia Neg Hx   . Heart failure Neg Hx   . Heart disease Neg Hx   . Colon cancer Neg Hx   . Breast cancer Mother   . Emphysema Sister     was a smoker    Physical Exam: Filed Vitals:   08/10/11 1346  BP: 110/60  Pulse: 88  Height: 5\' 11"  (1.803 m)  Weight: 163 lb 1.9 oz (73.991 kg)    GEN- The patient is well appearing, alert and oriented x 3 today.   Head- normocephalic, atraumatic Eyes-  Sclera clear, conjunctiva pink Ears- hearing intact Oropharynx- clear Neck- supple, no JVP Lymph- no cervical lymphadenopathy Lungs- Clear to ausculation bilaterally, normal work of breathing Chest- pacemaker pocket is well healed Heart- Regular rate and rhythm, no murmurs, rubs or gallops, PMI not laterally displaced GI- soft, NT, ND, + BS Extremities- no clubbing, cyanosis, or edema  Pacemaker interrogation- reviewed in detail today,  See PACEART report  Assessment and Plan:

## 2011-08-10 NOTE — Assessment & Plan Note (Signed)
Jaw pain is improved.  This is unlikely cardiac.  He also has flank pain of unclear etiology.  His PCP will workup this up further.

## 2011-08-10 NOTE — Assessment & Plan Note (Signed)
Normal pacemaker function See Pace Art report No changes today  

## 2011-08-10 NOTE — Assessment & Plan Note (Signed)
Continue xarelto

## 2011-08-10 NOTE — Patient Instructions (Signed)
Your physician wants you to follow-up in: 12 months with Dr Jacquiline Doe will receive a reminder letter in the mail two months in advance. If you don't receive a letter, please call our office to schedule the follow-up appointment.   Remote monitoring is used to monitor your Pacemaker of ICD from home. This monitoring reduces the number of office visits required to check your device to one time per year. It allows [place] to keep an eye on the functioning of your device to ensure it is working properly. You are scheduled for a device check from home on 11/12/11. You may send your transmission at any time that day. If you have a wireless device, the transmission will be sent automatically. After your physician reviews your transmission, you will receive a postcard with your next transmission date.  Your physician has recommended you make the following change in your medication:  1) Start Toprol 25mg  daily

## 2011-08-10 NOTE — Assessment & Plan Note (Signed)
Medical therapy advised I will add toprol xl 25mg  daily today

## 2011-08-12 ENCOUNTER — Other Ambulatory Visit: Payer: Self-pay | Admitting: Family Medicine

## 2011-08-12 DIAGNOSIS — R0781 Pleurodynia: Secondary | ICD-10-CM

## 2011-08-14 ENCOUNTER — Inpatient Hospital Stay: Admission: RE | Admit: 2011-08-14 | Payer: Medicare Other | Source: Ambulatory Visit

## 2011-08-18 ENCOUNTER — Ambulatory Visit
Admission: RE | Admit: 2011-08-18 | Discharge: 2011-08-18 | Disposition: A | Discharge status: Home / Self Care | Payer: Medicare Other | Source: Ambulatory Visit | Attending: Family Medicine | Admitting: Family Medicine

## 2011-08-18 ENCOUNTER — Ambulatory Visit: Payer: Medicare Other | Admitting: Vascular Surgery

## 2011-08-18 DIAGNOSIS — R0781 Pleurodynia: Secondary | ICD-10-CM

## 2011-08-21 ENCOUNTER — Other Ambulatory Visit: Payer: Self-pay | Admitting: Vascular Surgery

## 2011-08-24 ENCOUNTER — Encounter: Payer: Self-pay | Admitting: Vascular Surgery

## 2011-08-25 ENCOUNTER — Ambulatory Visit
Admission: RE | Admit: 2011-08-25 | Discharge: 2011-08-25 | Disposition: A | Discharge status: Home / Self Care | Payer: Medicare Other | Source: Ambulatory Visit | Attending: Vascular Surgery | Admitting: Vascular Surgery

## 2011-08-25 ENCOUNTER — Ambulatory Visit (INDEPENDENT_AMBULATORY_CARE_PROVIDER_SITE_OTHER): Payer: Medicare Other | Admitting: Vascular Surgery

## 2011-08-25 ENCOUNTER — Other Ambulatory Visit (HOSPITAL_COMMUNITY): Payer: Self-pay | Admitting: Family Medicine

## 2011-08-25 ENCOUNTER — Encounter: Payer: Self-pay | Admitting: Vascular Surgery

## 2011-08-25 VITALS — BP 156/76 | HR 66 | Temp 97.4°F | Ht 71.0 in | Wt 162.0 lb

## 2011-08-25 DIAGNOSIS — I6529 Occlusion and stenosis of unspecified carotid artery: Secondary | ICD-10-CM

## 2011-08-25 DIAGNOSIS — R222 Localized swelling, mass and lump, trunk: Secondary | ICD-10-CM

## 2011-08-25 DIAGNOSIS — I720 Aneurysm of carotid artery: Secondary | ICD-10-CM

## 2011-08-25 MED ORDER — IOHEXOL 350 MG/ML SOLN
100.0000 mL | Freq: Once | INTRAVENOUS | Status: AC | PRN
  Administered 2011-08-25: 100 mL via INTRAVENOUS

## 2011-08-25 NOTE — Progress Notes (Signed)
The patient presents today for continued followup of his bilateral distal internal carotid artery pseudoaneurysms. These were followed incidentally in a workup for dizziness approximately 12 years ago. He has had serial CT angiogram followup since that time. Fortunately the suction no evidence of increase in size. He has had no neurologic deficits specifically no amaurosis fugax, transient ischemic attack or stroke. He does have some chronic neck stiffness and headache.  Past Medical History  Diagnosis Date  . Hyperlipidemia   . Hypertension   . Esophageal reflux   . Diverticulosis   . Rheumatic fever ~ 1944  . Vision loss of left eye     S/P "cataract OR"; "can see a little; not much"  . Kidney stones   . Hard of hearing, left   . Pulmonary embolism     On Xarelto  . Hypercalcemia   . Arthritis     "knees"  . CAD (coronary artery disease)     nonobstructive by cath 07/22/11 with small to moderate sized diagonal branch with ostial stenosis , medical therapy advised  . Prostate cancer     s/p cryotherapy  . AV block     s/p PPM  . COPD (chronic obstructive pulmonary disease)   . Carotid artery aneurysm     Bilateral internal carotid artery aneurysms followed by Dr. Arbie Cookey    History  Substance Use Topics  . Smoking status: Former Smoker -- 1.0 packs/day for 50 years    Types: Cigarettes    Quit date: 03/23/2004  . Smokeless tobacco: Former Neurosurgeon    Types: Chew    Quit date: 06/22/2011  . Alcohol Use: No    Family History  Problem Relation Age of Onset  . Hypertension Neg Hx   . Hyperlipidemia Neg Hx   . Heart failure Neg Hx   . Heart disease Neg Hx   . Colon cancer Neg Hx   . Breast cancer Mother   . Emphysema Sister     was a smoker    Allergies  Allergen Reactions  . Iohexol      Desc: PT STATED TODAY HE GETS SLIGHT SOB FROM IV CONTRAST. HE NEEDS FULL PREMEDS FROM NOW ON PER DR. MATTERN   . Penicillins Itching and Rash    "haven't took none in 40 years"     Current outpatient prescriptions:albuterol (PROVENTIL,VENTOLIN) 90 MCG/ACT inhaler, Inhale 2 puffs into the lungs every 6 (six) hours as needed. For wheezing or shortness of breath, Disp: , Rfl: ;  BESIVANCE 0.6 % SUSP, Place 1 drop into the right eye 3 (three) times daily. , Disp: , Rfl: ;  DULoxetine (CYMBALTA) 30 MG capsule, Take 30 mg by mouth daily., Disp: , Rfl: ;  losartan (COZAAR) 50 MG tablet, Take 50 mg by mouth daily., Disp: , Rfl:  LOTEMAX 0.5 % GEL, Place 1 drop into the right eye 3 (three) times daily. , Disp: , Rfl: ;  metoprolol succinate (TOPROL XL) 25 MG 24 hr tablet, Take 1 tablet (25 mg total) by mouth daily., Disp: 30 tablet, Rfl: 6;  NEVANAC 0.1 % ophthalmic suspension, Place 1 drop into the right eye 3 (three) times daily. , Disp: , Rfl:  nitroGLYCERIN (NITROSTAT) 0.4 MG SL tablet, Place 0.4 mg under the tongue every 5 (five) minutes as needed. For chest pain, Disp: , Rfl: ;  omeprazole (PRILOSEC) 20 MG capsule, Take 20 mg by mouth daily., Disp: , Rfl: ;  Rivaroxaban (XARELTO) 20 MG TABS, Take 20 mg by mouth daily.,  Disp: 30 tablet, Rfl: 6;  SPIRIVA HANDIHALER 18 MCG inhalation capsule, as needed., Disp: , Rfl:  traZODone (DESYREL) 150 MG tablet, By mouth 1/3 to 1 tablet at bedtime as needed for sleep, Disp: , Rfl: ;  DISCONTD: albuterol (PROVENTIL,VENTOLIN) 90 MCG/ACT inhaler, Inhale 2 puffs into the lungs every 6 (six) hours as needed for wheezing or shortness of breath., Disp: 17 g, Rfl: 3 No current facility-administered medications for this visit. Facility-Administered Medications Ordered in Other Visits: iohexol (OMNIPAQUE) 350 MG/ML injection 100 mL, 100 mL, Intravenous, Once PRN, Medication Radiologist, MD, 100 mL at 08/25/11 1020  BP 156/76  Pulse 66  Temp(Src) 97.4 F (36.3 C) (Oral)  Ht 5\' 11"  (1.803 m)  Wt 162 lb (73.483 kg)  BMI 22.59 kg/m2  Body mass index is 22.59 kg/(m^2).       Physical exam: Well-developed well-nourished white male in no acute  distress Neurologically grossly intact Carotid arteries without bruits bilaterally with 2+ carotid pulses and 2+ radial pulses bilaterally Heart regular rhythm without murmur Chest clear bilaterally without wheezes Extremities without major deformity  CT angiogram of her carotids from today were reviewed. This revealed no change in the distal 1 cm pseudoaneurysms at the skull base bilaterally  Impression and plan: Stable bilateral distal carotid artery pseudoaneurysms. These have been essentially unchanged for approximately 12 years. Have recommended a repeat CT scan in 2 years for continued followup. The patient knows to notify should he develop any neurologic deficits.

## 2011-08-26 NOTE — Progress Notes (Signed)
Addended by: Sharee Pimple on: 08/26/2011 10:19 AM   Modules accepted: Orders

## 2011-08-30 ENCOUNTER — Emergency Department (HOSPITAL_COMMUNITY): Payer: Medicare Other

## 2011-08-30 ENCOUNTER — Encounter (HOSPITAL_COMMUNITY): Payer: Self-pay | Admitting: Emergency Medicine

## 2011-08-30 ENCOUNTER — Emergency Department (HOSPITAL_COMMUNITY)
Admission: EM | Admit: 2011-08-30 | Discharge: 2011-08-30 | Disposition: A | Discharge status: Home / Self Care | Payer: Medicare Other | Attending: Emergency Medicine | Admitting: Emergency Medicine

## 2011-08-30 DIAGNOSIS — R0602 Shortness of breath: Secondary | ICD-10-CM | POA: Insufficient documentation

## 2011-08-30 DIAGNOSIS — R209 Unspecified disturbances of skin sensation: Secondary | ICD-10-CM | POA: Insufficient documentation

## 2011-08-30 DIAGNOSIS — R079 Chest pain, unspecified: Secondary | ICD-10-CM | POA: Insufficient documentation

## 2011-08-30 DIAGNOSIS — I2510 Atherosclerotic heart disease of native coronary artery without angina pectoris: Secondary | ICD-10-CM | POA: Insufficient documentation

## 2011-08-30 DIAGNOSIS — I10 Essential (primary) hypertension: Secondary | ICD-10-CM | POA: Insufficient documentation

## 2011-08-30 DIAGNOSIS — R109 Unspecified abdominal pain: Secondary | ICD-10-CM | POA: Insufficient documentation

## 2011-08-30 DIAGNOSIS — Z79899 Other long term (current) drug therapy: Secondary | ICD-10-CM | POA: Insufficient documentation

## 2011-08-30 DIAGNOSIS — IMO0001 Reserved for inherently not codable concepts without codable children: Secondary | ICD-10-CM | POA: Insufficient documentation

## 2011-08-30 LAB — BASIC METABOLIC PANEL
BUN: 10 mg/dL (ref 6–23)
Chloride: 102 mEq/L (ref 96–112)
Glucose, Bld: 108 mg/dL — ABNORMAL HIGH (ref 70–99)
Potassium: 4.9 mEq/L (ref 3.5–5.1)

## 2011-08-30 LAB — DIFFERENTIAL
Eosinophils Absolute: 0 10*3/uL (ref 0.0–0.7)
Lymphs Abs: 2.1 10*3/uL (ref 0.7–4.0)
Monocytes Relative: 8 % (ref 3–12)
Neutrophils Relative %: 63 % (ref 43–77)

## 2011-08-30 LAB — CBC
Hemoglobin: 16.6 g/dL (ref 13.0–17.0)
MCH: 31.3 pg (ref 26.0–34.0)
RBC: 5.3 MIL/uL (ref 4.22–5.81)

## 2011-08-30 LAB — POCT I-STAT TROPONIN I

## 2011-08-30 MED ORDER — MORPHINE SULFATE 4 MG/ML IJ SOLN
4.0000 mg | Freq: Once | INTRAMUSCULAR | Status: AC
  Administered 2011-08-30: 4 mg via INTRAVENOUS
  Filled 2011-08-30: qty 1

## 2011-08-30 MED ORDER — ONDANSETRON HCL 4 MG/2ML IJ SOLN
4.0000 mg | Freq: Once | INTRAMUSCULAR | Status: AC
  Administered 2011-08-30: 4 mg via INTRAVENOUS
  Filled 2011-08-30: qty 2

## 2011-08-30 NOTE — ED Notes (Signed)
Pharmacy at bedside discussing OTC pain medications while using rivaroxaban

## 2011-08-30 NOTE — ED Provider Notes (Signed)
History     CSN: [id number]  Arrival date & time 08/30/11  1152   First MD Initiated Contact with Patient 08/30/11 1234      Chief Complaint  Patient presents with  . Numbness  . Jaw Pain  . Chest Pain  . Shortness of Breath    (Consider location/radiation/quality/duration/timing/severity/associated sxs/prior treatment) HPI History from patient, family and previous chart. 78 year old male with PMH HLD, HTN, CAD, emphysema who presents with complaint of "numbness and pain all over." This apparently has been a problem almost constantly for the past 6 months and has been intermittent in nature. He presents today as he has increased numbness and discomfort. Symptoms are located in the chest, neck, back, and abdomen; he endorses associated shortness of breath, slight dizziness, and nausea. Denies diaphoresis, vomiting, visual change, difficulty walking.  Review of previous chart indicates that pt has been extensively worked up from cardiology standpoint for this and had cath in May of this year which showed nonobstructive CAD that could be treated medically. He does have a remote hx of PE but is on Xarelto which he has been taking as prescribed. Pt does have a possible nodule to his lung per recent CT chest and is scheduled to have PET for further eval. He is a former smoker.  Past Medical History  Diagnosis Date  . Hyperlipidemia   . Hypertension   . Esophageal reflux   . Diverticulosis   . Rheumatic fever ~ 1944  . Vision loss of left eye     S/P "cataract OR"; "can see a little; not much"  . Kidney stones   . Hard of hearing, left   . Pulmonary embolism     On Xarelto  . Hypercalcemia   . Arthritis     "knees"  . CAD (coronary artery disease)     nonobstructive by cath 07/22/11 with small to moderate sized diagonal branch with ostial stenosis , medical therapy advised  . Prostate cancer     s/p cryotherapy  . AV block     s/p PPM  . COPD (chronic obstructive pulmonary disease)    . Carotid artery aneurysm     Bilateral internal carotid artery aneurysms followed by Dr. Arbie Cookey    Past Surgical History  Procedure Date  . Cystoscopy   . Lung surgery 1957    "born w/bleb in [place]  . Insert / replace / remove pacemaker 1991    initial placement  . Insert / replace / remove pacemaker ~ 2011    "changed out"  . Tonsillectomy and adenoidectomy 1943  . Appendectomy 2000's  . Cholecystectomy 2000's  . Cataract extraction w/ intraocular lens implant ~ 2008    left eye  . Lithotripsy ~ 2011    "twice"  . Cardiac catheterization   . Prostate cryoablation ~ 2008    Family History  Problem Relation Age of Onset  . Hypertension Neg Hx   . Hyperlipidemia Neg Hx   . Heart failure Neg Hx   . Heart disease Neg Hx   . Colon cancer Neg Hx   . Breast cancer Mother   . Emphysema Sister     was a smoker    History  Substance Use Topics  . Smoking status: Former Smoker -- 1.0 packs/day for 50 years    Types: Cigarettes    Quit date: 03/23/2004  . Smokeless tobacco: Former Neurosurgeon    Types: Chew    Quit date: 06/22/2011  . Alcohol Use: No  Review of Systems  Constitutional: Negative for fever, chills, activity change and appetite change.  Eyes: Negative for visual disturbance.  Respiratory: Positive for shortness of breath. Negative for cough and chest tightness.   Cardiovascular: Positive for chest pain.  Gastrointestinal: Positive for abdominal pain. Negative for nausea, vomiting and diarrhea.  Musculoskeletal: Positive for myalgias.  Skin: Negative for color change and rash.  Neurological: Positive for numbness. Negative for dizziness and syncope.  All other systems reviewed and are negative.    Allergies  Iohexol and Penicillins  Home Medications   Current Outpatient Rx  Name Route Sig Dispense Refill  . ALBUTEROL 90 MCG/ACT IN AERS Inhalation Inhale 2 puffs into the lungs every 6 (six) hours as needed. For wheezing or shortness of breath    .  BESIVANCE 0.6 % OP SUSP Right Eye Place 1 drop into the right eye 3 (three) times daily.     . DULOXETINE HCL 30 MG PO CPEP Oral Take 30 mg by mouth daily.    Marland Kitchen LOSARTAN POTASSIUM 50 MG PO TABS Oral Take 50 mg by mouth daily.    Marland Kitchen LOTEMAX 0.5 % OP GEL Right Eye Place 1 drop into the right eye 3 (three) times daily.     Marland Kitchen METOPROLOL SUCCINATE ER 25 MG PO TB24 Oral Take 1 tablet (25 mg total) by mouth daily. 30 tablet 6  . NEVANAC 0.1 % OP SUSP Right Eye Place 1 drop into the right eye 3 (three) times daily.     Marland Kitchen NITROGLYCERIN 0.4 MG SL SUBL Sublingual Place 0.4 mg under the tongue every 5 (five) minutes as needed. For chest pain    . OMEPRAZOLE 20 MG PO CPDR Oral Take 20 mg by mouth daily.    Marland Kitchen RIVAROXABAN 20 MG PO TABS Oral Take 20 mg by mouth daily. 30 tablet 6  . SPIRIVA HANDIHALER 18 MCG IN CAPS Inhalation Place 18 mcg into inhaler and inhale daily.       BP 137/73  Pulse 81  Temp(Src) 98.2 F (36.8 C) (Oral)  Resp 16  SpO2 96%  Physical Exam  Nursing note and vitals reviewed. Constitutional: He is oriented to person, place, and time. He appears well-developed and well-nourished. No distress.       Pt tearful  HENT:  Head: Normocephalic and atraumatic.  Mouth/Throat: Oropharynx is clear and moist. No oropharyngeal exudate.  Eyes: EOM are normal. Pupils are equal, round, and reactive to light.  Neck: Normal range of motion.  Cardiovascular: Normal rate, regular rhythm and normal heart sounds.  Exam reveals no gallop and no friction rub.   No murmur heard. Pulmonary/Chest: Effort normal and breath sounds normal. He has no wheezes. He exhibits no tenderness.       On distracted exam, pt with no tenderness to chest wall  Abdominal: Soft. Bowel sounds are normal. He exhibits no distension and no mass. There is no tenderness. There is no rebound and no guarding.       On distracted exam, pt with no abdominal tenderness  Musculoskeletal: Normal range of motion.  Neurological: He is  alert and oriented to person, place, and time. No cranial nerve deficit or sensory deficit. He exhibits normal muscle tone. Coordination normal. GCS eye subscore is 4. GCS verbal subscore is 5. GCS motor subscore is 6.       Unable to test visual fields to L eye; pt s/p cataract surgery to this eye and has very little vision in the eye. Full fields  to R eye.  Skin: Skin is warm and dry. He is not diaphoretic.  Psychiatric: He has a normal mood and affect.    ED Course  Procedures (including critical care time)   Date: 08/30/2011  Rate: 98  Rhythm: normal sinus rhythm  QRS Axis: normal  Intervals: normal  ST/T Wave abnormalities: nonspecific ST changes  Conduction Disutrbances:none  Narrative Interpretation:   Old EKG Reviewed: as compared with June 13 2011 rate increased, PR shorter, no other significant changes    Labs Reviewed  BASIC METABOLIC PANEL - Abnormal; Notable for the following:    Glucose, Bld 108 (*)    Calcium 11.3 (*)    GFR calc non Af Amer 78 (*)    All other components within normal limits  CBC  DIFFERENTIAL  PRO B NATRIURETIC PEPTIDE  POCT I-STAT TROPONIN I   Dg Chest 2 View  08/30/2011  *RADIOLOGY REPORT*  Clinical Data: Shortness of breath, numbness  CHEST - 2 VIEW  Comparison: 06/14/2011 and CT scan 08/18/2011  Findings: Cardiomediastinal silhouette is stable.  Dual lead cardiac pacemaker is unchanged in position.  Hyperinflation again noted. No acute infiltrate or pulmonary edema.  Stable osteopenia and degenerative changes thoracic spine.  Again noted 1.3 cm nodule in the left upper lobe.  Again further evaluation with PET scan is recommended to exclude malignancy.  IMPRESSION:  Hyperinflation again noted. No acute infiltrate or pulmonary edema.  Stable osteopenia and degenerative changes thoracic spine. Again noted 1.3 cm nodule in the left upper lobe.  Again further evaluation with PET scan is recommended to exclude malignancy.  Original Report Authenticated  By: Natasha Mead, M.D.     1. CHEST PAIN UNSPECIFIED       MDM  Pt with atypical ?pain syndrome which has been persistent x 6 mos. Has been evaluated from cardiology standpoint including cath without etiology. On my exam, he is nontender to palpation. Doubt ACS given recent clean cath, nonprovocative ECG and negative troponin; he does have hx of PE but is anticoagulated with Xarelto so doubt this as etiology. He has a nonfocal neurologic exam. Labs reassuring today. Feel pt stable for discharge home at this time with PCP follow up. Return precautions discussed.  Case d/w Dr. Fonnie Jarvis who saw pt with me.  Grant Fontana, [place] 08/30/11 1601  Grant Fontana, [place] 08/30/11 [phone]

## 2011-08-30 NOTE — ED Provider Notes (Signed)
Atypical chronic pain syndrome almost 24 hours a day for the last 6 months or so in his chest neck back abdomen with partially reproducible tenderness to his entire back chest and upper abdomen without rebound unclear etiology but doubt ACS/PE or other EMC.  Medical screening examination/treatment/procedure(s) were conducted as a shared visit with non-physician practitioner(s) and myself.  I personally evaluated the patient during the encounter  Hurman Horn, MD 08/30/11 2106

## 2011-08-30 NOTE — Discharge Instructions (Signed)
Your studies here appear very reassuring today and are consistent with your previous lab values. It is also reassuring that you have had a negative cardiac cath recently. Please call your regular doctor's office for further evaluation and treatment. Return to the ED as needed.  Chest Pain (Nonspecific) It is often hard to give a specific diagnosis for the cause of chest pain. There is always a chance that your pain could be related to something serious, such as a heart attack or a blood clot in the lungs. You need to follow up with your caregiver for further evaluation. CAUSES   Heartburn.   Pneumonia or bronchitis.   Anxiety or stress.   Inflammation around your heart (pericarditis) or lung (pleuritis or pleurisy).   A blood clot in the lung.   A collapsed lung (pneumothorax). It can develop suddenly on its own (spontaneous pneumothorax) or from injury (trauma) to the chest.   Shingles infection (herpes zoster virus).  The chest wall is composed of bones, muscles, and cartilage. Any of these can be the source of the pain.  The bones can be bruised by injury.   The muscles or cartilage can be strained by coughing or overwork.   The cartilage can be affected by inflammation and become sore (costochondritis).  DIAGNOSIS  Lab tests or other studies, such as X-rays, electrocardiography, stress testing, or cardiac imaging, may be needed to find the cause of your pain.  TREATMENT   Treatment depends on what may be causing your chest pain. Treatment may include:   Acid blockers for heartburn.   Anti-inflammatory medicine.   Pain medicine for inflammatory conditions.   Antibiotics if an infection is present.   You may be advised to change lifestyle habits. This includes stopping smoking and avoiding alcohol, caffeine, and chocolate.   You may be advised to keep your head raised (elevated) when sleeping. This reduces the chance of acid going backward from your stomach into your  esophagus.   Most of the time, nonspecific chest pain will improve within 2 to 3 days with rest and mild pain medicine.  HOME CARE INSTRUCTIONS   If antibiotics were prescribed, take your antibiotics as directed. Finish them even if you start to feel better.   For the next few days, avoid physical activities that bring on chest pain. Continue physical activities as directed.   Do not smoke.   Avoid drinking alcohol.   Only take over-the-counter or prescription medicine for pain, discomfort, or fever as directed by your caregiver.   Follow your caregiver's suggestions for further testing if your chest pain does not go away.   Keep any follow-up appointments you made. If you do not go to an appointment, you could develop lasting (chronic) problems with pain. If there is any problem keeping an appointment, you must call to reschedule.  SEEK MEDICAL CARE IF:   You think you are having problems from the medicine you are taking. Read your medicine instructions carefully.   Your chest pain does not go away, even after treatment.   You develop a rash with blisters on your chest.  SEEK IMMEDIATE MEDICAL CARE IF:   You have increased chest pain or pain that spreads to your arm, neck, jaw, back, or abdomen.   You develop shortness of breath, an increasing cough, or you are coughing up blood.   You have severe back or abdominal pain, feel nauseous, or vomit.   You develop severe weakness, fainting, or chills.   You have a  fever.  THIS IS AN EMERGENCY. Do not wait to see if the pain will go away. Get medical help at once. Call your local emergency services (911 in [place] Do not drive yourself to the hospital. MAKE SURE YOU:   Understand these instructions.   Will watch your condition.   Will get help right away if you are not doing well or get worse.  Document Released: 12/17/2004 Document Revised: 02/26/2011 Document Reviewed: 10/13/2007 [facility] Patient Information 2012 [place],  [place].Pain of Unknown Etiology (Pain Without a Known Cause) You have come to your caregiver because of pain. Pain can occur in any part of the body. Often there is not a definite cause. If your laboratory (blood or urine) work was normal and x-rays or other studies were normal, your caregiver may treat you without knowing the cause of the pain. An example of this is the headache. Most headaches are diagnosed by taking a history. This means your caregiver asks you questions about your headaches. Your caregiver determines a treatment based on your answers. Usually testing done for headaches is normal. Often testing is not done unless there is no response to medications. Regardless of where your pain is located today, you can be given medications to make you comfortable. If no physical cause of pain can be found, most cases of pain will gradually leave as suddenly as they came.  If you have a painful condition and no reason can be found for the pain, It is importantthat you follow up with your caregiver. If the pain becomes worse or does not go away, it may be necessary to repeat tests and look further for a possible cause.  Only take over-the-counter or prescription medicines for pain, discomfort, or fever as directed by your caregiver.   For the protection of your privacy, test results can not be given over the phone. Make sure you receive the results of your test. Ask as to how these results are to be obtained if you have not been informed. It is your responsibility to obtain your test results.   You may continue all activities unless the activities cause more pain. When the pain lessens, it is important to gradually resume normal activities. Resume activities by beginning slowly and gradually increasing the intensity and duration of the activities or exercise. During periods of severe pain, bed-rest may be helpful. Lay or sit in any position that is comfortable.   Ice used for acute (sudden) conditions may  be effective. Use a large plastic bag filled with ice and wrapped in a towel. This may provide pain relief.   See your caregiver for continued problems. They can help or refer you for exercises or physical therapy if necessary.  If you were given medications for your condition, do not drive, operate machinery or power tools, or sign legal documents for 24 hours. Do not drink alcohol, take sleeping pills, or take other medications that may interfere with treatment. See your caregiver immediately if you have pain that is becoming worse and not relieved by medications. Document Released: 12/02/2000 Document Revised: 02/26/2011 Document Reviewed: 03/09/2005 [facility] Patient Information 2012 [place], [place].

## 2011-08-30 NOTE — ED Notes (Signed)
Pt states that he has begun having a "sharp and aching pain across chest" approx 5 minutes ago. Pt also reported dizziness and nausea.

## 2011-08-30 NOTE — ED Notes (Signed)
Pt reports numbness all over his body and chest pain onset yesterday. Pt also c/o shortness of breath and nausea.

## 2011-08-31 ENCOUNTER — Encounter: Payer: Self-pay | Admitting: Internal Medicine

## 2011-08-31 LAB — PROTIME-INR

## 2011-09-03 ENCOUNTER — Encounter (HOSPITAL_COMMUNITY): Payer: Self-pay

## 2011-09-03 ENCOUNTER — Encounter (HOSPITAL_COMMUNITY)
Admission: RE | Admit: 2011-09-03 | Discharge: 2011-09-03 | Disposition: A | Discharge status: Home / Self Care | Payer: Medicare Other | Source: Ambulatory Visit | Attending: Family Medicine | Admitting: Family Medicine

## 2011-09-03 DIAGNOSIS — N4289 Other specified disorders of prostate: Secondary | ICD-10-CM | POA: Insufficient documentation

## 2011-09-03 DIAGNOSIS — R222 Localized swelling, mass and lump, trunk: Secondary | ICD-10-CM

## 2011-09-03 DIAGNOSIS — R221 Localized swelling, mass and lump, neck: Secondary | ICD-10-CM | POA: Insufficient documentation

## 2011-09-03 DIAGNOSIS — R220 Localized swelling, mass and lump, head: Secondary | ICD-10-CM | POA: Insufficient documentation

## 2011-09-03 DIAGNOSIS — N200 Calculus of kidney: Secondary | ICD-10-CM | POA: Insufficient documentation

## 2011-09-03 DIAGNOSIS — R911 Solitary pulmonary nodule: Secondary | ICD-10-CM | POA: Insufficient documentation

## 2011-09-03 DIAGNOSIS — I7090 Unspecified atherosclerosis: Secondary | ICD-10-CM | POA: Insufficient documentation

## 2011-09-03 LAB — GLUCOSE, CAPILLARY: Glucose-Capillary: 99 mg/dL (ref 70–99)

## 2011-09-03 MED ORDER — FLUDEOXYGLUCOSE F - 18 (FDG) INJECTION
19.2000 | Freq: Once | INTRAVENOUS | Status: AC | PRN
  Administered 2011-09-03: 19.2 via INTRAVENOUS

## 2011-09-11 ENCOUNTER — Other Ambulatory Visit: Payer: Self-pay | Admitting: Cardiothoracic Surgery

## 2011-09-11 DIAGNOSIS — D381 Neoplasm of uncertain behavior of trachea, bronchus and lung: Secondary | ICD-10-CM

## 2011-09-14 ENCOUNTER — Ambulatory Visit (HOSPITAL_COMMUNITY)
Admission: RE | Admit: 2011-09-14 | Discharge: 2011-09-14 | Disposition: A | Discharge status: Home / Self Care | Payer: Medicare Other | Source: Ambulatory Visit | Attending: Cardiothoracic Surgery | Admitting: Cardiothoracic Surgery

## 2011-09-14 DIAGNOSIS — D381 Neoplasm of uncertain behavior of trachea, bronchus and lung: Secondary | ICD-10-CM

## 2011-09-14 DIAGNOSIS — J984 Other disorders of lung: Secondary | ICD-10-CM | POA: Insufficient documentation

## 2011-09-14 LAB — PULMONARY FUNCTION TEST

## 2011-09-14 MED ORDER — ALBUTEROL SULFATE (5 MG/ML) 0.5% IN NEBU
2.5000 mg | INHALATION_SOLUTION | Freq: Once | RESPIRATORY_TRACT | Status: AC
  Administered 2011-09-14: 2.5 mg via RESPIRATORY_TRACT

## 2011-09-15 ENCOUNTER — Encounter: Payer: Self-pay | Admitting: Cardiothoracic Surgery

## 2011-09-15 ENCOUNTER — Institutional Professional Consult (permissible substitution) (INDEPENDENT_AMBULATORY_CARE_PROVIDER_SITE_OTHER): Payer: Medicare Other | Admitting: Cardiothoracic Surgery

## 2011-09-15 VITALS — BP 126/72 | HR 96 | Resp 18 | Ht 71.0 in | Wt 161.0 lb

## 2011-09-15 DIAGNOSIS — R911 Solitary pulmonary nodule: Secondary | ICD-10-CM

## 2011-09-15 NOTE — Progress Notes (Addendum)
[address]            [place] [address]          [phone]      [name] [facility] Medical Record #[id number] Date of Birth: 01-Oct-1932  Referring: [name], * Primary Care: [name], MD  Chief Complaint:    Chief Complaint  Patient presents with  . Lung Lesion    Referral from Dr Jeannetta Nap for surgical eval on Lt upper lobe nodule, Chest CT 08/18/11    History of Present Illness:    Patient is a 78 year old male who for the pass 4-5 months has had complain of pleuritic-type chest pain involving his lower chest bilaterally. He also complains of some hoarseness that's been intermittent. He notes at least one admission and 4 emergency room visits for the same symptoms. Cardiac catheterization was then performed CT scan of the chest and PET scan is now referred to the thoracic service. He's had a history of pulmonary emboli in 2006 and has been on anticoagulation since that time. He is a long-term smoker more than 50 years and stopped 7 years ago he is currently using chewing tobacco.      Current Activity/ Functional Status: Patient is independent with mobility/ambulation, transfers, ADL's, IADL's.   Past Medical History  Diagnosis Date  . Hyperlipidemia   . Hypertension   . Esophageal reflux   . Diverticulosis   . Rheumatic fever ~ 1944  . Vision loss of left eye     S/P "cataract OR"; "can see a little; not much"  . Kidney stones   . Hard of hearing, left   . Pulmonary embolism     On Xarelto  . Hypercalcemia   . Arthritis     "knees"  . CAD (coronary artery disease)     nonobstructive by cath 07/22/11 with small to moderate sized diagonal branch with ostial stenosis , medical therapy advised  . Prostate cancer     s/p cryotherapy  . AV block     s/p PPM  . COPD (chronic obstructive pulmonary disease)   . Carotid artery aneurysm     Bilateral internal carotid artery aneurysms followed by Dr. Arbie Cookey      Past Surgical History  Procedure Date  . Cystoscopy   . Lung surgery 1957    "born w/bleb in [place]  . Insert / replace / remove pacemaker 1991    initial placement  . Insert / replace / remove pacemaker ~ 2011    "changed out"  . Tonsillectomy and adenoidectomy 1943  . Appendectomy 2000's  . Cholecystectomy 2000's  . Cataract extraction w/ intraocular lens implant ~ 2008    left eye  . Lithotripsy ~ 2011    "twice"  . Cardiac catheterization   . Prostate cryoablation ~ 2008    Family History  Problem Relation Age of Onset  . Hypertension Neg Hx   . Hyperlipidemia Neg Hx   . Heart failure Neg Hx   . Heart disease Neg Hx   . Colon cancer Neg Hx   . Breast cancer Mother   . Emphysema Sister     was a smoker    History   Social History  . Marital Status: Married    Spouse Name: N/A    Number of Children: 1  . Years of Education: N/A   Occupational History  .  Retired     [employer] architect    Social History Main Topics  . Smoking status: Former Smoker -- 1.0 packs/day for 50 years    Types: Cigarettes    Quit date: 03/23/2004  . Smokeless tobacco: Current User    Types: Chew  . Alcohol Use: No  . Drug Use: No  . Sexually Active: Not Currently   Other Topics Concern  . Not on file   Social History Narrative   2 caffeine drink daily     History  Smoking status  . Former Smoker -- 1.0 packs/day for 50 years  . Types: Cigarettes  . Quit date: 03/23/2004  Smokeless tobacco  . Current User  . Types: Chew    History  Alcohol Use No     Allergies  Allergen Reactions  . Iohexol      Desc: PT STATED TODAY HE GETS SLIGHT SOB FROM IV CONTRAST. HE NEEDS FULL PREMEDS FROM NOW ON PER DR. MATTERN   . Penicillins Itching and Rash    "haven't took none in 40 years"    Current Outpatient Prescriptions  Medication Sig Dispense Refill  . albuterol (PROVENTIL,VENTOLIN) 90 MCG/ACT inhaler Inhale 2 puffs into the lungs every 6 (six) hours as needed. For wheezing  or shortness of breath      . DULoxetine (CYMBALTA) 30 MG capsule Take 60 mg by mouth daily.       Marland Kitchen losartan (COZAAR) 50 MG tablet Take 50 mg by mouth daily.      . metoprolol succinate (TOPROL XL) 25 MG 24 hr tablet Take 1 tablet (25 mg total) by mouth daily.  30 tablet  6  . Multiple Vitamin (MULTIVITAMIN) tablet Take 1 tablet by mouth daily.      . nitroGLYCERIN (NITROSTAT) 0.4 MG SL tablet Place 0.4 mg under the tongue every 5 (five) minutes as needed. For chest pain      . omeprazole (PRILOSEC) 20 MG capsule Take 20 mg by mouth daily.      . Rivaroxaban (XARELTO) 20 MG TABS Take 20 mg by mouth daily.  30 tablet  6  . SPIRIVA HANDIHALER 18 MCG inhalation capsule Place 18 mcg into inhaler and inhale daily.       Marland Kitchen DISCONTD: albuterol (PROVENTIL,VENTOLIN) 90 MCG/ACT inhaler Inhale 2 puffs into the lungs every 6 (six) hours as needed for wheezing or shortness of breath.  17 g  3       Review of Systems:     Cardiac Review of Systems: Y or N  Chest Pain [ y   ]  Resting SOB [ n  ] Exertional SOB  [ n ]  Orthopnea [  n]   Pedal Edema [ n  ]    Palpitations [n  ] Syncope  [n  ]   Presyncope [  n ]  General Review of Systems: [Y] = yes [  ]=no Constitional: recent weight change [  ]; anorexia [  ]; fatigue [  ]; nausea [  ]; night sweats [  ]; fever [  ]; or chills [  ];  Dental: poor dentitionn[  ];   Eye : blurred vision [  ]; diplopia [   ]; vision changes [  ];  Amaurosis fugax[  ]; Resp: cough [  ];  wheezing[  ];  hemoptysis[  ]; shortness of breath[  ]; paroxysmal nocturnal dyspnea[y  ]; dyspnea on exertion[ y ]; or orthopnea[  ];  GI:  gallstones[  ], vomiting[  ];  dysphagia[  ]; melena[  ];  hematochezia [  ]; heartburn[  ];   Hx of  Colonoscopy[ y 3 years ago ]; GU: kidney stones [  ]; hematuria[ n ];   dysuria [  ];  nocturia[  ];  history of      obstruction [  ];             Skin: rash, swelling[  ];, hair loss[  ];  peripheral edema[  ];  or itching[  ]; Musculosketetal: myalgias[y  ];  joint swelling[ n ];  joint erythema[n  ];  joint pain[  ];  back pain[y  ];  Heme/Lymph: bruising[ y ];  bleeding[  ];  anemia[  ];  Neuro: TIA[  ];  headaches[  ];  stroke[  ];  vertigo[  ];  seizures[  ];   paresthesias[  ];  difficulty walking[n  ];  Psych:depression[  ]; anxiety[  ];  Endocrine: diabetes[  ];  thyroid dysfunction[  ];  Immunizations: Flu [ y ]; Pneumococcal[y  ];  Other:  Physical Exam: BP 126/72  Pulse 96  Resp 18  Ht 5\' 11"  (1.803 m)  Wt 161 lb (73.029 kg)  BMI 22.45 kg/m2  SpO2 96%  General appearance: alert, cooperative, appears stated age and no distress Neurologic: intact Heart: regular rate and rhythm, S1, S2 normal, no murmur, click, rub or gallop and normal apical impulse Lungs: clear to auscultation bilaterally and normal percussion bilaterally Abdomen: soft, non-tender; bowel sounds normal; no masses,  no organomegaly Extremities: extremities normal, atraumatic, no cyanosis or edema and Homans sign is negative, no sign of DVT Wound: He has a formal left thoracotomy incision well healed I do not appreciate any cervical or supraclavicular adenopathy   Diagnostic Studies & Laboratory data:     Recent Radiology Findings:  Dg Chest 2 View  08/30/2011  *RADIOLOGY REPORT*  Clinical Data: Shortness of breath, numbness  CHEST - 2 VIEW  Comparison: 06/14/2011 and CT scan 08/18/2011  Findings: Cardiomediastinal silhouette is stable.  Dual lead cardiac pacemaker is unchanged in position.  Hyperinflation again noted. No acute infiltrate or pulmonary edema.  Stable osteopenia and degenerative changes thoracic spine.  Again noted 1.3 cm nodule in the left upper lobe.  Again further evaluation with PET scan is recommended to exclude malignancy.  IMPRESSION:  Hyperinflation again noted. No acute infiltrate or pulmonary edema.   Stable osteopenia and degenerative changes thoracic spine. Again noted 1.3 cm nodule in the left upper lobe.  Again further evaluation with PET scan is recommended to exclude malignancy.  Original Report Authenticated By: Natasha Mead, M.D.   Ct Angio Neck W/cm &/or Wo/cm  08/25/2011  *RADIOLOGY REPORT*  Clinical Data:  78 year old male with history of skull base internal carotid artery pseudoaneurysms.  History of lung cancer, pacemaker, prostate cancer, contrast allergy.  CT ANGIOGRAPHY NECK  Technique:  Multidetector CT imaging of the neck was performed using the standard protocol during bolus administration of intravenous contrast.  Multiplanar CT image reconstructions including MIPs were obtained to evaluate the vascular anatomy. Carotid stenosis  measurements (when applicable) are obtained utilizing NASCET criteria, using the distal internal carotid diameter as the denominator.  Contrast: OMNIPAQUE IOHEXOL 350 MG/ML SOLN 13 hours steroid premedication done with no apparent adverse reaction at the time of imaging.  Comparison:  08/16/2009, 10/03/2008.  Chest CT 08/18/2011.  Findings:  Right chest cardiac pacemaker again noted.  Left posterior upper lobe pleural-based spiculated lesion as described on the recent chest CT.  Underlying emphysema and apical scarring.  No superior mediastinal lymphadenopathy.  Stable sub centimeter right thyroid nodules, inconsequential.  Negative larynx.  Stable pharynx.  Negative parapharyngeal, retropharyngeal and sublingual spaces.  Negative submandibular glands.  Stable parotid glands.  No cervical lymphadenopathy.  Advanced left cervical spine facet degeneration. Visualized paranasal sinuses and mastoids are clear. No acute osseous abnormality identified.  Grossly negative visualized brain parenchyma.  Vascular Findings: Three-vessel arch configuration with no arch atherosclerosis.  No great vessel origin stenosis.  Normal right common carotid artery is stable.  Right  carotid bifurcation is stable widely patent with mild soft plaque. Tortuous distal cervical right ICA with a mostly fusiform type aneurysm is stable since 2011 measuring 8 x 8 x 9 mm (series 5 image 105, series 4082 image 69).  No stenosis.  Negative visualized right ICA siphon.  Mildly nondominant right vertebral artery has a normal origin. Right paravertebral venous contrast reflux noted similar to the prior exam.  Negative cervical right vertebral artery.  Pain right PICA.  Mildly dominant left vertebral artery has a normal origin.  Normal cervical left vertebral artery.  The distal left vertebral artery primarily supplies the basilar.  Stable and normal left common carotid artery and left carotid bifurcation except for mild soft and faintly calcified atherosclerotic plaque.  Tortuous cervical left ICA with a rim calcified mostly thrombosed pseudoaneurysm arising medially from the left ICA at the level of the C2 vertebral body (series 401 image 120, series 5 image 100.  This is stable with this lesion measuring about 11 mm in length.  Ectasia of the adjacent left ICA. More distal tortuosity and then just below the skull base there is a second partially calcified saccular pseudoaneurysm with persistent enhancement and a broad neck.  This is stable with the enhancing portion measuring 8 x 10 x 11 mm.  Thrombosis within the pseudoaneurysm continues to the foramen lacerum.  The visualized left ICA siphons otherwise negative.   Review of the MIP images confirms the above findings.  IMPRESSION: 1.  Stable bilateral cervical ICA pseudoaneurysms since 2011 as detailed above. 2.  Mild carotid bifurcation atherosclerosis.  No atherosclerotic stenosis in the neck. 3.  Emphysema.  The left apical pleural based lung lesion, see chest CT report 08/18/2011.  Original Report Authenticated By: Harley Hallmark, M.D.   Ct Chest Wo Contrast  08/18/2011  *RADIOLOGY REPORT*  Clinical Data: Bilateral rib pain and soreness for 2  months, history of prostate carcinoma, former smoking history  CT CHEST WITHOUT CONTRAST  Technique:  Multidetector CT imaging of the chest was performed following the standard protocol without IV contrast.  Comparison: Chest x-ray of 06/14/2011  Findings: Severe centrilobular emphysema is noted particularly involving the upper lobes.  Within the left upper lobe, there is a pleural based opacity posteriorly of 1.3 x 1.1 x 1.8 cm.  PET-CT is recommended to assess for metabolic activity versus close follow-up CT in 3-4 months to assess stability.  No other suspicious pulmonary nodule or mass is seen.  No pleural effusion is noted. On bone window  images no abnormality of the ribs is seen.  On soft tissue window images, probable small thyroid nodules are present bilaterally.  No mediastinal or hilar adenopathy is seen. The ascending aorta measures 4.3 cm in diameter.  There is cardiomegaly present.  A permanent pacemaker is noted.  An exophytic lesion emanates from the upper pole of the right kidney medially measuring 1.6 x 1.9 cm with an attenuation of 14 HU most consistent with complex cyst.  Several other more simple appearing right renal cysts are noted as well.  An exophytic structure in the mid left kidney also is most consistent with cyst.  IMPRESSION:  1.  Pleural based nodular lesion in the posterior left upper hemithorax of 1.3 x 1.1 x 1.8 cm.  Consider PET CT for more acute workup versus close follow-up CT of the chest in 3-4 months. 2.  Centrilobular emphysema. 3.  No abnormality of the ribs is seen. 4.  Fusiform dilatation of the ascending aorta with maximum diameter of 4.3 cm.  Original Report Authenticated By: Juline Patch, M.D.   Nm Pet Image Initial (pi) Skull Base To Thigh  09/03/2011  *RADIOLOGY REPORT*  Clinical Data: Initial treatment strategy for lung nodule.  NUCLEAR MEDICINE PET SKULL BASE TO THIGH  Fasting Blood Glucose:  99  Technique:  19.2 mCi F-18 FDG was injected intravenously. CT data  was obtained and used for attenuation correction and anatomic localization only.  (This was not acquired as a diagnostic CT examination.) Additional exam technical data entered on technologist worksheet.  Comparison:  CT chest 08/18/2011 and CT neck 08/25/2011.  Findings:  Neck: Focal hypermetabolism is seen in the left parotid, with an S U V max of 5.3.  There is a 7 mm nodule within the left parotid gland, image 29.  No additional areas of abnormal hypermetabolism in the neck.  CT images show no acute findings.  Chest:  An irregular subpleural left upper lobe nodule measures 1.2 x 1.3 cm and has an S U V max of 2.2.  No additional areas of abnormal hypermetabolism in the chest.  CT images show no acute findings.  A 3.9 x 5.6 cm low attenuation lesion in the subcutaneous tissues of the upper right paraspinal region is likely a sebaceous cyst.  No pericardial or pleural effusion. Emphysema.  Abdomen/Pelvis:  No abnormal hypermetabolic activity within the liver, pancreas, adrenal glands, or spleen.  No hypermetabolic lymph nodes in the abdomen or pelvis.  CT images show no acute findings.  A stone is seen in the lower pole left kidney.  Atherosclerotic calcification of the arterial vasculature without abdominal aortic aneurysm.  Bladder wall appears thickened but the bladder is under distended. Calcifications are seen in the prostate.  [place]:  No focal hypermetabolic activity to suggest skeletal metastasis.  IMPRESSION:  1.  Irregular subpleural left upper lobe nodule is mildly hypermetabolic and therefore worrisome for a low grade adenocarcinoma.  If this is a non-small cell lung cancer, it is most consistent with a stage IA lesion. Consultation with the [facility] [phone]) should be considered. 2.  Small nodule the left parotid gland with associated hypermetabolism.  Finding is nonspecific. 3.  Left renal stone.  Original Report Authenticated By: Reyes Ivan, M.D.      Recent Lab Findings: Lab Results  Component Value Date   WBC 7.3 08/30/2011   HGB 16.6 08/30/2011   HCT 47.1 08/30/2011   PLT 275 08/30/2011   GLUCOSE 108* 08/30/2011  CHOL 192 07/22/2011   TRIG 76 07/22/2011   HDL 57 07/22/2011   LDLCALC 161* 07/22/2011   ALT 16 07/21/2011   AST 21 07/21/2011   NA 140 08/30/2011   K 4.9 08/30/2011   CL 102 08/30/2011   CREATININE 0.97 08/30/2011   BUN 10 08/30/2011   CO2 26 08/30/2011   TSH 4.001 06/13/2011   INR 1.18 07/21/2011   HGBA1C 5.0 06/13/2011   PFTS done FEV1 2.19 72% with 10 & improvement with bronchodilators, DLCO 80 % 0n 09/14/2011   Assessment / Plan:    Patient is a 78 year old male with unexplained bilateral lower chest pain also with some hoarseness who has had extensive evaluation and multiple emergency room visits. A CT scan and PET scan show a 1.3 cm borderline hypermetabolic lesion in the left upper lobe of the lung. There is also a hypermetabolic area in the left parotid gland there is no evidence of involvement of bone.   I recommended to the patient that we proceed with needle biopsy of the left lung lesion, he's had a previous thoracotomy so ideally knowing that he has a definite lung cancer would help in operative approach. I've asked him to see Dr. [name] again who saw him for hoarseness several months ago but before he had a PET scan positive in the left neck.  The patient is currently on anticoagulation this will need to be stopped prior to the needle biopsy and could be resumed afterwards.  Plan to see him back after the needle biopsy is completed to review the results, and decide on whether to proceed with thoracotomy.      [name] MD  Beeper [phone] Office [phone] 09/15/2011 5:49 PM

## 2011-09-15 NOTE — Patient Instructions (Signed)
Pulmonary Nodule A pulmonary (lung) nodule is small, round growth in the lung. The size of a pulmonary nodule can be as small as a pencil eraser (1/5 inch or 4 millmeters) to a little bigger than your biggest toenail (1 inch or 25 millimeters). A pulmonary nodule is usually an unplanned finding. It may be found on a chest X-ray or a computed tomography (CT) scan when you have imaging tests of your lungs done. When a pulmonary nodule is found, tests will be done to determine if the nodule is benign (not cancerous) or malignant (cancerous). Follow-up treatment or testing is based on the size of the pulmonary nodule and your risk of getting lung cancer.  CAUSES Causes of pulmonary nodules can vary.  Benign pulmonary nodules  can be caused from different things. Some of these things include:  Infection. This can be a common cause of a benign pulmonary nodule. The infection may be active (a current infection) or an old infection that is no longer active. Three types of infections can cause a pulmonary nodule. These are:   Bacterial Infection.   Fungal infection.   Viral Infections.   Hematoma. This is a bruise in the lung. A hematoma can happen from an injury to your chest.   Some common diseases can lead to benign pulmonary nodules. For example, rheumatoid arthritis can be a cause of a pulmonary nodule.   Other unusual things can cause a benign pulmonary nodule. These can include:   Having had tuberculosis.   Rare diseases, such as a lung cyst.  Malignant pulmonary nodules.  These are cancerous growths. The cancer may have:  Started in the lung. Some lung cancers first detected as a pulmonary nodule.   Spread to the lung from cancer somewhere else in the body. This is called metastatic cancer.   Certain risk factors make a cancerous pulmonary nodule more likely. They include:   Age. As people get older, a pulmonary nodule is more likely to be cancerous.   Cancer history. If one of your  immediate family members has had cancer, you have a higher risk of developing cancer.   Smoking. This includes people who currently smoke and those who have quit.  DIAGNOSIS To diagnose whether a pulmonary nodule is benign or malignant, a variety of tests will be done. This includes things such as:  Health history. Questions regarding your current health, past health, and family health will be asked.   Blood tests. Results of blood work can show:   Tumor markers for cancer.   Any type of infection.   A skin test called a tuberculin (TB) test may be done. This test can tell if you have been exposed to the germ that causes tuberculosis.   Imaging tests. These take pictures of your lungs. Types of imaging tests include:   Chest X-ray. This can help in several ways. An X-ray gives a close-up look at the pulmonary nodule. A new X-ray can be compared with any X-rays you have had in the past.    Computed tomography  (CT) scan. This test shows smaller pulmonary nodules more clearly than an X-ray.   Positron emission tomography  (PET) scan. This is a test that uses a radioactive substance to identify a pulmonary nodule. A safe amount of radioactive substance is injected into the blood stream. Then, the scan takes a picture of the pulmonary nodule. A malignant pulmonary nodule will absorb the substance faster than a benign pulmonary nodule. The radioactive substance is eliminated  from your body in your urine.   Biopsy.  This removes a tiny piece of the pulmonary nodule so it can be checked under a microscope. Medicine will be given to help keep you relaxed and pain free when a biopsy is done. Types of biopsies include:   Bronchoscopy . This is a surgical procedure. It can be used for pulmonary nodules that are close to the airways in the lung. It uses a scope (a thin tube) with a tiny camera and light on the end. The scope is put in the windpipe. Your caregiver can then see inside the lung. A tiny  tool put through the scope is used to take a small sample of the pulmonary nodule tissue.   Transthoracic needle aspiration . This method is used if the pulmonary nodule is far away from the air passages in the lung. A long, thin needle is put through the chest into the lung nodule. A CT scan is done at the same time which can make it easier to locate the pulmonary nodule.   Surgical lung biopsy . This is a surgical procedure in which the pulmonary nodule is removed. This is usually recommended when the pulmonary nodule is most likely malignant or a biopsy cannot be obtained by either bronchoscopy or transthoracic needle aspiration.  PULMONARY NODULE FOLLOW-UP RECOMMENDATIONS The frequency of pulmonary nodule follow-up is based on your risk factors and size of the pulmonary nodule. If your caregiver suspects the pulmonary nodule is cancerous or the pulmonary nodule changes during any of the follow-up CT scans, additional testing or biopsies will be done.   If you have no or low risk of getting lung cancer (non-smoker, no personal cancer history), recommended follow-up is based on the following pulmonary nodule size:   A pulmonary nodule that is < 4 mm does not require any follow-up.   A pulmonary nodule that is 4 to 6 mm should be re-imaged by CT scan in 12 months.   A pulmonary nodule that is 6 to 8 mm should be re-imaged by CT scan at 6 to 12 months and then again at 18 to 24 months if no change in size.   A pulmonary nodule > 8 mm in size should be followed closely and re-imaged by CT scan at 3, 9, and 24 months.    If you are at risk of getting lung cancer (current or former smoker, family history of cancer), recommended follow-up is based on the following pulmonary nodule size:   A pulmonary nodule that is < 4 mm in size should be re-imaged by CT scan in 12 months.   A pulmonary nodule that is 4 to 6 mm in size should be re-imaged by CT scan at 6 to 12 months and again at 18 to 24  months.   A pulmonary nodule that is 6 to 8 mm in size should be re-imaged by CT scan at 3, 9, and 24 months.   A pulmonary nodule > 8 mm in size should be followed closely and re-imaged by CT scan at 3, 9, and 24 months.  SEEK MEDICAL CARE IF: While waiting for test results to determine what type of pulmonary nodule you have, be sure to contact your caregiver if you:  Have trouble breathing when you are active.   Feel sick or unusually tired.   Do not feel like eating.   Lose weight without trying to.   Develop chills or night sweats.   Mild or moderate fevers generally  have no long-term effects and often do not require treatment. There are a few exceptions (see below).  SEEK IMMEDIATE MEDICAL CARE IF:  You cannot catch your breath or you begin wheezing.   You cannot stop coughing.   You cough up blood.   You feel like you are going to pass out or become dizzy.   You have sudden chest pain.   You have a fever or persistent symptoms for more than 72 hours.   You have a fever and your symptoms suddenly get worse.  MAKE SURE YOU   Understand these instructions.   Will watch your condition.   Will get help right away if you are not doing well or get worse.  Document Released: 01/04/2009 Document Revised: 02/26/2011 Document Reviewed: 01/04/2009 [facility] Patient Information 2012 [place], [place].

## 2011-09-16 ENCOUNTER — Encounter: Payer: Self-pay | Admitting: Gastroenterology

## 2011-09-16 ENCOUNTER — Other Ambulatory Visit: Payer: Self-pay | Admitting: Cardiothoracic Surgery

## 2011-09-16 DIAGNOSIS — D381 Neoplasm of uncertain behavior of trachea, bronchus and lung: Secondary | ICD-10-CM

## 2011-09-17 ENCOUNTER — Other Ambulatory Visit: Payer: Self-pay | Admitting: Radiology

## 2011-09-18 ENCOUNTER — Encounter (HOSPITAL_COMMUNITY): Payer: Self-pay | Admitting: Pharmacy Technician

## 2011-09-21 ENCOUNTER — Ambulatory Visit (HOSPITAL_COMMUNITY)
Admission: RE | Admit: 2011-09-21 | Discharge: 2011-09-21 | Disposition: A | Discharge status: Home / Self Care | Payer: Medicare Other | Source: Ambulatory Visit | Attending: Interventional Radiology | Admitting: Interventional Radiology

## 2011-09-21 ENCOUNTER — Ambulatory Visit (HOSPITAL_COMMUNITY)
Admission: RE | Admit: 2011-09-21 | Discharge: 2011-09-21 | Disposition: A | Discharge status: Home / Self Care | Payer: Medicare Other | Source: Ambulatory Visit | Attending: Cardiothoracic Surgery | Admitting: Cardiothoracic Surgery

## 2011-09-21 ENCOUNTER — Encounter (HOSPITAL_COMMUNITY): Payer: Self-pay

## 2011-09-21 ENCOUNTER — Ambulatory Visit (HOSPITAL_COMMUNITY): Admission: RE | Admit: 2011-09-21 | Payer: Medicare Other | Source: Ambulatory Visit

## 2011-09-21 DIAGNOSIS — E785 Hyperlipidemia, unspecified: Secondary | ICD-10-CM | POA: Insufficient documentation

## 2011-09-21 DIAGNOSIS — C3430 Malignant neoplasm of lower lobe, unspecified bronchus or lung: Secondary | ICD-10-CM | POA: Insufficient documentation

## 2011-09-21 DIAGNOSIS — D381 Neoplasm of uncertain behavior of trachea, bronchus and lung: Secondary | ICD-10-CM

## 2011-09-21 DIAGNOSIS — Y849 Medical procedure, unspecified as the cause of abnormal reaction of the patient, or of later complication, without mention of misadventure at the time of the procedure: Secondary | ICD-10-CM | POA: Insufficient documentation

## 2011-09-21 DIAGNOSIS — J95811 Postprocedural pneumothorax: Secondary | ICD-10-CM | POA: Insufficient documentation

## 2011-09-21 DIAGNOSIS — Z8546 Personal history of malignant neoplasm of prostate: Secondary | ICD-10-CM | POA: Insufficient documentation

## 2011-09-21 DIAGNOSIS — I10 Essential (primary) hypertension: Secondary | ICD-10-CM | POA: Insufficient documentation

## 2011-09-21 DIAGNOSIS — Y9210 Unspecified residential institution as the place of occurrence of the external cause: Secondary | ICD-10-CM | POA: Insufficient documentation

## 2011-09-21 LAB — CBC
Hemoglobin: 15.4 g/dL (ref 13.0–17.0)
MCH: 30.9 pg (ref 26.0–34.0)
MCHC: 34.4 g/dL (ref 30.0–36.0)
MCV: 89.8 fL (ref 78.0–100.0)
Platelets: 216 10*3/uL (ref 150–400)
RBC: 4.99 MIL/uL (ref 4.22–5.81)

## 2011-09-21 MED ORDER — FENTANYL CITRATE 0.05 MG/ML IJ SOLN
INTRAMUSCULAR | Status: AC | PRN
  Administered 2011-09-21: 50 ug via INTRAVENOUS

## 2011-09-21 MED ORDER — MIDAZOLAM HCL 2 MG/2ML IJ SOLN
INTRAMUSCULAR | Status: AC
  Filled 2011-09-21: qty 4

## 2011-09-21 MED ORDER — FENTANYL CITRATE 0.05 MG/ML IJ SOLN
INTRAMUSCULAR | Status: AC
  Filled 2011-09-21: qty 4

## 2011-09-21 MED ORDER — MIDAZOLAM HCL 5 MG/5ML IJ SOLN
INTRAMUSCULAR | Status: AC | PRN
  Administered 2011-09-21: 1 mg via INTRAVENOUS

## 2011-09-21 MED ORDER — SODIUM CHLORIDE 0.9 % IV SOLN
Freq: Once | INTRAVENOUS | Status: AC
  Administered 2011-09-21: 07:00:00 via INTRAVENOUS

## 2011-09-21 NOTE — ED Notes (Signed)
Requested SS-C Bed 

## 2011-09-21 NOTE — H&P (Signed)
[name] is an 78 y.o. male.   Chief Complaint: left lung mass discovered after complaint of chest pain. CT and PET show left lung mass Scheduled now for biopsy  HPI: HTN; Hx PE (Xarelto- off 5 days); prostate ca; COPD; remote smoker  Past Medical History  Diagnosis Date  . Hyperlipidemia   . Hypertension   . Esophageal reflux   . Diverticulosis   . Rheumatic fever ~ 1944  . Vision loss of left eye     S/P "cataract OR"; "can see a little; not much"  . Kidney stones   . Hard of hearing, left   . Pulmonary embolism     On Xarelto  . Hypercalcemia   . Arthritis     "knees"  . CAD (coronary artery disease)     nonobstructive by cath 07/22/11 with small to moderate sized diagonal branch with ostial stenosis , medical therapy advised  . Prostate cancer     s/p cryotherapy  . AV block     s/p PPM  . COPD (chronic obstructive pulmonary disease)   . Carotid artery aneurysm     Bilateral internal carotid artery aneurysms followed by Dr. Arbie Cookey    Past Surgical History  Procedure Date  . Cystoscopy   . Lung surgery 1957    "born w/bleb in [facility]  . Insert / replace / remove pacemaker 1991    initial placement  . Insert / replace / remove pacemaker ~ 2011    "changed out"  . Tonsillectomy and adenoidectomy 1943  . Appendectomy 2000's  . Cholecystectomy 2000's  . Cataract extraction w/ intraocular lens implant ~ 2008    left eye  . Lithotripsy ~ 2011    "twice"  . Cardiac catheterization   . Prostate cryoablation ~ 2008    Family History  Problem Relation Age of Onset  . Hypertension Neg Hx   . Hyperlipidemia Neg Hx   . Heart failure Neg Hx   . Heart disease Neg Hx   . Colon cancer Neg Hx   . Breast cancer Mother   . Emphysema Sister     was a smoker   Social History:  reports that he quit smoking about 7 years ago. His smoking use included Cigarettes. He has a 50 pack-year smoking history. His smokeless tobacco use includes Chew. He reports that he does not drink  alcohol or use illicit drugs.  Allergies:  Allergies  Allergen Reactions  . Iohexol      Desc: PT STATED TODAY HE GETS SLIGHT SOB FROM IV CONTRAST. HE NEEDS FULL PREMEDS FROM NOW ON PER DR. MATTERN   . Penicillins Itching and Rash    "haven't took none in 40 years"     (Not in a hospital admission)  Results for orders placed during the hospital encounter of 09/21/11 (from the past 48 hour(s))  APTT     Status: Normal   Collection Time   09/21/11  7:11 AM      Component Value Range Comment   aPTT 32  24 - 37 seconds   CBC     Status: Normal   Collection Time   09/21/11  7:11 AM      Component Value Range Comment   WBC 6.5  4.0 - 10.5 K/uL    RBC 4.99  4.22 - 5.81 MIL/uL    Hemoglobin 15.4  13.0 - 17.0 g/dL    HCT 16.1  09.6 - 04.5 %    MCV 89.8  78.0 -  100.0 fL    MCH 30.9  26.0 - 34.0 pg    MCHC 34.4  30.0 - 36.0 g/dL    RDW 46.9  62.9 - 52.8 %    Platelets 216  150 - 400 K/uL   PROTIME-INR     Status: Normal   Collection Time   09/21/11  7:11 AM      Component Value Range Comment   Prothrombin Time 13.7  11.6 - 15.2 seconds    INR 1.03  0.00 - 1.49    No results found.  Review of Systems  Constitutional: Negative for fever.  Respiratory: Positive for cough. Negative for shortness of breath.   Cardiovascular: Positive for chest pain.  Gastrointestinal: Negative for nausea and vomiting.  Neurological: Negative for headaches.    Blood pressure 144/77, pulse 63, temperature 97.1 F (36.2 C), temperature source Oral, resp. rate 18, height 5\' 11"  (1.803 m), weight 161 lb (73.029 kg), SpO2 98.00%. Physical Exam  Constitutional: He is oriented to person, place, and time. He appears well-developed.  Cardiovascular: Normal rate, regular rhythm and normal heart sounds.   No murmur heard. Respiratory: Effort normal and breath sounds normal. He has no wheezes.  GI: Soft. Bowel sounds are normal. There is no tenderness.  Musculoskeletal: Normal range of motion.  Neurological:  He is alert and oriented to person, place, and time.  Skin: Skin is warm and dry.  Psychiatric: He has a normal mood and affect. His behavior is normal. Judgment and thought content normal.     Assessment/Plan Left lung mass Scheduled for bx today in IR Pt aware of procedure benefits and risks and agreeable to proceed. Consent in chart  Netasha Wehrli A 09/21/2011, 8:00 AM

## 2011-09-21 NOTE — Discharge Instructions (Signed)
Needle Biopsy of Lung °Care After °A needle biopsy is a procedure to get a sample of cells from your body for testing. A needle biopsy may be used to take tissue or fluid samples from muscles, bones and organs, such as the liver or lungs. °The sample from your needle biopsy may help your doctor determine what is causing: °· A mass or lump. A needle biopsy may reveal whether a mass or lump is a cyst, an infection, a benign tumor or cancer.  °· Infection. Tests from a needle biopsy can help doctors determine what germs are causing an infection so that the best medicines can be used for treatment.  °· Inflammation. Looking closely at a needle biopsy sample may reveal what is causing inflammation and what types of cells are involved.  °Your caregiver has now completed a needle biopsy of the lung to help diagnose a medical condition or to rule out a disease or condition. A needle biopsy may also be used to assess the progress of a treatment. °AFTER THE PROCEDURE °Once your caregiver has collected enough cells or tissue for analysis, your needle biopsy procedure is complete. Your biopsy sample is sent to a laboratory to be tested. The results may be available in a day or two. More technical tests may require more time. Ask your caregiver how Kecia Swoboda you can expect to wait.  °Your health care team may apply a bandage over the areas where the needle was inserted. You may be asked to apply pressure to the bandage for several minutes to ensure there is minimal bleeding. °In most cases, you can leave when your needle biopsy procedure is completed. Do not drive yourself home. Someone else should take you home. Whether you can leave right away or whether you will need to stay for observation depends on how you feel and the exam by your caregiver after the biopsy. In some cases your health care team may want to observe you for a few hours to ensure you do not have complications from your biopsy. If you received an IV sedative or  general anesthetic, you will be taken to a comfortable place to relax while the medication wears off. °Expect to take it easy for the rest of the day. Protect the area where you received the needle biopsy by keeping the bandage in place for as Malary Aylesworth as instructed. You may feel some mild pain or discomfort in the area, but this should stop in a day or two. Only take over-the-counter or prescription medicines for pain, discomfort, or fever as directed by your caregiver. °SEEK MEDICAL CARE IF:  °· You have pain at the biopsy site that worsens or is not helped by medicines.  °· You have swelling at the needle biopsy site.  °· You have drainage from the biopsy site.  °· You have new or unusual pain in your back or at the top of one or both shoulders.  °SEEK IMMEDIATE MEDICAL CARE IF:  °· You have a fever.  °· You develop lightheadedness or fainting.  °· You have chest pain or shortness of breath.  °· You have bleeding that does not stop with pressure or a bandage.  °· You have weakness or numbness in your legs.  °Document Released: 01/04/2007 Document Revised: 02/26/2011 Document Reviewed: 01/04/2007 °[employer]® Patient Information ©2012 [employer]. °

## 2011-09-21 NOTE — Procedures (Signed)
Successful [facility] superior seg nodule FNA X 3 NO COMP Stable Full report in PACS

## 2011-09-25 ENCOUNTER — Other Ambulatory Visit: Payer: Self-pay

## 2011-09-25 ENCOUNTER — Encounter: Payer: Self-pay | Admitting: Cardiothoracic Surgery

## 2011-09-25 ENCOUNTER — Ambulatory Visit (INDEPENDENT_AMBULATORY_CARE_PROVIDER_SITE_OTHER): Payer: Medicare Other | Admitting: Cardiothoracic Surgery

## 2011-09-25 VITALS — BP 163/90 | HR 91 | Resp 16 | Ht 71.0 in | Wt 161.0 lb

## 2011-09-25 DIAGNOSIS — D381 Neoplasm of uncertain behavior of trachea, bronchus and lung: Secondary | ICD-10-CM

## 2011-09-25 DIAGNOSIS — J984 Other disorders of lung: Secondary | ICD-10-CM

## 2011-09-25 DIAGNOSIS — R911 Solitary pulmonary nodule: Secondary | ICD-10-CM

## 2011-09-25 NOTE — Progress Notes (Signed)
[address]            [place] [address]          [phone]         [name] [facility] Medical Record #[id number] Date of Birth: May 14, 1932  Referring: [name], * Primary Care: [name], MD  Chief Complaint:    Chief Complaint  Patient presents with  . Lung Lesion    f/u after needle biopsy...Marland KitchenMarland Kitchen7/1/13    History of Present Illness:    Patient is a 78 year old male who for the pass 4-5 months has had complain of pleuritic-type chest pain involving his lower chest bilaterally. He also complains of some hoarseness that's been intermittent. He notes at least one admission and 4 emergency room visits for the same symptoms. Cardiac catheterization was then performed CT scan of the chest and PET scan is now referred to the thoracic service. He's had a history of pulmonary emboli in 2006 and has been on anticoagulation since that time. He is a long-term smoker more than 50 years and stopped 7 years ago he is currently using chewing tobacco.   Since the patient was seen last week a his anticoagulation has been held and a needle biopsy of the left upper lobe lung lesion was biopsied. Limited tissue was obtained but it does confirm non-small cell cancer, probably of lung origin. In addition the patient has seen Dr. [name] concerning the hypermetabolic area in the left parotid gland, this was not felt to be of clinical significance currently.   Current Activity/ Functional Status: Patient is independent with mobility/ambulation, transfers, ADL's, IADL's.   Past Medical History  Diagnosis Date  . Hyperlipidemia   . Hypertension   . Esophageal reflux   . Diverticulosis   . Rheumatic fever ~ 1944  . Vision loss of left eye     S/P "cataract OR"; "can see a little; not much"  . Kidney stones   . Hard of hearing, left   . Pulmonary embolism     On Xarelto  . Hypercalcemia   . Arthritis     "knees"  .  CAD (coronary artery disease)     nonobstructive by cath 07/22/11 with small to moderate sized diagonal branch with ostial stenosis , medical therapy advised  . Prostate cancer     s/p cryotherapy  . AV block     s/p PPM  . COPD (chronic obstructive pulmonary disease)   . Carotid artery aneurysm     Bilateral internal carotid artery aneurysms followed by Dr. Arbie Cookey    Past Surgical History  Procedure Date  . Cystoscopy   . Lung surgery 1957    "born w/bleb in [place]  . Insert / replace / remove pacemaker 1991    initial placement  . Insert / replace / remove pacemaker ~ 2011    "changed out"  . Tonsillectomy and adenoidectomy 1943  . Appendectomy 2000's  . Cholecystectomy 2000's  . Cataract extraction w/ intraocular lens implant ~ 2008    left eye  . Lithotripsy ~ 2011    "twice"  . Cardiac catheterization   . Prostate cryoablation ~ 2008    Family History  Problem Relation Age of Onset  . Hypertension Neg Hx   . Hyperlipidemia Neg Hx   . Heart failure  Neg Hx   . Heart disease Neg Hx   . Colon cancer Neg Hx   . Breast cancer Mother   . Emphysema Sister     was a smoker    History   Social History  . Marital Status: Married    Spouse Name: N/A    Number of Children: 1  . Years of Education: N/A   Occupational History  . Retired     Naval architect    Social History Main Topics  . Smoking status: Former Smoker -- 1.0 packs/day for 50 years    Types: Cigarettes    Quit date: 03/23/2004  . Smokeless tobacco: Current User    Types: Chew  . Alcohol Use: No  . Drug Use: No  . Sexually Active: Not Currently   Other Topics Concern  . Not on file   Social History Narrative   2 caffeine drink daily     History  Smoking status  . Former Smoker -- 1.0 packs/day for 50 years  . Types: Cigarettes  . Quit date: 03/23/2004  Smokeless tobacco  . Current User  . Types: Chew    History  Alcohol Use No     Allergies  Allergen Reactions  . Iohexol      Desc:  PT STATED TODAY HE GETS SLIGHT SOB FROM IV CONTRAST. HE NEEDS FULL PREMEDS FROM NOW ON PER DR. MATTERN   . Penicillins Itching and Rash    "haven't took none in 40 years"    Current Outpatient Prescriptions  Medication Sig Dispense Refill  . albuterol (PROVENTIL,VENTOLIN) 90 MCG/ACT inhaler Inhale 2 puffs into the lungs every 6 (six) hours as needed. For wheezing or shortness of breath      . DULoxetine (CYMBALTA) 30 MG capsule Take 60 mg by mouth daily.       Marland Kitchen losartan (COZAAR) 50 MG tablet Take 50 mg by mouth daily.      . metoprolol succinate (TOPROL XL) 25 MG 24 hr tablet Take 1 tablet (25 mg total) by mouth daily.  30 tablet  6  . Multiple Vitamin (MULTIVITAMIN) tablet Take 1 tablet by mouth daily.      . nitroGLYCERIN (NITROSTAT) 0.4 MG SL tablet Place 0.4 mg under the tongue every 5 (five) minutes as needed. For chest pain      . omeprazole (PRILOSEC) 20 MG capsule Take 20 mg by mouth daily.      . Rivaroxaban (XARELTO) 20 MG TABS Take 20 mg by mouth daily.  30 tablet  6  . SPIRIVA HANDIHALER 18 MCG inhalation capsule Place 18 mcg into inhaler and inhale daily.       Marland Kitchen DISCONTD: albuterol (PROVENTIL,VENTOLIN) 90 MCG/ACT inhaler Inhale 2 puffs into the lungs every 6 (six) hours as needed for wheezing or shortness of breath.  17 g  3       Review of Systems:     Cardiac Review of Systems: Y or N  Chest Pain [ y   ]  Resting SOB [ n  ] Exertional SOB  [ n ]  Orthopnea [  n]   Pedal Edema [ n  ]    Palpitations [n  ] Syncope  [n  ]   Presyncope [  n ]  General Review of Systems: [Y] = yes [  ]=no Constitional: recent weight change [  ]; anorexia [  ]; fatigue [  ]; nausea [  ]; night sweats [  ]; fever [  ]; or  chills [  ];                                                                                                                                          Dental: poor dentitionn[  ];   Eye : blurred vision [  ]; diplopia [   ]; vision changes [  ];  Amaurosis fugax[  ]; Resp:  cough [  ];  wheezing[  ];  hemoptysis[  ]; shortness of breath[  ]; paroxysmal nocturnal dyspnea[y  ]; dyspnea on exertion[ y ]; or orthopnea[  ];  GI:  gallstones[  ], vomiting[  ];  dysphagia[  ]; melena[  ];  hematochezia [  ]; heartburn[  ];   Hx of  Colonoscopy[ y 3 years ago ]; GU: kidney stones [  ]; hematuria[ n ];   dysuria [  ];  nocturia[  ];  history of     obstruction [  ];             Skin: rash, swelling[  ];, hair loss[  ];  peripheral edema[  ];  or itching[  ]; Musculosketetal: myalgias[y  ];  joint swelling[ n ];  joint erythema[n  ];  joint pain[  ];  back pain[y  ];  Heme/Lymph: bruising[ y ];  bleeding[  ];  anemia[  ];  Neuro: TIA[  ];  headaches[  ];  stroke[  ];  vertigo[  ];  seizures[  ];   paresthesias[  ];  difficulty walking[n  ];  Psych:depression[  ]; anxiety[  ];  Endocrine: diabetes[  ];  thyroid dysfunction[  ];  Immunizations: Flu [ y ]; Pneumococcal[y  ];  Other:  Physical Exam: BP 163/90  Pulse 91  Resp 16  Ht 5\' 11"  (1.803 m)  Wt 161 lb (73.029 kg)  BMI 22.45 kg/m2  SpO2 96%  General appearance: alert, cooperative, appears stated age and no distress Neurologic: intact Heart: regular rate and rhythm, S1, S2 normal, no murmur, click, rub or gallop and normal apical impulse Lungs: clear to auscultation bilaterally and normal percussion bilaterally Abdomen: soft, non-tender; bowel sounds normal; no masses,  no organomegaly Extremities: extremities normal, atraumatic, no cyanosis or edema and Homans sign is negative, no sign of DVT Wound: He has a formal left thoracotomy incision well healed I do not appreciate any cervical or supraclavicular adenopathy   Diagnostic Studies & Laboratory data:     Recent Radiology Findings:  Dg Chest 2 View  08/30/2011  *RADIOLOGY REPORT*  Clinical Data: Shortness of breath, numbness  CHEST - 2 VIEW  Comparison: 06/14/2011 and CT scan 08/18/2011  Findings: Cardiomediastinal silhouette is stable.  Dual lead cardiac  pacemaker is unchanged in position.  Hyperinflation again noted. No acute infiltrate or pulmonary edema.  Stable osteopenia and degenerative changes thoracic spine.  Again noted 1.3 cm nodule in the left upper lobe.  Again further evaluation with PET scan is recommended to exclude malignancy.  IMPRESSION:  Hyperinflation again noted. No acute infiltrate or pulmonary edema.  Stable osteopenia and degenerative changes thoracic spine. Again noted 1.3 cm nodule in the left upper lobe.  Again further evaluation with PET scan is recommended to exclude malignancy.  Original Report Authenticated By: Natasha Mead, M.D.   Ct Angio Neck W/cm &/or Wo/cm  08/25/2011  *RADIOLOGY REPORT*  Clinical Data:  78 year old male with history of skull base internal carotid artery pseudoaneurysms.  History of lung cancer, pacemaker, prostate cancer, contrast allergy.  CT ANGIOGRAPHY NECK  Technique:  Multidetector CT imaging of the neck was performed using the standard protocol during bolus administration of intravenous contrast.  Multiplanar CT image reconstructions including MIPs were obtained to evaluate the vascular anatomy. Carotid stenosis measurements (when applicable) are obtained utilizing NASCET criteria, using the distal internal carotid diameter as the denominator.  Contrast: OMNIPAQUE IOHEXOL 350 MG/ML SOLN 13 hours steroid premedication done with no apparent adverse reaction at the time of imaging.  Comparison:  08/16/2009, 10/03/2008.  Chest CT 08/18/2011.  Findings:  Right chest cardiac pacemaker again noted.  Left posterior upper lobe pleural-based spiculated lesion as described on the recent chest CT.  Underlying emphysema and apical scarring.  No superior mediastinal lymphadenopathy.  Stable sub centimeter right thyroid nodules, inconsequential.  Negative larynx.  Stable pharynx.  Negative parapharyngeal, retropharyngeal and sublingual spaces.  Negative submandibular glands.  Stable parotid glands.  No cervical  lymphadenopathy.  Advanced left cervical spine facet degeneration. Visualized paranasal sinuses and mastoids are clear. No acute osseous abnormality identified.  Grossly negative visualized brain parenchyma.  Vascular Findings: Three-vessel arch configuration with no arch atherosclerosis.  No great vessel origin stenosis.  Normal right common carotid artery is stable.  Right carotid bifurcation is stable widely patent with mild soft plaque. Tortuous distal cervical right ICA with a mostly fusiform type aneurysm is stable since 2011 measuring 8 x 8 x 9 mm (series 5 image 105, series 4082 image 69).  No stenosis.  Negative visualized right ICA siphon.  Mildly nondominant right vertebral artery has a normal origin. Right paravertebral venous contrast reflux noted similar to the prior exam.  Negative cervical right vertebral artery.  Pain right PICA.  Mildly dominant left vertebral artery has a normal origin.  Normal cervical left vertebral artery.  The distal left vertebral artery primarily supplies the basilar.  Stable and normal left common carotid artery and left carotid bifurcation except for mild soft and faintly calcified atherosclerotic plaque.  Tortuous cervical left ICA with a rim calcified mostly thrombosed pseudoaneurysm arising medially from the left ICA at the level of the C2 vertebral body (series 401 image 120, series 5 image 100.  This is stable with this lesion measuring about 11 mm in length.  Ectasia of the adjacent left ICA. More distal tortuosity and then just below the skull base there is a second partially calcified saccular pseudoaneurysm with persistent enhancement and a broad neck.  This is stable with the enhancing portion measuring 8 x 10 x 11 mm.  Thrombosis within the pseudoaneurysm continues to the foramen lacerum.  The visualized left ICA siphons otherwise negative.   Review of the MIP images confirms the above findings.  IMPRESSION: 1.  Stable bilateral cervical ICA pseudoaneurysms  since 2011 as detailed above. 2.  Mild carotid bifurcation atherosclerosis.  No atherosclerotic stenosis in the neck. 3.  Emphysema.  The left apical pleural based lung lesion, see chest  CT report 08/18/2011.  Original Report Authenticated By: Harley Hallmark, M.D.   Ct Chest Wo Contrast  08/18/2011  *RADIOLOGY REPORT*  Clinical Data: Bilateral rib pain and soreness for 2 months, history of prostate carcinoma, former smoking history  CT CHEST WITHOUT CONTRAST  Technique:  Multidetector CT imaging of the chest was performed following the standard protocol without IV contrast.  Comparison: Chest x-ray of 06/14/2011  Findings: Severe centrilobular emphysema is noted particularly involving the upper lobes.  Within the left upper lobe, there is a pleural based opacity posteriorly of 1.3 x 1.1 x 1.8 cm.  PET-CT is recommended to assess for metabolic activity versus close follow-up CT in 3-4 months to assess stability.  No other suspicious pulmonary nodule or mass is seen.  No pleural effusion is noted. On bone window images no abnormality of the ribs is seen.  On soft tissue window images, probable small thyroid nodules are present bilaterally.  No mediastinal or hilar adenopathy is seen. The ascending aorta measures 4.3 cm in diameter.  There is cardiomegaly present.  A permanent pacemaker is noted.  An exophytic lesion emanates from the upper pole of the right kidney medially measuring 1.6 x 1.9 cm with an attenuation of 14 HU most consistent with complex cyst.  Several other more simple appearing right renal cysts are noted as well.  An exophytic structure in the mid left kidney also is most consistent with cyst.  IMPRESSION:  1.  Pleural based nodular lesion in the posterior left upper hemithorax of 1.3 x 1.1 x 1.8 cm.  Consider PET CT for more acute workup versus close follow-up CT of the chest in 3-4 months. 2.  Centrilobular emphysema. 3.  No abnormality of the ribs is seen. 4.  Fusiform dilatation of the  ascending aorta with maximum diameter of 4.3 cm.  Original Report Authenticated By: Juline Patch, M.D.   Nm Pet Image Initial (pi) Skull Base To Thigh  09/03/2011  *RADIOLOGY REPORT*  Clinical Data: Initial treatment strategy for lung nodule.  NUCLEAR MEDICINE PET SKULL BASE TO THIGH  Fasting Blood Glucose:  99  Technique:  19.2 mCi F-18 FDG was injected intravenously. CT data was obtained and used for attenuation correction and anatomic localization only.  (This was not acquired as a diagnostic CT examination.) Additional exam technical data entered on technologist worksheet.  Comparison:  CT chest 08/18/2011 and CT neck 08/25/2011.  Findings:  Neck: Focal hypermetabolism is seen in the left parotid, with an S U V max of 5.3.  There is a 7 mm nodule within the left parotid gland, image 29.  No additional areas of abnormal hypermetabolism in the neck.  CT images show no acute findings.  Chest:  An irregular subpleural left upper lobe nodule measures 1.2 x 1.3 cm and has an S U V max of 2.2.  No additional areas of abnormal hypermetabolism in the chest.  CT images show no acute findings.  A 3.9 x 5.6 cm low attenuation lesion in the subcutaneous tissues of the upper right paraspinal region is likely a sebaceous cyst.  No pericardial or pleural effusion. Emphysema.  Abdomen/Pelvis:  No abnormal hypermetabolic activity within the liver, pancreas, adrenal glands, or spleen.  No hypermetabolic lymph nodes in the abdomen or pelvis.  CT images show no acute findings.  A stone is seen in the lower pole left kidney.  Atherosclerotic calcification of the arterial vasculature without abdominal aortic aneurysm.  Bladder wall appears thickened but the bladder is under  distended. Calcifications are seen in the prostate.  [place]:  No focal hypermetabolic activity to suggest skeletal metastasis.  IMPRESSION:  1.  Irregular subpleural left upper lobe nodule is mildly hypermetabolic and therefore worrisome for a low grade  adenocarcinoma.  If this is a non-small cell lung cancer, it is most consistent with a stage IA lesion. Consultation with the [facility] [phone]) should be considered. 2.  Small nodule the left parotid gland with associated hypermetabolism.  Finding is nonspecific. 3.  Left renal stone.  Original Report Authenticated By: Reyes Ivan, M.D.    Recent Lab Findings: Lab Results  Component Value Date   WBC 6.5 09/21/2011   HGB 15.4 09/21/2011   HCT 44.8 09/21/2011   PLT 216 09/21/2011   GLUCOSE 108* 08/30/2011   CHOL 192 07/22/2011   TRIG 76 07/22/2011   HDL 57 07/22/2011   LDLCALC 098* 07/22/2011   ALT 16 07/21/2011   AST 21 07/21/2011   NA 140 08/30/2011   K 4.9 08/30/2011   CL 102 08/30/2011   CREATININE 0.97 08/30/2011   BUN 10 08/30/2011   CO2 26 08/30/2011   TSH 4.001 06/13/2011   INR 1.03 09/21/2011   HGBA1C 5.0 06/13/2011   PFTS done FEV1 2.19 72% with 10 & improvement with bronchodilators, DLCO 80 % 0n 09/14/2011  PATH: [id number] Diagnosis LUNG, FINE NEEDLE ASPIRATION, LEFT: MALIGNANT CELLS PRESENT SEE COMMENT COMMENT: THE CYTOLOGY IS THAT OF NON-SMALL CELL CARCINOMA. HOWEVER, THERE IS VERY SCANT CELL BLOCK MATERIAL PRESENT FOR IMMUNOPHENOTYPING. IN THE SECTIONS TAKEN, THERE IS VERY FOCAL TTF-1 EXPRESSION AND ABSENCE OF PSA EXPRESSION. ALTHOUGH THE IMMUNOPHENOTYPE IS SUGGESTIVE OF PRIMARY LUNG ADENOCARCINOMA, THE SCANT TUMOR PRESENT MAKES A DEFINITIVE DIAGNOSIS NOT POSSIBLE. THE CASE WAS REVIEWED WITH DR Colonel Bald, WHO CONCURS. Preliminary Diagnosis REQUEST MOREMATERIAL FOR DEFINITIVE DIAGNOSIS (RH) [place] RUND DO Pathologist, Electronic Signature (Case signed 09/23/2011)  Recent Cath: NAYTHEN HEIKKILA  [id number]  5/1/201312:07 PM  [name], MD, MD  Procedure Performed:  1. Left Heart Catheterization 2. Selective Coronary Angiography 3. Left ventricular angiogram Operator: Verne Carrow, MD  Arterial access site: Right radial artery.    Indication: Chest pain known CAD.  Procedure Details:  The risks, benefits, complications, treatment options, and expected outcomes were discussed with the patient. The patient and/or family concurred with the proposed plan, giving informed consent. The patient was brought to the cath lab after IV hydration was begun and oral premedication was given. The patient was further sedated with Versed. The right wrist was assessed with an Allens test which was positive. The right wrist was prepped and draped in a sterile fashion. 1% lidocaine was used for local anesthesia. Using the modified Seldinger access technique, a 5 French sheath was placed in the right radial artery. 1.25 mg Nicardipine was given through the sheath. 3500 units IV heparin was given. Standard diagnostic catheters were used to perform selective coronary angiography. A pigtail catheter was used to perform a left ventricular angiogram. The sheath was removed from the right radial artery and a Terumo hemostasis band was applied at the arteriotomy site on the right wrist.  There were no immediate complications. The patient was taken to the recovery area in stable condition.  Hemodynamic Findings:  Central aortic pressure: 138/77  Left ventricular pressure: 133/9/13  Angiographic Findings:  Left main: No obstructive disease.  Left Anterior Descending Artery: Large caliber vessel that courses to the apex. The mid vessel has 30% stenosis at the takeoff of the small  to moderate sized diagonal branch. The diagonal branch is a small to moderate sized vessel with 70% ostial stenosis.  Circumflex Artery: Moderate sized intermediate branch with mild plaque disease. The Circumflex system is small to moderate sized with mild plaque.  Right Coronary Artery: Large dominant artery with mild plaque throughout the proximal and mid vessel.  Left Ventricular Angiogram: LVEF=55%.  Impression:  1. Mild to moderate non-obstructive disease in LAD, Circumflex and  RCA  2. Ostial stenosis of small to moderate sized diagonal branch. This is a difficult location for PCI given the angle and ostial location.  3. Preserved LV systolic function.  Recommendations: I would recommend continued medical management at this time. I will add Imdur. Restart Xarelto tonight at 6pm.  Complications: None. The patient tolerated the procedure well.    Assessment / Plan:    Patient is a 78 year old male with unexplained bilateral lower chest pain also with some hoarseness who has had extensive evaluation and multiple emergency room visits. A CT scan and PET scan show a 1.3 cm borderline hypermetabolic lesion in the left upper lobe of the lung. There is now evidence of non-small cell cancer in the lesion noted on PET scan.  I recommended to the patient that we proceed with left video-assisted thoracoscopy possible thoracotomy and lung resection for biopsy proven left lung malignancy. The patient in his 68s had a previous left thoracotomy for what he described as "an underdeveloped lung". The previous thoracotomy will obviously complicate the resection of his left lung. The risks and options were discussed with he and his wife in detail, he understands that with what appears to be early stage lung cancer surgical resection offers the best chance of cure and he is willing to proceed.  The patient has been on anticoagulation for a pulmonary embolus more than 10 years ago.  This was stopped prior to the needle biopsy and but was not be resumed afterwards. He will stay off anticoagulation until we proceed with surgical resection Tuesday July 29.  The goals risks and alternatives of the planned surgical procedure bronchoscopy, left VATS possible thoracotomy and lung resection have been discussed with the patient in detail. The risks of the procedure including death, infection, stroke, myocardial infarction, bleeding, blood transfusion have all been discussed specifically.  I have quoted  [name] a 4 % of perioperative mortality and a complication rate as high as 15 %. The patient's questions have been answered.[name] is willing  to proceed with the planned procedure.    [name] MD  Beeper [phone] Office [phone] 09/25/2011 4:38 PM

## 2011-10-06 ENCOUNTER — Encounter (HOSPITAL_COMMUNITY): Payer: Self-pay | Admitting: Pharmacy Technician

## 2011-10-15 NOTE — Pre-Procedure Instructions (Signed)
20 [name]  10/15/2011   Your procedure is scheduled on:  July 30th  Report to [facility] at 0530 AM.  Call this number if you have problems the morning of surgery: [phone]   Remember:   Do not eat food or drink:After Midnight.  Take these medicines the morning of surgery with A SIP OF WATER: cymbalta, inhaler if needed   Do not wear jewelry, make-up or nail polish.  Do not wear lotions, powders, or perfumes.   Do not shave 48 hours prior to surgery. Men may shave face and neck.  Do not bring valuables to the hospital.  Contacts, dentures or bridgework may not be worn into surgery.  Leave suitcase in the car. After surgery it may be brought to your room.  For patients admitted to the hospital, checkout time is 11:00 AM the day of discharge.   Patients discharged the day of surgery will not be allowed to drive home.  Special Instructions: CHG Shower Use Special Wash: 1/2 bottle night before surgery and 1/2 bottle morning of surgery.   Please read over the following fact sheets that you were given: Pain Booklet, Coughing and Deep Breathing, Blood Transfusion Information, MRSA Information and Surgical Site Infection Prevention

## 2011-10-16 ENCOUNTER — Encounter (HOSPITAL_COMMUNITY): Payer: Self-pay

## 2011-10-16 ENCOUNTER — Ambulatory Visit (HOSPITAL_COMMUNITY)
Admission: RE | Admit: 2011-10-16 | Discharge: 2011-10-16 | Disposition: A | Discharge status: Home / Self Care | Payer: Medicare Other | Source: Ambulatory Visit | Attending: Cardiothoracic Surgery | Admitting: Cardiothoracic Surgery

## 2011-10-16 ENCOUNTER — Encounter (HOSPITAL_COMMUNITY)
Admission: RE | Admit: 2011-10-16 | Discharge: 2011-10-16 | Disposition: A | Discharge status: Home / Self Care | Payer: Medicare Other | Source: Ambulatory Visit | Attending: Cardiothoracic Surgery | Admitting: Cardiothoracic Surgery

## 2011-10-16 VITALS — BP 154/80 | HR 98 | Temp 97.1°F | Resp 20 | Ht 71.0 in | Wt 161.0 lb

## 2011-10-16 DIAGNOSIS — Z950 Presence of cardiac pacemaker: Secondary | ICD-10-CM | POA: Insufficient documentation

## 2011-10-16 DIAGNOSIS — D381 Neoplasm of uncertain behavior of trachea, bronchus and lung: Secondary | ICD-10-CM

## 2011-10-16 DIAGNOSIS — I10 Essential (primary) hypertension: Secondary | ICD-10-CM | POA: Insufficient documentation

## 2011-10-16 DIAGNOSIS — Z01812 Encounter for preprocedural laboratory examination: Secondary | ICD-10-CM | POA: Insufficient documentation

## 2011-10-16 DIAGNOSIS — Z01818 Encounter for other preprocedural examination: Secondary | ICD-10-CM | POA: Insufficient documentation

## 2011-10-16 LAB — URINALYSIS, ROUTINE W REFLEX MICROSCOPIC
Bilirubin Urine: NEGATIVE
Glucose, UA: NEGATIVE mg/dL
Hgb urine dipstick: NEGATIVE
Ketones, ur: NEGATIVE mg/dL
Leukocytes, UA: NEGATIVE
Nitrite: NEGATIVE
Protein, ur: NEGATIVE mg/dL
Specific Gravity, Urine: 1.012 (ref 1.005–1.030)
Urobilinogen, UA: 0.2 mg/dL (ref 0.0–1.0)
pH: 7 (ref 5.0–8.0)

## 2011-10-16 LAB — COMPREHENSIVE METABOLIC PANEL
ALT: 11 U/L (ref 0–53)
AST: 16 U/L (ref 0–37)
Albumin: 3.7 g/dL (ref 3.5–5.2)
Alkaline Phosphatase: 97 U/L (ref 39–117)
BUN: 7 mg/dL (ref 6–23)
CO2: 23 mEq/L (ref 19–32)
Calcium: 11.1 mg/dL — ABNORMAL HIGH (ref 8.4–10.5)
Chloride: 107 mEq/L (ref 96–112)
Creatinine, Ser: 0.91 mg/dL (ref 0.50–1.35)
GFR calc Af Amer: >90 mL/min (ref >90)
GFR calc non Af Amer: 80 mL/min — ABNORMAL LOW (ref >90)
Glucose, Bld: 96 mg/dL (ref 70–99)
Potassium: 3.9 mEq/L (ref 3.5–5.1)
Sodium: 140 mEq/L (ref 135–145)
Total Bilirubin: 0.7 mg/dL (ref 0.3–1.2)
Total Protein: 6.8 g/dL (ref 6.0–8.3)

## 2011-10-16 LAB — PROTIME-INR
INR: 0.97 (ref 0.00–1.49)
Prothrombin Time: 13.1 seconds (ref 11.6–15.2)

## 2011-10-16 LAB — CBC
HCT: 42.9 % (ref 39.0–52.0)
Hemoglobin: 15.5 g/dL (ref 13.0–17.0)
MCH: 32.4 pg (ref 26.0–34.0)
MCHC: 36.1 g/dL — ABNORMAL HIGH (ref 30.0–36.0)
MCV: 89.6 fL (ref 78.0–100.0)
Platelets: 221 10*3/uL (ref 150–400)
RBC: 4.79 MIL/uL (ref 4.22–5.81)
RDW: 13 % (ref 11.5–15.5)
WBC: 7.1 10*3/uL (ref 4.0–10.5)

## 2011-10-16 LAB — BLOOD GAS, ARTERIAL
Acid-Base Excess: 1.7 mmol/L (ref 0.0–2.0)
Bicarbonate: 25.7 mEq/L — ABNORMAL HIGH (ref 20.0–24.0)
Drawn by: 206361
FIO2: 0.21 %
O2 Saturation: 96.1 %
Patient temperature: 98.6
TCO2: 26.9 mmol/L (ref 0–100)
pCO2 arterial: 39.8 mmHg (ref 35.0–45.0)
pH, Arterial: 7.425 (ref 7.350–7.450)
pO2, Arterial: 77.9 mmHg — ABNORMAL LOW (ref 80.0–100.0)

## 2011-10-16 LAB — SURGICAL PCR SCREEN
MRSA, PCR: NEGATIVE
Staphylococcus aureus: NEGATIVE

## 2011-10-16 LAB — TYPE AND SCREEN
ABO/RH(D): O POS
Antibody Screen: NEGATIVE

## 2011-10-16 LAB — ABO/RH: ABO/RH(D): O POS

## 2011-10-16 LAB — APTT: aPTT: 33 seconds (ref 24–37)

## 2011-10-16 NOTE — Progress Notes (Signed)
Patient informed Nurse that he has a pacemaker and his cardiologist is Dr. Johney Frame. Nurse faxed perioperative prescription for implanted cardiac device programming sheet to [place] at [phone]. Stress test and Cardiac Cath results in EPIC.

## 2011-10-19 MED ORDER — VANCOMYCIN HCL IN DEXTROSE 1-5 GM/200ML-% IV SOLN
1000.0000 mg | INTRAVENOUS | Status: AC
  Administered 2011-10-20: 1000 mg via INTRAVENOUS
  Filled 2011-10-19: qty 200

## 2011-10-19 NOTE — Progress Notes (Signed)
Left message with Device [facility] at [facility] and requested that they return Implanted Cardiac Device Form today.

## 2011-10-20 ENCOUNTER — Inpatient Hospital Stay (HOSPITAL_COMMUNITY)
Admission: RE | Admit: 2011-10-20 | Discharge: 2011-10-28 | DRG: 164 | Disposition: A | Discharge status: Home / Self Care | Payer: Medicare Other | Source: Ambulatory Visit | Attending: Cardiothoracic Surgery | Admitting: Cardiothoracic Surgery

## 2011-10-20 ENCOUNTER — Encounter (HOSPITAL_COMMUNITY): Payer: Self-pay | Admitting: Vascular Surgery

## 2011-10-20 ENCOUNTER — Encounter (HOSPITAL_COMMUNITY): Payer: Self-pay | Admitting: Anesthesiology

## 2011-10-20 ENCOUNTER — Encounter (HOSPITAL_COMMUNITY)
Admission: RE | Disposition: A | Discharge status: Home / Self Care | Payer: Self-pay | Source: Ambulatory Visit | Attending: Cardiothoracic Surgery

## 2011-10-20 ENCOUNTER — Ambulatory Visit (HOSPITAL_COMMUNITY): Payer: Medicare Other | Admitting: Anesthesiology

## 2011-10-20 ENCOUNTER — Encounter (HOSPITAL_COMMUNITY): Payer: Self-pay | Admitting: *Deleted

## 2011-10-20 ENCOUNTER — Inpatient Hospital Stay (HOSPITAL_COMMUNITY): Payer: Medicare Other

## 2011-10-20 DIAGNOSIS — I10 Essential (primary) hypertension: Secondary | ICD-10-CM | POA: Diagnosis present

## 2011-10-20 DIAGNOSIS — D381 Neoplasm of uncertain behavior of trachea, bronchus and lung: Secondary | ICD-10-CM

## 2011-10-20 DIAGNOSIS — J4489 Other specified chronic obstructive pulmonary disease: Secondary | ICD-10-CM | POA: Diagnosis present

## 2011-10-20 DIAGNOSIS — Z8546 Personal history of malignant neoplasm of prostate: Secondary | ICD-10-CM

## 2011-10-20 DIAGNOSIS — I2510 Atherosclerotic heart disease of native coronary artery without angina pectoris: Secondary | ICD-10-CM | POA: Diagnosis present

## 2011-10-20 DIAGNOSIS — Z950 Presence of cardiac pacemaker: Secondary | ICD-10-CM

## 2011-10-20 DIAGNOSIS — C3410 Malignant neoplasm of upper lobe, unspecified bronchus or lung: Principal | ICD-10-CM | POA: Diagnosis present

## 2011-10-20 DIAGNOSIS — F17200 Nicotine dependence, unspecified, uncomplicated: Secondary | ICD-10-CM | POA: Diagnosis present

## 2011-10-20 DIAGNOSIS — K5900 Constipation, unspecified: Secondary | ICD-10-CM | POA: Diagnosis present

## 2011-10-20 DIAGNOSIS — E785 Hyperlipidemia, unspecified: Secondary | ICD-10-CM | POA: Diagnosis present

## 2011-10-20 DIAGNOSIS — J95812 Postprocedural air leak: Secondary | ICD-10-CM | POA: Diagnosis not present

## 2011-10-20 DIAGNOSIS — J449 Chronic obstructive pulmonary disease, unspecified: Secondary | ICD-10-CM | POA: Diagnosis present

## 2011-10-20 DIAGNOSIS — Y836 Removal of other organ (partial) (total) as the cause of abnormal reaction of the patient, or of later complication, without mention of misadventure at the time of the procedure: Secondary | ICD-10-CM | POA: Diagnosis not present

## 2011-10-20 DIAGNOSIS — K219 Gastro-esophageal reflux disease without esophagitis: Secondary | ICD-10-CM | POA: Diagnosis present

## 2011-10-20 DIAGNOSIS — Z86718 Personal history of other venous thrombosis and embolism: Secondary | ICD-10-CM

## 2011-10-20 DIAGNOSIS — Y9210 Unspecified residential institution as the place of occurrence of the external cause: Secondary | ICD-10-CM | POA: Diagnosis not present

## 2011-10-20 HISTORY — PX: VIDEO BRONCHOSCOPY: SHX5072

## 2011-10-20 SURGERY — VIDEO ASSISTED THORACOSCOPY (VATS)/THOROCOTOMY
Anesthesia: General | Site: Chest | Wound class: Clean Contaminated

## 2011-10-20 MED ORDER — SENNOSIDES-DOCUSATE SODIUM 8.6-50 MG PO TABS
1.0000 | ORAL_TABLET | Freq: Every evening | ORAL | Status: DC | PRN
  Administered 2011-10-26: 1 via ORAL
  Filled 2011-10-20 (×3): qty 1

## 2011-10-20 MED ORDER — BUPIVACAINE ON-Q PAIN PUMP (FOR ORDER SET NO CHG)
INJECTION | Status: DC
  Filled 2011-10-20: qty 1

## 2011-10-20 MED ORDER — OXYCODONE-ACETAMINOPHEN 5-325 MG PO TABS
1.0000 | ORAL_TABLET | ORAL | Status: DC | PRN
  Administered 2011-10-24: 2 via ORAL
  Administered 2011-10-24: 1 via ORAL
  Administered 2011-10-25 – 2011-10-27 (×4): 2 via ORAL
  Filled 2011-10-20: qty 2
  Filled 2011-10-20: qty 1
  Filled 2011-10-20 (×2): qty 2
  Filled 2011-10-20: qty 1
  Filled 2011-10-20 (×2): qty 2

## 2011-10-20 MED ORDER — BUPIVACAINE-EPINEPHRINE PF 0.25-1:200000 % IJ SOLN
INTRAMUSCULAR | Status: AC
  Filled 2011-10-20: qty 30

## 2011-10-20 MED ORDER — NEOSTIGMINE METHYLSULFATE 1 MG/ML IJ SOLN
INTRAMUSCULAR | Status: DC | PRN
  Administered 2011-10-20: 3 mg via INTRAVENOUS

## 2011-10-20 MED ORDER — NALOXONE HCL 0.4 MG/ML IJ SOLN: 0.4000 mg | INTRAMUSCULAR | Status: DC | PRN

## 2011-10-20 MED ORDER — POTASSIUM CHLORIDE 10 MEQ/50ML IV SOLN
10.0000 meq | Freq: Every day | INTRAVENOUS | Status: DC | PRN
  Filled 2011-10-20: qty 50

## 2011-10-20 MED ORDER — ALBUTEROL SULFATE HFA 108 (90 BASE) MCG/ACT IN AERS: 2.0000 | INHALATION_SPRAY | Freq: Four times a day (QID) | RESPIRATORY_TRACT | Status: DC | PRN

## 2011-10-20 MED ORDER — FENTANYL 10 MCG/ML IV SOLN
INTRAVENOUS | Status: DC
  Administered 2011-10-20: 12:00:00 via INTRAVENOUS
  Administered 2011-10-20: 20 ug via INTRAVENOUS
  Administered 2011-10-20: 10 ug via INTRAVENOUS
  Administered 2011-10-21: 20 ug via INTRAVENOUS
  Administered 2011-10-21 (×2): 10 ug via INTRAVENOUS
  Administered 2011-10-22: 30 ug via INTRAVENOUS
  Administered 2011-10-22: 40 ug via INTRAVENOUS
  Administered 2011-10-22: 14:00:00 via INTRAVENOUS
  Administered 2011-10-23: 50 ug via INTRAVENOUS
  Filled 2011-10-20 (×2): qty 50

## 2011-10-20 MED ORDER — ONDANSETRON HCL 4 MG/2ML IJ SOLN: 4.0000 mg | Freq: Four times a day (QID) | INTRAMUSCULAR | Status: DC | PRN

## 2011-10-20 MED ORDER — DIPHENHYDRAMINE HCL 50 MG/ML IJ SOLN: 12.5000 mg | Freq: Four times a day (QID) | INTRAMUSCULAR | Status: DC | PRN

## 2011-10-20 MED ORDER — HYDROMORPHONE HCL PF 1 MG/ML IJ SOLN
0.2500 mg | INTRAMUSCULAR | Status: DC | PRN
  Administered 2011-10-20 (×2): 0.5 mg via INTRAVENOUS

## 2011-10-20 MED ORDER — ONE-DAILY MULTI VITAMINS PO TABS: 1.0000 | ORAL_TABLET | Freq: Every day | ORAL | Status: DC

## 2011-10-20 MED ORDER — SODIUM CHLORIDE 0.9 % IJ SOLN: 9.0000 mL | INTRAMUSCULAR | Status: DC | PRN

## 2011-10-20 MED ORDER — LIDOCAINE HCL (CARDIAC) 20 MG/ML IV SOLN
INTRAVENOUS | Status: DC | PRN
  Administered 2011-10-20: 50 mg via INTRAVENOUS

## 2011-10-20 MED ORDER — PHENYLEPHRINE HCL 10 MG/ML IJ SOLN
10.0000 mg | INTRAVENOUS | Status: DC | PRN
  Administered 2011-10-20: 15 ug/min via INTRAVENOUS

## 2011-10-20 MED ORDER — FENTANYL CITRATE 0.05 MG/ML IJ SOLN
INTRAMUSCULAR | Status: DC | PRN
  Administered 2011-10-20: 100 ug via INTRAVENOUS
  Administered 2011-10-20: 10 ug via INTRAVENOUS
  Administered 2011-10-20: 50 ug via INTRAVENOUS

## 2011-10-20 MED ORDER — ROCURONIUM BROMIDE 100 MG/10ML IV SOLN
INTRAVENOUS | Status: DC | PRN
  Administered 2011-10-20: 50 mg via INTRAVENOUS
  Administered 2011-10-20: 20 mg via INTRAVENOUS

## 2011-10-20 MED ORDER — TRAMADOL HCL 50 MG PO TABS
50.0000 mg | ORAL_TABLET | Freq: Four times a day (QID) | ORAL | Status: DC | PRN
  Administered 2011-10-23: 100 mg via ORAL
  Filled 2011-10-20: qty 2

## 2011-10-20 MED ORDER — 0.9 % SODIUM CHLORIDE (POUR BTL) OPTIME
TOPICAL | Status: DC | PRN
  Administered 2011-10-20: 3000 mL

## 2011-10-20 MED ORDER — ACETAMINOPHEN 10 MG/ML IV SOLN
1000.0000 mg | Freq: Four times a day (QID) | INTRAVENOUS | Status: AC
  Administered 2011-10-20 – 2011-10-21 (×3): 1000 mg via INTRAVENOUS
  Filled 2011-10-20 (×4): qty 100

## 2011-10-20 MED ORDER — DIPHENHYDRAMINE HCL 12.5 MG/5ML PO ELIX
12.5000 mg | ORAL_SOLUTION | Freq: Four times a day (QID) | ORAL | Status: DC | PRN
  Filled 2011-10-20: qty 5

## 2011-10-20 MED ORDER — DEXTROSE 5 % IV SOLN: 1.5000 g | Freq: Two times a day (BID) | INTRAVENOUS | Status: DC

## 2011-10-20 MED ORDER — OXYCODONE-ACETAMINOPHEN 5-325 MG PO TABS: 1.0000 | ORAL_TABLET | ORAL | Status: DC | PRN

## 2011-10-20 MED ORDER — DULOXETINE HCL 60 MG PO CPEP
60.0000 mg | ORAL_CAPSULE | Freq: Every day | ORAL | Status: DC
  Administered 2011-10-21 – 2011-10-28 (×8): 60 mg via ORAL
  Filled 2011-10-20 (×8): qty 1

## 2011-10-20 MED ORDER — GLYCOPYRROLATE 0.2 MG/ML IJ SOLN
INTRAMUSCULAR | Status: DC | PRN
  Administered 2011-10-20: 0.6 mg via INTRAVENOUS

## 2011-10-20 MED ORDER — HEMOSTATIC AGENTS (NO CHARGE) OPTIME
TOPICAL | Status: DC | PRN
  Administered 2011-10-20: 1 via TOPICAL

## 2011-10-20 MED ORDER — ADULT MULTIVITAMIN W/MINERALS CH
1.0000 | ORAL_TABLET | Freq: Every day | ORAL | Status: DC
  Administered 2011-10-21 – 2011-10-28 (×8): 1 via ORAL
  Filled 2011-10-20 (×8): qty 1

## 2011-10-20 MED ORDER — VANCOMYCIN HCL IN DEXTROSE 1-5 GM/200ML-% IV SOLN
1000.0000 mg | Freq: Two times a day (BID) | INTRAVENOUS | Status: AC
  Administered 2011-10-20: 1000 mg via INTRAVENOUS
  Filled 2011-10-20: qty 200

## 2011-10-20 MED ORDER — LACTATED RINGERS IV SOLN
INTRAVENOUS | Status: DC | PRN
  Administered 2011-10-20: 07:00:00 via INTRAVENOUS

## 2011-10-20 MED ORDER — BISACODYL 5 MG PO TBEC
10.0000 mg | DELAYED_RELEASE_TABLET | Freq: Every day | ORAL | Status: DC
  Administered 2011-10-21 – 2011-10-28 (×6): 10 mg via ORAL
  Filled 2011-10-20 (×6): qty 2

## 2011-10-20 MED ORDER — ONDANSETRON HCL 4 MG/2ML IJ SOLN: 4.0000 mg | Freq: Once | INTRAMUSCULAR | Status: DC | PRN

## 2011-10-20 MED ORDER — HYDROMORPHONE HCL PF 1 MG/ML IJ SOLN
INTRAMUSCULAR | Status: AC
  Administered 2011-10-20: 0.5 mg via INTRAVENOUS
  Filled 2011-10-20: qty 1

## 2011-10-20 MED ORDER — PROPOFOL 10 MG/ML IV EMUL
INTRAVENOUS | Status: DC | PRN
  Administered 2011-10-20: 150 mg via INTRAVENOUS

## 2011-10-20 MED ORDER — OXYCODONE HCL 5 MG PO TABS: 5.0000 mg | ORAL_TABLET | ORAL | Status: AC | PRN

## 2011-10-20 MED ORDER — BUPIVACAINE-EPINEPHRINE 0.25% -1:200000 IJ SOLN
INTRAMUSCULAR | Status: DC | PRN
  Administered 2011-10-20: 10 mL

## 2011-10-20 MED ORDER — BUPIVACAINE 0.25 % ON-Q PUMP SINGLE CATH 400 ML
400.0000 mL | INJECTION | Status: AC
  Filled 2011-10-20: qty 400

## 2011-10-20 MED ORDER — DEXTROSE-NACL 5-0.9 % IV SOLN
INTRAVENOUS | Status: DC
  Administered 2011-10-20: 100 mL/h via INTRAVENOUS
  Administered 2011-10-20: 100 mL via INTRAVENOUS
  Administered 2011-10-21: 12:00:00 via INTRAVENOUS
  Administered 2011-10-22: 20 mL via INTRAVENOUS

## 2011-10-20 MED ORDER — ALBUTEROL 90 MCG/ACT IN AERS: 2.0000 | INHALATION_SPRAY | Freq: Four times a day (QID) | RESPIRATORY_TRACT | Status: DC | PRN

## 2011-10-20 SURGICAL SUPPLY — 85 items
APPLICATOR TIP EXT COSEAL (VASCULAR PRODUCTS) IMPLANT
BENZOIN TINCTURE PRP APPL 2/3 (GAUZE/BANDAGES/DRESSINGS) ×3 IMPLANT
BLADE SURG 11 STRL SS (BLADE) ×3 IMPLANT
BRUSH CYTOL CELLEBRITY 1.5X140 (MISCELLANEOUS) IMPLANT
CANISTER SUCTION 2500CC (MISCELLANEOUS) ×3 IMPLANT
CATH KIT ON Q 5IN SLV (PAIN MANAGEMENT) ×3 IMPLANT
CATH THORACIC 28FR (CATHETERS) IMPLANT
CATH THORACIC 28FR RT ANG (CATHETERS) ×3 IMPLANT
CATH THORACIC 36FR (CATHETERS) IMPLANT
CATH THORACIC 36FR RT ANG (CATHETERS) IMPLANT
CHERRY SPONGEY 1/2 (GAUZE/BANDAGES/DRESSINGS) ×3 IMPLANT
CLIP TI MEDIUM 6 (CLIP) ×3 IMPLANT
CLOTH BEACON ORANGE TIMEOUT ST (SAFETY) ×3 IMPLANT
CONT SPEC 4OZ CLIKSEAL STRL BL (MISCELLANEOUS) ×6 IMPLANT
COVER SURGICAL LIGHT HANDLE (MISCELLANEOUS) ×3 IMPLANT
COVER TABLE BACK 60X90 (DRAPES) IMPLANT
DERMABOND ADHESIVE PROPEN (GAUZE/BANDAGES/DRESSINGS) ×2
DERMABOND ADVANCED .7 DNX6 (GAUZE/BANDAGES/DRESSINGS) ×4 IMPLANT
DRAPE LAPAROSCOPIC ABDOMINAL (DRAPES) ×3 IMPLANT
DRAPE SLUSH MACHINE 52X66 (DRAPES) IMPLANT
DRAPE SLUSH/WARMER DISC (DRAPES) IMPLANT
DRAPE WARM FLUID 44X44 (DRAPE) ×3 IMPLANT
DRILL BIT 7/64X5 (BIT) IMPLANT
ELECT BLADE 4.0 EZ CLEAN MEGAD (MISCELLANEOUS) ×3
ELECT REM PT RETURN 9FT ADLT (ELECTROSURGICAL) ×3
ELECTRODE BLDE 4.0 EZ CLN MEGD (MISCELLANEOUS) ×2 IMPLANT
ELECTRODE REM PT RTRN 9FT ADLT (ELECTROSURGICAL) ×2 IMPLANT
FORCEPS BIOP RJ4 1.8 (CUTTING FORCEPS) IMPLANT
GLOVE BIO SURGEON STRL SZ 6.5 (GLOVE) ×9 IMPLANT
GLOVE BIO SURGEON STRL SZ7.5 (GLOVE) ×3 IMPLANT
GLOVE BIOGEL PI IND STRL 6.5 (GLOVE) ×8 IMPLANT
GLOVE BIOGEL PI IND STRL 7.0 (GLOVE) ×8 IMPLANT
GLOVE BIOGEL PI INDICATOR 6.5 (GLOVE) ×4
GLOVE BIOGEL PI INDICATOR 7.0 (GLOVE) ×4
GLOVE ECLIPSE 6.5 STRL STRAW (GLOVE) ×6 IMPLANT
GOWN STRL NON-REIN LRG LVL3 (GOWN DISPOSABLE) ×15 IMPLANT
HANDLE STAPLE ENDO GIA SHORT (STAPLE) ×1
HEMOSTAT SURGICEL 2X14 (HEMOSTASIS) IMPLANT
KIT BASIN OR (CUSTOM PROCEDURE TRAY) ×3 IMPLANT
KIT ROOM TURNOVER OR (KITS) ×3 IMPLANT
MARKER SKIN DUAL TIP RULER LAB (MISCELLANEOUS) IMPLANT
NEEDLE BIOPSY TRANSBRONCH 21G (NEEDLE) IMPLANT
NS IRRIG 1000ML POUR BTL (IV SOLUTION) ×6 IMPLANT
OIL SILICONE PENTAX (PARTS (SERVICE/REPAIRS)) ×3 IMPLANT
PACK CHEST (CUSTOM PROCEDURE TRAY) ×3 IMPLANT
PAD ARMBOARD 7.5X6 YLW CONV (MISCELLANEOUS) ×6 IMPLANT
RELOAD EGIA 45 MED/THCK PURPLE (STAPLE) ×9 IMPLANT
SCISSORS LAP 5X35 DISP (ENDOMECHANICALS) IMPLANT
SEALANT PROGEL (MISCELLANEOUS) ×3 IMPLANT
SEALANT SURG COSEAL 4ML (VASCULAR PRODUCTS) IMPLANT
SEALANT SURG COSEAL 8ML (VASCULAR PRODUCTS) IMPLANT
SOLUTION ANTI FOG 6CC (MISCELLANEOUS) ×3 IMPLANT
SPONGE GAUZE 4X4 12PLY (GAUZE/BANDAGES/DRESSINGS) ×6 IMPLANT
STAPLER ENDO GIA 12MM SHORT (STAPLE) ×2 IMPLANT
STRIP PERI DRY VERITAS 45 (STAPLE) ×9 IMPLANT
SUT PROLENE 3 0 SH DA (SUTURE) IMPLANT
SUT PROLENE 4 0 RB 1 (SUTURE)
SUT PROLENE 4-0 RB1 .5 CRCL 36 (SUTURE) IMPLANT
SUT SILK  1 MH (SUTURE) ×2
SUT SILK 1 MH (SUTURE) ×4 IMPLANT
SUT SILK 2 0SH CR/8 30 (SUTURE) IMPLANT
SUT SILK 3 0 SH CR/8 (SUTURE) ×3 IMPLANT
SUT STEEL 1 (SUTURE) ×6 IMPLANT
SUT VIC AB 1 CTX 18 (SUTURE) ×3 IMPLANT
SUT VIC AB 1 CTX 36 (SUTURE)
SUT VIC AB 1 CTX36XBRD ANBCTR (SUTURE) IMPLANT
SUT VIC AB 2-0 CTX 36 (SUTURE) ×6 IMPLANT
SUT VIC AB 3-0 X1 27 (SUTURE) ×6 IMPLANT
SUT VICRYL 2 TP 1 (SUTURE) ×3 IMPLANT
SWAB COLLECTION DEVICE MRSA (MISCELLANEOUS) IMPLANT
SYR 20ML ECCENTRIC (SYRINGE) ×3 IMPLANT
SYSTEM SAHARA CHEST DRAIN RE-I (WOUND CARE) ×3 IMPLANT
TAPE CLOTH 4X10 WHT NS (GAUZE/BANDAGES/DRESSINGS) ×3 IMPLANT
TAPE CLOTH SURG 4X10 WHT LF (GAUZE/BANDAGES/DRESSINGS) ×3 IMPLANT
TAPE UMBILICAL COTTON 1/8X30 (MISCELLANEOUS) IMPLANT
TIP APPLICATOR SPRAY EXTEND 16 (VASCULAR PRODUCTS) IMPLANT
TOWEL OR 17X24 6PK STRL BLUE (TOWEL DISPOSABLE) ×6 IMPLANT
TOWEL OR 17X26 10 PK STRL BLUE (TOWEL DISPOSABLE) ×6 IMPLANT
TRAP SPECIMEN MUCOUS 40CC (MISCELLANEOUS) ×3 IMPLANT
TRAY FOLEY CATH 14FRSI W/METER (CATHETERS) ×3 IMPLANT
TUBE ANAEROBIC SPECIMEN COL (MISCELLANEOUS) IMPLANT
TUBE CONNECTING 12X1/4 (SUCTIONS) ×3 IMPLANT
TUNNELER SHEATH ON-Q 11GX8 (MISCELLANEOUS) ×3 IMPLANT
WATER STERILE IRR 1000ML POUR (IV SOLUTION) ×3 IMPLANT
YANKAUER SUCT BULB TIP NO VENT (SUCTIONS) ×3 IMPLANT

## 2011-10-20 NOTE — Transfer of Care (Signed)
Immediate Anesthesia Transfer of Care Note  Patient: [name]  Procedure(s) Performed: Procedure(s) (LRB): VIDEO ASSISTED THORACOSCOPY (VATS)/THOROCOTOMY (Left) VIDEO BRONCHOSCOPY (N/A)  Patient Location: PACU  Anesthesia Type: General  Level of Consciousness: awake and alert   Airway & Oxygen Therapy: Patient Spontanous Breathing and Patient connected to face mask oxygen  Post-op Assessment: Report given to PACU RN and Post -op Vital signs reviewed and stable  Post vital signs: Reviewed and stable  Complications: No apparent anesthesia complications

## 2011-10-20 NOTE — OR Nursing (Signed)
Procedure 1 Bronchoscopy start at 0802 and end at 0805. Procedure 2 Left Thoracoscopy start at 0842 and end at 1100. 

## 2011-10-20 NOTE — Progress Notes (Signed)
SICU p.m. Rounds  Patient reviewed and examined at bedside Breathing comfortably following left thoracotomy and pulmonary resection O2 sats remained stable Minimal chest tube drainage, small airleak present Doing well

## 2011-10-20 NOTE — H&P (Signed)
[address]            [place] [address]          [phone]       [name] [facility] Medical Record #[id number] Date of Birth: 01-17-1933  Referring: [name] Primary Care: [name], MD  Chief Complaint:    Left lung mass   History of Present Illness:    Patient is a 78 year old male who for the pass 4-5 months has had complain of pleuritic-type chest pain involving his lower chest bilaterally. He also complains of some hoarseness that's been intermittent. He notes at least one admission and 4 emergency room visits for the same symptoms. Cardiac catheterization was then performed CT scan of the chest and PET scan is now referred to the thoracic service. He's had a history of pulmonary emboli in 2006 and has been on anticoagulation since that time. He is a long-term smoker more than 50 years and stopped 7 years ago he is currently using chewing tobacco.   Since the patient was seen last week a his anticoagulation has been held and a needle biopsy of the left upper lobe lung lesion was biopsied. Limited tissue was obtained but it does confirm non-small cell cancer, probably of lung origin. In addition the patient has seen Dr. [name] concerning the hypermetabolic area in the left parotid gland, this was not felt to be of clinical significance currently.   Current Activity/ Functional Status: Patient is independent with mobility/ambulation, transfers, ADL's, IADL's.   Past Medical History  Diagnosis Date  . Hyperlipidemia   . Hypertension   . Esophageal reflux   . Diverticulosis   . Rheumatic fever ~ 1944  . Vision loss of left eye     S/P "cataract OR"; "can see a little; not much"  . Kidney stones   . Hard of hearing, left   . Pulmonary embolism     On Xarelto  . Hypercalcemia   . Arthritis     "knees"  . CAD (coronary artery disease)     nonobstructive by cath 07/22/11 with small to moderate sized diagonal  branch with ostial stenosis , medical therapy advised  . Prostate cancer     s/p cryotherapy  . AV block     s/p PPM  . COPD (chronic obstructive pulmonary disease)   . Carotid artery aneurysm     Bilateral internal carotid artery aneurysms followed by Dr. Arbie Cookey  . Shortness of breath     with ambulation  . Constipation     Past Surgical History  Procedure Date  . Cystoscopy   . Lung surgery 1957    "born w/bleb in [place]  . Insert / replace / remove pacemaker 1991    initial placement  . Insert / replace / remove pacemaker ~ 2011    "changed out"  . Tonsillectomy and adenoidectomy 1943  . Appendectomy 2000's  . Cholecystectomy 2000's  . Cataract extraction w/ intraocular lens implant ~ 2008    left eye  . Lithotripsy ~ 2011    "twice"  . Cardiac catheterization   . Prostate cryoablation ~ 2008    Family History  Problem Relation Age of Onset  . Hypertension Neg Hx   . Hyperlipidemia Neg Hx   . Heart failure Neg Hx   .  Heart disease Neg Hx   . Colon cancer Neg Hx   . Breast cancer Mother   . Emphysema Sister     was a smoker    History   Social History  . Marital Status: Married    Spouse Name: N/A    Number of Children: 1  . Years of Education: N/A   Occupational History  . Retired     Naval architect    Social History Main Topics  . Smoking status: Former Smoker -- 1.0 packs/day for 50 years    Types: Cigarettes    Quit date: 03/23/2004  . Smokeless tobacco: Current User    Types: Chew  . Alcohol Use: No  . Drug Use: No  . Sexually Active: Not Currently   Other Topics Concern  . Not on file   Social History Narrative   2 caffeine drink daily     History  Smoking status  . Former Smoker -- 1.0 packs/day for 50 years  . Types: Cigarettes  . Quit date: 03/23/2004  Smokeless tobacco  . Current User  . Types: Chew    History  Alcohol Use No     Allergies  Allergen Reactions  . Iohexol      Desc: PT STATED TODAY HE GETS SLIGHT SOB FROM  IV CONTRAST. HE NEEDS FULL PREMEDS FROM NOW ON PER DR. MATTERN   . Penicillins Itching and Rash    "haven't took none in 40 years"    Current Facility-Administered Medications  Medication Dose Route Frequency Provider Last Rate Last Dose  . HYDROmorphone (DILAUDID) injection 0.25-0.5 mg  0.25-0.5 mg Intravenous Q5 min PRN [name], MD      . ondansetron [facility]) injection 4 mg  4 mg Intravenous Once PRN [name], MD      . vancomycin (VANCOCIN) IVPB 1000 mg/200 mL premix  1,000 mg Intravenous 60 min Pre-Op [name], MD       Facility-Administered Medications Ordered in Other Encounters  Medication Dose Route Frequency Provider Last Rate Last Dose  . lactated ringers infusion    Continuous PRN [name], [name]           Review of Systems:     Cardiac Review of Systems: Y or N  Chest Pain [ y   ]  Resting SOB [ n  ] Exertional SOB  [ n ]  Orthopnea [  n]   Pedal Edema [ n  ]    Palpitations [n  ] Syncope  [n  ]   Presyncope [  n ]  General Review of Systems: [Y] = yes [  ]=no Constitional: recent weight change [  ]; anorexia [  ]; fatigue [  ]; nausea [  ]; night sweats [  ]; fever [  ]; or chills [  ];  Dental: poor dentitionn[  ];   Eye : blurred vision [  ]; diplopia [   ]; vision changes [  ];  Amaurosis fugax[  ]; Resp: cough [  ];  wheezing[  ];  hemoptysis[  ]; shortness of breath[  ]; paroxysmal nocturnal dyspnea[y  ]; dyspnea on exertion[ y ]; or orthopnea[  ];  GI:  gallstones[  ], vomiting[  ];  dysphagia[  ]; melena[  ];  hematochezia [  ]; heartburn[  ];   Hx of  Colonoscopy[ y 3 years ago ]; GU: kidney stones [  ]; hematuria[ n ];   dysuria [  ];  nocturia[  ];  history of     obstruction [  ];             Skin: rash, swelling[  ];, hair loss[  ];  peripheral edema[  ];  or itching[  ]; Musculosketetal:  myalgias[y  ];  joint swelling[ n ];  joint erythema[n  ];  joint pain[  ];  back pain[y  ];  Heme/Lymph: bruising[ y ];  bleeding[  ];  anemia[  ];  Neuro: TIA[  ];  headaches[  ];  stroke[  ];  vertigo[  ];  seizures[  ];   paresthesias[  ];  difficulty walking[n  ];  Psych:depression[  ]; anxiety[  ];  Endocrine: diabetes[  ];  thyroid dysfunction[  ];  Immunizations: Flu [ y ]; Pneumococcal[y  ];  Other:  Physical Exam: BP 121/68  Pulse 61  Temp 98.6 F (37 C) (Tympanic)  Resp 96  SpO2 98%  General appearance: alert, cooperative, appears stated age and no distress Neurologic: intact Heart: regular rate and rhythm, S1, S2 normal, no murmur, click, rub or gallop and normal apical impulse Lungs: clear to auscultation bilaterally and normal percussion bilaterally Abdomen: soft, non-tender; bowel sounds normal; no masses,  no organomegaly Extremities: extremities normal, atraumatic, no cyanosis or edema and Homans sign is negative, no sign of DVT Wound: He has a formal left thoracotomy incision well healed I do not appreciate any cervical or supraclavicular adenopathy   Diagnostic Studies & Laboratory data:     Recent Radiology Findings:  Dg Chest 2 View  08/30/2011  *RADIOLOGY REPORT*  Clinical Data: Shortness of breath, numbness  CHEST - 2 VIEW  Comparison: 06/14/2011 and CT scan 08/18/2011  Findings: Cardiomediastinal silhouette is stable.  Dual lead cardiac pacemaker is unchanged in position.  Hyperinflation again noted. No acute infiltrate or pulmonary edema.  Stable osteopenia and degenerative changes thoracic spine.  Again noted 1.3 cm nodule in the left upper lobe.  Again further evaluation with PET scan is recommended to exclude malignancy.  IMPRESSION:  Hyperinflation again noted. No acute infiltrate or pulmonary edema.  Stable osteopenia and degenerative changes thoracic spine. Again noted 1.3 cm nodule in the left upper lobe.  Again further evaluation with PET scan is  recommended to exclude malignancy.  Original Report Authenticated By: Natasha Mead, M.D.   Ct Angio Neck W/cm &/or Wo/cm  08/25/2011  *RADIOLOGY REPORT*  Clinical Data:  78 year old male with history of skull base internal carotid artery pseudoaneurysms.  History of lung cancer, pacemaker, prostate cancer, contrast allergy.  CT ANGIOGRAPHY NECK  Technique:  Multidetector CT imaging of the neck was performed using the standard protocol during bolus administration of intravenous contrast.  Multiplanar CT image reconstructions including MIPs were obtained to evaluate the vascular anatomy. Carotid stenosis measurements (when applicable) are obtained utilizing NASCET criteria, using  the distal internal carotid diameter as the denominator.  Contrast: OMNIPAQUE IOHEXOL 350 MG/ML SOLN 13 hours steroid premedication done with no apparent adverse reaction at the time of imaging.  Comparison:  08/16/2009, 10/03/2008.  Chest CT 08/18/2011.  Findings:  Right chest cardiac pacemaker again noted.  Left posterior upper lobe pleural-based spiculated lesion as described on the recent chest CT.  Underlying emphysema and apical scarring.  No superior mediastinal lymphadenopathy.  Stable sub centimeter right thyroid nodules, inconsequential.  Negative larynx.  Stable pharynx.  Negative parapharyngeal, retropharyngeal and sublingual spaces.  Negative submandibular glands.  Stable parotid glands.  No cervical lymphadenopathy.  Advanced left cervical spine facet degeneration. Visualized paranasal sinuses and mastoids are clear. No acute osseous abnormality identified.  Grossly negative visualized brain parenchyma.  Vascular Findings: Three-vessel arch configuration with no arch atherosclerosis.  No great vessel origin stenosis.  Normal right common carotid artery is stable.  Right carotid bifurcation is stable widely patent with mild soft plaque. Tortuous distal cervical right ICA with a mostly fusiform type aneurysm is stable since  2011 measuring 8 x 8 x 9 mm (series 5 image 105, series 4082 image 69).  No stenosis.  Negative visualized right ICA siphon.  Mildly nondominant right vertebral artery has a normal origin. Right paravertebral venous contrast reflux noted similar to the prior exam.  Negative cervical right vertebral artery.  Pain right PICA.  Mildly dominant left vertebral artery has a normal origin.  Normal cervical left vertebral artery.  The distal left vertebral artery primarily supplies the basilar.  Stable and normal left common carotid artery and left carotid bifurcation except for mild soft and faintly calcified atherosclerotic plaque.  Tortuous cervical left ICA with a rim calcified mostly thrombosed pseudoaneurysm arising medially from the left ICA at the level of the C2 vertebral body (series 401 image 120, series 5 image 100.  This is stable with this lesion measuring about 11 mm in length.  Ectasia of the adjacent left ICA. More distal tortuosity and then just below the skull base there is a second partially calcified saccular pseudoaneurysm with persistent enhancement and a broad neck.  This is stable with the enhancing portion measuring 8 x 10 x 11 mm.  Thrombosis within the pseudoaneurysm continues to the foramen lacerum.  The visualized left ICA siphons otherwise negative.   Review of the MIP images confirms the above findings.  IMPRESSION: 1.  Stable bilateral cervical ICA pseudoaneurysms since 2011 as detailed above. 2.  Mild carotid bifurcation atherosclerosis.  No atherosclerotic stenosis in the neck. 3.  Emphysema.  The left apical pleural based lung lesion, see chest CT report 08/18/2011.  Original Report Authenticated By: Harley Hallmark, M.D.   Ct Chest Wo Contrast  08/18/2011  *RADIOLOGY REPORT*  Clinical Data: Bilateral rib pain and soreness for 2 months, history of prostate carcinoma, former smoking history  CT CHEST WITHOUT CONTRAST  Technique:  Multidetector CT imaging of the chest was performed  following the standard protocol without IV contrast.  Comparison: Chest x-ray of 06/14/2011  Findings: Severe centrilobular emphysema is noted particularly involving the upper lobes.  Within the left upper lobe, there is a pleural based opacity posteriorly of 1.3 x 1.1 x 1.8 cm.  PET-CT is recommended to assess for metabolic activity versus close follow-up CT in 3-4 months to assess stability.  No other suspicious pulmonary nodule or mass is seen.  No pleural effusion is noted. On bone window images no abnormality of the ribs is seen.  On soft tissue window images, probable small thyroid nodules are present bilaterally.  No mediastinal or hilar adenopathy is seen. The ascending aorta measures 4.3 cm in diameter.  There is cardiomegaly present.  A permanent pacemaker is noted.  An exophytic lesion emanates from the upper pole of the right kidney medially measuring 1.6 x 1.9 cm with an attenuation of 14 HU most consistent with complex cyst.  Several other more simple appearing right renal cysts are noted as well.  An exophytic structure in the mid left kidney also is most consistent with cyst.  IMPRESSION:  1.  Pleural based nodular lesion in the posterior left upper hemithorax of 1.3 x 1.1 x 1.8 cm.  Consider PET CT for more acute workup versus close follow-up CT of the chest in 3-4 months. 2.  Centrilobular emphysema. 3.  No abnormality of the ribs is seen. 4.  Fusiform dilatation of the ascending aorta with maximum diameter of 4.3 cm.  Original Report Authenticated By: Juline Patch, M.D.   Nm Pet Image Initial (pi) Skull Base To Thigh  09/03/2011  *RADIOLOGY REPORT*  Clinical Data: Initial treatment strategy for lung nodule.  NUCLEAR MEDICINE PET SKULL BASE TO THIGH  Fasting Blood Glucose:  99  Technique:  19.2 mCi F-18 FDG was injected intravenously. CT data was obtained and used for attenuation correction and anatomic localization only.  (This was not acquired as a diagnostic CT examination.) Additional exam  technical data entered on technologist worksheet.  Comparison:  CT chest 08/18/2011 and CT neck 08/25/2011.  Findings:  Neck: Focal hypermetabolism is seen in the left parotid, with an S U V max of 5.3.  There is a 7 mm nodule within the left parotid gland, image 29.  No additional areas of abnormal hypermetabolism in the neck.  CT images show no acute findings.  Chest:  An irregular subpleural left upper lobe nodule measures 1.2 x 1.3 cm and has an S U V max of 2.2.  No additional areas of abnormal hypermetabolism in the chest.  CT images show no acute findings.  A 3.9 x 5.6 cm low attenuation lesion in the subcutaneous tissues of the upper right paraspinal region is likely a sebaceous cyst.  No pericardial or pleural effusion. Emphysema.  Abdomen/Pelvis:  No abnormal hypermetabolic activity within the liver, pancreas, adrenal glands, or spleen.  No hypermetabolic lymph nodes in the abdomen or pelvis.  CT images show no acute findings.  A stone is seen in the lower pole left kidney.  Atherosclerotic calcification of the arterial vasculature without abdominal aortic aneurysm.  Bladder wall appears thickened but the bladder is under distended. Calcifications are seen in the prostate.  [place]:  No focal hypermetabolic activity to suggest skeletal metastasis.  IMPRESSION:  1.  Irregular subpleural left upper lobe nodule is mildly hypermetabolic and therefore worrisome for a low grade adenocarcinoma.  If this is a non-small cell lung cancer, it is most consistent with a stage IA lesion. Consultation with the [facility] [phone]) should be considered. 2.  Small nodule the left parotid gland with associated hypermetabolism.  Finding is nonspecific. 3.  Left renal stone.  Original Report Authenticated By: Reyes Ivan, M.D.    Recent Lab Findings: Lab Results  Component Value Date   WBC 7.1 10/16/2011   HGB 15.5 10/16/2011   HCT 42.9 10/16/2011   PLT 221 10/16/2011    GLUCOSE 96 10/16/2011   CHOL 192 07/22/2011   TRIG 76 07/22/2011  HDL 57 07/22/2011   LDLCALC 161* 07/22/2011   ALT 11 10/16/2011   AST 16 10/16/2011   NA 140 10/16/2011   K 3.9 10/16/2011   CL 107 10/16/2011   CREATININE 0.91 10/16/2011   BUN 7 10/16/2011   CO2 23 10/16/2011   TSH 4.001 06/13/2011   INR 0.97 10/16/2011   HGBA1C 5.0 06/13/2011   PFTS done FEV1 2.19 72% with 10% improvement with bronchodilators, DLCO 80 % 0n 09/14/2011  PATH: [id number] Diagnosis LUNG, FINE NEEDLE ASPIRATION, LEFT: MALIGNANT CELLS PRESENT SEE COMMENT COMMENT: THE CYTOLOGY IS THAT OF NON-SMALL CELL CARCINOMA. HOWEVER, THERE IS VERY SCANT CELL BLOCK MATERIAL PRESENT FOR IMMUNOPHENOTYPING. IN THE SECTIONS TAKEN, THERE IS VERY FOCAL TTF-1 EXPRESSION AND ABSENCE OF PSA EXPRESSION. ALTHOUGH THE IMMUNOPHENOTYPE IS SUGGESTIVE OF PRIMARY LUNG ADENOCARCINOMA, THE SCANT TUMOR PRESENT MAKES A DEFINITIVE DIAGNOSIS NOT POSSIBLE. THE CASE WAS REVIEWED WITH DR Colonel Bald, WHO CONCURS. Preliminary Diagnosis REQUEST MOREMATERIAL FOR DEFINITIVE DIAGNOSIS (RH) [place] RUND DO Pathologist, Electronic Signature (Case signed 09/23/2011)  Recent Cath: HISASHI AMADON  [id number]  5/1/201312:07 PM  [name], MD, MD  Procedure Performed:  1. Left Heart Catheterization 2. Selective Coronary Angiography 3. Left ventricular angiogram Operator: Verne Carrow, MD  Arterial access site: Right radial artery.  Indication: Chest pain known CAD.  Procedure Details:  The risks, benefits, complications, treatment options, and expected outcomes were discussed with the patient. The patient and/or family concurred with the proposed plan, giving informed consent. The patient was brought to the cath lab after IV hydration was begun and oral premedication was given. The patient was further sedated with Versed. The right wrist was assessed with an Allens test which was positive. The right wrist was prepped and draped in a sterile fashion. 1%  lidocaine was used for local anesthesia. Using the modified Seldinger access technique, a 5 French sheath was placed in the right radial artery. 1.25 mg Nicardipine was given through the sheath. 3500 units IV heparin was given. Standard diagnostic catheters were used to perform selective coronary angiography. A pigtail catheter was used to perform a left ventricular angiogram. The sheath was removed from the right radial artery and a Terumo hemostasis band was applied at the arteriotomy site on the right wrist.  There were no immediate complications. The patient was taken to the recovery area in stable condition.  Hemodynamic Findings:  Central aortic pressure: 138/77  Left ventricular pressure: 133/9/13  Angiographic Findings:  Left main: No obstructive disease.  Left Anterior Descending Artery: Large caliber vessel that courses to the apex. The mid vessel has 30% stenosis at the takeoff of the small to moderate sized diagonal branch. The diagonal branch is a small to moderate sized vessel with 70% ostial stenosis.  Circumflex Artery: Moderate sized intermediate branch with mild plaque disease. The Circumflex system is small to moderate sized with mild plaque.  Right Coronary Artery: Large dominant artery with mild plaque throughout the proximal and mid vessel.  Left Ventricular Angiogram: LVEF=55%.  Impression:  1. Mild to moderate non-obstructive disease in LAD, Circumflex and RCA  2. Ostial stenosis of small to moderate sized diagonal branch. This is a difficult location for PCI given the angle and ostial location.  3. Preserved LV systolic function.  Recommendations: I would recommend continued medical management at this time. I will add Imdur. Restart Xarelto tonight at 6pm.  Complications: None. The patient tolerated the procedure well.    Assessment / Plan:    Patient is a 78 year old male with unexplained  bilateral lower chest pain also with some hoarseness who has had extensive  evaluation and multiple emergency room visits. A CT scan and PET scan show a 1.3 cm borderline hypermetabolic lesion in the left upper lobe of the lung. There is now evidence of non-small cell cancer in the lesion noted on PET scan.  I recommended to the patient that we proceed with left video-assisted thoracoscopy possible thoracotomy and lung resection for biopsy proven left lung malignancy. The patient in his 30s had a previous left thoracotomy for what he described as "an underdeveloped lung". The previous thoracotomy will obviously complicate the resection of his left lung. The risks and options were discussed with he and his wife in detail, he understands that with what appears to be early stage lung cancer surgical resection offers the best chance of cure and he is willing to proceed.  The patient has been on anticoagulation for a pulmonary embolus more than 10 years ago.  This was stopped prior to the needle biopsy and but was not be resumed afterwards. He will stay off anticoagulation until we proceed with surgical resection Tuesday July 29.  The goals risks and alternatives of the planned surgical procedure bronchoscopy, left VATS possible thoracotomy and lung resection have been discussed with the patient in detail. The risks of the procedure including death, infection, stroke, myocardial infarction, bleeding, blood transfusion have all been discussed specifically.  I have quoted [name] a 4 % of perioperative mortality and a complication rate as high as 15 %. The patient's questions have been answered.[name] is willing  to proceed with the planned procedure.    [name] MD  Beeper [phone] Office [phone] 10/20/2011 7:30 AM

## 2011-10-20 NOTE — Anesthesia Procedure Notes (Addendum)
Procedure Name: Intubation Date/Time: 10/20/2011 7:54 AM Performed by: [name] Pre-anesthesia Checklist: Emergency Drugs available, Patient identified, Timeout performed, Patient being monitored and Suction available Patient Re-evaluated:Patient Re-evaluated prior to inductionOxygen Delivery Method: Circle system utilized Preoxygenation: Pre-oxygenation with 100% oxygen Intubation Type: IV induction Ventilation: Mask ventilation without difficulty and Oral airway inserted - appropriate to patient size Laryngoscope Size: Mac and 3 Grade View: Grade I Tube type: Oral Tube size: 8.5 mm Airway Equipment and Method: Stylet Placement Confirmation: ETT inserted through vocal cords under direct vision and breath sounds checked- equal and bilateral Secured at: 21 cm Tube secured with: Tape Dental Injury: Teeth and Oropharynx as per pre-operative assessment    Performed by: [name] Laryngoscope Size: Mac and 3 Grade View: Grade I Endobronchial tube: Left, Double lumen EBT and EBT position confirmed by auscultation and 39 Fr Number of attempts: 1 Airway Equipment and Method: Stylet Placement Confirmation: ETT inserted through vocal cords under direct vision,  breath sounds checked- equal and bilateral and positive ETCO2 Tube secured with: Tape Dental Injury: Teeth and Oropharynx as per pre-operative assessment

## 2011-10-20 NOTE — OR Nursing (Signed)
Procedure 1 Bronchoscopy start at 0802 and end at 0805. Procedure 2 Left Thoracoscopy start at 0842 and end at 1100.

## 2011-10-20 NOTE — Care Management Note (Unsigned)
    Page 1 of 2   10/28/2011     10:52:44 AM   CARE MANAGEMENT NOTE 10/28/2011  Patient:  [name], [name]   Account Number:  [id number]  Date Initiated:  10/20/2011  Documentation initiated by:  Junius Creamer  Subjective/Objective Assessment:   adm w lung mass left, left vats     Action/Plan:   lives w wife, pcp dr Andrey Campanile elkins   Anticipated DC Date:  10/24/2011   Anticipated DC Plan:  HOME W HOME HEALTH SERVICES      DC Planning Services  CM consult      [facility] Choice  HOME HEALTH   Choice offered to / List presented to:  C-1 Patient        HH arranged  HH-1 RN      [facility] agency  [facility]   Status of service:  In process, will continue to follow Medicare Important Message given?   (If response is "NO", the following Medicare IM given date fields will be blank) Date Medicare IM given:   Date Additional Medicare IM given:    Discharge Disposition:    Per UR Regulation:  Reviewed for med. necessity/level of care/duration of stay  If discussed at Long Length of Stay Meetings, dates discussed:   10/27/2011    Comments:  10/28/11  1048  Carlyon Nolasco SIMMONS RN, BSN [phone] REFERRAL PLACED TO [facility] WITH [facility] FOR HHRN PER CHOICE; FOR POSSIBLE DISCHARGE TODAY WITH [facility]; SOC DATE: WITHIN 24-48HRS POST DISCHARGE.  10/27/11  1129  Almando Brawley SIMMONS RN, BSN [phone] 7 DAYS POST VATS; CHEST TUBE REMAINS, POSITIVE AIR LEAK WITH COUGH- HOPEFUL TO GET CHEST TUBE OUT SOON OR CONNECT TO [facility]-  AWAITING MD DECISION;  NCM WILL FOLLOW.  10/21/11 JULIE AMERSON,RN,BSN 1400 MET WITH PT AND WIFE TO DISCUSS DC PLANS.  WIFE STATES SHE CAN PROVIDE 24HR CARE AT DC.  WILL FOLLOW FOR HOME NEEDS AS PT PROGRESSES.  7/30 13:30 debbie dowell rn,bsn  [phone]

## 2011-10-20 NOTE — Anesthesia Postprocedure Evaluation (Signed)
  Anesthesia Post-op Note  Patient: [name]  Procedure(s) Performed: Procedure(s) (LRB): VIDEO ASSISTED THORACOSCOPY (VATS)/THOROCOTOMY (Left) VIDEO BRONCHOSCOPY (N/A)  Patient Location: PACU  Anesthesia Type: General  Level of Consciousness: awake, alert , oriented and patient cooperative  Airway and Oxygen Therapy: Patient Spontanous Breathing and Patient connected to nasal cannula oxygen  Post-op Pain: mild  Post-op Assessment: Post-op Vital signs reviewed, Patient's Cardiovascular Status Stable, Respiratory Function Stable, Patent Airway, No signs of Nausea or vomiting and Pain level controlled  Post-op Vital Signs: stable  Complications: No apparent anesthesia complications

## 2011-10-20 NOTE — OR Nursing (Signed)
Charting under Arlyss Queen, RNFA from 1020 to 1117 was completed by Lanell Matar, RN

## 2011-10-20 NOTE — Preoperative (Signed)
Beta Blockers   Reason not to administer Beta Blockers:Not Applicable 

## 2011-10-20 NOTE — Anesthesia Preprocedure Evaluation (Signed)
Anesthesia Evaluation  Patient identified by MRN, date of birth, ID band Patient awake    Reviewed: Allergy & Precautions, H&P , NPO status , Patient's Chart, lab work & pertinent test results  Airway Mallampati: I TM Distance: >3 FB Neck ROM: full    Dental   Pulmonary shortness of breath, COPD         Cardiovascular hypertension, + CAD and + Peripheral Vascular Disease + dysrhythmias + pacemaker Rhythm:regular Rate:Normal     Neuro/Psych    GI/Hepatic GERD-  ,  Endo/Other    Renal/GU      Musculoskeletal   Abdominal   Peds  Hematology   Anesthesia Other Findings   Reproductive/Obstetrics                           Anesthesia Physical Anesthesia Plan  ASA: III  Anesthesia Plan: General   Post-op Pain Management:    Induction: Intravenous  Airway Management Planned: Double Lumen EBT  Additional Equipment: Arterial line and CVP  Intra-op Plan:   Post-operative Plan: Possible Post-op intubation/ventilation  Informed Consent: I have reviewed the patients History and Physical, chart, labs and discussed the procedure including the risks, benefits and alternatives for the proposed anesthesia with the patient or authorized representative who has indicated his/her understanding and acceptance.     Plan Discussed with: [name], Anesthesiologist and Surgeon  Anesthesia Plan Comments:         Anesthesia Quick Evaluation

## 2011-10-20 NOTE — Brief Op Note (Addendum)
                   [address]            [place] [address]          [phone]    10/20/2011  10:59 AM  PATIENT:  [name]  78 y.o. male  PRE-OPERATIVE DIAGNOSIS: left  lung mass needle bx proven carcinoma  POST-OPERATIVE DIAGNOSIS: same  PROCEDURE:  Procedure(s): VIDEO ASSISTED THORACOSCOPY (VATS)/THOROCOTOMY, LLL SUPERIOR SEGMENT WEDGE RESECTION VIDEO BRONCHOSCOPY  SURGEON:  Surgeon(s): [name], MD  PHYSICIAN ASSISTANT: WAYNE GOLD PA-C  ANESTHESIA:   general  SPECIMEN:  Source of Specimen:  AS ABOVE  DISPOSITION OF SPECIMEN:  Pathology  DRAINS: 1 Chest Tube(s) in the LEFT CHEST   PATIENT CONDITION:  PACU - hemodynamically stable.  PRE-OPERATIVE WEIGHT: 73kg  COMPLICATIONS : NO KNOWN  FROZEN + CARCINOMA

## 2011-10-21 ENCOUNTER — Inpatient Hospital Stay (HOSPITAL_COMMUNITY): Payer: Medicare Other

## 2011-10-21 ENCOUNTER — Encounter (HOSPITAL_COMMUNITY): Payer: Self-pay | Admitting: Cardiothoracic Surgery

## 2011-10-21 LAB — POCT I-STAT 3, ART BLOOD GAS (G3+)
Acid-Base Excess: 1 mmol/L (ref 0.0–2.0)
Bicarbonate: 26.6 mEq/L — ABNORMAL HIGH (ref 20.0–24.0)
O2 Saturation: 95 %
Patient temperature: 98.2
TCO2: 28 mmol/L (ref 0–100)
pCO2 arterial: 44.1 mmHg (ref 35.0–45.0)
pH, Arterial: 7.388 (ref 7.350–7.450)
pO2, Arterial: 79 mmHg — ABNORMAL LOW (ref 80.0–100.0)

## 2011-10-21 LAB — BASIC METABOLIC PANEL
CO2: 27 mEq/L (ref 19–32)
Chloride: 106 mEq/L (ref 96–112)
Sodium: 138 mEq/L (ref 135–145)

## 2011-10-21 LAB — CBC
Hemoglobin: 12.1 g/dL — ABNORMAL LOW (ref 13.0–17.0)
MCV: 90.8 fL (ref 78.0–100.0)
Platelets: 173 10*3/uL (ref 150–400)
RBC: 3.91 MIL/uL — ABNORMAL LOW (ref 4.22–5.81)
WBC: 7.6 10*3/uL (ref 4.0–10.5)

## 2011-10-21 MED ORDER — ENOXAPARIN SODIUM 40 MG/0.4ML ~~LOC~~ SOLN
40.0000 mg | SUBCUTANEOUS | Status: DC
  Administered 2011-10-21 – 2011-10-27 (×7): 40 mg via SUBCUTANEOUS
  Filled 2011-10-21 (×11): qty 0.4

## 2011-10-21 MED ORDER — SODIUM CHLORIDE 0.9 % IV SOLN: INTRAVENOUS | Status: DC

## 2011-10-21 NOTE — Progress Notes (Signed)
Patient ID: [name], male   DOB: 24-Oct-1932, 78 y.o.   MRN: [id number] TCTS DAILY PROGRESS NOTE                   [address]            [place] [address]          [phone]      1 Day Post-Op Procedure(s) (LRB): VIDEO ASSISTED THORACOSCOPY (VATS)/THOROCOTOMY (Left) VIDEO BRONCHOSCOPY (N/A)  Total Length of Stay:  LOS: 1 day   Subjective: Feels well good pain control  Objective: Vital signs in last 24 hours: Temp:  [97.5 F (36.4 C)-98.4 F (36.9 C)] 98.2 F (36.8 C) (07/31 0353) Pulse Rate:  [55-73] 72  (07/31 0600) Cardiac Rhythm:  [-] Atrial paced (07/31 0353) Resp:  [7-20] 20  (07/31 0600) BP: (112-150)/(53-81) 140/68 mmHg (07/31 0600) SpO2:  [98 %-100 %] 100 % (07/31 0600) Arterial Line BP: (123-196)/(46-87) 196/87 mmHg (07/31 0600) Weight:  [163 lb 9.3 oz (74.2 kg)] 163 lb 9.3 oz (74.2 kg) (07/31 0700)  Filed Weights   10/21/11 0700  Weight: 163 lb 9.3 oz (74.2 kg)    Weight change:    Hemodynamic parameters for last 24 hours:    Intake/Output from previous day: 07/30 0701 - 07/31 0700 In: 4256 [P.O.:150; I.V.:3606; IV Piggyback:500] Out: 2325 [Urine:1985; Blood:75; Chest Tube:265]  Intake/Output this shift:    Current Meds: Scheduled Meds:   . acetaminophen  1,000 mg Intravenous Q6H  . bisacodyl  10 mg Oral Daily  . bupivacaine 0.25 % ON-Q pump SINGLE CATH 400 mL  400 mL Other To OR  . DULoxetine  60 mg Oral Daily  . fentaNYL   Intravenous Q4H  . multivitamin with minerals  1 tablet Oral Daily  . vancomycin  1,000 mg Intravenous 60 min Pre-Op  . vancomycin  1,000 mg Intravenous Q12H  . DISCONTD: cefUROXime (ZINACEF)  IV  1.5 g Intravenous Q12H  . DISCONTD: multivitamin  1 tablet Oral Daily   Continuous Infusions:   . bupivacaine ON-Q pain pump    . dextrose 5 % and 0.9% NaCl 100 mL/hr (10/20/11 2346)  . DISCONTD: sodium chloride     PRN Meds:.albuterol, diphenhydrAMINE, diphenhydrAMINE, naloxone, ondansetron  (ZOFRAN) IV, ondansetron (ZOFRAN) IV, oxyCODONE, oxyCODONE-acetaminophen, oxyCODONE-acetaminophen, potassium chloride, senna-docusate, sodium chloride, traMADol, DISCONTD: 0.9 % irrigation (POUR BTL), DISCONTD: albuterol, DISCONTD: bupivacaine-EPINEPHrine, DISCONTD: hemostatic agents, DISCONTD:  HYDROmorphone (DILAUDID) injection, DISCONTD: ondansetron (ZOFRAN) IV  General appearance: alert, cooperative and no distress Neurologic: intact Heart: regular rate and rhythm, S1, S2 normal, no murmur, click, rub or gallop and normal apical impulse Lungs: clear to auscultation bilaterally and normal percussion bilaterally Abdomen: soft, non-tender; bowel sounds normal; no masses,  no organomegaly Extremities: extremities normal, atraumatic, no cyanosis or edema and Homans sign is negative, no sign of DVT air leak with cough from ct  Lab Results: CBC: Basename 10/21/11 0500  WBC 7.6  HGB 12.1*  HCT 35.5*  PLT 173   BMET:  Basename 10/21/11 0500  NA 138  K 3.6  CL 106  CO2 27  GLUCOSE 110*  BUN 8  CREATININE 0.76  CALCIUM 9.6    PT/INR: No results found for this basename: LABPROT,INR in the last 72 hours Radiology: Dg Chest Portable 1 View  10/20/2011  *RADIOLOGY REPORT*  Clinical Data: Postop VATS  PORTABLE CHEST - 1 VIEW  Comparison: 10/16/2011  Findings: 1140 hours.  Left chest tube without evidence for pneumothorax.  The suture line is seen in the left.  There is some bibasilar atelectasis.  Left IJ central line tip projects at the innominate vein confluence.  Left dual lead permanent pacemaker again noted.  IMPRESSION: No definite left pneumothorax status post left VATS.  Original Report Authenticated By: ERIC A. MANSELL, M.D.     Assessment/Plan: S/P Procedure(s) (LRB): VIDEO ASSISTED THORACOSCOPY (VATS)/THOROCOTOMY (Left) with left lower lobe segmentectomy VIDEO BRONCHOSCOPY (N/A) Mobilize Diuresis d/c tubes/lines Plan for transfer to step-down: see transfer  orders     [name] MD  Beeper [phone] Office [phone] 10/21/2011 7:20 AM

## 2011-10-21 NOTE — Op Note (Signed)
NAMEJERRAN, [name]                ACCOUNT NO.:  [id number]  MEDICAL RECORD NO.:  [id number]  LOCATION:  2016                         FACILITY:  [facility]  PHYSICIAN:  Sheliah Plane, MD    DATE OF BIRTH:  10-25-1932  DATE OF PROCEDURE:  10/20/2011 DATE OF DISCHARGE:                              OPERATIVE REPORT Preop: Non small cell Lung cancer- left Postop: same SURGICAL PROCEDURE:   Repeat left Mini thoracotomy, wedge resection, and segmentectomy of the superior segment of the left lower lobe, and placement of On-Q device.  SURGEON:  Sheliah Plane, MD  FIRST ASSISTANT:  Rowe Clack, P.A.-C.  BRIEF HISTORY:  The patient is a 78 year old male, former smoker, who presented with gait bilateral flank pain, leading to a CT scan, chest x- ray and CT scan of the chest revealing approximately 1 cm nodule in the upper portion of the left lung peripherally based posteriorly.  The PET scan was performed as were PFTs.  The patient had a previous history of a left full thoracotomy at age 70 for which he described as a underdeveloped lung detail records of this were not available but was presumed to a resection for sequestration.  The fine-needle aspirate was performed on the mass and confirmed non-small cell carcinoma.  The patient's underlying pulmonary function was slightly depressed and he had significant honeycombing changes throughout both lung fields. Surgical resection was recommended and the patient agreed and signed informed consent.  DESCRIPTION OF PROCEDURE:  After appropriate time-out, the patient underwent general endotracheal anesthesia, initially with single-lumen endotracheal tube.  Through this, endotracheal tube, a fiberoptic bronchoscope was passed to the subsegmental level bilaterally.  There were no endobronchial lesions appreciated on this examination.  In addition, we did not appreciate any abnormalities of the tracheobronchial tree branching on examination.  The  scope was removed, a double-lumen endotracheal tube was then placed, and the patient was turned in lateral decubitus position with the left side up.  Because of the concern of the previous surgery that had been performed and adhesions we initially started with a small incision size of the port and dissected down to the pleura.  In doing this, it became apparent that the pleural space was totally obliterated slightly a larger small thoracotomy incision was made and through this, and after a tedious dissection, the lung was freed up, working posteriorly in the vicinity of the known lung nodule.  Portion of this dissection was subpleural and a small paddle of pleura was removed and it was attached to the portion of the lung where the lung mass was.  As we dissected further, the fissure between the upper and lower lobe became apparent and the mass was in fact in the superior segment of the left lower lobe.  Dissection was carried far enough to remove the superior segment of the left lower lobe.  An angled vascular clamp was placed across the lung with at least a 1-cm margin around the mass.  Below this clamp 2 firings of Endostapler with reinforcing strips carried out affectively removing the superior segment of the left lower lobe.  Specimen was submitted to pathology and confirmed the presence of the tumor of  non-small cell carcinoma, and with free margins.  As noted, the patient had significant honeycombing of the lung and with this dissection.  There was air leak noted on ventilation with the lung not being ventilated, Progel spray was applied to this area to cut down on the amount of air leak.  A single-angled 28 chest tube was brought out through a separate site anteriorly and positioned in the area of the superior segment was resected.  The lung was reinflated and On-Q device catheter was then placed also through a separate site and along the incision deep to the muscles taking care  not to snare the catheter as we closed.  The lung inflated nicely.  Because of the patient's underlying lung disease, very friable lung with significant honeycombing and the dense obliteration of the total pleural space, we did not attempt to dissect into the mediastinum to sample lymph nodes.  Radiographic staging was no involvement of the lymph nodes in the mediastinum noted.  Estimated blood loss was approximately 100 mL.  The incision was closed with interrupted 0 Vicryl, the muscle layers running 2-0 Vicryl, subcutaneous tissue 3-0 subcuticular stitch on skin edges.  Dermabond was applied. The patient was awakened and extubated in the operating room having tolerated the procedure without obvious complication.  Sponge and needle count was reported as correct.     Sheliah Plane, MD     EG/MEDQ  D:  10/21/2011  T:  10/21/2011  Job:  [id number]

## 2011-10-22 ENCOUNTER — Inpatient Hospital Stay (HOSPITAL_COMMUNITY): Payer: Medicare Other

## 2011-10-22 LAB — COMPREHENSIVE METABOLIC PANEL
Alkaline Phosphatase: 80 U/L (ref 39–117)
BUN: 9 mg/dL (ref 6–23)
Calcium: 10.4 mg/dL (ref 8.4–10.5)
GFR calc Af Amer: >90 mL/min (ref >90)
Glucose, Bld: 96 mg/dL (ref 70–99)
Total Protein: 6.2 g/dL (ref 6.0–8.3)

## 2011-10-22 LAB — CBC
HCT: 37.3 % — ABNORMAL LOW (ref 39.0–52.0)
Hemoglobin: 13 g/dL (ref 13.0–17.0)
MCH: 31.2 pg (ref 26.0–34.0)
MCHC: 34.9 g/dL (ref 30.0–36.0)
MCV: 89.4 fL (ref 78.0–100.0)

## 2011-10-22 MED ORDER — MAGNESIUM CITRATE PO SOLN
300.0000 mL | Freq: Once | ORAL | Status: AC
  Administered 2011-10-22: 300 mL via ORAL
  Filled 2011-10-22: qty 592

## 2011-10-22 NOTE — Progress Notes (Signed)
Pt ambulated 150 x 2, and x1 person assist pt tolerated well. Will monitor patient. Israella Hubert, Randall An RN

## 2011-10-22 NOTE — Progress Notes (Addendum)
2 Days Post-Op Procedure(s) (LRB): VIDEO ASSISTED THORACOSCOPY (VATS)/THOROCOTOMY (Left) VIDEO BRONCHOSCOPY (N/A)  Subjective: Mr. Mcchristian has complaints of his central line poking him.  He wants it taken out.  He has not had a bowel movement since last sunday  Objective: Vital signs in last 24 hours: Temp:  [98.1 F (36.7 C)-98.4 F (36.9 C)] 98.2 F (36.8 C) (08/01 0416) Pulse Rate:  [63-91] 91  (08/01 0416) Cardiac Rhythm:  [-] Atrial paced;Heart block (07/31 2100) Resp:  [12-20] 18  (08/01 0416) BP: (128-168)/(56-77) 144/75 mmHg (08/01 0416) SpO2:  [95 %-100 %] 96 % (08/01 0416) Arterial Line BP: (155-167)/(60-68) 167/68 mmHg (07/31 1100)  Intake/Output from previous day: 07/31 0701 - 08/01 0700 In: 1020 [P.O.:880; I.V.:140] Out: 2275 [Urine:2175; Chest Tube:100]  General appearance: alert, cooperative and no distress Heart: regular rate and rhythm Lungs: clear to auscultation bilaterally Abdomen: soft, non-tender; bowel sounds normal; no masses,  no organomegaly Extremities: edema tracve Wound: clean and dry  Lab Results:  Basename 10/22/11 0545 10/21/11 0500  WBC 9.4 7.6  HGB 13.0 12.1*  HCT 37.3* 35.5*  PLT 206 173   BMET:  Basename 10/22/11 0545 10/21/11 0500  NA 137 138  K 3.8 3.6  CL 102 106  CO2 27 27  GLUCOSE 96 110*  BUN 9 8  CREATININE 0.81 0.76  CALCIUM 10.4 9.6    PT/INR: No results found for this basename: LABPROT,INR in the last 72 hours ABG    Component Value Date/Time   PHART 7.388 10/21/2011 0514   HCO3 26.6* 10/21/2011 0514   TCO2 28 10/21/2011 0514   O2SAT 95.0 10/21/2011 0514   CBG (last 3)  No results found for this basename: GLUCAP:3 in the last 72 hours  Assessment/Plan: S/P Procedure(s) (LRB): VIDEO ASSISTED THORACOSCOPY (VATS)/THOROCOTOMY (Left) VIDEO BRONCHOSCOPY (N/A)  1. Chest tube- remains in place on water seal, + air leak, 125cc output last 24 hours, no evidence of pneumothorax on CXR 2. Constipation- patient uses prune  juice at home, will try that first if no relief will add other agent 3. Dispo- + air leak on chest tube, will leave in place today, discuss removal of central line with Dr. Tyrone Sage   LOS: 2 days    Lowella Dandy 10/22/2011   I have seen and examined [name] and agree with the above assessment  and plan.  [name] MD Beeper [phone] Office [phone] 10/22/2011 3:27 PM   Very small air leak today, ct to water seal I have seen and examined [name] and agree with the above assessment  and plan.  [name] MD Beeper [phone] Office [phone] 10/22/2011 3:27 PM

## 2011-10-22 NOTE — Progress Notes (Signed)
Fentanyl PCA changed out with 2 RNs, and wasted 49mcg/3ml of previous syringe. With 2 rns, 2nd RN Russ Halo. Malania Gawthrop, Randall An rN

## 2011-10-23 ENCOUNTER — Inpatient Hospital Stay (HOSPITAL_COMMUNITY): Payer: Medicare Other

## 2011-10-23 MED ORDER — TRAMADOL HCL 50 MG PO TABS: 50.0000 mg | ORAL_TABLET | Freq: Four times a day (QID) | ORAL | Status: AC | PRN

## 2011-10-23 NOTE — Progress Notes (Signed)
Pt ambulated 300 ft with walker and family member. Pt tolerated well, no complaints of pain or SOB. Will continue to monitor.

## 2011-10-23 NOTE — Progress Notes (Signed)
Central line dcd as ordered tip intact. Pressure held and dressing applied.  ON Q DCd also  Tip intact pt tolerated well. , Also pca DCd as ordered this am. Will continue to monitor patient. Nasreen Goedecke, Randall An RN

## 2011-10-23 NOTE — Progress Notes (Signed)
Pt ambulated in hallwayx2 with family, and RW tolerated well will montior patient. Sylvie Mifsud, Randall An RN

## 2011-10-23 NOTE — Progress Notes (Addendum)
3 Days Post-Op Procedure(s) (LRB): VIDEO ASSISTED THORACOSCOPY (VATS)/THOROCOTOMY (Left) VIDEO BRONCHOSCOPY (N/A)  Subjective: [name] has no complaints this morning.  He does ask if his PCA can be discontinued because he is not using it.  +BM  yesterday  Objective: Vital signs in last 24 hours: Temp:  [97.8 F (36.6 C)-98.4 F (36.9 C)] 98.4 F (36.9 C) (08/02 0700) Pulse Rate:  [74-94] 80  (08/02 0700) Cardiac Rhythm:  [-] Heart block (08/01 1930) Resp:  [12-20] 19  (08/02 0700) BP: (130-152)/(69-79) 130/75 mmHg (08/02 0700) SpO2:  [94 %-98 %] 94 % (08/02 0700)   Intake/Output from previous day: 08/01 0701 - 08/02 0700 In: 600 [P.O.:600] Out: 1 [Stool:1]  General appearance: alert, cooperative and no distress Heart: regular rate and rhythm Lungs: clear to auscultation bilaterally Abdomen: soft, non-tender; bowel sounds normal; no masses,  no organomegaly Extremities: edema trace Wound: clean and ry  Lab Results:  Basename 10/22/11 0545 10/21/11 0500  WBC 9.4 7.6  HGB 13.0 12.1*  HCT 37.3* 35.5*  PLT 206 173   BMET:  Basename 10/22/11 0545 10/21/11 0500  NA 137 138  K 3.8 3.6  CL 102 106  CO2 27 27  GLUCOSE 96 110*  BUN 9 8  CREATININE 0.81 0.76  CALCIUM 10.4 9.6    PT/INR: No results found for this basename: LABPROT,INR in the last 72 hours ABG    Component Value Date/Time   PHART 7.388 10/21/2011 0514   HCO3 26.6* 10/21/2011 0514   TCO2 28 10/21/2011 0514   O2SAT 95.0 10/21/2011 0514   CBG (last 3)  No results found for this basename: GLUCAP:3 in the last 72 hours  Assessment/Plan: S/P Procedure(s) (LRB): VIDEO ASSISTED THORACOSCOPY (VATS)/THOROCOTOMY (Left) VIDEO BRONCHOSCOPY (N/A)  1. Chest tube in place on water seal- + air leak with cough, no pneumothorax on chest xray  2. D/C PCA- will use Oxy IR/ Ultram for pain control 3. D/C On Q 4. Dispo- patient doing well, minimal chest tube output, + air leak with cough, leave chest tube to water seal  today and repeat cxr in am  LOS: 3 days    BARRETT, ERIN 10/23/2011   I have seen and examined [name] and agree with the above assessment  and plan.  [name] MD Beeper [phone] Office [phone] 10/23/2011 10:19 AM

## 2011-10-24 ENCOUNTER — Inpatient Hospital Stay (HOSPITAL_COMMUNITY): Payer: Medicare Other

## 2011-10-24 LAB — BASIC METABOLIC PANEL
BUN: 12 mg/dL (ref 6–23)
CO2: 27 mEq/L (ref 19–32)
Calcium: 10.9 mg/dL — ABNORMAL HIGH (ref 8.4–10.5)
Chloride: 104 mEq/L (ref 96–112)
Creatinine, Ser: 0.97 mg/dL (ref 0.50–1.35)
GFR calc Af Amer: >90 mL/min (ref >90)
GFR calc non Af Amer: 78 mL/min — ABNORMAL LOW (ref >90)
Glucose, Bld: 94 mg/dL (ref 70–99)
Potassium: 3.9 mEq/L (ref 3.5–5.1)
Sodium: 139 mEq/L (ref 135–145)

## 2011-10-24 LAB — CBC
HCT: 35.4 % — ABNORMAL LOW (ref 39.0–52.0)
Hemoglobin: 12.4 g/dL — ABNORMAL LOW (ref 13.0–17.0)
MCH: 31.2 pg (ref 26.0–34.0)
MCHC: 35 g/dL (ref 30.0–36.0)
MCV: 89.2 fL (ref 78.0–100.0)
Platelets: 239 10*3/uL (ref 150–400)
RBC: 3.97 MIL/uL — ABNORMAL LOW (ref 4.22–5.81)
RDW: 12.8 % (ref 11.5–15.5)
WBC: 8.8 10*3/uL (ref 4.0–10.5)

## 2011-10-24 NOTE — Progress Notes (Signed)
                    [address]            [name] [address]          [phone]     4 Days Post-Op Procedure(s) (LRB): VIDEO ASSISTED THORACOSCOPY (VATS)/THOROCOTOMY (Left) VIDEO BRONCHOSCOPY (N/A)  Subjective: Breathing stable, no complaints.    Objective: Vital signs in last 24 hours: Patient Vitals for the past 24 hrs:  BP Temp Temp src Pulse Resp SpO2 Weight  10/24/11 0627 - - - - - - 154 lb 1.6 oz (69.9 kg)  10/24/11 0354 115/65 mmHg 99 F (37.2 C) Oral 82  19  95 % -  10/23/11 2144 133/74 mmHg - - 77  20  95 % -  10/23/11 2113 - 98.7 F (37.1 C) Oral - - - -  10/23/11 1407 135/69 mmHg 98.7 F (37.1 C) Oral 79  18  95 % -   Current Weight  10/24/11 154 lb 1.6 oz (69.9 kg)     Intake/Output from previous day: 08/02 0701 - 08/03 0700 In: 480 [P.O.:480] Out: 1480 [Urine:1460; Chest Tube:20]    PHYSICAL EXAM:  Heart: RRR Lungs: clear Wound: clean and dry Chest tube: + 2-3 air leak with cough    Lab Results: CBC: Basename 10/24/11 0605 10/22/11 0545  WBC 8.8 9.4  HGB 12.4* 13.0  HCT 35.4* 37.3*  PLT 239 206   BMET:  Basename 10/24/11 0605 10/22/11 0545  NA 139 137  K 3.9 3.8  CL 104 102  CO2 27 27  GLUCOSE 94 96  BUN 12 9  CREATININE 0.97 0.81  CALCIUM 10.9* 10.4    PT/INR: No results found for this basename: LABPROT,INR in the last 72 hours  CXR: IMPRESSION:  Unchanged left chest tube. No pneumothorax.   Assessment/Plan: S/P Procedure(s) (LRB): VIDEO ASSISTED THORACOSCOPY (VATS)/THOROCOTOMY (Left) VIDEO BRONCHOSCOPY (N/A) Persistent air leak, CXR stable.  Continue CT to water seal. Pulm toilet/IS, ambulate.   LOS: 4 days    Cerise Lieber H 10/24/2011

## 2011-10-24 NOTE — Plan of Care (Signed)
Problem: Phase III Progression Outcomes Goal: Ambulates with dyspnea controlled Outcome: Progressing Pt continues to ambulate with assist of a walker. sats >93 during ambulation. Goal: Completes ADLs with minimal assist Outcome: Progressing Continues to need minimal assist. Only set up but does ADL's ok. Goal: Pain controlled on oral analgesia Outcome: Completed/Met Date Met:  10/24/11 Denies pain. Verbalized understanding of pain meds and when he can take them. Goal: Tolerating diet Outcome: Completed/Met Date Met:  10/24/11 Able to tolerate current heart healthy diet.

## 2011-10-25 ENCOUNTER — Inpatient Hospital Stay (HOSPITAL_COMMUNITY): Payer: Medicare Other

## 2011-10-25 NOTE — Progress Notes (Addendum)
                    [address]            [place] [address]          [phone]     5 Days Post-Op Procedure(s) (LRB): VIDEO ASSISTED THORACOSCOPY (VATS)/THOROCOTOMY (Left) VIDEO BRONCHOSCOPY (N/A)  Subjective: Walking in hall.  No complaints.  Breathing stable.  Objective: Vital signs in last 24 hours: Patient Vitals for the past 24 hrs:  BP Temp Temp src Pulse Resp SpO2  10/25/11 0532 129/68 mmHg 97.6 F (36.4 C) Oral 60  18  96 %  10/24/11 2100 142/79 mmHg 97.5 F (36.4 C) Oral 81  18  95 %  10/24/11 1514 154/85 mmHg 98.5 F (36.9 C) - 81  18  95 %   Current Weight  10/24/11 154 lb 1.6 oz (69.9 kg)     Intake/Output from previous day: 08/03 0701 - 08/04 0700 In: 720 [P.O.:720] Out: 5 [Chest Tube:5]    PHYSICAL EXAM:  Heart: RRR Lungs: clear Wounds: Clean and dry Chest tube: still with moderate 2-4/7 air leak with cough   Lab Results: CBC: Basename 10/24/11 0605  WBC 8.8  HGB 12.4*  HCT 35.4*  PLT 239   BMET:  Basename 10/24/11 0605  NA 139  K 3.9  CL 104  CO2 27  GLUCOSE 94  BUN 12  CREATININE 0.97  CALCIUM 10.9*    PT/INR: No results found for this basename: LABPROT,INR in the last 72 hours  CXR: Findings: Left-sided chest tube remains in position with tip and  sideport projecting over the left upper hemithorax. No definite  pneumothorax identified at this time. Postoperative changes of a  wedge resection are noted in the left mid - upper lung. Right lung  is clear. Probable trace left pleural effusion. Pulmonary  vasculature and the cardiomediastinal silhouette are within normal  limits allowing for slight patient rotation to the right.  Atherosclerosis in the thoracic aorta. Right-sided pacemaker  device in place with lead tips projecting over the expected  location of the right atrium and right ventricular apex.  IMPRESSION:  1. Allowing for slight differences in patient positioning, the  radiographic appearance  of the chest is essentially unchanged, as  detailed above.   Assessment/Plan: S/P Procedure(s) (LRB): VIDEO ASSISTED THORACOSCOPY (VATS)/THOROCOTOMY (Left) VIDEO BRONCHOSCOPY (N/A) Continue CT to water seal.  ?consider switching to [facility]. Pulm toilet/IS.   LOS: 5 days    COLLINS,GINA H 10/25/2011   patient examined and medical record reviewed,agree with above note. Leave Pleur-evac to water seal to assess airleak VAN TRIGT III,PETER 10/25/2011

## 2011-10-26 ENCOUNTER — Inpatient Hospital Stay (HOSPITAL_COMMUNITY): Payer: Medicare Other

## 2011-10-26 MED ORDER — LACTULOSE 10 GM/15ML PO SOLN
20.0000 g | Freq: Once | ORAL | Status: AC
  Administered 2011-10-26: 20 g via ORAL
  Filled 2011-10-26: qty 30

## 2011-10-26 NOTE — Progress Notes (Addendum)
6 Days Post-Op Procedure(s) (LRB): VIDEO ASSISTED THORACOSCOPY (VATS)/THOROCOTOMY (Left) VIDEO BRONCHOSCOPY (N/A)  Subjective: Patient passing flatus but no bowel movement in 3 days.  Objective: Vital signs in last 24 hours: Patient Vitals for the past 24 hrs:  BP Temp Temp src Pulse Resp SpO2  10/26/11 0552 121/75 mmHg 97.6 F (36.4 C) Oral 70  18  98 %  10/25/11 2150 132/74 mmHg 97.6 F (36.4 C) Oral 68  18  96 %      Intake/Output from previous day: 08/04 0701 - 08/05 0700 In: 720 [P.O.:720] Out: 1250 [Urine:1250]   Physical Exam:  Cardiovascular: RRR, no murmurs, gallops, or rubs. Pulmonary: Clear to auscultation bilaterally; no rales, wheezes, or rhonchi. Abdomen: Soft, non tender, bowel sounds present. Wounds: Clean and dry.  No erythema or signs of infection. Chest Tube: +3-4 air leak with cough  Lab Results: CBC: Basename 10/24/11 0605  WBC 8.8  HGB 12.4*  HCT 35.4*  PLT 239   BMET:  Basename 10/24/11 0605  NA 139  K 3.9  CL 104  CO2 27  GLUCOSE 94  BUN 12  CREATININE 0.97  CALCIUM 10.9*    PT/INR: No results found for this basename: LABPROT,INR in the last 72 hours ABG:  INR: Will add last result for INR, ABG once components are confirmed Will add last 4 CBG results once components are confirmed  Assessment/Plan:  1. CV - SR. 2.  Pulmonary - Chest tube with scant output. Chest tube is to waterseal and there is 3-4 air leak with cough.CXR this am shows no pneumothorax, mild elevation of left hemidiaphragm, and trace left pleural effusion.Possible place to mini express. Dr. Tyrone Sage to evaluate. 3.LOC constipation.  ZIMMERMAN,DONIELLE MPA-C 10/26/2011   attempted to put ct back to suction, but patient noted increased discomfort so have left to water seal I have seen and examined [name] and agree with the above assessment  and plan.  [name] MD Beeper [phone] Office [phone] 10/26/2011

## 2011-10-27 ENCOUNTER — Inpatient Hospital Stay (HOSPITAL_COMMUNITY): Payer: Medicare Other

## 2011-10-27 NOTE — Progress Notes (Signed)
Pt ambulating independently in hallway with walker; will cont. To monitor.

## 2011-10-27 NOTE — Progress Notes (Signed)
Pt ambulating independently with walker at this time; will cont. To monitor.

## 2011-10-27 NOTE — Progress Notes (Addendum)
7 Days Post-Op Procedure(s) (LRB): VIDEO ASSISTED THORACOSCOPY (VATS)/THOROCOTOMY (Left) VIDEO BRONCHOSCOPY (N/A) Subjective:  Mr. Duthie has no complaints this morning.  Is hopeful that he can get his chest tube out soon.  Objective: Vital signs in last 24 hours: Temp:  [97.4 F (36.3 C)-98.4 F (36.9 C)] 97.4 F (36.3 C) (08/06 0409) Pulse Rate:  [65-89] 65  (08/06 0409) Cardiac Rhythm:  [-] Normal sinus rhythm;Heart block;Other (Comment) (08/06 0804) Resp:  [18] 18  (08/06 0409) BP: (120-127)/(69-73) 127/69 mmHg (08/06 0409) SpO2:  [94 %-96 %] 94 % (08/06 0409)  Intake/Output from previous day: 08/05 0701 - 08/06 0700 In: 1280 [P.O.:1280] Out: 1550 [Urine:1450; Chest Tube:100]  General appearance: alert, cooperative and no distress Heart: regular rate and rhythm Lungs: clear to auscultation bilaterally Abdomen: soft, non-tender; bowel sounds normal; no masses,  no organomegaly Extremities: edema none appreciated Wound: clean and dry  Lab Results: No results found for this basename: WBC:2,HGB:2,HCT:2,PLT:2 in the last 72 hours BMET: No results found for this basename: NA:2,K:2,CL:2,CO2:2,GLUCOSE:2,BUN:2,CREATININE:2,CALCIUM:2 in the last 72 hours  PT/INR: No results found for this basename: LABPROT,INR in the last 72 hours ABG    Component Value Date/Time   PHART 7.388 10/21/2011 0514   HCO3 26.6* 10/21/2011 0514   TCO2 28 10/21/2011 0514   O2SAT 95.0 10/21/2011 0514   CBG (last 3)  No results found for this basename: GLUCAP:3 in the last 72 hours  Assessment/Plan: S/P Procedure(s) (LRB): VIDEO ASSISTED THORACOSCOPY (VATS)/THOROCOTOMY (Left) VIDEO BRONCHOSCOPY (N/A)  1. CV- NSR 2. Pulm- chest tube in place +air leak with cough, no evidence of pneumothorax on chest xray 3. Dispo- possibly connect Mini Express Pleurovac   LOS: 7 days    BARRETT, ERIN 10/27/2011   Will change to mini express, still with small air leak. Discussed with patient treatment options,  consider chagne to miniexpress and home with tube. With obliteration of pleural space unlikly will develop ptx  I have seen and examined [name] and agree with the above assessment  and plan.  [name] MD Beeper [phone] Office [phone] 10/27/2011 12:17 PM

## 2011-10-27 NOTE — Progress Notes (Signed)
Pt up ambulating independently in hallway with walker; will cont. To monitor.

## 2011-10-28 ENCOUNTER — Inpatient Hospital Stay (HOSPITAL_COMMUNITY): Payer: Medicare Other

## 2011-10-28 NOTE — Progress Notes (Addendum)
8 Days Post-Op Procedure(s) (LRB): VIDEO ASSISTED THORACOSCOPY (VATS)/THOROCOTOMY (Left) VIDEO BRONCHOSCOPY (N/A)  Subjective:  [name] has no complaints this morning.  He wants to be discharged today  Objective: Vital signs in last 24 hours: Temp:  [97.4 F (36.3 C)-98.1 F (36.7 C)] 97.6 F (36.4 C) (08/07 0438) Pulse Rate:  [77-85] 82  (08/07 0438) Cardiac Rhythm:  [-] Normal sinus rhythm (08/06 1940) Resp:  [18-19] 18  (08/07 0438) BP: (123-135)/(69-75) 135/69 mmHg (08/07 0438) SpO2:  [94 %-98 %] 98 % (08/07 0438)  Intake/Output from previous day: 08/06 0701 - 08/07 0700 In: 480 [P.O.:480] Out: 1301 [Urine:1300; Stool:1]  General appearance: alert, cooperative and no distress Neurologic: intact Heart: regular rate and rhythm Lungs: clear to auscultation bilaterally Abdomen: soft, non-tender; bowel sounds normal; no masses,  no organomegaly Extremities: edema none appreciated Wound: clean and dry  Lab Results: No results found for this basename: WBC:2,HGB:2,HCT:2,PLT:2 in the last 72 hours BMET: No results found for this basename: NA:2,K:2,CL:2,CO2:2,GLUCOSE:2,BUN:2,CREATININE:2,CALCIUM:2 in the last 72 hours  PT/INR: No results found for this basename: LABPROT,INR in the last 72 hours ABG    Component Value Date/Time   PHART 7.388 10/21/2011 0514   HCO3 26.6* 10/21/2011 0514   TCO2 28 10/21/2011 0514   O2SAT 95.0 10/21/2011 0514   CBG (last 3)  No results found for this basename: GLUCAP:3 in the last 72 hours  Assessment/Plan: S/P Procedure(s) (LRB): VIDEO ASSISTED THORACOSCOPY (VATS)/THOROCOTOMY (Left) VIDEO BRONCHOSCOPY (N/A)  1. CV- NSR 2. Chest tube converted to Mini Express yesterday, air leak persists, no pneumothorax on CXR, improved lung aeration 3. Dispo- patient would like to go home with Mini Express today.  Will discuss with Dr. Tyrone Sage   LOS: 8 days    Lowella Dandy 10/28/2011  Stable with small airleak on miniexpress plan d/c today and  follow up in office next Monday and Thursday I have seen and examined [name] and agree with the above assessment  and plan.  [name] MD Beeper [phone] Office [phone] 10/28/2011 11:25 AM

## 2011-10-28 NOTE — Discharge Summary (Signed)
Physician Discharge Summary  Patient ID: DIOGO ANNE MRN: [id number] DOB/AGE: 06-17-1932 78 y.o.  Admit date: 10/20/2011 Discharge date: 10/28/2011  Admission Diagnoses:  Patient Active Problem List  Diagnosis  . HYPERLIPIDEMIA  . HYPERTENSION  . CAD  . PULMONARY EMBOLISM  . AV BLOCK, COMPLETE  . COPD  . GERD  . DYSPNEA  . CHEST PAIN UNSPECIFIED  . CHOKING  . PACEMAKER, PERMANENT  . OTHER SECOND DEGREE ATRIOVENTRICULAR BLOCK  . Jaw pain  . Hypotension  . Occlusion and stenosis of carotid artery without mention of cerebral infarction  . Lung cancer, upper lobe Left   Discharge Diagnoses:   Patient Active Problem List  Diagnosis  . HYPERLIPIDEMIA  . HYPERTENSION  . CAD  . PULMONARY EMBOLISM  . AV BLOCK, COMPLETE  . COPD  . GERD  . DYSPNEA  . CHEST PAIN UNSPECIFIED  . CHOKING  . PACEMAKER, PERMANENT  . OTHER SECOND DEGREE ATRIOVENTRICULAR BLOCK  . Jaw pain  . Hypotension  . Occlusion and stenosis of carotid artery without mention of cerebral infarction  . Lung cancer, upper lobe Left   Discharged Condition: good  History of Present Illness:   Mr. Cowsert is a 78 yo male who was referred to Dr. Tyrone Sage after an incidental finding of a left lung nodule.  The patient has a long standing history of Hypertension, hyperlipidemia, GERD, Dyspnea, Pacemaker placement and CAD.  He had been having a 6 month history of persistent chest discomfort, shortness of breath and jaw pain.  He underwent extensive cardiac workup including catheterization which revealed non-obstructive coronary disease.  He underwent evaluation for jaw pain by ENT who felt his symptoms were most likely related to acid reflux  The patient had been hospitalized several times and had been seen in the ED on several occasions.  He did undergo CXR which revealed evidence of a left lung nodule.  Due to this finding and the patient's extensive smoking history he was set up for PET CT scan.  This was performed and  revealed a 1.3cm nodule in the left upper lobe with borderline hypermetabolic activity.  This prompted referral to Dr. Tyrone Sage for surgical evaluation.  He was initially evaluated on 09/15/2011 at which time Dr. Tyrone Sage felt the patient would benefit from CT guided biopsy for possible diagnosis, due to the fact of patient's previous thoracotomy.  If diagnosis was obtained it would be able to help guide the surgical approach.  The patient was agreeable to this treatment plan and he would proceed with scheduled needle biopsy with Dr. [name].  The patient again followed up with Dr. Tyrone Sage on 09/25/2011 for further surgical evaluation.  CT guided needle biopsy was performed and confirmed a diagnosis of non-smell cell lung cancer.  Due to the definitive diagnosis it was felt that the patient would benefit from a left VATS with lung resection.  The risks and benefits were explained to the patient and he was willing to proceed with surgery, scheduled for 10/20/2011.  Hospital Course:   Mr. Mcgreal presented to [facility] on 10/20/2011.  He was taken to the operating room and underwent a Left VATS with wedge resection and segmentectomy of superior segment of left lower lobe. The patient tolerated the procedure well, was extubated and transferred to the PACU in stable condition.  POD #1 patients arterial line was removed without difficulty.  POD #2 patients chest tube in place with small air leak.  No pneumothorax on chest xray.  His chest tube was  placed to water seal.  POD #3 chest tube with persistent air leak.  No evidence of pneumothorax on chest xray.  POD #5 the patient continued to have persistent air leak.  Chest tube was placed back on suction, however patient noted increased pain and was unable to tolerate.  Therefore, chest tube was left on water seal.  POD #6 the patient's chest tube was converted to a mini express.  POD #7 the patient's chest tube remains connected to a mini express.  There is an air  leak present with cough.  Chest xray shows improved aeration and no evidence of pneumothorax.  The patient is doing very well.  He was provided education on how to manage the mini express at home.  He will be discharged home today.  He will follow up with Dr. Tyrone Sage on 11/05/2011 at 9:45.  The patient will need to have chest xray completed at 8:30 at [facility] located on the first floor of [facility].  He will also need to follow up with PCP in 2 weeks time.    Significant Diagnostic Studies: Pathology  Lung, wedge biopsy/resection, left superior lower lobe - INVASIVE MODERATELY DIFFERENTIATED ADENOCARCINOMA, SPANNING 2.2 CM IN GREATEST DIMENSION. - PLEURA IS UNINVOLVED. - LYMPH/VASCULAR INVASION IS NOT IDENTIFIED. - MARGIN IS NEGATIVE. - SEE ONCOLOGY TEMPLATE.  Treatments: surgery:   Repeat left Mini thoracotomy, wedge resection, and  segmentectomy of the superior segment of the left lower lobe, and  placement of On-Q device.  Disposition: 01-Home or Self Care  Discharge Orders    Future Appointments: Provider: Department: Dept Phone: Center:   11/12/2011 10:25 AM [facility] [phone] [facility]     Medication List  As of 10/28/2011  8:44 AM   TAKE these medications         albuterol 90 MCG/ACT inhaler   Commonly known as: PROVENTIL,VENTOLIN   Inhale 2 puffs into the lungs every 6 (six) hours as needed. For wheezing or shortness of breath      DULoxetine 60 MG capsule   Commonly known as: CYMBALTA   Take 60 mg by mouth daily.      losartan 50 MG tablet   Commonly known as: COZAAR   Take 50 mg by mouth daily.      multivitamin tablet   Take 1 tablet by mouth daily.      nitroGLYCERIN 0.4 MG SL tablet   Commonly known as: NITROSTAT   Place 0.4 mg under the tongue every 5 (five) minutes as needed. For chest pain      Rivaroxaban 20 MG Tabs   Commonly known as: XARELTO   Take 20 mg by mouth daily.       traMADol 50 MG tablet   Commonly known as: ULTRAM   Take 1-2 tablets (50-100 mg total) by mouth every 6 (six) hours as needed.           Follow-up Information    Follow up with GERHARDT,EDWARD B, MD in 2 weeks. (Office will contact you with appointment)    Contact information:   [address] [place] [address] [phone]       Follow up with [facility] in 2 weeks. (Please get chest xray performed 1 hour prior to your post operative appointment)          Signed: Lowella Dandy 10/28/2011, 8:44 AM

## 2011-11-02 ENCOUNTER — Ambulatory Visit (INDEPENDENT_AMBULATORY_CARE_PROVIDER_SITE_OTHER): Payer: Self-pay | Admitting: Surgical

## 2011-11-02 ENCOUNTER — Other Ambulatory Visit: Payer: Self-pay | Admitting: Cardiothoracic Surgery

## 2011-11-02 VITALS — BP 120/72 | HR 116 | Resp 16 | Ht 71.0 in | Wt 162.0 lb

## 2011-11-02 DIAGNOSIS — C3490 Malignant neoplasm of unspecified part of unspecified bronchus or lung: Secondary | ICD-10-CM

## 2011-11-02 DIAGNOSIS — Z09 Encounter for follow-up examination after completed treatment for conditions other than malignant neoplasm: Secondary | ICD-10-CM

## 2011-11-02 DIAGNOSIS — J95812 Postprocedural air leak: Secondary | ICD-10-CM

## 2011-11-02 NOTE — Patient Instructions (Signed)
Continue current dressing and incision care. Continue home nursing care of the mini express chest tube.

## 2011-11-02 NOTE — Progress Notes (Signed)
  HPI: Status post recent left VATS procedure for segmentectomy. Seen in office today for a wound check. He continues to have a mean express which does appear to have an active air leak. He is having no fevers, chills or other constitutional symptoms.  Current Outpatient Prescriptions  Medication Sig Dispense Refill  . albuterol (PROVENTIL,VENTOLIN) 90 MCG/ACT inhaler Inhale 2 puffs into the lungs every 6 (six) hours as needed. For wheezing or shortness of breath      . DULoxetine (CYMBALTA) 60 MG capsule Take 60 mg by mouth daily.      Marland Kitchen losartan (COZAAR) 50 MG tablet Take 50 mg by mouth daily.      . Multiple Vitamin (MULTIVITAMIN) tablet Take 1 tablet by mouth daily.      . nitroGLYCERIN (NITROSTAT) 0.4 MG SL tablet Place 0.4 mg under the tongue every 5 (five) minutes as needed. For chest pain      . Rivaroxaban (XARELTO) 20 MG TABS Take 20 mg by mouth daily.  30 tablet  6  . traMADol (ULTRAM) 50 MG tablet Take 1-2 tablets (50-100 mg total) by mouth every 6 (six) hours as needed.  30 tablet  0  . DISCONTD: albuterol (PROVENTIL,VENTOLIN) 90 MCG/ACT inhaler Inhale 2 puffs into the lungs every 6 (six) hours as needed for wheezing or shortness of breath.  17 g  3    Physical Exam: Incision-healing well without evidence of infection. There is some old drainage on the dressing. There is no obvious purulence.  Diagnostic Tests: None  Impression: Incisions healing well  Plan: To return as scheduled this Thursday for followup with the surgeon.

## 2011-11-03 ENCOUNTER — Other Ambulatory Visit: Payer: Self-pay | Admitting: Cardiothoracic Surgery

## 2011-11-03 DIAGNOSIS — D381 Neoplasm of uncertain behavior of trachea, bronchus and lung: Secondary | ICD-10-CM

## 2011-11-05 ENCOUNTER — Ambulatory Visit (INDEPENDENT_AMBULATORY_CARE_PROVIDER_SITE_OTHER): Payer: Self-pay | Admitting: Cardiothoracic Surgery

## 2011-11-05 ENCOUNTER — Encounter: Payer: Self-pay | Admitting: Cardiothoracic Surgery

## 2011-11-05 ENCOUNTER — Ambulatory Visit
Admission: RE | Admit: 2011-11-05 | Discharge: 2011-11-05 | Disposition: A | Discharge status: Home / Self Care | Payer: Medicare Other | Source: Ambulatory Visit | Attending: Cardiothoracic Surgery | Admitting: Cardiothoracic Surgery

## 2011-11-05 ENCOUNTER — Other Ambulatory Visit: Payer: Self-pay | Admitting: Cardiothoracic Surgery

## 2011-11-05 VITALS — BP 104/67 | HR 107 | Temp 98.2°F | Resp 16 | Ht 71.0 in | Wt 162.0 lb

## 2011-11-05 DIAGNOSIS — Z9889 Other specified postprocedural states: Secondary | ICD-10-CM

## 2011-11-05 DIAGNOSIS — C3490 Malignant neoplasm of unspecified part of unspecified bronchus or lung: Secondary | ICD-10-CM

## 2011-11-05 DIAGNOSIS — Z09 Encounter for follow-up examination after completed treatment for conditions other than malignant neoplasm: Secondary | ICD-10-CM

## 2011-11-05 DIAGNOSIS — D381 Neoplasm of uncertain behavior of trachea, bronchus and lung: Secondary | ICD-10-CM

## 2011-11-05 DIAGNOSIS — C3410 Malignant neoplasm of upper lobe, unspecified bronchus or lung: Secondary | ICD-10-CM

## 2011-11-05 NOTE — Progress Notes (Signed)
                   [address]            [place] [address]          [phone]     HPI: Status post recent left VATS procedure for segmentectomy for .pT1b, pNX, MX.  Seen in office today for a wound check. He continues to have a mean express which does appear to have an  air leak but just with cough decreased. He is having no fevers, chills or other constitutional symptoms.Was cutting his grass yesterday  Current Outpatient Prescriptions  Medication Sig Dispense Refill  . albuterol (PROVENTIL,VENTOLIN) 90 MCG/ACT inhaler Inhale 2 puffs into the lungs every 6 (six) hours as needed. For wheezing or shortness of breath      . DULoxetine (CYMBALTA) 60 MG capsule Take 60 mg by mouth daily.      Marland Kitchen losartan (COZAAR) 50 MG tablet Take 50 mg by mouth daily.      . Multiple Vitamin (MULTIVITAMIN) tablet Take 1 tablet by mouth daily.      . nitroGLYCERIN (NITROSTAT) 0.4 MG SL tablet Place 0.4 mg under the tongue every 5 (five) minutes as needed. For chest pain      . Rivaroxaban (XARELTO) 20 MG TABS Take 20 mg by mouth daily.  30 tablet  6  . DISCONTD: albuterol (PROVENTIL,VENTOLIN) 90 MCG/ACT inhaler Inhale 2 puffs into the lungs every 6 (six) hours as needed for wheezing or shortness of breath.  17 g  3    Physical Exam: Incision-healing well without evidence of infection. Small air leaf, wound well healed  Diagnostic Tests: Dg Chest 2 View  11/05/2011  *RADIOLOGY REPORT*  Clinical Data: 1 week follow up status post VATS.  Lung cancer.  CHEST - 2 VIEW  Comparison: Plain film of 10/28/2011 and PET of 09/03/2011.  Findings: Pacer with leads at right atrium and right ventricle.  No lead discontinuity.  Left-sided chest tube is unchanged in position.  Midline trachea.  Normal heart size and mediastinal contours for age.  Minimal left-sided pleural thickening.  Volume loss and surgical sutures in the left lung/hemothorax.  The right lung is clear.  There is similar mild subsegmental  atelectasis at the left lung base and in the region of surgical sutures.  Left apical pleural thickening.  Lucency surrounding the left-sided chest tube on the frontal view is favored to be due to aerated lung adjacent atelectasis.  IMPRESSION: Left-sided chest tube remaining in place.  Surgical changes on the left, without pneumothorax or acute superimposed process.  Original Report Authenticated By: Consuello Bossier, M.D.    Impression: Incisions healing well  Plan: To return as scheduled this Monday to check air leak and consider removal of tube.

## 2011-11-09 ENCOUNTER — Other Ambulatory Visit: Payer: Self-pay | Admitting: Cardiothoracic Surgery

## 2011-11-09 ENCOUNTER — Ambulatory Visit (INDEPENDENT_AMBULATORY_CARE_PROVIDER_SITE_OTHER): Payer: Self-pay | Admitting: Physician Assistant

## 2011-11-09 VITALS — BP 124/75 | HR 110 | Resp 20 | Ht 71.0 in | Wt 162.0 lb

## 2011-11-09 DIAGNOSIS — Z09 Encounter for follow-up examination after completed treatment for conditions other than malignant neoplasm: Secondary | ICD-10-CM

## 2011-11-09 DIAGNOSIS — D381 Neoplasm of uncertain behavior of trachea, bronchus and lung: Secondary | ICD-10-CM

## 2011-11-09 DIAGNOSIS — C3490 Malignant neoplasm of unspecified part of unspecified bronchus or lung: Secondary | ICD-10-CM

## 2011-11-09 NOTE — Progress Notes (Signed)
  HPI:  Patient presents today for wound check.  He has a left pleural chest tube in place hooked to a Mini Express.  Currently the patient states he does feel well.  He states he feels tired and is nauseated today.  He states his wife has a cold and he hopes he isn't catching it.  He states there is minimal drainage from his chest tube approximately 20cc per day.  Current Outpatient Prescriptions  Medication Sig Dispense Refill  . albuterol (PROVENTIL,VENTOLIN) 90 MCG/ACT inhaler Inhale 2 puffs into the lungs every 6 (six) hours as needed. For wheezing or shortness of breath      . DULoxetine (CYMBALTA) 60 MG capsule Take 60 mg by mouth daily.      Marland Kitchen losartan (COZAAR) 50 MG tablet Take 50 mg by mouth daily.      . Multiple Vitamin (MULTIVITAMIN) tablet Take 1 tablet by mouth daily.      . nitroGLYCERIN (NITROSTAT) 0.4 MG SL tablet Place 0.4 mg under the tongue every 5 (five) minutes as needed. For chest pain      . Rivaroxaban (XARELTO) 20 MG TABS Take 20 mg by mouth daily.  30 tablet  6  . DISCONTD: albuterol (PROVENTIL,VENTOLIN) 90 MCG/ACT inhaler Inhale 2 puffs into the lungs every 6 (six) hours as needed for wheezing or shortness of breath.  17 g  3    Physical Exam:  BP 124/75  Pulse 110  Resp 20  Ht 5\' 11"  (1.803 m)  Wt 162 lb (73.483 kg)  BMI 22.59 kg/m2  SpO2 98%  Gen: no apparent distress Lungs: CTA bilaterally Heart: RRR Abd: soft non-tender, non-distended Skin: left pleural chest tube site appears to have some yellowish drainage present around tube Neuro: grossly intact  Diagnostic Tests: None today, CXR reviewed from 8/15- no pneumothorax present  Impression:  Mr. Calixte has a left pleural chest tube in place connected to a Mini Express.  There appears to be a very small air leak present when the patient's coughs.  However, it has decreased since I saw the patient at hospital discharge.   Plan:  Patient will see Dr. Tyrone Sage on Thursday at 9:00 AM.  Hopefully air  leak will have resolved by then and we can remove chest tube

## 2011-11-12 ENCOUNTER — Ambulatory Visit
Admission: RE | Admit: 2011-11-12 | Discharge: 2011-11-12 | Disposition: A | Discharge status: Home / Self Care | Payer: Medicare Other | Source: Ambulatory Visit | Attending: Cardiothoracic Surgery | Admitting: Cardiothoracic Surgery

## 2011-11-12 ENCOUNTER — Encounter: Payer: Self-pay | Admitting: Cardiothoracic Surgery

## 2011-11-12 ENCOUNTER — Ambulatory Visit (INDEPENDENT_AMBULATORY_CARE_PROVIDER_SITE_OTHER): Payer: Self-pay | Admitting: Cardiothoracic Surgery

## 2011-11-12 ENCOUNTER — Encounter: Payer: Self-pay | Admitting: *Deleted

## 2011-11-12 ENCOUNTER — Ambulatory Visit (INDEPENDENT_AMBULATORY_CARE_PROVIDER_SITE_OTHER): Payer: Medicare Other | Admitting: *Deleted

## 2011-11-12 ENCOUNTER — Encounter: Payer: Self-pay | Admitting: Internal Medicine

## 2011-11-12 VITALS — BP 126/73 | HR 108 | Resp 16 | Ht 71.0 in | Wt 157.0 lb

## 2011-11-12 DIAGNOSIS — C3410 Malignant neoplasm of upper lobe, unspecified bronchus or lung: Secondary | ICD-10-CM

## 2011-11-12 DIAGNOSIS — Z09 Encounter for follow-up examination after completed treatment for conditions other than malignant neoplasm: Secondary | ICD-10-CM

## 2011-11-12 DIAGNOSIS — I442 Atrioventricular block, complete: Secondary | ICD-10-CM

## 2011-11-12 DIAGNOSIS — D381 Neoplasm of uncertain behavior of trachea, bronchus and lung: Secondary | ICD-10-CM

## 2011-11-12 DIAGNOSIS — J95812 Postprocedural air leak: Secondary | ICD-10-CM

## 2011-11-12 LAB — REMOTE PACEMAKER DEVICE
AL AMPLITUDE: 4.2 mv
AL IMPEDENCE PM: 450 Ohm
BAMS-0001: 150 {beats}/min
BATTERY VOLTAGE: 2.95 V
DEVICE MODEL PM: 7167612
RV LEAD AMPLITUDE: 9.7 mv
RV LEAD IMPEDENCE PM: 330 Ohm

## 2011-11-12 NOTE — Patient Instructions (Addendum)
Keep dressing sealed for two days Call if have fever Try ensure three times per day to help with nutrition and weight

## 2011-11-12 NOTE — Progress Notes (Signed)
                                   [address]            [name] [address]          [phone]       HPI: Status post recent left VATS procedure for segmentectomy for .pT1b, pNX, MX.  Seen in office today for a wound check. He continues to have a mini  express which is no longer leaking air. He is having no fevers, chills or other constitutional symptoms.Was cutting his grass this week.Ha shad some nausea, no vomiting  Current Outpatient Prescriptions  Medication Sig Dispense Refill  . albuterol (PROVENTIL,VENTOLIN) 90 MCG/ACT inhaler Inhale 2 puffs into the lungs every 6 (six) hours as needed. For wheezing or shortness of breath      . DULoxetine (CYMBALTA) 60 MG capsule Take 60 mg by mouth daily.      Marland Kitchen losartan (COZAAR) 50 MG tablet Take 50 mg by mouth daily.      . Multiple Vitamin (MULTIVITAMIN) tablet Take 1 tablet by mouth daily.      . nitroGLYCERIN (NITROSTAT) 0.4 MG SL tablet Place 0.4 mg under the tongue every 5 (five) minutes as needed. For chest pain      . Rivaroxaban (XARELTO) 20 MG TABS Take 20 mg by mouth daily.  30 tablet  6  . DISCONTD: albuterol (PROVENTIL,VENTOLIN) 90 MCG/ACT inhaler Inhale 2 puffs into the lungs every 6 (six) hours as needed for wheezing or shortness of breath.  17 g  3    Physical Exam: Incision-healing well without evidence of infection. No air l air leaf, wound well healed Abdomen soft with out tenderness  Diagnostic Tests: Dg Chest 2 View  11/12/2011  *RADIOLOGY REPORT*  Clinical Data: Follow up lung surgery  CHEST - 2 VIEW  Comparison: 11/05/2011  Findings: There is a right chest wall pacer device with lead in the right atrial appendage and right ventricle.  A left-sided chest tube is in place.  Interval decrease in lucency surrounding the intrathoracic portions of the chest tube. Postoperative change change in the left upper lobe is stable.  Volume loss involving the right lung appears similar to previous exam.  Chronic  interstitial coarsening is identified bilaterally.  IMPRESSION:  1.  Stable appearance of the chest. 2.  Left-sided chest tube without pneumothorax.   Original Report Authenticated By: Rosealee Albee, M.D.     Impression: Incisions healing well  Plan: Chest tube removed today, Vaseline gauze placed Return Monday to check wound See me in two weeks   [name] MD  Beeper [phone] Office [phone] 11/12/2011 9:40 AM

## 2011-11-13 ENCOUNTER — Telehealth: Payer: Self-pay | Admitting: *Deleted

## 2011-11-13 DIAGNOSIS — R5082 Postprocedural fever: Secondary | ICD-10-CM

## 2011-11-13 MED ORDER — DOXYCYCLINE HYCLATE 100 MG PO TABS: 100.0000 mg | ORAL_TABLET | Freq: Two times a day (BID) | ORAL | Status: DC

## 2011-11-13 NOTE — Telephone Encounter (Signed)
Mr. Belisle was here in the office yesterday and had his left mini-express removed.  The site was covered with a petroleum dressing to be removed in 24 hours.  He calls today to  notify us that he has had a fever today of 99.2 to 100.6.  I notified Dr. Tyrone Sage of same.  He ordered an antibiotic which was called to his pharmacy.  He understands.

## 2011-11-16 ENCOUNTER — Ambulatory Visit (INDEPENDENT_AMBULATORY_CARE_PROVIDER_SITE_OTHER): Payer: Self-pay | Admitting: Surgical

## 2011-11-16 ENCOUNTER — Ambulatory Visit
Admission: RE | Admit: 2011-11-16 | Discharge: 2011-11-16 | Disposition: A | Discharge status: Home / Self Care | Payer: Medicare Other | Source: Ambulatory Visit | Attending: Cardiothoracic Surgery | Admitting: Cardiothoracic Surgery

## 2011-11-16 VITALS — BP 88/58 | HR 106 | Temp 99.2°F | Resp 16 | Ht 71.0 in | Wt 156.0 lb

## 2011-11-16 DIAGNOSIS — C3410 Malignant neoplasm of upper lobe, unspecified bronchus or lung: Secondary | ICD-10-CM

## 2011-11-16 DIAGNOSIS — J189 Pneumonia, unspecified organism: Secondary | ICD-10-CM

## 2011-11-16 DIAGNOSIS — J95812 Postprocedural air leak: Secondary | ICD-10-CM

## 2011-11-16 DIAGNOSIS — D381 Neoplasm of uncertain behavior of trachea, bronchus and lung: Secondary | ICD-10-CM

## 2011-11-16 MED ORDER — MOXIFLOXACIN HCL 400 MG PO TABS: 400.0000 mg | ORAL_TABLET | Freq: Every day | ORAL | Status: DC

## 2011-11-16 MED ORDER — ONE-DAILY MULTI VITAMINS PO TABS: 1.0000 | ORAL_TABLET | Freq: Every day | ORAL | Status: DC

## 2011-11-16 NOTE — Patient Instructions (Signed)
Stop Doxy Start Avelox Patient and wife understand

## 2011-11-16 NOTE — Progress Notes (Signed)
PCP is [name], MD Referring Provider is [name], *  Chief Complaint  Patient presents with  . Routine Post Op    1 wk f/u with cxr...    HPI:Has had fevers upto 101.7 (last Saturday) but appears to be defervescing on doxy. Having significant nausea however. Denies SOB. Has Dry cough.   Past Medical History  Diagnosis Date  . Hyperlipidemia   . Hypertension   . Esophageal reflux   . Diverticulosis   . Rheumatic fever ~ 1944  . Vision loss of left eye     S/P "cataract OR"; "can see a little; not much"  . Kidney stones   . Hard of hearing, left   . Pulmonary embolism     On Xarelto  . Hypercalcemia   . Arthritis     "knees"  . CAD (coronary artery disease)     nonobstructive by cath 07/22/11 with small to moderate sized diagonal branch with ostial stenosis , medical therapy advised  . Prostate cancer     s/p cryotherapy  . AV block     s/p PPM  . COPD (chronic obstructive pulmonary disease)   . Carotid artery aneurysm     Bilateral internal carotid artery aneurysms followed by Dr. Arbie Cookey  . Shortness of breath     with ambulation  . Constipation     Past Surgical History  Procedure Date  . Cystoscopy   . Lung surgery 1957    "born [place]/bleb in [place]  . Insert / replace / remove pacemaker 1991    initial placement  . Insert / replace / remove pacemaker ~ 2011    "changed out"  . Tonsillectomy and adenoidectomy 1943  . Appendectomy 2000's  . Cholecystectomy 2000's  . Cataract extraction w/ intraocular lens implant ~ 2008    left eye  . Lithotripsy ~ 2011    "twice"  . Cardiac catheterization   . Prostate cryoablation ~ 2008  . Video bronchoscopy 10/20/2011    Procedure: VIDEO BRONCHOSCOPY;  Surgeon: [name], MD;  Location: [facility] OR;  Service: Thoracic;  Laterality: N/A;    Family History  Problem Relation Age of Onset  . Hypertension Neg Hx   . Hyperlipidemia Neg Hx   . Heart failure Neg Hx   . Heart disease Neg Hx   . Colon  cancer Neg Hx   . Breast cancer Mother   . Emphysema Sister     was a smoker    Social History History  Substance Use Topics  . Smoking status: Former Smoker -- 1.0 packs/day for 50 years    Types: Cigarettes    Quit date: 03/23/2004  . Smokeless tobacco: Current User    Types: Chew  . Alcohol Use: No    Current Outpatient Prescriptions  Medication Sig Dispense Refill  . albuterol (PROVENTIL,VENTOLIN) 90 MCG/ACT inhaler Inhale 2 puffs into the lungs every 6 (six) hours as needed. For wheezing or shortness of breath      . DULoxetine (CYMBALTA) 60 MG capsule Take 60 mg by mouth daily.      Marland Kitchen losartan (COZAAR) 50 MG tablet Take 50 mg by mouth daily.      . Multiple Vitamin (MULTIVITAMIN) tablet Take 1 tablet by mouth daily.      . nitroGLYCERIN (NITROSTAT) 0.4 MG SL tablet Place 0.4 mg under the tongue every 5 (five) minutes as needed. For chest pain      . Rivaroxaban (XARELTO) 20 MG TABS Take 20 mg  by mouth daily.  30 tablet  6  . moxifloxacin (AVELOX) 400 MG tablet Take 1 tablet (400 mg total) by mouth daily.  7 tablet  0  . DISCONTD: albuterol (PROVENTIL,VENTOLIN) 90 MCG/ACT inhaler Inhale 2 puffs into the lungs every 6 (six) hours as needed for wheezing or shortness of breath.  17 g  3    Allergies  Allergen Reactions  . Iohexol      Desc: PT STATED TODAY HE GETS SLIGHT SOB FROM IV CONTRAST. HE NEEDS FULL PREMEDS FROM NOW ON PER DR. MATTERN   . Penicillins Itching and Rash    "haven't took none in 40 years"    Review of Systems otherwise non-contrib  BP 88/58  Pulse 106  Temp 99.2 F (37.3 C) (Oral)  Resp 16  Ht 5\' 11"  (1.803 m)  Wt 156 lb (70.761 kg)  BMI 21.76 kg/m2  SpO2 97% Physical Exam Gen- well appearing, NAD Lungs- clear throughout Cor- RRR Incisions- healing well Abd- soft, non-tender, + BS  Diagnostic Tests:CXR- has more effusion and infiltrate noted LUL   Impression:Poss pneumonia, but clinically appears stable in no distress   Plan: D/C  doxy with significant nausea. Start avelox 400 mg po q day for 7 days OV one week with repeat CXR

## 2011-11-24 ENCOUNTER — Encounter: Payer: Self-pay | Admitting: *Deleted

## 2011-11-26 ENCOUNTER — Encounter: Payer: Self-pay | Admitting: Cardiothoracic Surgery

## 2011-11-26 ENCOUNTER — Ambulatory Visit (INDEPENDENT_AMBULATORY_CARE_PROVIDER_SITE_OTHER): Payer: Self-pay | Admitting: Cardiothoracic Surgery

## 2011-11-26 ENCOUNTER — Other Ambulatory Visit: Payer: Self-pay | Admitting: Cardiothoracic Surgery

## 2011-11-26 ENCOUNTER — Ambulatory Visit
Admission: RE | Admit: 2011-11-26 | Discharge: 2011-11-26 | Disposition: A | Discharge status: Home / Self Care | Payer: Medicare Other | Source: Ambulatory Visit | Attending: Cardiothoracic Surgery | Admitting: Cardiothoracic Surgery

## 2011-11-26 VITALS — BP 119/71 | HR 110 | Resp 16 | Ht 71.0 in | Wt 154.0 lb

## 2011-11-26 DIAGNOSIS — C3490 Malignant neoplasm of unspecified part of unspecified bronchus or lung: Secondary | ICD-10-CM

## 2011-11-26 DIAGNOSIS — Z09 Encounter for follow-up examination after completed treatment for conditions other than malignant neoplasm: Secondary | ICD-10-CM

## 2011-11-26 DIAGNOSIS — C3430 Malignant neoplasm of lower lobe, unspecified bronchus or lung: Secondary | ICD-10-CM

## 2011-11-26 NOTE — Progress Notes (Signed)
                                                   [address]            [name] [address]          [phone]     HPI: Status post recent left VATS procedure for segmentectomy for .pT1b, pNX, MX.  Seen in office today for a wound check. The mini  express was removed 2 weeks ago. He is having no fevers, chills or other constitutional symptoms.Was cutting his grass this week. He had some  nausea, no vomiting. Course of antibiotics completed  Current Outpatient Prescriptions  Medication Sig Dispense Refill  . albuterol (PROVENTIL,VENTOLIN) 90 MCG/ACT inhaler Inhale 2 puffs into the lungs every 6 (six) hours as needed. For wheezing or shortness of breath      . DULoxetine (CYMBALTA) 60 MG capsule Take 60 mg by mouth daily.      Marland Kitchen losartan (COZAAR) 50 MG tablet Take 50 mg by mouth daily.      . Multiple Vitamin (MULTIVITAMIN) tablet Take 1 tablet by mouth daily.      . nitroGLYCERIN (NITROSTAT) 0.4 MG SL tablet Place 0.4 mg under the tongue every 5 (five) minutes as needed. For chest pain      . Rivaroxaban (XARELTO) 20 MG TABS Take 20 mg by mouth daily.  30 tablet  6  . DISCONTD: albuterol (PROVENTIL,VENTOLIN) 90 MCG/ACT inhaler Inhale 2 puffs into the lungs every 6 (six) hours as needed for wheezing or shortness of breath.  17 g  3    Physical Exam: Incision-healing well without evidence of infection. Incision well healed as is chest tube site Abdomen soft with out tenderness  Diagnostic Tests: Dg Chest 2 View  11/26/2011  *RADIOLOGY REPORT*  Clinical Data: Follow up lung cancer status post wedge resection  CHEST - 2 VIEW  Comparison: 11/16/2011  Findings: Stable air-fluid level in the left upper lung. Additional stable lateral left mid lung opacities.  Underlying chronic interstitial markings/emphysematous changes. New blunting of the left costophrenic angle without definite pleural effusion.  Heart is normal in size.  Right subclavian pacemaker.  Degenerative changes of  the visualized thoracolumbar spine.  IMPRESSION: Stable left upper/mid lung opacities, as described above.   Original Report Authenticated By: Charline Bills, M.D.     Impression: Incisions healing well  Plan: Follow up chest xray one month  [name] MD  Beeper [phone] Office [phone] 11/26/2011 12:56 PM

## 2011-11-26 NOTE — Patient Instructions (Signed)
Call for fever chills or increasing cough

## 2011-12-23 ENCOUNTER — Other Ambulatory Visit: Payer: Self-pay | Admitting: Cardiothoracic Surgery

## 2011-12-23 DIAGNOSIS — D381 Neoplasm of uncertain behavior of trachea, bronchus and lung: Secondary | ICD-10-CM

## 2011-12-24 ENCOUNTER — Ambulatory Visit: Payer: Self-pay | Admitting: Cardiothoracic Surgery

## 2011-12-25 ENCOUNTER — Ambulatory Visit (INDEPENDENT_AMBULATORY_CARE_PROVIDER_SITE_OTHER): Payer: Self-pay | Admitting: Cardiothoracic Surgery

## 2011-12-25 ENCOUNTER — Encounter: Payer: Self-pay | Admitting: Cardiothoracic Surgery

## 2011-12-25 ENCOUNTER — Ambulatory Visit
Admission: RE | Admit: 2011-12-25 | Discharge: 2011-12-25 | Disposition: A | Discharge status: Home / Self Care | Payer: Medicare Other | Source: Ambulatory Visit | Attending: Cardiothoracic Surgery | Admitting: Cardiothoracic Surgery

## 2011-12-25 VITALS — BP 122/67 | HR 92 | Resp 18 | Ht 71.0 in | Wt 159.0 lb

## 2011-12-25 DIAGNOSIS — Z09 Encounter for follow-up examination after completed treatment for conditions other than malignant neoplasm: Secondary | ICD-10-CM

## 2011-12-25 DIAGNOSIS — C3430 Malignant neoplasm of lower lobe, unspecified bronchus or lung: Secondary | ICD-10-CM

## 2011-12-25 DIAGNOSIS — D381 Neoplasm of uncertain behavior of trachea, bronchus and lung: Secondary | ICD-10-CM

## 2011-12-25 NOTE — Patient Instructions (Addendum)
Doing well Chest xray looks good Return in 6 months for follow up CT scan of chest to follow up on Stage I Lung cancer

## 2011-12-25 NOTE — Progress Notes (Signed)
[address]            [place] [address]          [phone]       [name] [facility] Medical Record #[id number] Date of Birth: 09-Feb-1933  [name], * [name] Mask, MD  Chief Complaint:   PostOp Follow Up Visit 10/20/2011  DATE OF DISCHARGE:  OPERATIVE REPORT  Preop: Non small cell Lung cancer- left  Postop: same  SURGICAL PROCEDURE: Repeat left Mini thoracotomy, wedge resection, and  segmentectomy of the superior segment of the left lower lobe, and  placement of On-Q device.    pT1b, pNX, MX. INVASIVE MODERATELY DIFFERENTIATED ADENOCARCINOMA, SPANNING 2.2 CM IN GREATEST DIMENSION.   Wedge resection 10/20/2011   History of Present Illness:      Feels better, back working on [employer] Mild SOB with exertion but improving. No cough. Eating better   History  Smoking status  . Former Smoker -- 1.0 packs/day for 50 years  . Types: Cigarettes  . Quit date: 03/23/2004  Smokeless tobacco  . Current User  . Types: Chew       Allergies  Allergen Reactions  . Iohexol      Desc: PT STATED TODAY HE GETS SLIGHT SOB FROM IV CONTRAST. HE NEEDS FULL PREMEDS FROM NOW ON PER DR. MATTERN   . Penicillins Itching and Rash    "haven't took none in 40 years"    Current Outpatient Prescriptions  Medication Sig Dispense Refill  . albuterol (PROVENTIL,VENTOLIN) 90 MCG/ACT inhaler Inhale 2 puffs into the lungs every 6 (six) hours as needed. For wheezing or shortness of breath      . DULoxetine (CYMBALTA) 60 MG capsule Take 60 mg by mouth daily.      Marland Kitchen losartan (COZAAR) 50 MG tablet Take 50 mg by mouth daily.      . Multiple Vitamin (MULTIVITAMIN) tablet Take 1 tablet by mouth daily.      . nitroGLYCERIN (NITROSTAT) 0.4 MG SL tablet Place 0.4 mg under the tongue every 5 (five) minutes as needed. For chest pain      . Rivaroxaban (XARELTO) 20 MG TABS Take 20 mg by mouth daily.  30 tablet  6  . DISCONTD:  albuterol (PROVENTIL,VENTOLIN) 90 MCG/ACT inhaler Inhale 2 puffs into the lungs every 6 (six) hours as needed for wheezing or shortness of breath.  17 g  3       Physical Exam: BP 122/67  Pulse 92  Resp 18  Ht 5\' 11"  (1.803 m)  Wt 159 lb (72.122 kg)  BMI 22.18 kg/m2  SpO2 99%  General appearance: alert and cooperative Neurologic: intact Heart: regular rate and rhythm, S1, S2 normal, no murmur, click, rub or gallop and normal apical impulse Lungs: clear to auscultation bilaterally and normal percussion bilaterally Abdomen: soft, non-tender; bowel sounds normal; no masses,  no organomegaly Extremities: extremities normal, atraumatic, no cyanosis or edema and Homans sign is negative, no sign of DVT Wounds:well healed.  No adenopathy  Diagnostic Studies & Laboratory data:         Recent Radiology Findings: Dg Chest 2 View  12/25/2011  *RADIOLOGY REPORT*  Clinical Data: Wedge resection of non-small cell lung carcinoma in the left lower lobe, follow-up  CHEST - 2 VIEW  Comparison: Chest x-ray of 11/26/2011  Findings: Postoperative changes are stable within the left  upper hemithorax with some volume loss, pleural sutures, and apical scarring.  The right lung is clear and hyperaerated.  Mediastinal contours are stable.  The heart is within normal limits in size.  A permanent pacemaker remains.  There are degenerative changes diffusely throughout the thoracic spine.  IMPRESSION: Stable postop changes in the left upper hemithorax.   Original Report Authenticated By: Juline Patch, M.D.       Recent Labs: Lab Results  Component Value Date   WBC 8.8 10/24/2011   HGB 12.4* 10/24/2011   HCT 35.4* 10/24/2011   PLT 239 10/24/2011   GLUCOSE 94 10/24/2011   CHOL 192 07/22/2011   TRIG 76 07/22/2011   HDL 57 07/22/2011   LDLCALC 478* 07/22/2011   ALT 11 10/22/2011   AST 19 10/22/2011   NA 139 10/24/2011   K 3.9 10/24/2011   CL 104 10/24/2011   CREATININE 0.97 10/24/2011   BUN 12 10/24/2011   CO2 27 10/24/2011   TSH  4.001 06/13/2011   INR 0.97 10/16/2011   HGBA1C 5.0 06/13/2011      Assessment / Plan:     Doing well postop for wedge resection of Stage I INVASIVE MODERATELY DIFFERENTIATED ADENOCARCINOMA, SPANNING 2.2 CM IN GREATEST DIMENSION.  Will get repeat follow up ct scan in 6 months  Discussed with patient the need for follow up and the review of his case at [facility]  Thoracic Oncology Conference and recommendation that no further RX is  needed.    [name] 12/25/2011 9:54 AM

## 2012-01-13 ENCOUNTER — Telehealth: Payer: Self-pay | Admitting: Internal Medicine

## 2012-01-13 NOTE — Telephone Encounter (Signed)
New Problem:    Called in because patient has a procedure coming up on 02/04/12 and they need to take patient off of Rivaroxaban (XARELTO) 20 MG TABS for 7 days prior.  Sent fax yesterday but have not heard word back yet.  Please either call or fax: [phone].

## 2012-01-13 NOTE — Telephone Encounter (Signed)
Will fax tomorrow

## 2012-02-04 ENCOUNTER — Other Ambulatory Visit: Payer: Self-pay | Admitting: Gastroenterology

## 2012-02-15 ENCOUNTER — Ambulatory Visit (INDEPENDENT_AMBULATORY_CARE_PROVIDER_SITE_OTHER): Payer: Medicare Other | Admitting: *Deleted

## 2012-02-15 ENCOUNTER — Encounter: Payer: Self-pay | Admitting: Internal Medicine

## 2012-02-15 DIAGNOSIS — Z950 Presence of cardiac pacemaker: Secondary | ICD-10-CM

## 2012-02-15 DIAGNOSIS — I442 Atrioventricular block, complete: Secondary | ICD-10-CM

## 2012-02-17 LAB — REMOTE PACEMAKER DEVICE
AL AMPLITUDE: 4.1 mv
AL IMPEDENCE PM: 440 Ohm
ATRIAL PACING PM: 1
BATTERY VOLTAGE: 2.95 V
VENTRICULAR PACING PM: 1

## 2012-03-02 ENCOUNTER — Encounter: Payer: Self-pay | Admitting: *Deleted

## 2012-03-07 ENCOUNTER — Telehealth: Payer: Self-pay | Admitting: Internal Medicine

## 2012-03-07 NOTE — Telephone Encounter (Signed)
I spoke to the patients wife.  She says these are the same symptoms he has been having since before starting the medication. I have advised her to have him follow up with his PCP as this I do not think is related to the mediccation as he has been on it since 05/2011.  They are going to PCP and see if he can get a referral to a neurologist for the symptoms which he is having.  They will call me back if need any help any further

## 2012-03-07 NOTE — Telephone Encounter (Signed)
plz return call to pt hm# regarding possible allergic reaction to xaralto medication. Pt c/o nausea and itching would like to be seen today .

## 2012-03-24 ENCOUNTER — Other Ambulatory Visit: Payer: Self-pay

## 2012-03-24 DIAGNOSIS — I2699 Other pulmonary embolism without acute cor pulmonale: Secondary | ICD-10-CM

## 2012-03-24 MED ORDER — RIVAROXABAN 20 MG PO TABS: 20.0000 mg | ORAL_TABLET | Freq: Every day | ORAL | Status: DC

## 2012-03-25 ENCOUNTER — Encounter: Payer: Self-pay | Admitting: Internal Medicine

## 2012-06-10 ENCOUNTER — Other Ambulatory Visit: Payer: Self-pay | Admitting: *Deleted

## 2012-06-14 ENCOUNTER — Other Ambulatory Visit: Payer: Self-pay | Admitting: *Deleted

## 2012-06-15 ENCOUNTER — Other Ambulatory Visit: Payer: Self-pay | Admitting: *Deleted

## 2012-06-15 DIAGNOSIS — C3490 Malignant neoplasm of unspecified part of unspecified bronchus or lung: Secondary | ICD-10-CM

## 2012-06-17 ENCOUNTER — Encounter: Payer: Self-pay | Admitting: Cardiology

## 2012-06-17 ENCOUNTER — Ambulatory Visit (INDEPENDENT_AMBULATORY_CARE_PROVIDER_SITE_OTHER): Payer: Medicare Other | Admitting: Cardiology

## 2012-06-17 ENCOUNTER — Encounter: Payer: Self-pay | Admitting: Internal Medicine

## 2012-06-17 VITALS — BP 143/80 | HR 95 | Ht 71.0 in | Wt 167.4 lb

## 2012-06-17 DIAGNOSIS — Z950 Presence of cardiac pacemaker: Secondary | ICD-10-CM

## 2012-06-17 DIAGNOSIS — I472 Ventricular tachycardia, unspecified: Secondary | ICD-10-CM

## 2012-06-17 DIAGNOSIS — I442 Atrioventricular block, complete: Secondary | ICD-10-CM

## 2012-06-17 DIAGNOSIS — I2510 Atherosclerotic heart disease of native coronary artery without angina pectoris: Secondary | ICD-10-CM

## 2012-06-17 LAB — PACEMAKER DEVICE OBSERVATION
AL AMPLITUDE: 3.4 mv
AL THRESHOLD: 1.5 V
AL THRESHOLD: 1.5 V
BAMS-0003: 65 {beats}/min
BATTERY VOLTAGE: 2.95 V
DEVICE MODEL PM: 7167612
RV LEAD AMPLITUDE: 7.2 mv
RV LEAD THRESHOLD: 1.75 V
VENTRICULAR PACING PM: 0

## 2012-06-17 MED ORDER — NITROGLYCERIN 0.4 MG SL SUBL: 0.4000 mg | SUBLINGUAL_TABLET | SUBLINGUAL | Status: DC | PRN

## 2012-06-17 NOTE — Patient Instructions (Signed)
.  Remote monitoring is used to monitor your Pacemaker of ICD from home. This monitoring reduces the number of office visits required to check your device to one time per year. It allows [place] to keep an eye on the functioning of your device to ensure it is working properly. You are scheduled for a device check from home on 09/19/2012. You may send your transmission at any time that day. If you have a wireless device, the transmission will be sent automatically. After your physician reviews your transmission, you will receive a postcard with your next transmission date.  Your physician wants you to follow-up in: 12 months with Dr. Johney Frame. You will receive a reminder letter in the mail two months in advance. If you don't receive a letter, please call our office to schedule the follow-up appointment.

## 2012-06-17 NOTE — Progress Notes (Signed)
ELECTROPHYSIOLOGY OFFICE NOTE  Patient ID: [name] MRN: [id number], DOB/AGE: [birth year]   Date of Visit: 06/17/2012  Primary Physician: [name], MD Primary EP: Johney Frame, MD Reason for Visit: EP/device follow-up  History of Present Illness  [name] is a 79 year old man with CHB s/p PPM implant, nonobstructive CAD s/p cath May 2013 and COPD who presents today for routine electrophysiology followup. Since last being seen in our clinic, he reports he is doing well. He has no cardiac complaints. Today, he specifically denies chest pain or shortness of breath. He denies palpitations, dizziness, near syncope or syncope. He denies LE swelling, orthopnea, PND or recent weight gain. [name] reports that he is compliant and tolerating medications without difficulty.  Past Medical History Past Medical History  Diagnosis Date  . Hyperlipidemia   . Hypertension   . Esophageal reflux   . Diverticulosis   . Rheumatic fever ~ 1944  . Vision loss of left eye     S/P "cataract OR"; "can see a little; not much"  . Kidney stones   . Hard of hearing, left   . Pulmonary embolism     On Xarelto  . Hypercalcemia   . Arthritis     "knees"  . CAD (coronary artery disease)     nonobstructive by cath 07/22/11 with small to moderate sized diagonal branch with ostial stenosis , medical therapy advised  . Prostate cancer     s/p cryotherapy  . AV block     s/p PPM  . COPD (chronic obstructive pulmonary disease)   . Carotid artery aneurysm     Bilateral internal carotid artery aneurysms followed by Dr. Arbie Cookey  . Shortness of breath     with ambulation  . Constipation     Past Surgical History Past Surgical History  Procedure Laterality Date  . Cystoscopy    . Lung surgery  1957    "born [place]b [place]  . Insert / replace / remove pacemaker  1991    initial placement  . Insert / replace / remove pacemaker  ~ 2011    "changed out"  . Tonsillectomy and adenoidectomy  1943    . Appendectomy  2000's  . Cholecystectomy  2000's  . Cataract extraction w/ intraocular lens implant  ~ 2008    left eye  . Lithotripsy  ~ 2011    "twice"  . Cardiac catheterization    . Prostate cryoablation  ~ 2008  . Video bronchoscopy  10/20/2011    Procedure: VIDEO BRONCHOSCOPY;  Surgeon: [name], MD;  Location: [facility] OR;  Service: Thoracic;  Laterality: N/A;     Allergies/Intolerances Allergies  Allergen Reactions  . Iohexol      Desc: PT STATED TODAY HE GETS SLIGHT SOB FROM IV CONTRAST. HE NEEDS FULL PREMEDS FROM NOW ON PER DR. MATTERN   . Penicillins Itching and Rash    "haven't took none in 40 years"    Current Home Medications Current Outpatient Prescriptions  Medication Sig Dispense Refill  . albuterol (PROVENTIL,VENTOLIN) 90 MCG/ACT inhaler Inhale 2 puffs into the lungs every 6 (six) hours as needed. For wheezing or shortness of breath      . DULoxetine (CYMBALTA) 60 MG capsule Take 60 mg by mouth daily.      Marland Kitchen ibuprofen (ADVIL) 200 MG tablet Take 200 mg by mouth as needed for pain.      Marland Kitchen losartan (COZAAR) 50 MG tablet Take 50 mg by mouth daily.      Marland Kitchen  Multiple Vitamin (MULTIVITAMIN) tablet Take 1 tablet by mouth daily.      . nitroGLYCERIN (NITROSTAT) 0.4 MG SL tablet Place 1 tablet (0.4 mg total) under the tongue every 5 (five) minutes as needed. For chest pain  30 tablet  3  . Rivaroxaban (XARELTO) 20 MG TABS Take 1 tablet (20 mg total) by mouth daily.  30 tablet  4  . traZODone (DESYREL) 150 MG tablet Take 150 mg by mouth as directed. 1/2 -1 tablet by mouth at bedtime       No current facility-administered medications for this visit.    Social History Social History  . Marital Status: Married   Occupational History  . Retired     Naval architect    Social History Main Topics  . Smoking status: Former Smoker -- 1.00 packs/day for 50 years    Types: Cigarettes    Quit date: 03/23/2004  . Smokeless tobacco: Current User    Types: Chew  . Alcohol  Use: No  . Drug Use: No   Review of Systems General: No chills, fever, night sweats or weight changes Cardiovascular: No chest pain, dyspnea on exertion, edema, orthopnea, palpitations, paroxysmal nocturnal dyspnea Dermatological: No rash, lesions or masses Respiratory: No cough, dyspnea Urologic: No hematuria, dysuria Abdominal: No nausea, vomiting, diarrhea, bright red blood per rectum, melena, or hematemesis Neurologic: No visual changes, weakness, changes in mental status All other systems reviewed and are otherwise negative except as noted above.  Physical Exam Blood pressure 143/80, pulse 95, height 5\' 11"  (1.803 m), weight 167 lb 6.4 oz (75.932 kg).  General: Well developed, well appearing 79 year old male in no acute distress. HEENT: Normocephalic, atraumatic. EOMs intact. Sclera nonicteric. Oropharynx clear.  Neck: Supple without bruits. No JVD. Lungs: Respirations regular and unlabored. Diminished breath sounds throughout but CTA bilaterally. No wheezes, rales or rhonchi. Heart: RRR. S1, S2 present. No murmurs, rub, S3 or S4. Abdomen: Soft, non-tender, non-distended. BS present x 4 quadrants. No hepatosplenomegaly.  Extremities: No clubbing, cyanosis or edema. DP/PT/Radials 2+ and equal bilaterally. Psych: Normal affect. Neuro: Alert and oriented X 3. Moves all extremities spontaneously.   Diagnostics Device interrogation today - Normal device function. Thresholds, sensing, impedances consistent with previous measurements. Chronically elevated thresholds but stable. Device programmed to maximize longevity. 104 mode switch episodes with 27 EGMs available, longest 5 hours 10 minutes, EGMs consistent with sinus tach or atrial tach. 2 high ventricular rates noted, brief, EGMs reviewed - one episode consistent with NSVT, the other is an atrial tach with 1:1 conduction. Device programmed at appropriate safety margins. Histogram distribution appropriate for patient activity level. Device  programmed to optimize intrinsic conduction. Estimated longevity 3.6-8.7 years.   Assessment and Plan 1. CHB s/p PPM Normal PPM function; see details above No programming changes made Continue routine remote device follow-up every 3 months Return to clinic for follow-up with Dr. Johney Frame in one year 2. NSVT One episode, asymptomatic Normal LV function Nonobstructive CAD by cath May 2013 If recurrent, consider adding BB but cautiously given his COPD 3. CAD Stable; nonobstructive by cath May 2013 No anginal symptoms 4. History of PE On Xarelto  Limmie Patricia, PA-C 06/17/2012, 9:53 AM

## 2012-07-13 LAB — BUN: BUN: 14 mg/dL (ref 6–23)

## 2012-07-14 ENCOUNTER — Ambulatory Visit (INDEPENDENT_AMBULATORY_CARE_PROVIDER_SITE_OTHER): Payer: Medicare Other | Admitting: Cardiothoracic Surgery

## 2012-07-14 ENCOUNTER — Ambulatory Visit
Admission: RE | Admit: 2012-07-14 | Discharge: 2012-07-14 | Disposition: A | Discharge status: Home / Self Care | Payer: Medicare Other | Source: Ambulatory Visit | Attending: Cardiothoracic Surgery | Admitting: Cardiothoracic Surgery

## 2012-07-14 ENCOUNTER — Encounter: Payer: Self-pay | Admitting: Cardiothoracic Surgery

## 2012-07-14 DIAGNOSIS — C3490 Malignant neoplasm of unspecified part of unspecified bronchus or lung: Secondary | ICD-10-CM

## 2012-07-14 NOTE — Progress Notes (Signed)
[address]       [place] [address]             [phone]         [name] [facility] Medical Record #[id number] Date of Birth: 03/06/1933  [name], * [name] Mask, MD  Chief Complaint:   PostOp Follow Up Visit 10/20/2011  DATE OF DISCHARGE:  OPERATIVE REPORT  Preop: Non small cell Lung cancer- left  Postop: same  SURGICAL PROCEDURE: Repeat left Mini thoracotomy, wedge resection, and  segmentectomy of the superior segment of the left lower lobe, and  placement of On-Q device.    pT1b, pNX, MX. INVASIVE MODERATELY DIFFERENTIATED ADENOCARCINOMA, SPANNING 2.2 CM IN GREATEST DIMENSION.   Wedge resection 10/20/2011   History of Present Illness:      Feels better, back working on [employer] Mild SOB with exertion but improving. No cough. Eating better   History  Smoking status  . Former Smoker -- 1.00 packs/day for 50 years  . Types: Cigarettes  . Quit date: 03/23/2004  Smokeless tobacco  . Current User  . Types: Chew       Allergies  Allergen Reactions  . Iohexol      Desc: PT STATED TODAY HE GETS SLIGHT SOB FROM IV CONTRAST. HE NEEDS FULL PREMEDS FROM NOW ON PER DR. MATTERN   . Penicillins Itching and Rash    "haven't took none in 40 years"    Current Outpatient Prescriptions  Medication Sig Dispense Refill  . DULoxetine (CYMBALTA) 60 MG capsule Take 60 mg by mouth daily.      Marland Kitchen ibuprofen (ADVIL) 200 MG tablet Take 200 mg by mouth as needed for pain.      Marland Kitchen losartan (COZAAR) 50 MG tablet Take 50 mg by mouth daily.      . Multiple Vitamin (MULTIVITAMIN) tablet Take 1 tablet by mouth daily.      . nitroGLYCERIN (NITROSTAT) 0.4 MG SL tablet Place 1 tablet (0.4 mg total) under the tongue every 5 (five) minutes as needed. For chest pain  30 tablet  3  . Rivaroxaban (XARELTO) 20 MG TABS Take 1 tablet (20 mg total) by mouth daily.  30 tablet  4  . traZODone (DESYREL) 150 MG tablet Take 150  mg by mouth as directed. 1/2 -1 tablet by mouth at bedtime      . albuterol (PROVENTIL,VENTOLIN) 90 MCG/ACT inhaler Inhale 2 puffs into the lungs every 6 (six) hours as needed. For wheezing or shortness of breath       No current facility-administered medications for this visit.       Physical Exam: BP 135/78  Pulse 100  Resp 20  Ht 5\' 11"  (1.803 m)  Wt 167 lb (75.751 kg)  BMI 23.3 kg/m2  SpO2 98%  General appearance: alert and cooperative Neurologic: intact Heart: regular rate and rhythm, S1, S2 normal, no murmur, click, rub or gallop and normal apical impulse Lungs: clear to auscultation bilaterally and normal percussion bilaterally Abdomen: soft, non-tender; bowel sounds normal; no masses,  no organomegaly Extremities: extremities normal, atraumatic, no cyanosis or edema and Homans sign is negative, no sign of DVT Wounds:well healed.  No adenopathy  Diagnostic Studies & Laboratory data:         Recent Radiology Findings: Ct Chest Wo Contrast  07/14/2012  *RADIOLOGY REPORT*  Clinical Data: Follow-up lung cancer.  CT CHEST WITHOUT CONTRAST  Technique:  Multidetector CT imaging of the chest was performed following the standard protocol without IV contrast.  Comparison: CT scan 08/18/2011.  Findings: The chest wall is stable.  A permanent right-sided pacemaker is noted.  No supraclavicular or axillary lymphadenopathy.  The bony thorax is intact.  No destructive bone lesions or spinal canal compromise.  Moderate degenerative changes involving the spine.  The heart is normal in size.  No pericardial effusion.  No mediastinal or hilar lymphadenopathy.  Stable tortuosity and ectasia of the thoracic aorta.  The esophagus is grossly normal.  Examination of the lung parenchyma demonstrates stable advanced emphysematous changes and pulmonary interstitial scarring.  The left upper lobe pulmonary nodule has been excised.  No findings for recurrent or residual tumor.  There is a new left apical  lung density.  This could reflect progressive scarring change but does need observation.  There is a 7 mm nodule at the right lung base.  This is difficult to see on the prior CT but was a on the prior PET CT and needs continued observation.  There is an ill-defined nodular density measuring approximate 5 mm in the superior segment of the left lower lobe on image number 31. I believe this was present on the prior CT scan although it is more obvious now.  A 4 mm nodule is noted on image number 22 in the left upper lobe. This was present on the prior study and has not significantly changed.  No acute pulmonary findings or pleural effusion.  The upper abdomen is unremarkable.  Stable right renal cyst.  IMPRESSION:  1.  Surgical changes from prior excision of a left apical lung neoplasm. 2.  Advanced emphysematous changes and pulmonary scarring. 3.  New 13 x 8 mm left apical lung density.  This could reflect scarring change but needs close observation. 4.  Other small pulmonary nodules as discussed above.  Recommend continued observation. 5.  No acute pulmonary findings and no mediastinal or hilar lymphadenopathy. 6.  Stable tortuosity and ectasia of the thoracic aorta.   Original Report Authenticated By: Rudie Meyer, M.D.       Recent Labs: Lab Results  Component Value Date   WBC 8.8 10/24/2011   HGB 12.4* 10/24/2011   HCT 35.4* 10/24/2011   PLT 239 10/24/2011   GLUCOSE 94 10/24/2011   CHOL 192 07/22/2011   TRIG 76 07/22/2011   HDL 57 07/22/2011   LDLCALC 696* 07/22/2011   ALT 11 10/22/2011   AST 19 10/22/2011   NA 139 10/24/2011   K 3.9 10/24/2011   CL 104 10/24/2011   CREATININE 0.97 10/24/2011   BUN 14 07/12/2012   CO2 27 10/24/2011   TSH 4.001 06/13/2011   INR 0.97 10/16/2011   HGBA1C 5.0 06/13/2011      Assessment / Plan:     Doing well postop for wedge resection of Stage I INVASIVE MODERATELY DIFFERENTIATED ADENOCARCINOMA, SPANNING 2.2 CM IN GREATEST DIMENSION.  Followup CT scan is reviewed with the patient,  patient has diffuse scarring throughout the lungs, but of concern is a slightly more prominent area of scarring at the left apex. This could be another primary lung cancer versus progressive scarring I discussed this with the patient. We will decrease the time interval of routine followup CT scan of the chest to 4 months. Followup CT scan will be a super D. scan so if we do see enlargement we can proceed with biopsy of the left upper lobe  lesion. Will get repeat follow up ct scan in 4 months, Super D scan.      Shatoya Roets B 07/14/2012 5:24 PM

## 2012-07-18 ENCOUNTER — Encounter: Payer: Self-pay | Admitting: Gastroenterology

## 2012-08-05 ENCOUNTER — Other Ambulatory Visit: Payer: Self-pay | Admitting: Adult Health

## 2012-08-05 ENCOUNTER — Ambulatory Visit
Admission: RE | Admit: 2012-08-05 | Discharge: 2012-08-05 | Disposition: A | Discharge status: Home / Self Care | Payer: Medicare Other | Source: Ambulatory Visit | Attending: Adult Health | Admitting: Adult Health

## 2012-08-05 DIAGNOSIS — M25551 Pain in right hip: Secondary | ICD-10-CM

## 2012-08-05 DIAGNOSIS — M25552 Pain in left hip: Secondary | ICD-10-CM

## 2012-08-25 ENCOUNTER — Other Ambulatory Visit: Payer: Self-pay | Admitting: Emergency Medicine

## 2012-08-25 DIAGNOSIS — I2699 Other pulmonary embolism without acute cor pulmonale: Secondary | ICD-10-CM

## 2012-08-25 MED ORDER — RIVAROXABAN 20 MG PO TABS: 20.0000 mg | ORAL_TABLET | Freq: Every day | ORAL | Status: DC

## 2012-09-19 ENCOUNTER — Encounter: Payer: Self-pay | Admitting: Internal Medicine

## 2012-09-19 ENCOUNTER — Ambulatory Visit (INDEPENDENT_AMBULATORY_CARE_PROVIDER_SITE_OTHER): Payer: Medicare Other | Admitting: *Deleted

## 2012-09-19 DIAGNOSIS — I442 Atrioventricular block, complete: Secondary | ICD-10-CM

## 2012-09-19 DIAGNOSIS — Z950 Presence of cardiac pacemaker: Secondary | ICD-10-CM

## 2012-09-21 LAB — REMOTE PACEMAKER DEVICE
AL IMPEDENCE PM: 430 Ohm
BATTERY VOLTAGE: 2.96 V
RV LEAD AMPLITUDE: 6.8 mv
VENTRICULAR PACING PM: 1

## 2012-09-27 ENCOUNTER — Other Ambulatory Visit: Payer: Self-pay | Admitting: *Deleted

## 2012-09-28 ENCOUNTER — Encounter: Payer: Self-pay | Admitting: *Deleted

## 2012-10-20 ENCOUNTER — Other Ambulatory Visit: Payer: Self-pay

## 2012-10-20 DIAGNOSIS — D381 Neoplasm of uncertain behavior of trachea, bronchus and lung: Secondary | ICD-10-CM

## 2012-11-13 ENCOUNTER — Emergency Department (HOSPITAL_COMMUNITY): Payer: Medicare Other

## 2012-11-13 ENCOUNTER — Emergency Department (HOSPITAL_COMMUNITY)
Admission: EM | Admit: 2012-11-13 | Discharge: 2012-11-13 | Disposition: A | Discharge status: Home / Self Care | Payer: Medicare Other | Attending: Emergency Medicine | Admitting: Emergency Medicine

## 2012-11-13 ENCOUNTER — Encounter (HOSPITAL_COMMUNITY): Payer: Self-pay | Admitting: Emergency Medicine

## 2012-11-13 DIAGNOSIS — Z950 Presence of cardiac pacemaker: Secondary | ICD-10-CM | POA: Insufficient documentation

## 2012-11-13 DIAGNOSIS — Z87442 Personal history of urinary calculi: Secondary | ICD-10-CM | POA: Insufficient documentation

## 2012-11-13 DIAGNOSIS — I2510 Atherosclerotic heart disease of native coronary artery without angina pectoris: Secondary | ICD-10-CM | POA: Insufficient documentation

## 2012-11-13 DIAGNOSIS — Z8719 Personal history of other diseases of the digestive system: Secondary | ICD-10-CM | POA: Insufficient documentation

## 2012-11-13 DIAGNOSIS — Z8546 Personal history of malignant neoplasm of prostate: Secondary | ICD-10-CM | POA: Insufficient documentation

## 2012-11-13 DIAGNOSIS — Y9289 Other specified places as the place of occurrence of the external cause: Secondary | ICD-10-CM | POA: Insufficient documentation

## 2012-11-13 DIAGNOSIS — I10 Essential (primary) hypertension: Secondary | ICD-10-CM | POA: Insufficient documentation

## 2012-11-13 DIAGNOSIS — Z862 Personal history of diseases of the blood and blood-forming organs and certain disorders involving the immune mechanism: Secondary | ICD-10-CM | POA: Insufficient documentation

## 2012-11-13 DIAGNOSIS — Z8739 Personal history of other diseases of the musculoskeletal system and connective tissue: Secondary | ICD-10-CM | POA: Insufficient documentation

## 2012-11-13 DIAGNOSIS — Z87891 Personal history of nicotine dependence: Secondary | ICD-10-CM | POA: Insufficient documentation

## 2012-11-13 DIAGNOSIS — H5460 Unqualified visual loss, one eye, unspecified: Secondary | ICD-10-CM | POA: Insufficient documentation

## 2012-11-13 DIAGNOSIS — Z86711 Personal history of pulmonary embolism: Secondary | ICD-10-CM | POA: Insufficient documentation

## 2012-11-13 DIAGNOSIS — Y9389 Activity, other specified: Secondary | ICD-10-CM | POA: Insufficient documentation

## 2012-11-13 DIAGNOSIS — M542 Cervicalgia: Secondary | ICD-10-CM | POA: Insufficient documentation

## 2012-11-13 DIAGNOSIS — Z8679 Personal history of other diseases of the circulatory system: Secondary | ICD-10-CM | POA: Insufficient documentation

## 2012-11-13 DIAGNOSIS — S298XXA Other specified injuries of thorax, initial encounter: Secondary | ICD-10-CM | POA: Insufficient documentation

## 2012-11-13 DIAGNOSIS — X500XXA Overexertion from strenuous movement or load, initial encounter: Secondary | ICD-10-CM | POA: Insufficient documentation

## 2012-11-13 DIAGNOSIS — J449 Chronic obstructive pulmonary disease, unspecified: Secondary | ICD-10-CM | POA: Insufficient documentation

## 2012-11-13 DIAGNOSIS — J4489 Other specified chronic obstructive pulmonary disease: Secondary | ICD-10-CM | POA: Insufficient documentation

## 2012-11-13 DIAGNOSIS — Z8639 Personal history of other endocrine, nutritional and metabolic disease: Secondary | ICD-10-CM | POA: Insufficient documentation

## 2012-11-13 DIAGNOSIS — Z79899 Other long term (current) drug therapy: Secondary | ICD-10-CM | POA: Insufficient documentation

## 2012-11-13 DIAGNOSIS — Z880 Allergy status to penicillin: Secondary | ICD-10-CM | POA: Insufficient documentation

## 2012-11-13 DIAGNOSIS — Z8669 Personal history of other diseases of the nervous system and sense organs: Secondary | ICD-10-CM | POA: Insufficient documentation

## 2012-11-13 DIAGNOSIS — R911 Solitary pulmonary nodule: Secondary | ICD-10-CM | POA: Insufficient documentation

## 2012-11-13 DIAGNOSIS — S0990XA Unspecified injury of head, initial encounter: Secondary | ICD-10-CM | POA: Insufficient documentation

## 2012-11-13 DIAGNOSIS — R5381 Other malaise: Secondary | ICD-10-CM | POA: Insufficient documentation

## 2012-11-13 LAB — BASIC METABOLIC PANEL
Chloride: 103 mEq/L (ref 96–112)
GFR calc Af Amer: 89 mL/min — ABNORMAL LOW (ref >90)
Potassium: 3.6 mEq/L (ref 3.5–5.1)
Sodium: 138 mEq/L (ref 135–145)

## 2012-11-13 LAB — CBC WITH DIFFERENTIAL/PLATELET
Basophils Absolute: 0 10*3/uL (ref 0.0–0.1)
Basophils Relative: 0 % (ref 0–1)
Hemoglobin: 15.3 g/dL (ref 13.0–17.0)
MCHC: 35.7 g/dL (ref 30.0–36.0)
Monocytes Relative: 9 % (ref 3–12)
Neutro Abs: 5.4 10*3/uL (ref 1.7–7.7)
Neutrophils Relative %: 65 % (ref 43–77)
RDW: 12.3 % (ref 11.5–15.5)

## 2012-11-13 LAB — POCT I-STAT TROPONIN I: Troponin i, poc: 0.01 ng/mL (ref 0.00–0.08)

## 2012-11-13 MED ORDER — IOHEXOL 350 MG/ML SOLN
50.0000 mL | Freq: Once | INTRAVENOUS | Status: AC | PRN
  Administered 2012-11-13: 50 mL via INTRAVENOUS

## 2012-11-13 MED ORDER — DIPHENHYDRAMINE HCL 50 MG/ML IJ SOLN
50.0000 mg | Freq: Once | INTRAMUSCULAR | Status: AC
  Administered 2012-11-13: 50 mg via INTRAVENOUS
  Filled 2012-11-13: qty 1

## 2012-11-13 MED ORDER — METHYLPREDNISOLONE SODIUM SUCC 40 MG IJ SOLR
40.0000 mg | Freq: Once | INTRAMUSCULAR | Status: AC
  Administered 2012-11-13: 40 mg via INTRAVENOUS
  Filled 2012-11-13: qty 1

## 2012-11-13 MED ORDER — FAMOTIDINE IN NACL 20-0.9 MG/50ML-% IV SOLN
20.0000 mg | Freq: Once | INTRAVENOUS | Status: AC
  Administered 2012-11-13: 20 mg via INTRAVENOUS
  Filled 2012-11-13: qty 50

## 2012-11-13 MED ORDER — SODIUM CHLORIDE 0.9 % IV SOLN
INTRAVENOUS | Status: DC
  Administered 2012-11-13: 1000 mL via INTRAVENOUS

## 2012-11-13 MED ORDER — OXYCODONE-ACETAMINOPHEN 5-325 MG PO TABS
2.0000 | ORAL_TABLET | Freq: Once | ORAL | Status: AC
  Administered 2012-11-13: 2 via ORAL
  Filled 2012-11-13: qty 2

## 2012-11-13 MED ORDER — IOHEXOL 300 MG/ML  SOLN
50.0000 mL | Freq: Once | INTRAMUSCULAR | Status: AC | PRN
  Administered 2012-11-13: 50 mL via INTRAVENOUS

## 2012-11-13 NOTE — ED Notes (Signed)
Pt. Stated, I started having SOB, tingling all over, my jaw hurts all over, I was sitting in my shop and I started having the numbness all over with SOB

## 2012-11-13 NOTE — ED Notes (Signed)
Patient transported to CT 

## 2012-11-13 NOTE — ED Provider Notes (Signed)
CSN: [id number]     Arrival date & time 11/13/12  1253 History     First MD Initiated Contact with Patient 11/13/12 1259     Chief Complaint  Patient presents with  . Shortness of Breath    tingling all over  . Weakness   (Consider location/radiation/quality/duration/timing/severity/associated sxs/prior Treatment) Patient is a 79 y.o. male presenting with shortness of breath and weakness. The history is provided by the patient.  Shortness of Breath Weakness Associated symptoms include shortness of breath.   issue here after having an acute onset of left-sided neck pain and feeling a pop when he was sitting down at rest. Does have a history of a carotid artery aneurysm. Notes mild headache without vomiting or visual changes. Notes whole-body weakness as well 2. Patient also has experienced constant midsternal chest discomfort without associated without dyspnea or diaphoresis. Symptoms have been constant and nothing makes them better worse denies any recent fever or cough. No treatment used prior to arrival.  Past Medical History  Diagnosis Date  . Hyperlipidemia   . Hypertension   . Esophageal reflux   . Diverticulosis   . Rheumatic fever ~ 1944  . Vision loss of left eye     S/P "cataract OR"; "can see a little; not much"  . Kidney stones   . Hard of hearing, left   . Pulmonary embolism     On Xarelto  . Hypercalcemia   . Arthritis     "knees"  . CAD (coronary artery disease)     nonobstructive by cath 07/22/11 with small to moderate sized diagonal branch with ostial stenosis , medical therapy advised  . Prostate cancer     s/p cryotherapy  . AV block     s/p PPM  . COPD (chronic obstructive pulmonary disease)   . Carotid artery aneurysm     Bilateral internal carotid artery aneurysms followed by Dr. Arbie Cookey  . Shortness of breath     with ambulation  . Constipation    Past Surgical History  Procedure Laterality Date  . Cystoscopy    . Lung surgery  1957    "born  w/bleb in [place]  . Insert / replace / remove pacemaker  1991    initial placement  . Insert / replace / remove pacemaker  ~ 2011    "changed out"  . Tonsillectomy and adenoidectomy  1943  . Appendectomy  2000's  . Cholecystectomy  2000's  . Cataract extraction w/ intraocular lens implant  ~ 2008    left eye  . Lithotripsy  ~ 2011    "twice"  . Cardiac catheterization    . Prostate cryoablation  ~ 2008  . Video bronchoscopy  10/20/2011    Procedure: VIDEO BRONCHOSCOPY;  Surgeon: [name], MD;  Location: [facility] OR;  Service: Thoracic;  Laterality: N/A;   Family History  Problem Relation Age of Onset  . Hypertension Neg Hx   . Hyperlipidemia Neg Hx   . Heart failure Neg Hx   . Heart disease Neg Hx   . Colon cancer Neg Hx   . Breast cancer Mother   . Emphysema Sister     was a smoker   History  Substance Use Topics  . Smoking status: Former Smoker -- 1.00 packs/day for 50 years    Types: Cigarettes    Quit date: 03/23/2004  . Smokeless tobacco: Current User    Types: Chew  . Alcohol Use: No    Review of Systems  Respiratory:  Positive for shortness of breath.   Neurological: Positive for weakness.  All other systems reviewed and are negative.    Allergies  Iohexol and Penicillins  Home Medications   Current Outpatient Rx  Name  Route  Sig  Dispense  Refill  . EXPIRED: albuterol (PROVENTIL,VENTOLIN) 90 MCG/ACT inhaler   Inhalation   Inhale 2 puffs into the lungs every 6 (six) hours as needed. For wheezing or shortness of breath         . DULoxetine (CYMBALTA) 60 MG capsule   Oral   Take 60 mg by mouth daily.         Marland Kitchen ibuprofen (ADVIL) 200 MG tablet   Oral   Take 200 mg by mouth as needed for pain.         Marland Kitchen losartan (COZAAR) 50 MG tablet   Oral   Take 50 mg by mouth daily.         . Multiple Vitamin (MULTIVITAMIN) tablet   Oral   Take 1 tablet by mouth daily.         . nitroGLYCERIN (NITROSTAT) 0.4 MG SL tablet   Sublingual   Place 1  tablet (0.4 mg total) under the tongue every 5 (five) minutes as needed. For chest pain   30 tablet   3   . Rivaroxaban (XARELTO) 20 MG TABS   Oral   Take 1 tablet (20 mg total) by mouth daily.   30 tablet   4   . traZODone (DESYREL) 150 MG tablet   Oral   Take 150 mg by mouth as directed. 1/2 -1 tablet by mouth at bedtime          BP 174/85  Pulse 109  Temp(Src) 98 F (36.7 C) (Oral)  Resp 24  SpO2 99% Physical Exam  Nursing note and vitals reviewed. Constitutional: He is oriented to person, place, and time. He appears well-developed and well-nourished.  Non-toxic appearance. No distress.  HENT:  Head: Normocephalic and atraumatic.  Eyes: Conjunctivae, EOM and lids are normal. Pupils are equal, round, and reactive to light.  Neck: Normal range of motion. Neck supple. No tracheal deviation present. No mass present.  Cardiovascular: Normal rate, regular rhythm and normal heart sounds.  Exam reveals no gallop.   No murmur heard. Pulmonary/Chest: Effort normal and breath sounds normal. No stridor. No respiratory distress. He has no decreased breath sounds. He has no wheezes. He has no rhonchi. He has no rales.  Abdominal: Soft. Normal appearance and bowel sounds are normal. He exhibits no distension. There is no tenderness. There is no rebound and no CVA tenderness.  Musculoskeletal: Normal range of motion. He exhibits no edema and no tenderness.  Neurological: He is alert and oriented to person, place, and time. He has normal strength. No cranial nerve deficit or sensory deficit. GCS eye subscore is 4. GCS verbal subscore is 5. GCS motor subscore is 6.  Skin: Skin is warm and dry. No abrasion and no rash noted.  Psychiatric: He has a normal mood and affect. His speech is normal and behavior is normal.    ED Course   Procedures (including critical care time)  Labs Reviewed  CBC WITH DIFFERENTIAL  BASIC METABOLIC PANEL   No results found. No diagnosis found.  MDM    Date: 11/13/2012  Rate: 108  Rhythm: sinus tachycardia  QRS Axis: normal  Intervals: normal  ST/T Wave abnormalities: nonspecific ST changes  Conduction Disutrbances:none  Narrative Interpretation:   Old EKG Reviewed: none  available   7:56 PM Patient states that he had sudden onset of worsening headache that has been persistent for 2 months but became worse today. Pt felt a pop there was concern. His work appears been negative including a CT angiogram of his head and neck. He had a CT of his chest for possible recurrent lung cancer which does show a nodule concerning for cancer. Does medicate for his headache he does flow better. His neurological exam stable. He is to followup with his thoracic surgeon for further evaluation and management of this new lung nodule with possible recurrent cancer  Toy Baker, MD 11/13/12 [phone]

## 2012-11-13 NOTE — ED Notes (Signed)
Pt returned from radiology. Placed back on monitor. Denies pain. sts he is getting hungry.

## 2012-11-17 ENCOUNTER — Telehealth: Payer: Self-pay | Admitting: Internal Medicine

## 2012-11-17 NOTE — Telephone Encounter (Signed)
Discussed with [name] and he recommends follow up with PCP.  I have called patient and discussed with his wife and the patient.  His wife says he was better until the Cymbalta was stopped by Dr Jeannetta Nap a month or two ago.  I have asked them to call him to set up an appointment.  They did not like this and hung up the phone

## 2012-11-17 NOTE — Telephone Encounter (Signed)
Pt taking xarelto, having severe headache and nausea , pls advise pt  [phone]

## 2012-11-24 ENCOUNTER — Other Ambulatory Visit: Payer: Medicare Other

## 2012-11-24 ENCOUNTER — Other Ambulatory Visit: Payer: Self-pay

## 2012-11-24 ENCOUNTER — Encounter: Payer: Self-pay | Admitting: Cardiothoracic Surgery

## 2012-11-24 ENCOUNTER — Ambulatory Visit (INDEPENDENT_AMBULATORY_CARE_PROVIDER_SITE_OTHER): Payer: Medicare Other | Admitting: Cardiothoracic Surgery

## 2012-11-24 VITALS — BP 134/90 | HR 127 | Resp 20 | Ht 71.0 in | Wt 164.0 lb

## 2012-11-24 DIAGNOSIS — C3490 Malignant neoplasm of unspecified part of unspecified bronchus or lung: Secondary | ICD-10-CM

## 2012-11-24 DIAGNOSIS — R911 Solitary pulmonary nodule: Secondary | ICD-10-CM

## 2012-11-24 DIAGNOSIS — Z09 Encounter for follow-up examination after completed treatment for conditions other than malignant neoplasm: Secondary | ICD-10-CM

## 2012-11-24 NOTE — Progress Notes (Signed)
[address]       [place] [address]             [phone]                             [name] [facility] Medical Record #[id number] Date of Birth: Mar 08, 1933  [name], * [name], MD  Chief Complaint:   PostOp Follow Up Visit 10/20/2011  DATE OF DISCHARGE:  OPERATIVE REPORT  Preop: Non small cell Lung cancer- left  Postop: same  SURGICAL PROCEDURE: Repeat left Mini thoracotomy, wedge resection, and  segmentectomy of the superior segment of the left lower lobe, and  placement of On-Q device.    pT1b, pNX, MX. INVASIVE MODERATELY DIFFERENTIATED ADENOCARCINOMA, SPANNING 2.2 CM IN GREATEST DIMENSION.   Wedge resection 10/20/2011   History of Present Illness:     Patient returns to the office today for regularly scheduled followup after surgical wedge resection of a T1 [name]. moderately differentiated adenocarcinoma the lung 2 cm in size located in the superior segment of the left lower lobe 10/20/2011. The patient previously had full left thoracotomy many years ago for "cyst". Followup CT scan in April 2014 showed a new left upper lobe lesion. The patient was to return today with a followup super D. CT scan.  For several weeks he has noted increasing nausea itching and rash. He had been on Xaralto for anticoagulation related to intermittent atrial fibrillation. This was recently stopped to see if the nausea itching and rash was related to medication. He has been started on Coumadin. He had an ER visit 10 days ago at that time a CT of the chest and brain was performed. A repeat scan was not done today as previously ordered.   History  Smoking status  . Former Smoker -- 1.00 packs/day for 50 years  . Types: Cigarettes  . Quit date: 03/23/2004  Smokeless tobacco  . Current User  . Types: Chew       Allergies  Allergen Reactions  . Iohexol      Desc: PT STATED TODAY HE GETS SLIGHT SOB FROM IV CONTRAST. HE NEEDS  FULL PREMEDS FROM NOW ON PER DR. MATTERN   . Penicillins Itching and Rash    "haven't took none in 40 years"    Current Outpatient Prescriptions  Medication Sig Dispense Refill  . ibuprofen (ADVIL) 200 MG tablet Take 200 mg by mouth as needed for pain.      Marland Kitchen losartan (COZAAR) 50 MG tablet Take 50 mg by mouth daily.      . nitroGLYCERIN (NITROSTAT) 0.4 MG SL tablet Place 1 tablet (0.4 mg total) under the tongue every 5 (five) minutes as needed. For chest pain  30 tablet  3  . Rivaroxaban (XARELTO) 20 MG TABS Take 1 tablet (20 mg total) by mouth daily.  30 tablet  4  . traZODone (DESYREL) 150 MG tablet Take 150 mg by mouth as directed. 1/2 -1 tablet by mouth at bedtime      . albuterol (PROVENTIL,VENTOLIN) 90 MCG/ACT inhaler Inhale 2 puffs into the lungs every 6 (six) hours as needed. For wheezing or shortness of breath       No current facility-administered medications for this visit.       Physical Exam: BP 134/90  Pulse 127  Resp 20  Ht 5\' 11"  (1.803 m)  Wt 164 lb (74.39 kg)  BMI 22.88 kg/m2  SpO2 97% Patient in sinus rhythm on monitor today General appearance: alert and cooperative Neurologic: intact Heart: regular rate and rhythm, S1, S2 normal, no murmur, click, rub or gallop and normal apical impulse Lungs: clear to auscultation bilaterally and normal percussion bilaterally Abdomen: soft, non-tender; bowel sounds normal; no masses,  no organomegaly Extremities: extremities normal, atraumatic, no cyanosis or edema and Homans sign is negative, no sign of DVT Wounds:well healed.  No adenopathy  Diagnostic Studies & Laboratory data:         Recent Radiology Findings: Ct Angio Head W/cm &/or Wo Cm  11/13/2012   *RADIOLOGY REPORT*  Clinical Data:  Numbness all over.  History of bilateral ICA pseudoaneurysms.  Headache.  THE PATIENT WAS PREMEDICATED WITH IV STEROIDS BECAUSE OF THE HISTORY OF CONTRAST ALLERGY.  THERE WERE NO ADVERSE EVENTS.  CT ANGIOGRAPHY HEAD AND NECK   Technique:  Multidetector CT imaging of the head and neck was performed using the standard protocol during bolus administration of intravenous contrast.  Multiplanar CT image reconstructions including MIPs were obtained to evaluate the vascular anatomy. Carotid stenosis measurements (when applicable) are obtained utilizing NASCET criteria, using the distal internal carotid diameter as the denominator.  Contrast: 50mL OMNIPAQUE IOHEXOL 350 MG/ML SOLN  Comparison:  08/25/2011 CT angio neck.  10/03/2008 CT angio head.  CTA NECK  Findings:  Right chest cardiac pacemaker.  COPD.  Spiculated left upper lobe lesion is new from priors measuring 11 x 15 mm.  In the setting of chronic of tobacco abuse, concern raised for pulmonary neoplasm.  Previously identified and biopsied, spiculated lesion superior segment left lower lobe lesion, not well seen on today's examination.  Stable subcentimeter thyroid nodules.  Negative larynx.  Advanced spondylosis.  Three-vessel arch configuration without proximal stenosis.  Unremarkable right common carotid artery.  Mild nonstenotic posterior wall plaque.  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  No stenosis.  No significant left or right vertebral artery pathology.  Left vertebral dominant.  Unremarkable left common carotid artery.  Posterior wall plaque is non stenotic in the proximal left internal carotid artery. Rim calcified predominately thrombosed 7 x 7 x 8 mm more inferior cervical ICA aneurysm projecting medially is unchanged.  More superiorly, a  partially thrombosed and calcified skull base cervical ICA aneurysm measuring 8 x 10 x 11 mm is also stable. Lateral thrombus extends cephalad to the level of the horizontal petrous/foramen lacerum level.  No ICA dissection.   Review of the MIP images confirms the above findings.  IMPRESSION:  Stable extracranial  atherosclerotic change and stable bilateral cervical ICA pseudoaneurysms.  No evidence for enlargement, the dissection, or neck hemorrhage.  New spiculated left upper lobe 11 x 15 mm lesion; in the setting of COPD cannot exclude squamous cell carcinoma. As the patient has been premedicated, CT chest will be performed shortly with an additional 50 ml of contrast for expeditious  evaluation of the chest and mediastinum given his contrast allergy. Findings discussed with EDP.  CTA HEAD  Findings:  There is no evidence for acute infarction, intracranial hemorrhage, mass lesion, hydrocephalus, or extra-axial fluid.  Mild atrophy.  Mild white matter disease.  Post infusion, no abnormal enhancement.  Intact calvarium.  The  intracranial vasculature is dolichoectatic but patent.  No intracranial dissection or berry aneurysm is present.  The left vertebral is dominant.  There  is no proximal vascular occlusion or significant flow reducing lesion. Similar appearance to priors.   Review of the MIP images confirms the above findings.  IMPRESSION: Unremarkable CTA head.   Original Report Authenticated By: Davonna Belling, M.D.   Ct Angio Neck W/cm &/or Wo/cm  11/13/2012   *RADIOLOGY REPORT*  Clinical Data:  Numbness all over.  History of bilateral ICA pseudoaneurysms.  Headache.  THE PATIENT WAS PREMEDICATED WITH IV STEROIDS BECAUSE OF THE HISTORY OF CONTRAST ALLERGY.  THERE WERE NO ADVERSE EVENTS.  CT ANGIOGRAPHY HEAD AND NECK  Technique:  Multidetector CT imaging of the head and neck was performed using the standard protocol during bolus administration of intravenous contrast.  Multiplanar CT image reconstructions including MIPs were obtained to evaluate the vascular anatomy. Carotid stenosis measurements (when applicable) are obtained utilizing NASCET criteria, using the distal internal carotid diameter as the denominator.  Contrast: 50mL OMNIPAQUE IOHEXOL 350 MG/ML SOLN  Comparison:  08/25/2011 CT angio neck.  10/03/2008 CT  angio head.  CTA NECK  Findings:  Right chest cardiac pacemaker.  COPD.  Spiculated left upper lobe lesion is new from priors measuring 11 x 15 mm.  In the setting of chronic of tobacco abuse, concern raised for pulmonary neoplasm.  Previously identified and biopsied, spiculated lesion superior segment left lower lobe lesion, not well seen on today's examination.  Stable subcentimeter thyroid nodules.  Negative larynx.  Advanced spondylosis.  Three-vessel arch configuration without proximal stenosis.  Unremarkable right common carotid artery.  Mild nonstenotic posterior wall plaque.  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  No stenosis.  No significant left or right vertebral artery pathology.  Left vertebral dominant.  Unremarkable left common carotid artery.  Posterior wall plaque is non stenotic in the proximal left internal carotid artery. Rim calcified predominately thrombosed 7 x 7 x 8 mm more inferior cervical ICA aneurysm projecting medially is unchanged.  More superiorly, a  partially thrombosed and calcified skull base cervical ICA aneurysm measuring 8 x 10 x 11 mm is also stable. Lateral thrombus extends cephalad to the level of the horizontal petrous/foramen lacerum level.  No ICA dissection.   Review of the MIP images confirms the above findings.  IMPRESSION:  Stable extracranial atherosclerotic change and stable bilateral cervical ICA pseudoaneurysms.  No evidence for enlargement, the dissection, or neck hemorrhage.  New spiculated left upper lobe 11 x 15 mm lesion; in the setting of COPD cannot exclude squamous cell carcinoma. As the patient has been premedicated, CT chest will be performed shortly with an additional 50 ml of contrast for expeditious  evaluation of the chest and mediastinum given his contrast allergy. Findings discussed with EDP.  CTA HEAD  Findings:  There is  no evidence for acute infarction, intracranial hemorrhage, mass lesion, hydrocephalus, or extra-axial fluid.  Mild atrophy.  Mild white matter disease.  Post infusion, no abnormal enhancement.  Intact calvarium.  The  intracranial vasculature is dolichoectatic but patent.  No intracranial dissection or berry aneurysm is present.  The left vertebral is dominant.  There is no proximal vascular occlusion or significant flow reducing lesion. Similar appearance to priors.   Review of the MIP images confirms the above findings.  IMPRESSION: Unremarkable CTA head.   Original Report Authenticated By: Davonna Belling, M.D.   Ct Chest W Contrast  11/22/2012   *RADIOLOGY REPORT*  Clinical Data: Shortness of breath,  weakness, abnormality on recently performed neck CTA.  CT CHEST WITH CONTRAST  Technique:  Multidetector CT imaging of the chest was performed following the standard protocol during bolus administration of intravenous contrast.  Contrast: 50mL OMNIPAQUE IOHEXOL 300 MG/ML  SOLN  Comparison: Neck CT head - earlier same day; chest CT - 07/14/2012; 09/03/2011  Findings:  Stable postsurgical change of the superior segment of the left lower lobe.  While the external diameter of the previous identified approximately 1.3 x 1.0 cm nodule within the left lung apex (image 13, series 3) is grossly unchanged, the nodule appears more consolidated on the present examination, with slight differences possibly attributable to slice selection.  Additional approximately 8 mm ground-glass nodule within the right lower lobe (image 43, series 3) appears grossly unchanged, as does an approximately 5 mm ground-glass nodule within the superior segment of the lingula (image 34, series 3).  No new discrete pulmonary nodules.  Scattered shoddy mediastinal lymph nodes appear grossly unchanged and are individually not enlarged by size criteria with index pretracheal node measuring 9 mm in short axis diameter (image 23, series 2). No definite hilar  or axillary lymphadenopathy.  Advanced centrilobular emphysematous change.  Minimal dependent subsegmental atelectasis.  No focal airspace opacity.  No pleural effusion or pneumothorax.  The central pulmonary airways are patent.  Normal heart size.  Right anterior chest wall dual lead pacemaker. No pericardial effusion.  Normal caliber of the main pulmonary artery. Grossly unchanged mild ectasia of the ascending thoracic aorta measuring approximately 4.1 cm in greatest oblique short axis diameter (image 30, series 2).  Conventional configuration of the aortic arch.  Scattered minimal atherosclerotic plaque within the thoracic aorta.  No definite thoracic aortic dissection or periaortic stranding.  Limited evaluation of the upper abdomen demonstrates a partially exophytic approximately 3.0 cm cyst arising from the posterior superior aspect of the right kidney.  Several additional smaller renal cysts are noted bilaterally.  Post cholecystectomy.  No acute or aggressive osseous abnormalities.  IMPRESSION:  1.  No change to minimal increase in the indeterminate approximately 1.3 cm nodule within the left lung apex, with slight differences in possibly attributable to slice selection.  While possibly representing of scar, given advanced emphysematous change, a slowly growing lung cancer is not excluded.  As such, further evaluation with a follow-up chest CT in 3 months is recommended.  2.  Grossly unchanged bilateral sub centimeter ground-glass nodules, likely too small to accurately characterize.  Continued attention on follow-up is recommended.  3.  Stable postsurgical change of the superior segment of the left lower lobe.  4.  Grossly unchanged mild fusiform ectasia of the descending thoracic aorta measuring approximately 41 mm in diameter.  Above findings discussed with Dr. Freida Busman at the time of examination completion.   Original Report Authenticated By: Tacey Ruiz, MD   Ct Chest Wo Contrast  07/14/2012   *RADIOLOGY REPORT*  Clinical Data: Follow-up lung cancer.  CT CHEST WITHOUT CONTRAST  Technique:  Multidetector CT imaging of the chest was performed following the standard protocol without IV contrast.  Comparison: CT scan 08/18/2011.  Findings: The chest wall is stable.  A permanent right-sided pacemaker is noted.  No supraclavicular or axillary lymphadenopathy.  The bony thorax is intact.  No destructive bone lesions or spinal canal compromise.  Moderate degenerative changes involving the spine.  The heart is normal in size.  No pericardial effusion.  No mediastinal or hilar lymphadenopathy.  Stable tortuosity and ectasia of the thoracic aorta.  The esophagus is grossly normal.  Examination of the lung parenchyma demonstrates stable advanced emphysematous changes and pulmonary interstitial scarring.  The left upper lobe pulmonary nodule has been excised.  No findings for recurrent or residual tumor.  There is a new left apical lung density.  This could reflect progressive scarring change but does need observation.  There is a 7 mm nodule at the right lung base.  This is difficult to see on the prior CT but was a on the prior PET CT and needs continued observation.  There is an ill-defined nodular density measuring approximate 5 mm in the superior segment of the left lower lobe on image number 31. I believe this was present on the prior CT scan although it is more obvious now.  A 4 mm nodule is noted on image number 22 in the left upper lobe. This was present on the prior study and has not significantly changed.  No acute pulmonary findings or pleural effusion.  The upper abdomen is unremarkable.  Stable right renal cyst.  IMPRESSION:  1.  Surgical changes from prior excision of a left apical lung neoplasm. 2.  Advanced emphysematous changes and pulmonary scarring. 3.  New 13 x 8 mm left apical lung density.  This could reflect scarring change but needs close observation. 4.  Other small pulmonary nodules as  discussed above.  Recommend continued observation. 5.  No acute pulmonary findings and no mediastinal or hilar lymphadenopathy. 6.  Stable tortuosity and ectasia of the thoracic aorta.   Original Report Authenticated By: Rudie Meyer, M.D.       Recent Labs: Lab Results  Component Value Date   WBC 8.4 11/13/2012   HGB 15.3 11/13/2012   HCT 42.9 11/13/2012   PLT 230 11/13/2012   GLUCOSE 95 11/13/2012   CHOL 192 07/22/2011   TRIG 76 07/22/2011   HDL 57 07/22/2011   LDLCALC 161* 07/22/2011   ALT 11 10/22/2011   AST 19 10/22/2011   NA 138 11/13/2012   K 3.6 11/13/2012   CL 103 11/13/2012   CREATININE 0.99 11/13/2012   BUN 13 11/13/2012   CO2 25 11/13/2012   TSH 4.001 06/13/2011   INR 0.97 10/16/2011   HGBA1C 5.0 06/13/2011      Assessment / Plan:     wedge resection of Stage I INVASIVE MODERATELY DIFFERENTIATED ADENOCARCINOMA, SPANNING 2.2 CM IN GREATEST DIMENSION superior segment left lower lobe  Followup CT scan is reviewed with the patient, patient has diffuse scarring throughout the lungs, but of concern is a slightly more prominent area of scarring at the left apex. This could be another primary lung cancer versus progressive scarring I discussed this with the patient.   Although the left upper lobe lesion has not significantly changed in size over 4 months it is more prominent.  I recommended to the patient that we proceed with PET scan, followed by needle biopsy of left upper lobe lesion. The patient's 2 previous thoracotomies on the left and limited pulmonary reserve may make surgical resection of recurrent tumor impossible, he could considered for stereotactic radiotherapy.  The patient is now on Coumadin for intermittent atrial flutter relation, this will need to be held prior to scheduled needle biopsy.  I'll plan to see the patient back in the office after the PET scan and needle biopsy has been performed.     [name] 11/24/2012 12:36 PM

## 2012-12-01 ENCOUNTER — Encounter (HOSPITAL_COMMUNITY)
Admission: RE | Admit: 2012-12-01 | Discharge: 2012-12-01 | Disposition: A | Discharge status: Home / Self Care | Payer: Medicare Other | Source: Ambulatory Visit | Attending: Cardiothoracic Surgery | Admitting: Cardiothoracic Surgery

## 2012-12-01 ENCOUNTER — Encounter (HOSPITAL_COMMUNITY): Payer: Self-pay

## 2012-12-01 DIAGNOSIS — C3490 Malignant neoplasm of unspecified part of unspecified bronchus or lung: Secondary | ICD-10-CM | POA: Insufficient documentation

## 2012-12-01 LAB — GLUCOSE, CAPILLARY: Glucose-Capillary: 110 mg/dL — ABNORMAL HIGH (ref 70–99)

## 2012-12-01 MED ORDER — FLUDEOXYGLUCOSE F - 18 (FDG) INJECTION
19.4000 | Freq: Once | INTRAVENOUS | Status: AC | PRN
  Administered 2012-12-01: 19.4 via INTRAVENOUS

## 2012-12-02 ENCOUNTER — Other Ambulatory Visit: Payer: Self-pay | Admitting: Radiology

## 2012-12-06 ENCOUNTER — Encounter (HOSPITAL_COMMUNITY)
Admission: RE | Admit: 2012-12-06 | Discharge: 2012-12-06 | Disposition: A | Discharge status: Home / Self Care | Payer: Medicare Other | Source: Ambulatory Visit | Attending: Interventional Radiology | Admitting: Interventional Radiology

## 2012-12-06 ENCOUNTER — Encounter (HOSPITAL_COMMUNITY): Payer: Self-pay

## 2012-12-06 ENCOUNTER — Ambulatory Visit (HOSPITAL_COMMUNITY)
Admission: RE | Admit: 2012-12-06 | Discharge: 2012-12-06 | Disposition: A | Discharge status: Home / Self Care | Payer: Medicare Other | Source: Ambulatory Visit | Attending: Cardiothoracic Surgery | Admitting: Cardiothoracic Surgery

## 2012-12-06 VITALS — BP 130/64 | HR 58 | Temp 98.0°F | Resp 18 | Ht 71.0 in | Wt 164.0 lb

## 2012-12-06 DIAGNOSIS — Z8546 Personal history of malignant neoplasm of prostate: Secondary | ICD-10-CM | POA: Insufficient documentation

## 2012-12-06 DIAGNOSIS — C3490 Malignant neoplasm of unspecified part of unspecified bronchus or lung: Secondary | ICD-10-CM

## 2012-12-06 DIAGNOSIS — M1710 Unilateral primary osteoarthritis, unspecified knee: Secondary | ICD-10-CM | POA: Insufficient documentation

## 2012-12-06 DIAGNOSIS — Z950 Presence of cardiac pacemaker: Secondary | ICD-10-CM | POA: Insufficient documentation

## 2012-12-06 DIAGNOSIS — I2510 Atherosclerotic heart disease of native coronary artery without angina pectoris: Secondary | ICD-10-CM | POA: Insufficient documentation

## 2012-12-06 DIAGNOSIS — J4489 Other specified chronic obstructive pulmonary disease: Secondary | ICD-10-CM | POA: Insufficient documentation

## 2012-12-06 DIAGNOSIS — K5730 Diverticulosis of large intestine without perforation or abscess without bleeding: Secondary | ICD-10-CM | POA: Insufficient documentation

## 2012-12-06 DIAGNOSIS — E785 Hyperlipidemia, unspecified: Secondary | ICD-10-CM | POA: Insufficient documentation

## 2012-12-06 DIAGNOSIS — I720 Aneurysm of carotid artery: Secondary | ICD-10-CM | POA: Insufficient documentation

## 2012-12-06 DIAGNOSIS — I10 Essential (primary) hypertension: Secondary | ICD-10-CM | POA: Insufficient documentation

## 2012-12-06 DIAGNOSIS — Z87442 Personal history of urinary calculi: Secondary | ICD-10-CM | POA: Insufficient documentation

## 2012-12-06 DIAGNOSIS — C3410 Malignant neoplasm of upper lobe, unspecified bronchus or lung: Secondary | ICD-10-CM | POA: Insufficient documentation

## 2012-12-06 DIAGNOSIS — H5460 Unqualified visual loss, one eye, unspecified: Secondary | ICD-10-CM | POA: Insufficient documentation

## 2012-12-06 DIAGNOSIS — J449 Chronic obstructive pulmonary disease, unspecified: Secondary | ICD-10-CM | POA: Insufficient documentation

## 2012-12-06 DIAGNOSIS — K219 Gastro-esophageal reflux disease without esophagitis: Secondary | ICD-10-CM | POA: Insufficient documentation

## 2012-12-06 DIAGNOSIS — H9190 Unspecified hearing loss, unspecified ear: Secondary | ICD-10-CM | POA: Insufficient documentation

## 2012-12-06 DIAGNOSIS — Z7901 Long term (current) use of anticoagulants: Secondary | ICD-10-CM | POA: Insufficient documentation

## 2012-12-06 DIAGNOSIS — Z01812 Encounter for preprocedural laboratory examination: Secondary | ICD-10-CM | POA: Insufficient documentation

## 2012-12-06 DIAGNOSIS — Z86711 Personal history of pulmonary embolism: Secondary | ICD-10-CM | POA: Insufficient documentation

## 2012-12-06 HISTORY — PX: OTHER SURGICAL HISTORY: SHX169

## 2012-12-06 LAB — CBC
MCHC: 36.6 g/dL — ABNORMAL HIGH (ref 30.0–36.0)
RDW: 12.5 % (ref 11.5–15.5)

## 2012-12-06 LAB — APTT: aPTT: 26 seconds (ref 24–37)

## 2012-12-06 LAB — PROTIME-INR
INR: 1.02 (ref 0.00–1.49)
Prothrombin Time: 13.2 seconds (ref 11.6–15.2)

## 2012-12-06 MED ORDER — FENTANYL CITRATE 0.05 MG/ML IJ SOLN
INTRAMUSCULAR | Status: AC
  Filled 2012-12-06: qty 4

## 2012-12-06 MED ORDER — FENTANYL CITRATE 0.05 MG/ML IJ SOLN
INTRAMUSCULAR | Status: DC | PRN
  Administered 2012-12-06 (×2): 25 ug via INTRAVENOUS

## 2012-12-06 MED ORDER — MIDAZOLAM HCL 2 MG/2ML IJ SOLN
INTRAMUSCULAR | Status: DC | PRN
  Administered 2012-12-06 (×2): 2 mg via INTRAVENOUS

## 2012-12-06 MED ORDER — SODIUM CHLORIDE 0.9 % IV SOLN
Freq: Once | INTRAVENOUS | Status: AC
  Administered 2012-12-06: 09:00:00 via INTRAVENOUS

## 2012-12-06 MED ORDER — MIDAZOLAM HCL 5 MG/ML IJ SOLN
INTRAMUSCULAR | Status: AC
  Filled 2012-12-06: qty 1

## 2012-12-06 MED ORDER — MIDAZOLAM HCL 2 MG/2ML IJ SOLN
INTRAMUSCULAR | Status: AC
  Filled 2012-12-06: qty 6

## 2012-12-06 MED ORDER — FENTANYL CITRATE 0.05 MG/ML IJ SOLN
INTRAMUSCULAR | Status: AC
  Filled 2012-12-06: qty 2

## 2012-12-06 MED ORDER — LIDOCAINE HCL 1 % IJ SOLN
INTRAMUSCULAR | Status: AC
  Filled 2012-12-06: qty 10

## 2012-12-06 NOTE — Discharge Instructions (Signed)
Needle Biopsy of Lung  Care After  A needle biopsy is a procedure to get a sample of cells from your body for testing. A needle biopsy may be used to take tissue or fluid samples from muscles, bones and organs, such as the liver or lungs.  The sample from your needle biopsy may help your doctor determine what is causing:   A mass or lump. A needle biopsy may reveal whether a mass or lump is a cyst, an infection, a benign tumor or cancer.   Infection. Tests from a needle biopsy can help doctors determine what germs are causing an infection so that the best medicines can be used for treatment.   Inflammation. Looking closely at a needle biopsy sample may reveal what is causing inflammation and what types of cells are involved.  Your caregiver has now completed a needle biopsy of the lung to help diagnose a medical condition or to rule out a disease or condition. A needle biopsy may also be used to assess the progress of a treatment.  AFTER THE PROCEDURE  Once your caregiver has collected enough cells or tissue for analysis, your needle biopsy procedure is complete. Your biopsy sample is sent to a laboratory to be tested. The results may be available in a day or two. More technical tests may require more time. Ask your caregiver how long you can expect to wait.   Your health care team may apply a bandage over the areas where the needle was inserted. You may be asked to apply pressure to the bandage for several minutes to ensure there is minimal bleeding.  In most cases, you can leave when your needle biopsy procedure is completed. Do not drive yourself home. Someone else should take you home. Whether you can leave right away or whether you will need to stay for observation depends on how you feel and the exam by your caregiver after the biopsy. In some cases your health care team may want to observe you for a few hours to ensure you do not have complications from your biopsy. If you received an IV sedative or  general anesthetic, you will be taken to a comfortable place to relax while the medication wears off.  Expect to take it easy for the rest of the day. Protect the area where you received the needle biopsy by keeping the bandage in place for as long as instructed. You may feel some mild pain or discomfort in the area, but this should stop in a day or two. Only take over-the-counter or prescription medicines for pain, discomfort, or fever as directed by your caregiver.  SEEK MEDICAL CARE IF:    You have pain at the biopsy site that worsens or is not helped by medicines.   You have swelling at the needle biopsy site.   You have drainage from the biopsy site.   You have new or unusual pain in your back or at the top of one or both shoulders.  SEEK IMMEDIATE MEDICAL CARE IF:    You have a fever.   You develop lightheadedness or fainting.   You have chest pain or shortness of breath.   You have bleeding that does not stop with pressure or a bandage.   You have weakness or numbness in your legs.  Document Released: 01/04/2007 Document Revised: 06/01/2011 Document Reviewed: 01/04/2007  [employer] Patient Information 2014 [employer]

## 2012-12-06 NOTE — Procedures (Signed)
LUL lung nodule Bx 20 g core times 3 No comp

## 2012-12-06 NOTE — H&P (Signed)
[name] is an 79 y.o. male.   Chief Complaint: Hx left lung wedge resection 10/20/2011 after dx Lung Ca Non small cell lung ca Recent follow up shows new left lung lesion; +PET Scheduled now for left lung mass biopsy HPI:  Shingles- Rt chest and back- (67 weeks old);off coumadin x 4 days; HLD; HTN; GERD; Rh fever hx; diverticulitis; renal stones; Hx PE; hypercalcemia; CAD; prostate ca; COP  Past Medical History  Diagnosis Date  . Hyperlipidemia   . Hypertension   . Esophageal reflux   . Diverticulosis   . Rheumatic fever ~ 1944  . Vision loss of left eye     S/P "cataract OR"; "can see a little; not much"  . Kidney stones   . Hard of hearing, left   . Pulmonary embolism     On Xarelto  . Hypercalcemia   . Arthritis     "knees"  . CAD (coronary artery disease)     nonobstructive by cath 07/22/11 with small to moderate sized diagonal branch with ostial stenosis , medical therapy advised  . Prostate cancer     s/p cryotherapy  . AV block     s/p PPM  . COPD (chronic obstructive pulmonary disease)   . Carotid artery aneurysm     Bilateral internal carotid artery aneurysms followed by Dr. Arbie Cookey  . Shortness of breath     with ambulation  . Constipation     Past Surgical History  Procedure Laterality Date  . Cystoscopy    . Lung surgery  1957    "born w/bleb in [place]  . Insert / replace / remove pacemaker  1991    initial placement  . Insert / replace / remove pacemaker  ~ 2011    "changed out"  . Tonsillectomy and adenoidectomy  1943  . Appendectomy  2000's  . Cholecystectomy  2000's  . Cataract extraction w/ intraocular lens implant  ~ 2008    left eye  . Lithotripsy  ~ 2011    "twice"  . Cardiac catheterization    . Prostate cryoablation  ~ 2008  . Video bronchoscopy  10/20/2011    Procedure: VIDEO BRONCHOSCOPY;  Surgeon: [name], MD;  Location: [facility] OR;  Service: Thoracic;  Laterality: N/A;    Family History  Problem Relation Age of Onset  .  Hypertension Neg Hx   . Hyperlipidemia Neg Hx   . Heart failure Neg Hx   . Heart disease Neg Hx   . Colon cancer Neg Hx   . Breast cancer Mother   . Emphysema Sister     was a smoker   Social History:  reports that he quit smoking about 8 years ago. His smoking use included Cigarettes. He has a 50 pack-year smoking history. His smokeless tobacco use includes Chew. He reports that he does not drink alcohol or use illicit drugs.  Allergies:  Allergies  Allergen Reactions  . Iohexol      Desc: PT STATED TODAY HE GETS SLIGHT SOB FROM IV CONTRAST. HE NEEDS FULL PREMEDS FROM NOW ON PER DR. MATTERN   . Penicillins Itching and Rash    "haven't took none in 40 years"     (Not in a hospital admission)  No results found for this or any previous visit (from the past 48 hour(s)). No results found.  Review of Systems  Constitutional: Negative for fever.  Respiratory: Negative for cough.   Cardiovascular: Negative for chest pain.  Gastrointestinal: Negative for nausea  and vomiting.  Neurological: Positive for weakness. Negative for headaches.    Blood pressure 149/80, pulse 60, temperature 97.5 F (36.4 C), temperature source Oral, resp. rate 18, height 5\' 11"  (1.803 m), weight 164 lb (74.39 kg), SpO2 100.00%. Physical Exam  Constitutional: He is oriented to person, place, and time. He appears well-developed.  Cardiovascular: Normal rate, regular rhythm and normal heart sounds.   No murmur heard. Respiratory: Effort normal and breath sounds normal. He has no wheezes.  GI: Soft. Bowel sounds are normal. There is no tenderness.  Musculoskeletal: Normal range of motion.  Neurological: He is alert and oriented to person, place, and time.  Skin: Skin is warm and dry. Rash noted.  Dried shingles on Rt anterior and posterior back (approx 27 weeks old- on Prednisone)  Psychiatric: He has a normal mood and affect. His behavior is normal. Judgment and thought content normal.      Assessment/Plan Hx NSC lung ca; left lung wedge resection 09/2011 New left lung lesion +PET Scheduled for left lung mass biopsy Pt and wife aware of procedure benefits and risks and agreeable to proceed Consent signed and in chart OFF coumadin x 4 days  [name] 12/06/2012, 8:58 AM

## 2012-12-06 NOTE — Progress Notes (Signed)
No Pneumo. On CXR; pt ate 1/2 lunch tolerating fluids well, anticipating D/C 2 1400; puncture site unremarkable

## 2012-12-08 ENCOUNTER — Encounter: Payer: Self-pay | Admitting: Cardiothoracic Surgery

## 2012-12-08 ENCOUNTER — Ambulatory Visit (INDEPENDENT_AMBULATORY_CARE_PROVIDER_SITE_OTHER): Payer: Medicare Other | Admitting: Cardiothoracic Surgery

## 2012-12-08 VITALS — BP 150/90 | HR 114 | Resp 20 | Ht 71.0 in | Wt 164.0 lb

## 2012-12-08 DIAGNOSIS — C3490 Malignant neoplasm of unspecified part of unspecified bronchus or lung: Secondary | ICD-10-CM

## 2012-12-08 DIAGNOSIS — Z9889 Other specified postprocedural states: Secondary | ICD-10-CM

## 2012-12-08 DIAGNOSIS — C3492 Malignant neoplasm of unspecified part of left bronchus or lung: Secondary | ICD-10-CM

## 2012-12-08 NOTE — Progress Notes (Signed)
Thoracic Location of Tumor / Histology:Left upper lobe adenocarcinoma of lung.Initial diagnosis on 10/20/11 with new spot found 12/06/12  Patient presented with new spot found on lungs found during routine every 6 month on PET scan on 12/01/12.states he was asymptomatic of cough, or hemoptysis.States he has general shortness of breath (COPD)   Biopsies of left upper lung revealed adenocarcinoma on 12/06/12.  Tobacco/Marijuana/Snuff/ETOH use: Quit in 2006 after 1 pk/day over 50 years.  Past/Anticipated interventions by cardiothoracic surgery, if any: Left mini thoracotomy,wedge resection and segmentectomyof left lower lobe 10/20/2011  Past/Anticipated interventions by medical oncology, if any:No  Signs/Symptoms  Weight changes, if any:  Loss 4lb in last 4weeks  Respiratory complaints, if HYQ:MVHQIONGE greater on exertion but does have at rest.  Hemoptysis, if any: No  Pain issues, if XBM:WUXL level 6 related to shingles to right anterior chest.  SAFETY ISSUES:  Prior radiation?No  Pacemaker/ICD? Yes dual lead  Possible current pregnancy? No  Is the patient on methotrexate? No  Current Complaints / other details:Patient here with wife concern with new onset blurry vision over the last 2 to 3 weeks (patient attributes to start of prednisone), has had intermittent headache over the last month.

## 2012-12-08 NOTE — Progress Notes (Signed)
[address]       [place] [address]             [phone]                             ROSTON GRUNEWALD [facility] Medical Record #[id number] Date of Birth: [birth year]  [name], * [name], MD  Chief Complaint:   PostOp Follow Up Visit 10/20/2011  DATE OF DISCHARGE:  OPERATIVE REPORT  Preop: Non small cell Lung cancer- left  Postop: same  SURGICAL PROCEDURE: Repeat left Mini thoracotomy, wedge resection, and  segmentectomy of the superior segment of the left lower lobe, and  placement of On-Q device.    pT1b, pNX, MX. INVASIVE MODERATELY DIFFERENTIATED ADENOCARCINOMA, SPANNING 2.2 CM IN GREATEST DIMENSION.   Wedge resection 10/20/2011   Needle BX of left upper lobe lesion: [id number]  Lung, needle/core biopsy(ies), LUL - POSITIVE FOR ADENOCARCINOMA.  History of Present Illness:     Patient returns to the office today for regularly scheduled followup after surgical wedge resection of a T1 B. moderately differentiated adenocarcinoma the lung 2 cm in size located in the superior segment of the left lower lobe 10/20/2011. The patient previously had full left thoracotomy many years ago for "cyst". Followup CT scan in April 2014 showed a new left upper lobe lesion. Patient returns today to discuss followup treatment of biopsy-proven left upper lobe adenocarcinoma.   For several weeks he has noted increasing nausea itching and rash. He had been on Xaralto for anticoagulation related to intermittent atrial fibrillation. This was recently stopped to see if the nausea itching and rash was related to medication. He has been started on Coumadin. He had an ER visit 10 days ago at that time a CT of the chest and brain was performed. A repeat scan was not done today as previously ordered.  He is now lungs course of prednisone because of development of shingles right chest.    History  Smoking status  . Former Smoker --  1.00 packs/day for 50 years  . Types: Cigarettes  . Quit date: 03/23/2004  Smokeless tobacco  . Current User  . Types: Chew       Allergies  Allergen Reactions  . Iohexol      Desc: PT STATED TODAY HE GETS SLIGHT SOB FROM IV CONTRAST. HE NEEDS FULL PREMEDS FROM NOW ON PER DR. MATTERN   . Penicillins Itching and Rash    "haven't took none in 40 years"    Current Outpatient Prescriptions  Medication Sig Dispense Refill  . ibuprofen (ADVIL) 200 MG tablet Take 200 mg by mouth as needed for pain.      Marland Kitchen losartan (COZAAR) 50 MG tablet Take 50 mg by mouth daily.      . nitroGLYCERIN (NITROSTAT) 0.4 MG SL tablet Place 1 tablet (0.4 mg total) under the tongue every 5 (five) minutes as needed. For chest pain  30 tablet  3  . predniSONE (DELTASONE) 10 MG tablet Take 10 mg by mouth daily.      . traZODone (DESYREL) 150 MG tablet Take 150 mg by mouth as directed. 1/2 -1 tablet by mouth at bedtime      . warfarin (COUMADIN) 5 MG tablet Take 5 mg by mouth daily.      Marland Kitchen albuterol (PROVENTIL,VENTOLIN) 90 MCG/ACT inhaler Inhale 2 puffs into the  lungs every 6 (six) hours as needed. For wheezing or shortness of breath       No current facility-administered medications for this visit.       Physical Exam: BP 150/90  Pulse 114  Resp 20  Ht 5\' 11"  (1.803 m)  Wt 164 lb (74.39 kg)  BMI 22.88 kg/m2  SpO2 98% Patient in sinus rhythm on monitor today General appearance: alert and cooperative Neurologic: intact Heart: regular rate and rhythm, S1, S2 normal, no murmur, click, rub or gallop and normal apical impulse Lungs: clear to auscultation bilaterally and normal percussion bilaterally Abdomen: soft, non-tender; bowel sounds normal; no masses,  no organomegaly Extremities: extremities normal, atraumatic, no cyanosis or edema and Homans sign is negative, no sign of DVT Wounds:well healed.  No adenopathy  Diagnostic Studies & Laboratory data:         Recent Radiology Findings: Ct Angio  Head W/cm &/or Wo Cm  11/13/2012   *RADIOLOGY REPORT*  Clinical Data:  Numbness all over.  History of bilateral ICA pseudoaneurysms.  Headache.  THE PATIENT WAS PREMEDICATED WITH IV STEROIDS BECAUSE OF THE HISTORY OF CONTRAST ALLERGY.  THERE WERE NO ADVERSE EVENTS.  CT ANGIOGRAPHY HEAD AND NECK  Technique:  Multidetector CT imaging of the head and neck was performed using the standard protocol during bolus administration of intravenous contrast.  Multiplanar CT image reconstructions including MIPs were obtained to evaluate the vascular anatomy. Carotid stenosis measurements (when applicable) are obtained utilizing NASCET criteria, using the distal internal carotid diameter as the denominator.  Contrast: 50mL OMNIPAQUE IOHEXOL 350 MG/ML SOLN  Comparison:  08/25/2011 CT angio neck.  10/03/2008 CT angio head.  CTA NECK  Findings:  Right chest cardiac pacemaker.  COPD.  Spiculated left upper lobe lesion is new from priors measuring 11 x 15 mm.  In the setting of chronic of tobacco abuse, concern raised for pulmonary neoplasm.  Previously identified and biopsied, spiculated lesion superior segment left lower lobe lesion, not well seen on today's examination.  Stable subcentimeter thyroid nodules.  Negative larynx.  Advanced spondylosis.  Three-vessel arch configuration without proximal stenosis.  Unremarkable right common carotid artery.  Mild nonstenotic posterior wall plaque.  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  No stenosis.  No significant left or right vertebral artery pathology.  Left vertebral dominant.  Unremarkable left common carotid artery.  Posterior wall plaque is non stenotic in the proximal left internal carotid artery. Rim calcified predominately thrombosed 7 x 7 x 8 mm more inferior cervical ICA aneurysm projecting medially is unchanged.  More superiorly, a  partially  thrombosed and calcified skull base cervical ICA aneurysm measuring 8 x 10 x 11 mm is also stable. Lateral thrombus extends cephalad to the level of the horizontal petrous/foramen lacerum level.  No ICA dissection.   Review of the MIP images confirms the above findings.  IMPRESSION:  Stable extracranial atherosclerotic change and stable bilateral cervical ICA pseudoaneurysms.  No evidence for enlargement, the dissection, or neck hemorrhage.  New spiculated left upper lobe 11 x 15 mm lesion; in the setting of COPD cannot exclude squamous cell carcinoma. As the patient has been premedicated, CT chest will be performed shortly with an additional 50 ml of contrast for expeditious  evaluation of the chest and mediastinum given his contrast allergy. Findings discussed with EDP.  CTA HEAD  Findings:  There is no  evidence for acute infarction, intracranial hemorrhage, mass lesion, hydrocephalus, or extra-axial fluid.  Mild atrophy.  Mild white matter disease.  Post infusion, no abnormal enhancement.  Intact calvarium.  The  intracranial vasculature is dolichoectatic but patent.  No intracranial dissection or berry aneurysm is present.  The left vertebral is dominant.  There is no proximal vascular occlusion or significant flow reducing lesion. Similar appearance to priors.   Review of the MIP images confirms the above findings.  IMPRESSION: Unremarkable CTA head.   Original Report Authenticated By: Davonna Belling, M.D.   Ct Angio Neck W/cm &/or Wo/cm  11/13/2012   *RADIOLOGY REPORT*  Clinical Data:  Numbness all over.  History of bilateral ICA pseudoaneurysms.  Headache.  THE PATIENT WAS PREMEDICATED WITH IV STEROIDS BECAUSE OF THE HISTORY OF CONTRAST ALLERGY.  THERE WERE NO ADVERSE EVENTS.  CT ANGIOGRAPHY HEAD AND NECK  Technique:  Multidetector CT imaging of the head and neck was performed using the standard protocol during bolus administration of intravenous contrast.  Multiplanar CT image reconstructions including MIPs  were obtained to evaluate the vascular anatomy. Carotid stenosis measurements (when applicable) are obtained utilizing NASCET criteria, using the distal internal carotid diameter as the denominator.  Contrast: 50mL OMNIPAQUE IOHEXOL 350 MG/ML SOLN  Comparison:  08/25/2011 CT angio neck.  10/03/2008 CT angio head.  CTA NECK  Findings:  Right chest cardiac pacemaker.  COPD.  Spiculated left upper lobe lesion is new from priors measuring 11 x 15 mm.  In the setting of chronic of tobacco abuse, concern raised for pulmonary neoplasm.  Previously identified and biopsied, spiculated lesion superior segment left lower lobe lesion, not well seen on today's examination.  Stable subcentimeter thyroid nodules.  Negative larynx.  Advanced spondylosis.  Three-vessel arch configuration without proximal stenosis.  Unremarkable right common carotid artery.  Mild nonstenotic posterior wall plaque.  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  No stenosis.  No significant left or right vertebral artery pathology.  Left vertebral dominant.  Unremarkable left common carotid artery.  Posterior wall plaque is non stenotic in the proximal left internal carotid artery. Rim calcified predominately thrombosed 7 x 7 x 8 mm more inferior cervical ICA aneurysm projecting medially is unchanged.  More superiorly, a  partially thrombosed and calcified skull base cervical ICA aneurysm measuring 8 x 10 x 11 mm is also stable. Lateral thrombus extends cephalad to the level of the horizontal petrous/foramen lacerum level.  No ICA dissection.   Review of the MIP images confirms the above findings.  IMPRESSION:  Stable extracranial atherosclerotic change and stable bilateral cervical ICA pseudoaneurysms.  No evidence for enlargement, the dissection, or neck hemorrhage.  New spiculated left upper lobe 11 x 15 mm lesion; in the setting  of COPD cannot exclude squamous cell carcinoma. As the patient has been premedicated, CT chest will be performed shortly with an additional 50 ml of contrast for expeditious  evaluation of the chest and mediastinum given his contrast allergy. Findings discussed with EDP.  CTA HEAD  Findings:  There is no evidence for acute infarction, intracranial hemorrhage, mass lesion, hydrocephalus, or extra-axial fluid.  Mild atrophy.  Mild white matter disease.  Post infusion, no abnormal enhancement.  Intact calvarium.  The  intracranial vasculature is dolichoectatic but patent.  No intracranial dissection or berry aneurysm is present.  The left vertebral is dominant.  There is no  proximal vascular occlusion or significant flow reducing lesion. Similar appearance to priors.   Review of the MIP images confirms the above findings.  IMPRESSION: Unremarkable CTA head.   Original Report Authenticated By: Davonna Belling, M.D.   Ct Chest W Contrast  11/22/2012   *RADIOLOGY REPORT*  Clinical Data: Shortness of breath, weakness, abnormality on recently performed neck CTA.  CT CHEST WITH CONTRAST  Technique:  Multidetector CT imaging of the chest was performed following the standard protocol during bolus administration of intravenous contrast.  Contrast: 50mL OMNIPAQUE IOHEXOL 300 MG/ML  SOLN  Comparison: Neck CT head - earlier same day; chest CT - 07/14/2012; 09/03/2011  Findings:  Stable postsurgical change of the superior segment of the left lower lobe.  While the external diameter of the previous identified approximately 1.3 x 1.0 cm nodule within the left lung apex (image 13, series 3) is grossly unchanged, the nodule appears more consolidated on the present examination, with slight differences possibly attributable to slice selection.  Additional approximately 8 mm ground-glass nodule within the right lower lobe (image 43, series 3) appears grossly unchanged, as does an approximately 5 mm ground-glass nodule within the superior  segment of the lingula (image 34, series 3).  No new discrete pulmonary nodules.  Scattered shoddy mediastinal lymph nodes appear grossly unchanged and are individually not enlarged by size criteria with index pretracheal node measuring 9 mm in short axis diameter (image 23, series 2). No definite hilar or axillary lymphadenopathy.  Advanced centrilobular emphysematous change.  Minimal dependent subsegmental atelectasis.  No focal airspace opacity.  No pleural effusion or pneumothorax.  The central pulmonary airways are patent.  Normal heart size.  Right anterior chest wall dual lead pacemaker. No pericardial effusion.  Normal caliber of the main pulmonary artery. Grossly unchanged mild ectasia of the ascending thoracic aorta measuring approximately 4.1 cm in greatest oblique short axis diameter (image 30, series 2).  Conventional configuration of the aortic arch.  Scattered minimal atherosclerotic plaque within the thoracic aorta.  No definite thoracic aortic dissection or periaortic stranding.  Limited evaluation of the upper abdomen demonstrates a partially exophytic approximately 3.0 cm cyst arising from the posterior superior aspect of the right kidney.  Several additional smaller renal cysts are noted bilaterally.  Post cholecystectomy.  No acute or aggressive osseous abnormalities.  IMPRESSION:  1.  No change to minimal increase in the indeterminate approximately 1.3 cm nodule within the left lung apex, with slight differences in possibly attributable to slice selection.  While possibly representing of scar, given advanced emphysematous change, a slowly growing lung cancer is not excluded.  As such, further evaluation with a follow-up chest CT in 3 months is recommended.  2.  Grossly unchanged bilateral sub centimeter ground-glass nodules, likely too small to accurately characterize.  Continued attention on follow-up is recommended.  3.  Stable postsurgical change of the superior segment of the left lower  lobe.  4.  Grossly unchanged mild fusiform ectasia of the descending thoracic aorta measuring approximately 41 mm in diameter.  Above findings discussed with Dr. Freida Busman at the time of examination completion.   Original Report Authenticated By: Tacey Ruiz, MD   Ct Chest Wo Contrast  07/14/2012  *RADIOLOGY REPORT*  Clinical Data: Follow-up lung cancer.  CT CHEST WITHOUT CONTRAST  Technique:  Multidetector CT imaging of the chest was performed following the standard protocol without IV contrast.  Comparison: CT scan 08/18/2011.  Findings: The chest wall is stable.  A permanent right-sided pacemaker is noted.  No supraclavicular or axillary lymphadenopathy.  The bony thorax is intact.  No destructive bone lesions or spinal canal compromise.  Moderate degenerative changes involving the spine.  The heart is normal in size.  No pericardial effusion.  No mediastinal or hilar lymphadenopathy.  Stable tortuosity and ectasia of the thoracic aorta.  The esophagus is grossly normal.  Examination of the lung parenchyma demonstrates stable advanced emphysematous changes and pulmonary interstitial scarring.  The left upper lobe pulmonary nodule has been excised.  No findings for recurrent or residual tumor.  There is a new left apical lung density.  This could reflect progressive scarring change but does need observation.  There is a 7 mm nodule at the right lung base.  This is difficult to see on the prior CT but was a on the prior PET CT and needs continued observation.  There is an ill-defined nodular density measuring approximate 5 mm in the superior segment of the left lower lobe on image number 31. I believe this was present on the prior CT scan although it is more obvious now.  A 4 mm nodule is noted on image number 22 in the left upper lobe. This was present on the prior study and has not significantly changed.  No acute pulmonary findings or pleural effusion.  The upper abdomen is unremarkable.  Stable right renal  cyst.  IMPRESSION:  1.  Surgical changes from prior excision of a left apical lung neoplasm. 2.  Advanced emphysematous changes and pulmonary scarring. 3.  New 13 x 8 mm left apical lung density.  This could reflect scarring change but needs close observation. 4.  Other small pulmonary nodules as discussed above.  Recommend continued observation. 5.  No acute pulmonary findings and no mediastinal or hilar lymphadenopathy. 6.  Stable tortuosity and ectasia of the thoracic aorta.   Original Report Authenticated By: Rudie Meyer, M.D.       Recent Labs: Lab Results  Component Value Date   WBC 10.4 12/06/2012   HGB 17.5* 12/06/2012   HCT 47.8 12/06/2012   PLT 272 12/06/2012   GLUCOSE 95 11/13/2012   CHOL 192 07/22/2011   TRIG 76 07/22/2011   HDL 57 07/22/2011   LDLCALC 409* 07/22/2011   ALT 11 10/22/2011   AST 19 10/22/2011   NA 138 11/13/2012   K 3.6 11/13/2012   CL 103 11/13/2012   CREATININE 0.99 11/13/2012   BUN 13 11/13/2012   CO2 25 11/13/2012   TSH 4.001 06/13/2011   INR 1.02 12/06/2012   HGBA1C 5.0 06/13/2011      Assessment / Plan:     wedge resection of Stage I INVASIVE MODERATELY DIFFERENTIATED ADENOCARCINOMA, SPANNING 2.2 CM IN GREATEST DIMENSION superior segment left lower lobe  Followup CT scan is reviewed with the patient, patient has diffuse scarring throughout the lungs which has been proven to be another primary lung cancer .  Although the left upper lobe lesion has not significantly changed in size over 4 months it is more prominent.  The patient's 2 previous thoracotomies on the left and limited pulmonary reserve may make surgical resection of recurrent tumor impossible, we have recommended he be considered for stereotactic radiotherapy.  The patient is now on Coumadin for intermittent atrial flutter relation.  I'll plan to see the patient back in the office in 6 months with followup CT of the chest  I've explained to the patient his wife and daughter the importance of continued  follow up, he has numerous areas in  both lungs of scarring that could change her to. There is a 8mm area in the right lung but will need followup, he also has a renal cyst. Because of his pacemaker MRI  have not been ordered.  He is to see Dr. Michell Heinrich tomorrow  Shaquan Puerta B 12/08/2012 11:19 AM

## 2012-12-09 ENCOUNTER — Telehealth: Payer: Self-pay

## 2012-12-09 ENCOUNTER — Encounter: Payer: Self-pay | Admitting: Radiation Oncology

## 2012-12-09 ENCOUNTER — Ambulatory Visit
Admission: RE | Admit: 2012-12-09 | Discharge: 2012-12-09 | Disposition: A | Discharge status: Home / Self Care | Payer: Medicare Other | Source: Ambulatory Visit | Attending: Radiation Oncology | Admitting: Radiation Oncology

## 2012-12-09 DIAGNOSIS — I2510 Atherosclerotic heart disease of native coronary artery without angina pectoris: Secondary | ICD-10-CM | POA: Insufficient documentation

## 2012-12-09 DIAGNOSIS — C3410 Malignant neoplasm of upper lobe, unspecified bronchus or lung: Secondary | ICD-10-CM | POA: Insufficient documentation

## 2012-12-09 DIAGNOSIS — Z950 Presence of cardiac pacemaker: Secondary | ICD-10-CM | POA: Insufficient documentation

## 2012-12-09 DIAGNOSIS — J438 Other emphysema: Secondary | ICD-10-CM | POA: Insufficient documentation

## 2012-12-09 DIAGNOSIS — N281 Cyst of kidney, acquired: Secondary | ICD-10-CM | POA: Insufficient documentation

## 2012-12-09 DIAGNOSIS — Z87891 Personal history of nicotine dependence: Secondary | ICD-10-CM | POA: Insufficient documentation

## 2012-12-09 DIAGNOSIS — E785 Hyperlipidemia, unspecified: Secondary | ICD-10-CM | POA: Insufficient documentation

## 2012-12-09 DIAGNOSIS — I77810 Thoracic aortic ectasia: Secondary | ICD-10-CM | POA: Insufficient documentation

## 2012-12-09 DIAGNOSIS — Z7901 Long term (current) use of anticoagulants: Secondary | ICD-10-CM | POA: Insufficient documentation

## 2012-12-09 DIAGNOSIS — I10 Essential (primary) hypertension: Secondary | ICD-10-CM | POA: Insufficient documentation

## 2012-12-09 DIAGNOSIS — Z86711 Personal history of pulmonary embolism: Secondary | ICD-10-CM | POA: Insufficient documentation

## 2012-12-09 NOTE — Progress Notes (Signed)
Radiation Oncology         [phone]) [phone] ________________________________  Initial outpatient Consultation - Date: 12/09/2012   Name: [name] MRN: [id number]   DOB: 18-Nov-1932  REFERRING PHYSICIAN: [name], MD  DIAGNOSIS:  1. Malignant neoplasm of upper lobe of lung, left     HISTORY OF PRESENT ILLNESS::[name] is a 79 y.o. male  underwent a wedge resection of a T1 B. 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 A followup CT scan in April of this year showed a new left upper lobe lesion. This was followed and it was noted to grow on a scan in August of this year. A followup head CT on 12/01/2012 showed mild hypermetabolic activity corresponding to a left apical nodule measuring 1.5 x 0.9 cm. This had an SUV of 2.7. He underwent a CT-guided biopsy of this nodule which was positive for adenocarcinoma on 12/06/2012. He was not felt to be a surgical candidate and was therefore referred by Dr. Tyrone Sage for consideration of radiation in the management of this newly diagnosed adenocarcinoma. He is really without complaints today. He has some shortness of breath on exertion. He is not oxygen dependent. He does have some shingles over his right anterior chest and has been worked up for headache. He also complains of some visual changes which are related to recently being placed on prednisone. He has had some weight loss. He does have a pacemaker. He has no hemoptysis. No bone pain.Marland Kitchen  PREVIOUS RADIATION THERAPY: No  PAST MEDICAL HISTORY:  has a past medical history of Hyperlipidemia; Hypertension; Esophageal reflux; Diverticulosis; Rheumatic fever (~ 1944); Vision loss of left eye; Kidney stones; Hard of hearing, left; Pulmonary embolism; Hypercalcemia; Arthritis; CAD (coronary artery disease); Prostate cancer; AV block; COPD (chronic obstructive pulmonary disease); Carotid artery aneurysm; Shortness of breath; Constipation; and Shingles  (12/04/2012).    PAST SURGICAL HISTORY: Past Surgical History  Procedure Laterality Date  . Cystoscopy    . Lung surgery  1957    "born w/bleb in [place]  . Insert / replace / remove pacemaker  1991    initial placement  . Insert / replace / remove pacemaker  ~ 2011    "changed out"  . Tonsillectomy and adenoidectomy  1943  . Appendectomy  2000's  . Cholecystectomy  2000's  . Cataract extraction w/ intraocular lens implant  ~ 2008    left eye  . Lithotripsy  ~ 2011    "twice"  . Cardiac catheterization    . Prostate cryoablation  ~ 2008  . Video bronchoscopy  10/20/2011    Procedure: VIDEO BRONCHOSCOPY;  Surgeon: [name], MD;  Location: [facility] OR;  Service: Thoracic;  Laterality: N/A;  . Needle core biopsy  12/06/2012    Lung/lul    FAMILY HISTORY:  Family History  Problem Relation Age of Onset  . Hypertension Neg Hx   . Hyperlipidemia Neg Hx   . Heart failure Neg Hx   . Heart disease Neg Hx   . Colon cancer Neg Hx   . Breast cancer Mother   . Emphysema Sister     was a smoker  . Brain cancer Father 56     type unknown    SOCIAL HISTORY:  History  Substance Use Topics  . Smoking status: Former Smoker -- 1.00 packs/day for 50 years    Types: Cigarettes    Quit date: 03/23/2004  . Smokeless tobacco: Current User    Types:  Chew  . Alcohol Use: No    ALLERGIES: Iohexol and Penicillins  MEDICATIONS:  Current Outpatient Prescriptions  Medication Sig Dispense Refill  . losartan (COZAAR) 50 MG tablet Take 50 mg by mouth daily.      . nitroGLYCERIN (NITROSTAT) 0.4 MG SL tablet Place 1 tablet (0.4 mg total) under the tongue every 5 (five) minutes as needed. For chest pain  30 tablet  3  . predniSONE (DELTASONE) 10 MG tablet Take 10 mg by mouth daily.      . traZODone (DESYREL) 150 MG tablet Take 150 mg by mouth as directed. 1/2 -1 tablet by mouth at bedtime      . warfarin (COUMADIN) 5 MG tablet Take 5 mg by mouth daily.      Marland Kitchen albuterol (PROVENTIL,VENTOLIN) 90  MCG/ACT inhaler Inhale 2 puffs into the lungs every 6 (six) hours as needed. For wheezing or shortness of breath      . ibuprofen (ADVIL) 200 MG tablet Take 200 mg by mouth as needed for pain.       No current facility-administered medications for this encounter.    REVIEW OF SYSTEMS:  A 15 point review of systems is documented in the electronic medical record. This was obtained by the nursing staff. However, I reviewed this with the patient to discuss relevant findings and make appropriate changes.  Pertinent items are noted in HPI.  PHYSICAL EXAM:  Filed Vitals:   12/09/12 0926  BP: 113/50  Pulse: 108  Temp: 98.1 F (36.7 C)  .161 lb 14.4 oz (73.437 kg). ECOG = 0  LABORATORY DATA:  Lab Results  Component Value Date   WBC 10.4 12/06/2012   HGB 17.5* 12/06/2012   HCT 47.8 12/06/2012   MCV 93.0 12/06/2012   PLT 272 12/06/2012   Lab Results  Component Value Date   NA 138 11/13/2012   K 3.6 11/13/2012   CL 103 11/13/2012   CO2 25 11/13/2012   Lab Results  Component Value Date   ALT 11 10/22/2011   AST 19 10/22/2011   ALKPHOS 80 10/22/2011   BILITOT 1.1 10/22/2011     RADIOGRAPHY: Ct Angio Head W/cm &/or Wo Cm  11/13/2012   *RADIOLOGY REPORT*  Clinical Data:  Numbness all over.  History of bilateral ICA pseudoaneurysms.  Headache.  THE PATIENT WAS PREMEDICATED WITH IV STEROIDS BECAUSE OF THE HISTORY OF CONTRAST ALLERGY.  THERE WERE NO ADVERSE EVENTS.  CT ANGIOGRAPHY HEAD AND NECK  Technique:  Multidetector CT imaging of the head and neck was performed using the standard protocol during bolus administration of intravenous contrast.  Multiplanar CT image reconstructions including MIPs were obtained to evaluate the vascular anatomy. Carotid stenosis measurements (when applicable) are obtained utilizing NASCET criteria, using the distal internal carotid diameter as the denominator.  Contrast: 50mL OMNIPAQUE IOHEXOL 350 MG/ML SOLN  Comparison:  08/25/2011 CT angio neck.  10/03/2008 CT angio head.   CTA NECK  Findings:  Right chest cardiac pacemaker.  COPD.  Spiculated left upper lobe lesion is new from priors measuring 11 x 15 mm.  In the setting of chronic of tobacco abuse, concern raised for pulmonary neoplasm.  Previously identified and biopsied, spiculated lesion superior segment left lower lobe lesion, not well seen on today's examination.  Stable subcentimeter thyroid nodules.  Negative larynx.  Advanced spondylosis.  Three-vessel arch configuration without proximal stenosis.  Unremarkable right common carotid artery.  Mild nonstenotic posterior wall plaque.  Tortuous cervical right internal carotid artery with a fusiform upper  cervical ICA pseudoaneurysm measuring 8 x 8 x 9 mm (image 119 series 6) is stable from priors.  No stenosis.  No significant left or right vertebral artery pathology.  Left vertebral dominant.  Unremarkable left common carotid artery.  Posterior wall plaque is non stenotic in the proximal left internal carotid artery. Rim calcified predominately thrombosed 7 x 7 x 8 mm more inferior cervical ICA aneurysm projecting medially is unchanged.  More superiorly, a  partially thrombosed and calcified skull base cervical ICA aneurysm measuring 8 x 10 x 11 mm is also stable. Lateral thrombus extends cephalad to the level of the horizontal petrous/foramen lacerum level.  No ICA dissection.   Review of the MIP images confirms the above findings.  IMPRESSION:  Stable extracranial atherosclerotic change and stable bilateral cervical ICA pseudoaneurysms.  No evidence for enlargement, the dissection, or neck hemorrhage.  New spiculated left upper lobe 11 x 15 mm lesion; in the setting of COPD cannot exclude squamous cell carcinoma. As the patient has been premedicated, CT chest will be performed shortly with an additional 50 ml of contrast for expeditious  evaluation of the chest and mediastinum given his contrast allergy. Findings discussed with EDP.  CTA HEAD  Findings:  There is no evidence  for acute infarction, intracranial hemorrhage, mass lesion, hydrocephalus, or extra-axial fluid.  Mild atrophy.  Mild white matter disease.  Post infusion, no abnormal enhancement.  Intact calvarium.  The  intracranial vasculature is dolichoectatic but patent.  No intracranial dissection or berry aneurysm is present.  The left vertebral is dominant.  There is no proximal vascular occlusion or significant flow reducing lesion. Similar appearance to priors.   Review of the MIP images confirms the above findings.  IMPRESSION: Unremarkable CTA head.   Original Report Authenticated By: Davonna Belling, M.D.   Dg Chest 1 View  12/06/2012   CLINICAL DATA:  Postop lung biopsy.  EXAM: CHEST - 1 VIEW  COMPARISON:  CT-guided lung biopsy imaging performed today.  FINDINGS: No pneumothorax following left lung biopsy. COPD changes in the lungs. Right pacer in place. Heart is normal size. No effusions or acute bony abnormality.  IMPRESSION: No pneumothorax following left lung biopsy.   Electronically Signed   By: Charlett Nose M.D.   On: 12/06/2012 13:21   Ct Angio Neck W/cm &/or Wo/cm  11/13/2012   *RADIOLOGY REPORT*  Clinical Data:  Numbness all over.  History of bilateral ICA pseudoaneurysms.  Headache.  THE PATIENT WAS PREMEDICATED WITH IV STEROIDS BECAUSE OF THE HISTORY OF CONTRAST ALLERGY.  THERE WERE NO ADVERSE EVENTS.  CT ANGIOGRAPHY HEAD AND NECK  Technique:  Multidetector CT imaging of the head and neck was performed using the standard protocol during bolus administration of intravenous contrast.  Multiplanar CT image reconstructions including MIPs were obtained to evaluate the vascular anatomy. Carotid stenosis measurements (when applicable) are obtained utilizing NASCET criteria, using the distal internal carotid diameter as the denominator.  Contrast: 50mL OMNIPAQUE IOHEXOL 350 MG/ML SOLN  Comparison:  08/25/2011 CT angio neck.  10/03/2008 CT angio head.  CTA NECK  Findings:  Right chest cardiac pacemaker.  COPD.   Spiculated left upper lobe lesion is new from priors measuring 11 x 15 mm.  In the setting of chronic of tobacco abuse, concern raised for pulmonary neoplasm.  Previously identified and biopsied, spiculated lesion superior segment left lower lobe lesion, not well seen on today's examination.  Stable subcentimeter thyroid nodules.  Negative larynx.  Advanced spondylosis.  Three-vessel arch configuration without  proximal stenosis.  Unremarkable right common carotid artery.  Mild nonstenotic posterior wall plaque.  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  No stenosis.  No significant left or right vertebral artery pathology.  Left vertebral dominant.  Unremarkable left common carotid artery.  Posterior wall plaque is non stenotic in the proximal left internal carotid artery. Rim calcified predominately thrombosed 7 x 7 x 8 mm more inferior cervical ICA aneurysm projecting medially is unchanged.  More superiorly, a  partially thrombosed and calcified skull base cervical ICA aneurysm measuring 8 x 10 x 11 mm is also stable. Lateral thrombus extends cephalad to the level of the horizontal petrous/foramen lacerum level.  No ICA dissection.   Review of the MIP images confirms the above findings.  IMPRESSION:  Stable extracranial atherosclerotic change and stable bilateral cervical ICA pseudoaneurysms.  No evidence for enlargement, the dissection, or neck hemorrhage.  New spiculated left upper lobe 11 x 15 mm lesion; in the setting of COPD cannot exclude squamous cell carcinoma. As the patient has been premedicated, CT chest will be performed shortly with an additional 50 ml of contrast for expeditious  evaluation of the chest and mediastinum given his contrast allergy. Findings discussed with EDP.  CTA HEAD  Findings:  There is no evidence for acute infarction, intracranial hemorrhage, mass lesion,  hydrocephalus, or extra-axial fluid.  Mild atrophy.  Mild white matter disease.  Post infusion, no abnormal enhancement.  Intact calvarium.  The  intracranial vasculature is dolichoectatic but patent.  No intracranial dissection or berry aneurysm is present.  The left vertebral is dominant.  There is no proximal vascular occlusion or significant flow reducing lesion. Similar appearance to priors.   Review of the MIP images confirms the above findings.  IMPRESSION: Unremarkable CTA head.   Original Report Authenticated By: Davonna Belling, M.D.   Ct Chest W Contrast  11/22/2012   *RADIOLOGY REPORT*  Clinical Data: Shortness of breath, weakness, abnormality on recently performed neck CTA.  CT CHEST WITH CONTRAST  Technique:  Multidetector CT imaging of the chest was performed following the standard protocol during bolus administration of intravenous contrast.  Contrast: 50mL OMNIPAQUE IOHEXOL 300 MG/ML  SOLN  Comparison: Neck CT head - earlier same day; chest CT - 07/14/2012; 09/03/2011  Findings:  Stable postsurgical change of the superior segment of the left lower lobe.  While the external diameter of the previous identified approximately 1.3 x 1.0 cm nodule within the left lung apex (image 13, series 3) is grossly unchanged, the nodule appears more consolidated on the present examination, with slight differences possibly attributable to slice selection.  Additional approximately 8 mm ground-glass nodule within the right lower lobe (image 43, series 3) appears grossly unchanged, as does an approximately 5 mm ground-glass nodule within the superior segment of the lingula (image 34, series 3).  No new discrete pulmonary nodules.  Scattered shoddy mediastinal lymph nodes appear grossly unchanged and are individually not enlarged by size criteria with index pretracheal node measuring 9 mm in short axis diameter (image 23, series 2). No definite hilar or axillary lymphadenopathy.  Advanced centrilobular emphysematous  change.  Minimal dependent subsegmental atelectasis.  No focal airspace opacity.  No pleural effusion or pneumothorax.  The central pulmonary airways are patent.  Normal heart size.  Right anterior chest wall dual lead pacemaker. No pericardial effusion.  Normal caliber of the main pulmonary  artery. Grossly unchanged mild ectasia of the ascending thoracic aorta measuring approximately 4.1 cm in greatest oblique short axis diameter (image 30, series 2).  Conventional configuration of the aortic arch.  Scattered minimal atherosclerotic plaque within the thoracic aorta.  No definite thoracic aortic dissection or periaortic stranding.  Limited evaluation of the upper abdomen demonstrates a partially exophytic approximately 3.0 cm cyst arising from the posterior superior aspect of the right kidney.  Several additional smaller renal cysts are noted bilaterally.  Post cholecystectomy.  No acute or aggressive osseous abnormalities.  IMPRESSION:  1.  No change to minimal increase in the indeterminate approximately 1.3 cm nodule within the left lung apex, with slight differences in possibly attributable to slice selection.  While possibly representing of scar, given advanced emphysematous change, a slowly growing lung cancer is not excluded.  As such, further evaluation with a follow-up chest CT in 3 months is recommended.  2.  Grossly unchanged bilateral sub centimeter ground-glass nodules, likely too small to accurately characterize.  Continued attention on follow-up is recommended.  3.  Stable postsurgical change of the superior segment of the left lower lobe.  4.  Grossly unchanged mild fusiform ectasia of the descending thoracic aorta measuring approximately 41 mm in diameter.  Above findings discussed with Dr. Freida Busman at the time of examination completion.   Original Report Authenticated By: Tacey Ruiz, MD   Nm Pet Image Restag (ps) Skull Base To Thigh  12/01/2012   CLINICAL DATA:  Subsequent treatment strategy for  restaging of lung cancer. Rule out left-sided recurrence.  EXAM: NUCLEAR MEDICINE PET SKULL BASE TO THIGH  FASTING BLOOD GLUCOSE:  Value:  110 mg/dl  TECHNIQUE: 16.1 mCi W-96 FDG was injected intravenously. CT data was obtained and used for attenuation correction and anatomic localization only. (This was not acquired as a diagnostic CT examination.) Additional exam technical data entered on technologist worksheet.  COMPARISON:  Chest CT 11/13/2012. Prior PET 09/03/2011.  FINDINGS: NECK  No areas of abnormal hypermetabolism.  CHEST  Mild hypermetabolism which corresponds to the spiculated left apical lung nodule. This measures 1.5 x 0.9 cm and a S.U.V. max of 2.7 on image 65/ series 2. No other areas of abnormal activity within the lungs, mediastinum, or hila. A right axillary lymph node measures 8 mm and a S.U.V. max of 2.3 on image 272/series 2. This maintains its fatty hilum, suggesting benignity.  ABDOMEN/PELVIS  No areas of abnormal hypermetabolism.  SKELETON  No abnormal marrow activity.  CT IMAGES PERFORMED FOR ATTENUATION CORRECTION DEMONSTRATE  No significant findings within the neck. A presumed sebaceous cyst about the posterior right back measures 6.3 cm on image 43/ series 2. This is positioned cephalad the T1 level.  Chest findings deferred to recent diagnostic CT. An 8 mm right lower lobe lung nodule which is likely below the resolution of PET. Moderate centrilobular emphysema. Surgical changes within the superior segment left lower lobe.  Bilateral low-density renal lesions, which are likely cysts. Interpolar left renal lesion measures 1.1 cm and 44 HU on image 155/series 2. This is indeterminate. Likely enlarged from 8 mm on 09/03/2011 PET.  Left renal collecting system calculus. Mild ectasia of the celiac origin at 9 mm on image 135/series 2. Cholecystectomy.  Mild aneurysmal dilatation of the right common iliac artery at 1.6 cm on image 188/series 2, chronic. Mild bladder wall thickening, suggesting  a component of outlet obstruction.  IMPRESSION: 1. The left upper lobe spiculated nodule demonstrates mild hypermetabolism, suspicious for metachronous primary  bronchogenic carcinoma. Tissue sampling should be considered. 2. No evidence of thoracic nodal or extra thoracic metastatic disease. 3. A left renal lesion which measures greater than fluid density and has enlarged since 2013. This could represent a hemorrhagic cyst or a solid neoplasm. Consider definitive characterization with pre and post contrast abdominal MRI (preferred) or CT. 4. Incidental findings, including left nephrolithiasis and right common iliac artery aneurysm.   Electronically Signed   By: Jeronimo Greaves   On: 12/01/2012 16:35   Ct Biopsy  12/06/2012   *RADIOLOGY REPORT*  Clinical Data/Indication: LEFT UPPER LOBE LUNG NODULE  CT-GUIDED BIOPSY OF A LEFT UPPER LOBE LUNG NODULE.  CORE.  Sedation: Versed 4.0 mg, Fentanyl 50 mcg.  Total Moderate Sedation Time: 13 minutes.  Procedure: The procedure, risks, benefits, and alternatives were explained to the patient. Questions regarding the procedure were encouraged and answered. The patient understands and consents to the procedure.  The left upper posterior thorax was prepped with betadine in a sterile fashion, and a sterile drape was applied covering the operative field. A sterile gown and sterile gloves were used for the procedure.  Under CT guidance, a(n) 19 gauge guide needle was advanced into the left upper lobe lung nodule. Subsequently three 20 gauge core biopsies were obtained. The guide needle was removed. Final imaging was performed.  Patient tolerated the procedure well without complication.  Vital sign monitoring by nursing staff during the procedure will continue as patient is in the special procedures unit for post procedure observation.  Findings: The images document guide needle placement within the left upper lobe lung nodule.  Post biopsy images demonstrate no pneumothorax.   IMPRESSION: Successful CT-guided core biopsy of a left upper lobe lung nodule.   Original Report Authenticated By: Jolaine Click, M.D.      IMPRESSION: T1 and a 0 adenocarcinoma of the left upper lobe in the setting of a prior wedge resection for a left lower lobe cancer about a year ago.  PLAN: I discussed with Mr. Huot and his wife the role of radiation in the treatment of early stage lung cancer. We discussed historically that radiation control rates were low but would newer techniques including SBRT your control rates approached 80-90%. We discussed the possibility of elsewhere recurrence given his adenocarcinoma and his previous surgery. We discussed the low likelihood of significant damage to his intrathoracic structures. We discussed the most common side effects during treatment include shoulder pain and fatigue. We discussed the use of abdominal compression for respiratory control. We discussed the need for simulation and treatment planning followed by 3-5 treatments as an outpatient. He would location the apex I think he should be good for 3 treatments and I scheduled him for simulation next week. He has signed informed consent and agree to proceed forward.  I spent 40 minutes  face to face with the patient and more than 50% of that time was spent in counseling and/or coordination of care.   ------------------------------------------------  Lurline Hare, MD

## 2012-12-09 NOTE — Progress Notes (Signed)
Please see the Nurse Progress Note in the MD Initial Consult Encounter for this patient. 

## 2012-12-09 NOTE — Telephone Encounter (Signed)
[facility] notified and confirmed pacemaker form has been received.

## 2012-12-13 ENCOUNTER — Ambulatory Visit
Admission: RE | Admit: 2012-12-13 | Discharge: 2012-12-13 | Disposition: A | Discharge status: Home / Self Care | Payer: Medicare Other | Source: Ambulatory Visit | Attending: Radiation Oncology | Admitting: Radiation Oncology

## 2012-12-13 DIAGNOSIS — Z510 Encounter for antineoplastic radiation therapy: Secondary | ICD-10-CM | POA: Insufficient documentation

## 2012-12-13 DIAGNOSIS — C3490 Malignant neoplasm of unspecified part of unspecified bronchus or lung: Secondary | ICD-10-CM | POA: Insufficient documentation

## 2012-12-13 NOTE — Progress Notes (Signed)
[facility] Radiation Oncology Simulation and Treatment Planning Note   Name: [name] MRN: [id number]  Date: 12/13/2012  DOB: 01/05/1933  Status: outpatient  DIAGNOSIS: There were no encounter diagnoses.  SIDE: left  CONSENT VERIFIED: yes  SET UP AND IMMOBILIZATION: Patient is setup supine in a vac loc with a custom moldable pillow for head and neck immobilization   NARRATIVE: The patient was brought to the CT Simulation planning suite.  Identity was confirmed.  All relevant records and images related to the planned course of therapy were reviewed.  Then, the patient was positioned in a stable reproducible clinical set-up for radiation therapy.  CT images were obtained.  Skin markings were placed.  A four dimensional simulation was then performed to track tumor movement throughout the patients' breathing cycle. The CT images were loaded into the planning software where the target and avoidance structures were contoured.  The GTV was outlined on the free breathing, 4D and MIP image sets.  The radiation prescription was entered and confirmed.   TREATMENT PLANNING NOTE:  Treatment planning then occurred. I have requested 3D simulation with [facility] of the spinal cord, total lungs and gross tumor volume. I have also requested mlcs and an isodose plan.   Special treatment procedure will be performed as [name] will be receiving high dose per fraction.

## 2012-12-16 NOTE — Addendum Note (Signed)
Encounter addended by: Bard Haupert Mintz Leighanne Adolph, RN on: 12/16/2012  9:17 AM<BR>     Documentation filed: Charges VN

## 2012-12-19 ENCOUNTER — Encounter: Payer: Self-pay | Admitting: Medical Oncology

## 2012-12-20 ENCOUNTER — Telehealth: Payer: Self-pay | Admitting: Medical Oncology

## 2012-12-20 ENCOUNTER — Encounter: Payer: Self-pay | Admitting: Medical Oncology

## 2012-12-20 ENCOUNTER — Encounter: Payer: Self-pay | Admitting: *Deleted

## 2012-12-20 NOTE — Telephone Encounter (Signed)
sent to dr Arbutus Ped

## 2012-12-20 NOTE — Progress Notes (Signed)
EGFR + Exon 20

## 2012-12-26 ENCOUNTER — Encounter: Payer: Self-pay | Admitting: *Deleted

## 2012-12-26 ENCOUNTER — Ambulatory Visit (INDEPENDENT_AMBULATORY_CARE_PROVIDER_SITE_OTHER): Payer: Medicare Other | Admitting: *Deleted

## 2012-12-26 DIAGNOSIS — Z950 Presence of cardiac pacemaker: Secondary | ICD-10-CM

## 2012-12-26 DIAGNOSIS — I442 Atrioventricular block, complete: Secondary | ICD-10-CM

## 2012-12-26 NOTE — Progress Notes (Signed)
[facility] Psychosocial Distress Screening Clinical Social Work  Clinical Social Work was referred by distress screening protocol.  The patient scored a 6 on the Psychosocial Distress Thermometer which indicates moderate distress. Clinical Social Worker contacted patient by phone to assess for distress and other psychosocial needs. Mr. [name] shared he is "not feeling well" and plans to discuss physical symptoms with RN/MD tomorrow at radiation oncology appointment.  Patient reports feeling that he is "handling things well" and has no emotional concerns at this time.  CSW briefly explained role and support services at cancer center and encouraged patient to call CSW as needed.   Clinical Social Worker follow up needed: no  If yes, follow up plan:   [name], MSW, LCSW Clinical Social Worker [facility] [phone]

## 2012-12-27 ENCOUNTER — Ambulatory Visit
Admission: RE | Admit: 2012-12-27 | Discharge: 2012-12-27 | Disposition: A | Discharge status: Home / Self Care | Payer: Medicare Other | Source: Ambulatory Visit | Attending: Radiation Oncology | Admitting: Radiation Oncology

## 2012-12-27 ENCOUNTER — Encounter: Payer: Self-pay | Admitting: Radiation Oncology

## 2012-12-27 LAB — REMOTE PACEMAKER DEVICE
AL AMPLITUDE: 4.1 mv
AL IMPEDENCE PM: 440 Ohm
BATTERY VOLTAGE: 2.96 V
RV LEAD AMPLITUDE: 6.4 mv
RV LEAD IMPEDENCE PM: 310 Ohm

## 2012-12-27 NOTE — Progress Notes (Signed)
Weekly rad txs 1/3  lt lung , poor appetite, took pepto-bismul yesterday for nausea,  None today, pain where shingles resolving on right side abdomen to side, room air sats=100% , no coughs, fatigue "fair" stated patient, stopped prednisone stated "It affected my vision=blurry,dizzy', had toast,choc milk for breakfast, no lunch as yet, no difficulty swallowing, discusses side effects, pain,skin irritation, shoulder pain, verbal understanding,pt education done

## 2012-12-27 NOTE — Progress Notes (Signed)
Weekly Management Note Current Dose:  18 Gy  Projected Dose: 54 Gy   Narrative:  The patient presents for routine under treatment assessment.  CBCT/MVCT images/Port film x-rays were reviewed.  The chart was checked. Doing well. Some indigestion. Wife is going to call gastroenterologist if it does not improve. Shingles resolving.   Physical Findings: Weight: 160 lb 6.4 oz (72.757 kg). Unchanged  Impression:  The patient is tolerating radiation.  Plan:  Continue treatment as planned. Follow up in 1 month.

## 2012-12-27 NOTE — Progress Notes (Signed)
  Radiation Oncology         [phone] ________________________________  Name: [name] MRN: [id number]  Date: 12/27/2012  DOB: 1932/06/01  Stereotactic Body Radiotherapy Treatment Procedure Note  NARRATIVE:  [name] was brought to the stereotactic radiation treatment machine and placed supine on the CT couch. The patient was set up for stereotactic body radiotherapy on the body fix pillow.  3D TREATMENT PLANNING AND DOSIMETRY:  The patient's radiation plan was reviewed and approved prior to starting treatment.  It showed 3-dimensional radiation distributions overlaid onto the planning CT.  The [facility] for the target structures as well as the organs at risk were reviewed. The documentation of this is filed in the radiation oncology EMR.  SIMULATION VERIFICATION:  The patient underwent CT imaging on the treatment unit.  These were carefully aligned to document that the ablative radiation dose would cover the target volume and maximally spare the nearby organs at risk according to the planned distribution.  SPECIAL TREATMENT PROCEDURE: [name] received high dose ablative stereotactic body radiotherapy to the planned target volume without unforeseen complications. Treatment was delivered uneventfully. The high doses associated with stereotactic body radiotherapy and the significant potential risks require careful treatment set up and patient monitoring constituting a special treatment procedure   STEREOTACTIC TREATMENT MANAGEMENT:  Following delivery, the patient was evaluated clinically. The patient tolerated treatment without significant acute effects, and was discharged to home in stable condition.    PLAN: Continue treatment as planned.  _________________________   Lurline Hare, MD

## 2012-12-29 ENCOUNTER — Ambulatory Visit
Admission: RE | Admit: 2012-12-29 | Discharge: 2012-12-29 | Disposition: A | Discharge status: Home / Self Care | Payer: Medicare Other | Source: Ambulatory Visit | Attending: Radiation Oncology | Admitting: Radiation Oncology

## 2012-12-29 NOTE — Progress Notes (Signed)
  Radiation Oncology         [phone] ________________________________  Name: TREVEON BOURCIER MRN: [id number]  Date: 12/29/2012  DOB: 20-Mar-1933  Stereotactic Body Radiotherapy Treatment Procedure Note (#2 of 3 fractions)  NARRATIVE:  [name] was brought to the stereotactic radiation treatment machine and placed supine on the CT couch. The patient was set up for stereotactic body radiotherapy on the body fix pillow.  3D TREATMENT PLANNING AND DOSIMETRY:  The patient's radiation plan was reviewed and approved prior to starting treatment.  It showed 3-dimensional radiation distributions overlaid onto the planning CT.  The [facility] for the target structures as well as the organs at risk were reviewed. The documentation of this is filed in the radiation oncology EMR.  SIMULATION VERIFICATION:  The patient underwent CT imaging on the treatment unit.  These were carefully aligned to document that the ablative radiation dose would cover the target volume and maximally spare the nearby organs at risk according to the planned distribution.  SPECIAL TREATMENT PROCEDURE: [name] received high dose ablative stereotactic body radiotherapy to the planned target volume without unforeseen complications. Treatment was delivered uneventfully. The high doses associated with stereotactic body radiotherapy and the significant potential risks require careful treatment set up and patient monitoring constituting a special treatment procedure   STEREOTACTIC TREATMENT MANAGEMENT:  Following delivery, the patient was evaluated clinically. The patient tolerated treatment without significant acute effects, and was discharged to home in stable condition.    PLAN: Continue treatment as planned.  _________________________   Lurline Hare, MD

## 2013-01-03 ENCOUNTER — Encounter: Payer: Self-pay | Admitting: Radiation Oncology

## 2013-01-03 ENCOUNTER — Ambulatory Visit
Admission: RE | Admit: 2013-01-03 | Discharge: 2013-01-03 | Disposition: A | Discharge status: Home / Self Care | Payer: Medicare Other | Source: Ambulatory Visit | Attending: Radiation Oncology | Admitting: Radiation Oncology

## 2013-01-03 NOTE — Progress Notes (Signed)
  Radiation Oncology         [phone] ________________________________  Name: [name] MRN: [id number]  Date: 01/03/2013  DOB: 19-Apr-1932  Stereotactic Body Radiotherapy Treatment Procedure Note (#3/3)  NARRATIVE:  [name] was brought to the stereotactic radiation treatment machine and placed supine on the CT couch. The patient was set up for stereotactic body radiotherapy on the body fix pillow.  3D TREATMENT PLANNING AND DOSIMETRY:  The patient's radiation plan was reviewed and approved prior to starting treatment.  It showed 3-dimensional radiation distributions overlaid onto the planning CT.  The [facility] for the target structures as well as the organs at risk were reviewed. The documentation of this is filed in the radiation oncology EMR.  SIMULATION VERIFICATION:  The patient underwent CT imaging on the treatment unit.  These were carefully aligned to document that the ablative radiation dose would cover the target volume and maximally spare the nearby organs at risk according to the planned distribution.  SPECIAL TREATMENT PROCEDURE: [name] received high dose ablative stereotactic body radiotherapy to the planned target volume without unforeseen complications. Treatment was delivered uneventfully. The high doses associated with stereotactic body radiotherapy and the significant potential risks require careful treatment set up and patient monitoring constituting a special treatment procedure   STEREOTACTIC TREATMENT MANAGEMENT:  Following delivery, the patient was evaluated clinically. The patient tolerated treatment without significant acute effects, and was discharged to home in stable condition.    PLAN: Continue treatment as planned.  _________________________   Lurline Hare, MD

## 2013-01-11 ENCOUNTER — Other Ambulatory Visit: Payer: Self-pay | Admitting: Vascular Surgery

## 2013-01-11 ENCOUNTER — Encounter: Payer: Self-pay | Admitting: *Deleted

## 2013-01-11 NOTE — Progress Notes (Signed)
  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   He had no increase in his respiratory symptoms.  Plan: The patient has completed radiation treatment. The patient will return to radiation oncology clinic for routine followup in one month. I advised them to call or return sooner if they have any questions or concerns related to their recovery or treatment.  ------------------------------------------------  Lurline Hare, MD

## 2013-01-20 ENCOUNTER — Encounter: Payer: Self-pay | Admitting: Radiation Oncology

## 2013-01-20 NOTE — Progress Notes (Signed)
Motion Management Note:  On 12/22/12 [name] under went 4D CT and abdominal compression for simulation for SBRT.  The $D CT images were reconstructed and contours were made on the freebreathing and MIP data sets. The ITV was then confirmed using the cine mode of the 4D CT.

## 2013-01-27 ENCOUNTER — Telehealth: Payer: Self-pay | Admitting: *Deleted

## 2013-01-27 ENCOUNTER — Encounter: Payer: Self-pay | Admitting: Internal Medicine

## 2013-01-27 ENCOUNTER — Ambulatory Visit
Admission: RE | Admit: 2013-01-27 | Discharge: 2013-01-27 | Disposition: A | Discharge status: Home / Self Care | Payer: Medicare Other | Source: Ambulatory Visit | Attending: Radiation Oncology | Admitting: Radiation Oncology

## 2013-01-27 ENCOUNTER — Encounter: Payer: Self-pay | Admitting: Radiation Oncology

## 2013-01-27 NOTE — Progress Notes (Signed)
[name] here for an assessment s/p radiation therapy to the left lung with completed on 01/03/13.   He denies any pain, and reports "some" SOB when going upstairs.  He is accompanied by his spouse.  He was reminded about his 03/30/13 appointment with cardiology for a pacemaker check and his spouse stated they are aware.

## 2013-01-27 NOTE — Telephone Encounter (Signed)
CALLED PATIENT TO INFORM OF TESTS AND APPT. WITH DR. Tyrone Sage ON 08-31-13 AT 10:30 AM, LVM FOR A RETURN CALL

## 2013-01-27 NOTE — Progress Notes (Signed)
   Department of Radiation Oncology  Phone:  [phone] Fax:        [phone]   Name: [name] MRN: [id number]  DOB: March 29, 1932  Date: 01/27/2013  Follow Up Visit Note  Diagnosis: T1 N0 adenocarcinoma of the left upper lobe status post SBRT to a total dose of 54 gray in 3 fractions completed 01/03/2013  Interval History: [name] presents today for routine followup.  He has stable shortness of breath symptoms. He has not seen cardiology to interrogate his pacemaker. He has an appointment to see Dr. Tyrone Sage in 6 months. He is accompanied by his wife today. He reports no pain hemoptysis headaches or joint pain.  Allergies:  Allergies  Allergen Reactions  . Iohexol      Desc: PT STATED TODAY HE GETS SLIGHT SOB FROM IV CONTRAST. HE NEEDS FULL PREMEDS FROM NOW ON PER DR. MATTERN   . Penicillins Itching and Rash    "haven't took none in 40 years"    Medications:  Current Outpatient Prescriptions  Medication Sig Dispense Refill  . ibuprofen (ADVIL) 200 MG tablet Take 200 mg by mouth as needed for pain.      Marland Kitchen losartan (COZAAR) 50 MG tablet Take 50 mg by mouth daily.      . mirtazapine (REMERON) 15 MG tablet Take 15 mg by mouth.       . nitroGLYCERIN (NITROSTAT) 0.4 MG SL tablet Place 1 tablet (0.4 mg total) under the tongue every 5 (five) minutes as needed. For chest pain  30 tablet  3  . warfarin (COUMADIN) 5 MG tablet Take 5 mg by mouth daily.      Marland Kitchen albuterol (PROVENTIL,VENTOLIN) 90 MCG/ACT inhaler Inhale 2 puffs into the lungs every 6 (six) hours as needed. For wheezing or shortness of breath       No current facility-administered medications for this encounter.    Physical Exam:  Filed Vitals:   01/27/13 1356  BP: 142/70  Pulse: 118  Temp: 98.4 F (36.9 C)  Resp: 16   he is a pleasant male in no distress sitting comfortably on examining room table.  IMPRESSION: [name] is a 79 y.o. male status post SBRT for an early stage adenocarcinoma of the left upper lobe  with no acute side effects.  PLAN:  I will plan on seeing him back in a year. I have ordered a scan for a month from now which will be about 2 months out from his treatment. This should give [place] good baseline imaging. I have been ordered scans for 6 months from now prior to his appointment Dr. Tyrone Sage. I have ordered and without contrast due to his contrast allergy. I encouraged him to call with any questions or concerns. He has had his flu shot. I encouraged him to followup with his primary care physician regarding his pneumonia shot.    Lurline Hare, MD

## 2013-01-27 NOTE — Progress Notes (Signed)
Called the [facility] clinic and left voice message regarding the time frame s/p radiation therapy for the pacemaker check on [name] which is scheduled for March 30, 2013.  [name] completed radiation to his left lung on 01/03/13.  Requested that [name] be called to confirm his appointment date for his devices check.

## 2013-02-01 ENCOUNTER — Other Ambulatory Visit: Payer: Self-pay | Admitting: *Deleted

## 2013-02-24 ENCOUNTER — Telehealth: Payer: Self-pay | Admitting: Vascular Surgery

## 2013-02-24 NOTE — Telephone Encounter (Addendum)
Set up carotid bilat + TFE for 09/12/13, carotid @ 10:30, TFE @ 11:45, mailed appt letter to pt to inform them of appt  Message copied by Warrick Parisian A on Fri Feb 24, 2013 10:45 AM ------      Message from: EARLY, TODD F      Created: Wed Feb 22, 2013 10:19 AM      Regarding: RE: CTA neck       OK to see me in office with carotid duplex.  Looks like follow-up was to be in 6/15.      TFE      ----- Message -----         From: Jena Gauss         Sent: 02/01/2013  12:41 PM           To: Larina Earthly, MD      Subject: RE: CTA neck                                             Hi Dr. Arbie Cookey,            Unfortunately, the pt has a [employer] pacemaker and so cannot have an MRA.            Would you like him to have a carotid doppler at the office?            Thank you,      Elon Jester      ----- Message -----         From: Larina Earthly, MD         Sent: 01/31/2013   2:30 PM           To: Jena Gauss      Subject: RE: CTA neck                                             MRI/MRA with contrast is OK       Thanks            ----- Message -----         From: Jena Gauss         Sent: 01/05/2013  11:20 AM           To: Larina Earthly, MD      Subject: CTA neck                                                 Hello Dr. Arbie Cookey,            You wanted this pt to have a CTA neck next year; however, the pt is allergic to the contrast dye, symptoms being shortness of breath.            I spoke to [facility] and the radiology department at [facility] and they informed me that since the pt's symptoms are shortness of breath they will not preform a CTA with contrast.             Unfortunately, a CTA neck must be preformed with contrast. The  other option is to order an MRI.            Would you like this pt to have an MRI of the neck instead?            Thank you,      Elon Jester                   ------

## 2013-02-27 ENCOUNTER — Ambulatory Visit (HOSPITAL_COMMUNITY)
Admission: RE | Admit: 2013-02-27 | Discharge: 2013-02-27 | Disposition: A | Discharge status: Home / Self Care | Payer: Medicare Other | Source: Ambulatory Visit | Attending: Radiation Oncology | Admitting: Radiation Oncology

## 2013-02-27 DIAGNOSIS — C3490 Malignant neoplasm of unspecified part of unspecified bronchus or lung: Secondary | ICD-10-CM | POA: Insufficient documentation

## 2013-02-27 DIAGNOSIS — N289 Disorder of kidney and ureter, unspecified: Secondary | ICD-10-CM | POA: Insufficient documentation

## 2013-02-27 DIAGNOSIS — R911 Solitary pulmonary nodule: Secondary | ICD-10-CM | POA: Insufficient documentation

## 2013-02-27 DIAGNOSIS — J984 Other disorders of lung: Secondary | ICD-10-CM | POA: Insufficient documentation

## 2013-02-27 DIAGNOSIS — J449 Chronic obstructive pulmonary disease, unspecified: Secondary | ICD-10-CM | POA: Insufficient documentation

## 2013-02-27 DIAGNOSIS — J4489 Other specified chronic obstructive pulmonary disease: Secondary | ICD-10-CM | POA: Insufficient documentation

## 2013-03-01 ENCOUNTER — Encounter (INDEPENDENT_AMBULATORY_CARE_PROVIDER_SITE_OTHER): Payer: Self-pay

## 2013-03-01 ENCOUNTER — Encounter (INDEPENDENT_AMBULATORY_CARE_PROVIDER_SITE_OTHER): Payer: Self-pay | Admitting: General Surgery

## 2013-03-01 ENCOUNTER — Ambulatory Visit (INDEPENDENT_AMBULATORY_CARE_PROVIDER_SITE_OTHER): Payer: Medicare Other | Admitting: General Surgery

## 2013-03-01 VITALS — BP 132/84 | HR 74 | Temp 98.7°F | Resp 18 | Ht 71.0 in | Wt 166.4 lb

## 2013-03-01 DIAGNOSIS — E213 Hyperparathyroidism, unspecified: Secondary | ICD-10-CM

## 2013-03-01 DIAGNOSIS — E215 Disorder of parathyroid gland, unspecified: Secondary | ICD-10-CM

## 2013-03-01 NOTE — Progress Notes (Signed)
Patient ID: [name], male   DOB: Nov 14, 1932, 79 y.o.   MRN: [id number]  Chief Complaint  Patient presents with  . Thyroid Problem    HPI [name] is a 79 y.o. male.  The patient is a 79 year old male who is referred from Dr. Imelda Pillow office for evaluation of hyperparathyroidism. The patient's undergone multiple kidney stone surgeries as well as had multiple occurrences of kidney stones. The patient's states that his calcium has been high for multiple years.  The patient has a history of having a pacemaker placed for which she sees Dr. Johney Frame.  The patient also takes Coumadin for a previous PE that is managed by Dr. Jeannetta Nap.   The patient also has a history of left-sided lung cancer status post resection status post radiation therapy as well as cryotherapy of prostate cancer. HPI  Past Medical History  Diagnosis Date  . Hyperlipidemia   . Hypertension   . Esophageal reflux   . Diverticulosis   . Rheumatic fever ~ 1944  . Vision loss of left eye     S/P "cataract OR"; "can see a little; not much"  . Kidney stones   . Hard of hearing, left   . Pulmonary embolism     On Xarelto  . Hypercalcemia   . Arthritis     "knees"  . CAD (coronary artery disease)     nonobstructive by cath 07/22/11 with small to moderate sized diagonal branch with ostial stenosis , medical therapy advised  . Prostate cancer     s/p cryotherapy  . AV block     s/p PPM  . COPD (chronic obstructive pulmonary disease)   . Carotid artery aneurysm     Bilateral internal carotid artery aneurysms followed by Dr. Arbie Cookey  . Shortness of breath     with ambulation  . Constipation   . Shingles 12/04/2012    right lateral anterior chest  . Radiation 10/7,10/9,01/03/2013    SBRT Left upper lobe lung    Past Surgical History  Procedure Laterality Date  . Cystoscopy    . Lung surgery  1957    "born w/bleb in [place]  . Insert / replace / remove pacemaker  1991    initial placement  . Insert /  replace / remove pacemaker  ~ 2011    "changed out"  . Tonsillectomy and adenoidectomy  1943  . Appendectomy  2000's  . Cholecystectomy  2000's  . Cataract extraction w/ intraocular lens implant  ~ 2008    left eye  . Lithotripsy  ~ 2011    "twice"  . Cardiac catheterization    . Prostate cryoablation  ~ 2008  . Video bronchoscopy  10/20/2011    Procedure: VIDEO BRONCHOSCOPY;  Surgeon: [name], MD;  Location: [facility] OR;  Service: Thoracic;  Laterality: N/A;  . Needle core biopsy  12/06/2012    Lung/lul    Family History  Problem Relation Age of Onset  . Hypertension Neg Hx   . Hyperlipidemia Neg Hx   . Heart failure Neg Hx   . Heart disease Neg Hx   . Colon cancer Neg Hx   . Breast cancer Mother   . Emphysema Sister     was a smoker  . Brain cancer Father 48     type unknown    Social History History  Substance Use Topics  . Smoking status: Former Smoker -- 1.00 packs/day for 50 years    Types: Cigarettes    Quit  date: 03/23/2004  . Smokeless tobacco: Current User    Types: Chew  . Alcohol Use: No    Allergies  Allergen Reactions  . Iohexol      Desc: PT STATED TODAY HE GETS SLIGHT SOB FROM IV CONTRAST. HE NEEDS FULL PREMEDS FROM NOW ON PER DR. MATTERN   . Penicillins Itching and Rash    "haven't took none in 40 years"    Current Outpatient Prescriptions  Medication Sig Dispense Refill  . ibuprofen (ADVIL) 200 MG tablet Take 200 mg by mouth as needed for pain.      Marland Kitchen losartan (COZAAR) 50 MG tablet Take 50 mg by mouth daily.      . mirtazapine (REMERON) 15 MG tablet Take 15 mg by mouth.       . nitroGLYCERIN (NITROSTAT) 0.4 MG SL tablet Place 1 tablet (0.4 mg total) under the tongue every 5 (five) minutes as needed. For chest pain  30 tablet  3  . warfarin (COUMADIN) 5 MG tablet Take 5 mg by mouth daily.      Marland Kitchen albuterol (PROVENTIL,VENTOLIN) 90 MCG/ACT inhaler Inhale 2 puffs into the lungs every 6 (six) hours as needed. For wheezing or shortness of breath        No current facility-administered medications for this visit.    Review of Systems Review of Systems  Constitutional: Negative.   HENT: Negative.   Eyes: Negative.   Respiratory: Negative.   Cardiovascular: Negative.   Gastrointestinal: Negative.   Endocrine: Negative.   Neurological: Negative.   All other systems reviewed and are negative.    Blood pressure 132/84, pulse 74, temperature 98.7 F (37.1 C), temperature source Temporal, resp. rate 18, height 5\' 11"  (1.803 m), weight 166 lb 6.4 oz (75.479 kg).  Physical Exam Physical Exam  Constitutional: He is oriented to person, place, and time. He appears well-developed and well-nourished.  HENT:  Head: Normocephalic and atraumatic.  Eyes: Conjunctivae and EOM are normal. Pupils are equal, round, and reactive to light.  Neck: Normal range of motion. Neck supple.  Cardiovascular: Normal rate, regular rhythm and normal heart sounds.   Pulmonary/Chest: Effort normal and breath sounds normal.  Abdominal: Soft. Bowel sounds are normal.  Musculoskeletal: Normal range of motion.  Neurological: He is alert and oriented to person, place, and time.  Skin: Skin is warm and dry.    Data Reviewed Parathyroid hormone: 118.4 Calcium 11.1  Assessment    79 year old male with hyperparathyroidism.     Plan    1. Will obtain a sestamibi scan to evaluate for possible adenoma. 2. Will obtain cardiac evaluation from Dr. Johney Frame. 3. We'll discuss current management with Dr. Jeannetta Nap prior to his surgery. 4. Once the sestamibi scan a cardiac evaluation has been obtained the patient come back and discuss surgical portion of the operation.        Marigene Ehlers., Adriane Gabbert 03/01/2013, 1:47 PM

## 2013-03-02 ENCOUNTER — Telehealth (INDEPENDENT_AMBULATORY_CARE_PROVIDER_SITE_OTHER): Payer: Self-pay | Admitting: *Deleted

## 2013-03-02 NOTE — Telephone Encounter (Signed)
I spoke with pts wife Gardiner Ramus and informed her of pts appt for the sestamibi scan at [facility] on 12/29 with an arrival time of 7:45am.  She is agreeable with this appt information.

## 2013-03-07 ENCOUNTER — Encounter (INDEPENDENT_AMBULATORY_CARE_PROVIDER_SITE_OTHER): Payer: Self-pay

## 2013-03-10 ENCOUNTER — Encounter: Payer: Self-pay | Admitting: Nurse Practitioner

## 2013-03-10 ENCOUNTER — Ambulatory Visit (INDEPENDENT_AMBULATORY_CARE_PROVIDER_SITE_OTHER): Payer: Medicare Other | Admitting: Nurse Practitioner

## 2013-03-10 VITALS — BP 150/82 | HR 71 | Ht 71.0 in | Wt 167.8 lb

## 2013-03-10 DIAGNOSIS — Z01818 Encounter for other preprocedural examination: Secondary | ICD-10-CM

## 2013-03-10 DIAGNOSIS — I2510 Atherosclerotic heart disease of native coronary artery without angina pectoris: Secondary | ICD-10-CM

## 2013-03-10 NOTE — Patient Instructions (Signed)
I think you are an acceptable candidate for your possible surgery  See Dr. Johney Frame in March  Call the [facility] office at [phone] if you have any questions, problems or concerns.

## 2013-03-10 NOTE — Progress Notes (Signed)
[name] Date of Birth: 1933-03-10 Medical Record #[id number]  History of Present Illness: [name] is seen back today for a pre op visit. Seen for Dr. Johney Frame. He has a history of CHB with past PPM implant, nonobstructive CAD with past cath in May of 2013 and COPD. Other issues include HLD, HTN, GRED, rheumatic fever, pulmonary embolism - on Xarelto which has been switched to Coumadin, prostate cancer, lung cancer with past resection and radiation therapy, bilateral carotid aneurysms - followed by Dr. Arbie Cookey and constipation.  Last seen here in March for device check. Was doing ok. Had a lone asymptomatic episode of NSVT but overall was felt to be doing well.  Comes back today. Here with his wife. For possible parathyroid surgery. He has had undergone multiple kidney stone surgeries as well as multiple occurences of kidney stones with a high calcium level for many years. For a nuclear scan later this month - no surgery actually planned yet and he says he is not sure if he is going to proceed. No chest pain. Has shortness of breath - this seems stable. Occasional palpitations but not dizzy or lightheaded. More concerned about a CT scan done earlier this month - says no one ever called him with the results. He is now on Coumadin - apparently did not tolerate Xarelto. Tells me he has had only one occurrence of PE.   Current Outpatient Prescriptions  Medication Sig Dispense Refill  . ibuprofen (ADVIL) 200 MG tablet Take 200 mg by mouth as needed for pain.      [name] losartan (COZAAR) 50 MG tablet Take 50 mg by mouth daily.      . mirtazapine (REMERON) 15 MG tablet Take 15 mg by mouth.       . nitroGLYCERIN (NITROSTAT) 0.4 MG SL tablet Place 1 tablet (0.4 mg total) under the tongue every 5 (five) minutes as needed. For chest pain  30 tablet  3  . warfarin (COUMADIN) 5 MG tablet Take 5 mg by mouth daily.      [name] albuterol (PROVENTIL,VENTOLIN) 90 MCG/ACT inhaler Inhale 2 puffs into the lungs every 6  (six) hours as needed. For wheezing or shortness of breath       No current facility-administered medications for this visit.    Allergies  Allergen Reactions  . Iohexol      Desc: PT STATED TODAY HE GETS SLIGHT SOB FROM IV CONTRAST. HE NEEDS FULL PREMEDS FROM NOW ON PER DR. MATTERN   . Penicillins Itching and Rash    "haven't took none in 40 years"    Past Medical History  Diagnosis Date  . Hyperlipidemia   . Hypertension   . Esophageal reflux   . Diverticulosis   . Rheumatic fever ~ 1944  . Vision loss of left eye     S/P "cataract OR"; "can see a little; not much"  . Kidney stones   . Hard of hearing, left   . Pulmonary embolism     On Xarelto  . Hypercalcemia   . Arthritis     "knees"  . CAD (coronary artery disease)     nonobstructive by cath 07/22/11 with small to moderate sized diagonal branch with ostial stenosis , medical therapy advised  . Prostate cancer     s/p cryotherapy  . AV block     s/p PPM  . COPD (chronic obstructive pulmonary disease)   . Carotid artery aneurysm     Bilateral internal carotid artery aneurysms followed by  Dr. Arbie Cookey  . Shortness of breath     with ambulation  . Constipation   . Shingles 12/04/2012    right lateral anterior chest  . Radiation 10/7,10/9,01/03/2013    SBRT Left upper lobe lung    Past Surgical History  Procedure Laterality Date  . Cystoscopy    . Lung surgery  1957    "born w/bleb in [place]  . Insert / replace / remove pacemaker  1991    initial placement  . Insert / replace / remove pacemaker  ~ 2011    "changed out"  . Tonsillectomy and adenoidectomy  1943  . Appendectomy  2000's  . Cholecystectomy  2000's  . Cataract extraction w/ intraocular lens implant  ~ 2008    left eye  . Lithotripsy  ~ 2011    "twice"  . Cardiac catheterization    . Prostate cryoablation  ~ 2008  . Video bronchoscopy  10/20/2011    Procedure: VIDEO BRONCHOSCOPY;  Surgeon: [name], MD;  Location: [facility] OR;  Service: Thoracic;   Laterality: N/A;  . Needle core biopsy  12/06/2012    Lung/lul    History  Smoking status  . Former Smoker -- 1.00 packs/day for 50 years  . Types: Cigarettes  . Quit date: 03/23/2004  Smokeless tobacco  . Current User  . Types: Chew    History  Alcohol Use No    Family History  Problem Relation Age of Onset  . Hypertension Neg Hx   . Hyperlipidemia Neg Hx   . Heart failure Neg Hx   . Heart disease Neg Hx   . Colon cancer Neg Hx   . Breast cancer Mother   . Emphysema Sister     was a smoker  . Brain cancer Father 24     type unknown    Review of Systems: The review of systems is per the HPI.  All other systems were reviewed and are negative.  Physical Exam: BP 150/82  Pulse 71  Ht 5\' 11"  (1.803 m)  Wt 167 lb 12.8 oz (76.114 kg)  BMI 23.41 kg/m2 Patient is very pleasant and in no acute distress. Skin is warm and dry. Color is normal.  HEENT is unremarkable. Normocephalic/atraumatic. PERRL. Sclera are nonicteric. Neck is supple. No masses. No JVD. Lungs are coarse. Cardiac exam shows a regular rate and rhythm. Abdomen is soft. Extremities are without edema. Gait and ROM are intact. No gross neurologic deficits noted.  LABORATORY DATA: EKG shows sinus with 1st degree AV block  Lab Results  Component Value Date   WBC 10.4 12/06/2012   HGB 17.5* 12/06/2012   HCT 47.8 12/06/2012   PLT 272 12/06/2012   GLUCOSE 95 11/13/2012   CHOL 192 07/22/2011   TRIG 76 07/22/2011   HDL 57 07/22/2011   LDLCALC 621* 07/22/2011   ALT 11 10/22/2011   AST 19 10/22/2011   NA 138 11/13/2012   K 3.6 11/13/2012   CL 103 11/13/2012   CREATININE 0.99 11/13/2012   BUN 13 11/13/2012   CO2 25 11/13/2012   TSH 4.001 06/13/2011   INR 1.02 12/06/2012   HGBA1C 5.0 06/13/2011    Assessment / Plan: 1. Underlying PPM - for remote check next month. To see Dr. Johney Frame in March.   2. Pre op clearance - possible parathyroid surgery - should be an acceptable candidate - currently with stable dyspnea and no chest pain  - will be available as needed if problems arise.  3. Lung  cancer - followed by Radiation oncology and Dr. Tyrone Sage   4. HTN  5. Pulmonary embolism - diagnosed several years ago - says he has only had one occurrence - now on coumadin due to intolerance to Xarelto - he will discuss long term usage on his return visit. For now, I have continued.   Patient is agreeable to this plan and will call if any problems develop in the interim.   [name] Macadamia, RN, ANP-C [facility] [address] [place], [place]  [address]

## 2013-03-13 ENCOUNTER — Encounter (INDEPENDENT_AMBULATORY_CARE_PROVIDER_SITE_OTHER): Payer: Self-pay

## 2013-03-13 ENCOUNTER — Telehealth: Payer: Self-pay

## 2013-03-13 NOTE — Telephone Encounter (Signed)
Patient informed of ct chest results were good to follow up with Dr.Wentworth in June 2015.Aware of ct chest scheduled for 08/28/13 and follow up with Dr.Gerhardt afterward.

## 2013-03-20 ENCOUNTER — Encounter (HOSPITAL_COMMUNITY)
Admission: RE | Admit: 2013-03-20 | Discharge: 2013-03-20 | Disposition: A | Discharge status: Home / Self Care | Payer: Medicare Other | Source: Ambulatory Visit | Attending: General Surgery | Admitting: General Surgery

## 2013-03-20 ENCOUNTER — Encounter (HOSPITAL_COMMUNITY): Payer: Medicare Other

## 2013-03-20 DIAGNOSIS — E213 Hyperparathyroidism, unspecified: Secondary | ICD-10-CM | POA: Insufficient documentation

## 2013-03-20 DIAGNOSIS — E215 Disorder of parathyroid gland, unspecified: Secondary | ICD-10-CM

## 2013-03-20 MED ORDER — TECHNETIUM TC 99M SESTAMIBI GENERIC - CARDIOLITE
26.4000 | Freq: Once | INTRAVENOUS | Status: AC | PRN
  Administered 2013-03-20: 26.4 via INTRAVENOUS

## 2013-03-21 ENCOUNTER — Telehealth (INDEPENDENT_AMBULATORY_CARE_PROVIDER_SITE_OTHER): Payer: Self-pay | Admitting: *Deleted

## 2013-03-21 ENCOUNTER — Other Ambulatory Visit (INDEPENDENT_AMBULATORY_CARE_PROVIDER_SITE_OTHER): Payer: Self-pay

## 2013-03-21 DIAGNOSIS — E215 Disorder of parathyroid gland, unspecified: Secondary | ICD-10-CM

## 2013-03-21 NOTE — Telephone Encounter (Signed)
I spoke with pt and informed him of his appt for [place] at [facility] on 03/24/13 with an arrival time of 1:00pm.  He is agreeable with this appt.

## 2013-03-22 ENCOUNTER — Other Ambulatory Visit: Payer: Medicare Other

## 2013-03-22 ENCOUNTER — Telehealth (INDEPENDENT_AMBULATORY_CARE_PROVIDER_SITE_OTHER): Payer: Self-pay

## 2013-03-22 NOTE — Telephone Encounter (Signed)
Called and spoke to patient to make aware of his sestamibi scan results.  Patient aware that an abnormality (adenoma) was seen on his scan and an Dr. Derrell Lolling recommends an [place] for furrther evaluation.  Patient verbalized understanding.

## 2013-03-22 NOTE — Telephone Encounter (Signed)
Message copied by Maryan Puls on Wed Mar 22, 2013  9:58 AM ------      Message from: Fatima Sanger      Created: Tue Mar 21, 2013  4:06 PM       Pts wife called in wanting to know what the results of his sestamibi scan was.  Please call her with those results.              Thank you,      Stevie ------

## 2013-03-24 ENCOUNTER — Ambulatory Visit
Admission: RE | Admit: 2013-03-24 | Discharge: 2013-03-24 | Disposition: A | Discharge status: Home / Self Care | Payer: Medicare Other | Source: Ambulatory Visit | Attending: General Surgery | Admitting: General Surgery

## 2013-03-24 DIAGNOSIS — E215 Disorder of parathyroid gland, unspecified: Secondary | ICD-10-CM

## 2013-03-30 ENCOUNTER — Telehealth: Payer: Self-pay | Admitting: Internal Medicine

## 2013-03-30 ENCOUNTER — Ambulatory Visit (INDEPENDENT_AMBULATORY_CARE_PROVIDER_SITE_OTHER): Payer: Medicare Other | Admitting: *Deleted

## 2013-03-30 ENCOUNTER — Encounter: Payer: Self-pay | Admitting: Internal Medicine

## 2013-03-30 DIAGNOSIS — I441 Atrioventricular block, second degree: Secondary | ICD-10-CM

## 2013-03-30 DIAGNOSIS — I442 Atrioventricular block, complete: Secondary | ICD-10-CM

## 2013-03-30 LAB — MDC_IDC_ENUM_SESS_TYPE_REMOTE
Battery Remaining Longevity: 79 mo
Brady Statistic AP VP Percent: 1 %
Brady Statistic AP VS Percent: 1.4 %
Brady Statistic RA Percent Paced: 1 %
Brady Statistic RV Percent Paced: 1 %
Date Time Interrogation Session: 20150108081449
Implantable Pulse Generator Model: 2210
Implantable Pulse Generator Serial Number: 7167612
Lead Channel Impedance Value: 340 Ohm
Lead Channel Impedance Value: 440 Ohm
Lead Channel Sensing Intrinsic Amplitude: 8.1 mV
Lead Channel Setting Pacing Amplitude: 3 V
Lead Channel Setting Pacing Pulse Width: 1 ms
Lead Channel Setting Sensing Sensitivity: 2 mV
MDC IDC MSMT BATTERY VOLTAGE: 2.96 V
MDC IDC MSMT LEADCHNL RA SENSING INTR AMPL: 3.7 mV
MDC IDC SET LEADCHNL RA PACING AMPLITUDE: 2.5 V
MDC IDC STAT BRADY AS VP PERCENT: 1 %
MDC IDC STAT BRADY AS VS PERCENT: 98 %

## 2013-03-30 NOTE — Telephone Encounter (Signed)
Transmission received, patient aware. 

## 2013-03-30 NOTE — Telephone Encounter (Signed)
New Message  Pt called asking if the Signal was received from their remote check.. Were having difficulties with sending it// Please call

## 2013-03-31 NOTE — Progress Notes (Signed)
Cardiac clearance

## 2013-04-04 ENCOUNTER — Encounter: Payer: Self-pay | Admitting: *Deleted

## 2013-05-25 ENCOUNTER — Other Ambulatory Visit: Payer: Self-pay | Admitting: *Deleted

## 2013-05-25 DIAGNOSIS — C3410 Malignant neoplasm of upper lobe, unspecified bronchus or lung: Secondary | ICD-10-CM

## 2013-06-01 ENCOUNTER — Encounter: Payer: Medicare Other | Admitting: Internal Medicine

## 2013-06-16 ENCOUNTER — Ambulatory Visit: Payer: Medicare Other | Admitting: Cardiothoracic Surgery

## 2013-06-16 ENCOUNTER — Other Ambulatory Visit: Payer: Self-pay | Admitting: *Deleted

## 2013-06-16 ENCOUNTER — Ambulatory Visit
Admission: RE | Admit: 2013-06-16 | Discharge: 2013-06-16 | Disposition: A | Discharge status: Home / Self Care | Payer: Medicare Other | Source: Ambulatory Visit | Attending: Cardiothoracic Surgery | Admitting: Cardiothoracic Surgery

## 2013-06-16 DIAGNOSIS — C3410 Malignant neoplasm of upper lobe, unspecified bronchus or lung: Secondary | ICD-10-CM

## 2013-06-16 NOTE — Telephone Encounter (Signed)
Erroneous encounter

## 2013-06-19 ENCOUNTER — Encounter: Payer: Self-pay | Admitting: Cardiothoracic Surgery

## 2013-06-19 ENCOUNTER — Ambulatory Visit (INDEPENDENT_AMBULATORY_CARE_PROVIDER_SITE_OTHER): Payer: Medicare Other | Admitting: Cardiothoracic Surgery

## 2013-06-19 VITALS — BP 120/80 | HR 100 | Resp 20 | Ht 71.0 in | Wt 165.0 lb

## 2013-06-19 DIAGNOSIS — C3410 Malignant neoplasm of upper lobe, unspecified bronchus or lung: Secondary | ICD-10-CM

## 2013-06-19 NOTE — Progress Notes (Signed)
[name]       [name] [address]             [phone]                               [name] Medical Record #[id number] Date of Birth: Dec 17, 1932  [name], * [name], MD  Chief Complaint:   PostOp Follow Up Visit 10/20/2011  DATE OF DISCHARGE:  OPERATIVE REPORT  Preop: Non small cell Lung cancer- left  Postop: same  SURGICAL PROCEDURE: Repeat left Mini thoracotomy, wedge resection, and  segmentectomy of the superior segment of the left lower lobe, and  placement of On-Q device.    pT1b, pNX, MX. INVASIVE MODERATELY DIFFERENTIATED ADENOCARCINOMA, SPANNING 2.2 CM IN GREATEST DIMENSION.   Wedge resection 10/20/2011  Stereotactic radio therapy to left upper lobe lesion in October 2014  Needle BX of left upper lobe lesion: [id number]  ALK negative, EGFR  positive Lung, needle/core biopsy(ies), LUL - POSITIVE FOR ADENOCARCINOMA.  History of Present Illness:     Patient returns to the office today for regularly scheduled followup after surgical wedge resection of a T1 B. moderately differentiated adenocarcinoma the lung 2 cm in size located in the superior segment of the left lower lobe 10/20/2011. The patient previously had full left thoracotomy many years ago for "cyst".   An enlarging lesion in the left upper lobe was biopsy proven adenocarcinoma and he underwent stereotactic radiotherapy in the fall of 2014  Patient returns today with a followup CT scan following surgical resection of a left lower lobe lung adenocarcinoma in July of 2013 and subsequent stereotactic radiotherapy for a second primary in the left upper lobe. Recently he has been at [facility] for: Because of the severe illness of his wife.  History  Smoking status  . Former Smoker -- 1.00 packs/day for 50 years  . Types: Cigarettes  . Quit date: 03/23/2004  Smokeless tobacco  . Current User  . Types: Chew        Allergies  Allergen Reactions  . Iohexol      Desc: PT STATED TODAY HE GETS SLIGHT SOB FROM IV CONTRAST. HE NEEDS FULL PREMEDS FROM NOW ON PER DR. MATTERN   . Penicillins Itching and Rash    "haven't took none in 40 years"    Current Outpatient Prescriptions  Medication Sig Dispense Refill  . albuterol (PROVENTIL,VENTOLIN) 90 MCG/ACT inhaler Inhale 2 puffs into the lungs every 6 (six) hours as needed for shortness of breath. For wheezing or shortness of breath      . ibuprofen (ADVIL) 200 MG tablet Take 200 mg by mouth as needed for pain.      Marland Kitchen losartan (COZAAR) 50 MG tablet Take 50 mg by mouth daily.      . mirtazapine (REMERON) 15 MG tablet Take 15 mg by mouth.       . nitroGLYCERIN (NITROSTAT) 0.4 MG SL tablet Place 1 tablet (0.4 mg total) under the tongue every 5 (five) minutes as needed. For chest pain  30 tablet  3  . warfarin (COUMADIN) 5 MG tablet Take 5 mg by mouth daily.       No current facility-administered medications for this visit.       Physical Exam: BP 120/80  Pulse 100  Resp 20  Ht $R'5\' 11"'zK$  (1.803 m)  Wt 165 lb (74.844  kg)  BMI 23.02 kg/m2  SpO2 95% Patient in sinus rhythm on monitor today General appearance: alert and cooperative Neurologic: intact Heart: regular rate and rhythm, S1, S2 normal, no murmur, click, rub or gallop and normal apical impulse Lungs: clear to auscultation bilaterally and normal percussion bilaterally Abdomen: soft, non-tender; bowel sounds normal; no masses,  no organomegaly Extremities: extremities normal, atraumatic, no cyanosis or edema and Homans sign is negative, no sign of DVT Wounds:well healed.  No adenopathy  Diagnostic Studies & Laboratory data:         Recent Radiology Findings:  Ct Chest Wo Contrast  06/16/2013   CLINICAL DATA:  Lung cancer. Prior left upper lobe resection. Right lung cancer with radiation. History of prostate cancer.  EXAM: CT CHEST WITHOUT CONTRAST  TECHNIQUE: Multidetector CT imaging of  the chest was performed following the standard protocol without IV contrast.  COMPARISON:  02/27/2013  FINDINGS: Severe emphysematous changes in the lungs. Postsurgical changes in the posterior superior left upper lobe. 12 mm nodular area in the left apex, stable. Biapical scarring. Right lower lobe nodule on image 42 measures 8 mm, stable. Subpleural densities noted in the left lateral hemithorax are unchanged. No pleural effusions. No new or enlarging pulmonary nodules. Small nodule in the left lower lobe on image 36 measures 5 mm, stable.  Pacer is noted in place with leads in the right atrium and right ventricle, stable. Heart is normal size. Ascending aortic borderline in diameter at 3.9 cm. Small scattered mediastinal lymph nodes, none pathologically enlarged or changed since prior study. Chest wall soft tissues are unremarkable.  Large subcutaneous low-density lesion is noted posteriorly in the right posterior shoulder measuring approximately 6.2 cm, stable since PET CT. This presumably represents a large sebaceous cyst.  IMPRESSION: Stable postoperative changes in the posterior superior left upper lobe. Stable bilateral pulmonary nodules. No new or enlarging pulmonary nodules.  Mild slight dilatation of the ascending thoracic aorta measuring 4.0 cm.   Electronically Signed   By: Rolm Baptise M.D.   On: 06/16/2013 13:11  Ct Angio Head W/cm &/or Wo Cm  11/13/2012   *RADIOLOGY REPORT*  Clinical Data:  Numbness all over.  History of bilateral ICA pseudoaneurysms.  Headache.  THE PATIENT WAS PREMEDICATED WITH IV STEROIDS BECAUSE OF THE HISTORY OF CONTRAST ALLERGY.  THERE WERE NO ADVERSE EVENTS.  CT ANGIOGRAPHY HEAD AND NECK  Technique:  Multidetector CT imaging of the head and neck was performed using the standard protocol during bolus administration of intravenous contrast.  Multiplanar CT image reconstructions including MIPs were obtained to evaluate the vascular anatomy. Carotid stenosis measurements (when  applicable) are obtained utilizing NASCET criteria, using the distal internal carotid diameter as the denominator.  Contrast: 9mL OMNIPAQUE IOHEXOL 350 MG/ML SOLN  Comparison:  08/25/2011 CT angio neck.  10/03/2008 CT angio head.  CTA NECK  Findings:  Right chest cardiac pacemaker.  COPD.  Spiculated left upper lobe lesion is new from priors measuring 11 x 15 mm.  In the setting of chronic of tobacco abuse, concern raised for pulmonary neoplasm.  Previously identified and biopsied, spiculated lesion superior segment left lower lobe lesion, not well seen on today's examination.  Stable subcentimeter thyroid nodules.  Negative larynx.  Advanced spondylosis.  Three-vessel arch configuration without proximal stenosis.  Unremarkable right common carotid artery.  Mild nonstenotic posterior wall plaque.  Tortuous cervical right internal carotid artery with a fusiform upper cervical ICA pseudoaneurysm measuring 8 x 8 x 9 mm (image 119 series 6)  is stable from priors.  No stenosis.  No significant left or right vertebral artery pathology.  Left vertebral dominant.  Unremarkable left common carotid artery.  Posterior wall plaque is non stenotic in the proximal left internal carotid artery. Rim calcified predominately thrombosed 7 x 7 x 8 mm more inferior cervical ICA aneurysm projecting medially is unchanged.  More superiorly, a  partially thrombosed and calcified skull base cervical ICA aneurysm measuring 8 x 10 x 11 mm is also stable. Lateral thrombus extends cephalad to the level of the horizontal petrous/foramen lacerum level.  No ICA dissection.   Review of the MIP images confirms the above findings.  IMPRESSION:  Stable extracranial atherosclerotic change and stable bilateral cervical ICA pseudoaneurysms.  No evidence for enlargement, the dissection, or neck hemorrhage.  New spiculated left upper lobe 11 x 15 mm lesion; in the setting of COPD cannot exclude squamous cell carcinoma. As the patient has been premedicated,  CT chest will be performed shortly with an additional 50 ml of contrast for expeditious  evaluation of the chest and mediastinum given his contrast allergy. Findings discussed with EDP.  CTA HEAD  Findings:  There is no evidence for acute infarction, intracranial hemorrhage, mass lesion, hydrocephalus, or extra-axial fluid.  Mild atrophy.  Mild white matter disease.  Post infusion, no abnormal enhancement.  Intact calvarium.  The  intracranial vasculature is dolichoectatic but patent.  No intracranial dissection or berry aneurysm is present.  The left vertebral is dominant.  There is no proximal vascular occlusion or significant flow reducing lesion. Similar appearance to priors.   Review of the MIP images confirms the above findings.  IMPRESSION: Unremarkable CTA head.   Original Report Authenticated By: Rolla Flatten, M.D.   Ct Angio Neck W/cm &/or Wo/cm  11/13/2012   *RADIOLOGY REPORT*  Clinical Data:  Numbness all over.  History of bilateral ICA pseudoaneurysms.  Headache.  THE PATIENT WAS PREMEDICATED WITH IV STEROIDS BECAUSE OF THE HISTORY OF CONTRAST ALLERGY.  THERE WERE NO ADVERSE EVENTS.  CT ANGIOGRAPHY HEAD AND NECK  Technique:  Multidetector CT imaging of the head and neck was performed using the standard protocol during bolus administration of intravenous contrast.  Multiplanar CT image reconstructions including MIPs were obtained to evaluate the vascular anatomy. Carotid stenosis measurements (when applicable) are obtained utilizing NASCET criteria, using the distal internal carotid diameter as the denominator.  Contrast: 55mL OMNIPAQUE IOHEXOL 350 MG/ML SOLN  Comparison:  08/25/2011 CT angio neck.  10/03/2008 CT angio head.  CTA NECK  Findings:  Right chest cardiac pacemaker.  COPD.  Spiculated left upper lobe lesion is new from priors measuring 11 x 15 mm.  In the setting of chronic of tobacco abuse, concern raised for pulmonary neoplasm.  Previously identified and biopsied, spiculated lesion  superior segment left lower lobe lesion, not well seen on today's examination.  Stable subcentimeter thyroid nodules.  Negative larynx.  Advanced spondylosis.  Three-vessel arch configuration without proximal stenosis.  Unremarkable right common carotid artery.  Mild nonstenotic posterior wall plaque.  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  No stenosis.  No significant left or right vertebral artery pathology.  Left vertebral dominant.  Unremarkable left common carotid artery.  Posterior wall plaque is non stenotic in the proximal left internal carotid artery. Rim calcified predominately thrombosed 7 x 7 x 8 mm more inferior cervical ICA aneurysm projecting medially  is unchanged.  More superiorly, a  partially thrombosed and calcified skull base cervical ICA aneurysm measuring 8 x 10 x 11 mm is also stable. Lateral thrombus extends cephalad to the level of the horizontal petrous/foramen lacerum level.  No ICA dissection.   Review of the MIP images confirms the above findings.  IMPRESSION:  Stable extracranial atherosclerotic change and stable bilateral cervical ICA pseudoaneurysms.  No evidence for enlargement, the dissection, or neck hemorrhage.  New spiculated left upper lobe 11 x 15 mm lesion; in the setting of COPD cannot exclude squamous cell carcinoma. As the patient has been premedicated, CT chest will be performed shortly with an additional 50 ml of contrast for expeditious  evaluation of the chest and mediastinum given his contrast allergy. Findings discussed with EDP.  CTA HEAD  Findings:  There is no evidence for acute infarction, intracranial hemorrhage, mass lesion, hydrocephalus, or extra-axial fluid.  Mild atrophy.  Mild white matter disease.  Post infusion, no abnormal enhancement.  Intact calvarium.  The  intracranial vasculature is dolichoectatic but patent.  No  intracranial dissection or berry aneurysm is present.  The left vertebral is dominant.  There is no proximal vascular occlusion or significant flow reducing lesion. Similar appearance to priors.   Review of the MIP images confirms the above findings.  IMPRESSION: Unremarkable CTA head.   Original Report Authenticated By: Rolla Flatten, M.D.   Ct Chest W Contrast  11/22/2012   *RADIOLOGY REPORT*  Clinical Data: Shortness of breath, weakness, abnormality on recently performed neck CTA.  CT CHEST WITH CONTRAST  Technique:  Multidetector CT imaging of the chest was performed following the standard protocol during bolus administration of intravenous contrast.  Contrast: 53mL OMNIPAQUE IOHEXOL 300 MG/ML  SOLN  Comparison: Neck CT head - earlier same day; chest CT - 07/14/2012; 09/03/2011  Findings:  Stable postsurgical change of the superior segment of the left lower lobe.  While the external diameter of the previous identified approximately 1.3 x 1.0 cm nodule within the left lung apex (image 13, series 3) is grossly unchanged, the nodule appears more consolidated on the present examination, with slight differences possibly attributable to slice selection.  Additional approximately 8 mm ground-glass nodule within the right lower lobe (image 43, series 3) appears grossly unchanged, as does an approximately 5 mm ground-glass nodule within the superior segment of the lingula (image 34, series 3).  No new discrete pulmonary nodules.  Scattered shoddy mediastinal lymph nodes appear grossly unchanged and are individually not enlarged by size criteria with index pretracheal node measuring 9 mm in short axis diameter (image 23, series 2). No definite hilar or axillary lymphadenopathy.  Advanced centrilobular emphysematous change.  Minimal dependent subsegmental atelectasis.  No focal airspace opacity.  No pleural effusion or pneumothorax.  The central pulmonary airways are patent.  Normal heart size.  Right anterior chest wall  dual lead pacemaker. No pericardial effusion.  Normal caliber of the main pulmonary artery. Grossly unchanged mild ectasia of the ascending thoracic aorta measuring approximately 4.1 cm in greatest oblique short axis diameter (image 30, series 2).  Conventional configuration of the aortic arch.  Scattered minimal atherosclerotic plaque within the thoracic aorta.  No definite thoracic aortic dissection or periaortic stranding.  Limited evaluation of the upper abdomen demonstrates a partially exophytic approximately 3.0 cm cyst arising from the posterior superior aspect of the right kidney.  Several additional smaller renal cysts are noted bilaterally.  Post cholecystectomy.  No acute or aggressive osseous abnormalities.  IMPRESSION:  1.  No change to minimal increase in the indeterminate approximately 1.3 cm nodule within the left lung apex, with slight differences in possibly attributable to slice selection.  While possibly representing of scar, given advanced emphysematous change, a slowly growing lung cancer is not excluded.  As such, further evaluation with a follow-up chest CT in 3 months is recommended.  2.  Grossly unchanged bilateral sub centimeter ground-glass nodules, likely too small to accurately characterize.  Continued attention on follow-up is recommended.  3.  Stable postsurgical change of the superior segment of the left lower lobe.  4.  Grossly unchanged mild fusiform ectasia of the descending thoracic aorta measuring approximately 41 mm in diameter.  Above findings discussed with Dr. Zenia Resides at the time of examination completion.   Original Report Authenticated By: Jake Seats, MD   Ct Chest Wo Contrast  07/14/2012  *RADIOLOGY REPORT*  Clinical Data: Follow-up lung cancer.  CT CHEST WITHOUT CONTRAST  Technique:  Multidetector CT imaging of the chest was performed following the standard protocol without IV contrast.  Comparison: CT scan 08/18/2011.  Findings: The chest wall is stable.  A permanent  right-sided pacemaker is noted.  No supraclavicular or axillary lymphadenopathy.  The bony thorax is intact.  No destructive bone lesions or spinal canal compromise.  Moderate degenerative changes involving the spine.  The heart is normal in size.  No pericardial effusion.  No mediastinal or hilar lymphadenopathy.  Stable tortuosity and ectasia of the thoracic aorta.  The esophagus is grossly normal.  Examination of the lung parenchyma demonstrates stable advanced emphysematous changes and pulmonary interstitial scarring.  The left upper lobe pulmonary nodule has been excised.  No findings for recurrent or residual tumor.  There is a new left apical lung density.  This could reflect progressive scarring change but does need observation.  There is a 7 mm nodule at the right lung base.  This is difficult to see on the prior CT but was a on the prior PET CT and needs continued observation.  There is an ill-defined nodular density measuring approximate 5 mm in the superior segment of the left lower lobe on image number 31. I believe this was present on the prior CT scan although it is more obvious now.  A 4 mm nodule is noted on image number 22 in the left upper lobe. This was present on the prior study and has not significantly changed.  No acute pulmonary findings or pleural effusion.  The upper abdomen is unremarkable.  Stable right renal cyst.  IMPRESSION:  1.  Surgical changes from prior excision of a left apical lung neoplasm. 2.  Advanced emphysematous changes and pulmonary scarring. 3.  New 13 x 8 mm left apical lung density.  This could reflect scarring change but needs close observation. 4.  Other small pulmonary nodules as discussed above.  Recommend continued observation. 5.  No acute pulmonary findings and no mediastinal or hilar lymphadenopathy. 6.  Stable tortuosity and ectasia of the thoracic aorta.   Original Report Authenticated By: Marijo Sanes, M.D.       Recent Labs: Lab Results  Component  Value Date   WBC 10.4 12/06/2012   HGB 17.5* 12/06/2012   HCT 47.8 12/06/2012   PLT 272 12/06/2012   GLUCOSE 95 11/13/2012   CHOL 192 07/22/2011   TRIG 76 07/22/2011   HDL 57 07/22/2011   LDLCALC 120* 07/22/2011   ALT 11 10/22/2011   AST 19 10/22/2011   NA 138 11/13/2012  K 3.6 11/13/2012   CL 103 11/13/2012   CREATININE 0.99 11/13/2012   BUN 13 11/13/2012   CO2 25 11/13/2012   TSH 4.001 06/13/2011   INR 1.02 12/06/2012   HGBA1C 5.0 06/13/2011      Assessment / Plan:    Wedge resection of Stage I INVASIVE MODERATELY DIFFERENTIATED ADENOCARCINOMA, SPANNING 2.2 CM IN GREATEST DIMENSION superior segment left lower lobe  Followup CT scan is reviewed with the patient, patient has diffuse scarring throughout the lungs which has been proven to be another primary lung cancer  By fine needle biopsy EGFR positive.  The patient underwent stereotactic radiotherapy to the left upper lobe lesion.  Currently the CT scan shows this lesion to have decreased slightly in size and otherwise he has a stable CT scan without any other enlarging nodules. We'll plan to see him back in 4 months with a followup CT scan.   Endre Coutts B 06/19/2013 3:19 PM

## 2013-08-24 ENCOUNTER — Ambulatory Visit (INDEPENDENT_AMBULATORY_CARE_PROVIDER_SITE_OTHER): Payer: Medicare Other | Admitting: *Deleted

## 2013-08-24 DIAGNOSIS — I442 Atrioventricular block, complete: Secondary | ICD-10-CM

## 2013-08-24 DIAGNOSIS — Z950 Presence of cardiac pacemaker: Secondary | ICD-10-CM

## 2013-08-28 ENCOUNTER — Encounter (HOSPITAL_COMMUNITY): Payer: Self-pay

## 2013-08-28 ENCOUNTER — Ambulatory Visit (HOSPITAL_COMMUNITY)
Admission: RE | Admit: 2013-08-28 | Discharge: 2013-08-28 | Disposition: A | Discharge status: Home / Self Care | Payer: Medicare Other | Source: Ambulatory Visit | Attending: Radiation Oncology | Admitting: Radiation Oncology

## 2013-08-28 DIAGNOSIS — C3410 Malignant neoplasm of upper lobe, unspecified bronchus or lung: Secondary | ICD-10-CM | POA: Insufficient documentation

## 2013-08-28 HISTORY — DX: Malignant neoplasm of unspecified part of unspecified bronchus or lung: C34.90

## 2013-08-28 LAB — MDC_IDC_ENUM_SESS_TYPE_REMOTE
Battery Voltage: 2.93 V
Brady Statistic AP VP Percent: 1 %
Brady Statistic AS VP Percent: 1 %
Brady Statistic RA Percent Paced: 1 %
Brady Statistic RV Percent Paced: 1 %
Date Time Interrogation Session: 20150604071044
Implantable Pulse Generator Model: 2210
Implantable Pulse Generator Serial Number: 7167612
Lead Channel Impedance Value: 300 Ohm
Lead Channel Pacing Threshold Amplitude: 1.5 V
Lead Channel Pacing Threshold Pulse Width: 1 ms
Lead Channel Sensing Intrinsic Amplitude: 3.5 mV
Lead Channel Setting Sensing Sensitivity: 2 mV
MDC IDC MSMT BATTERY REMAINING LONGEVITY: 64 mo
MDC IDC MSMT BATTERY REMAINING PERCENTAGE: 67 %
MDC IDC MSMT LEADCHNL RA IMPEDANCE VALUE: 430 Ohm
MDC IDC MSMT LEADCHNL RV PACING THRESHOLD AMPLITUDE: 1.75 V
MDC IDC MSMT LEADCHNL RV PACING THRESHOLD PULSEWIDTH: 1 ms
MDC IDC MSMT LEADCHNL RV SENSING INTR AMPL: 5.9 mV
MDC IDC SET LEADCHNL RA PACING AMPLITUDE: 2.5 V
MDC IDC SET LEADCHNL RV PACING AMPLITUDE: 3 V
MDC IDC SET LEADCHNL RV PACING PULSEWIDTH: 1 ms
MDC IDC STAT BRADY AP VS PERCENT: 1.7 %
MDC IDC STAT BRADY AS VS PERCENT: 97 %

## 2013-08-31 ENCOUNTER — Encounter: Payer: Self-pay | Admitting: Cardiothoracic Surgery

## 2013-08-31 ENCOUNTER — Ambulatory Visit (INDEPENDENT_AMBULATORY_CARE_PROVIDER_SITE_OTHER): Payer: Medicare Other | Admitting: Cardiothoracic Surgery

## 2013-08-31 ENCOUNTER — Telehealth: Payer: Self-pay | Admitting: *Deleted

## 2013-08-31 ENCOUNTER — Ambulatory Visit: Admission: RE | Admit: 2013-08-31 | Payer: Medicare Other | Source: Ambulatory Visit | Admitting: Radiation Oncology

## 2013-08-31 VITALS — BP 133/80 | HR 105 | Resp 16 | Ht 71.0 in | Wt 165.0 lb

## 2013-08-31 DIAGNOSIS — Z09 Encounter for follow-up examination after completed treatment for conditions other than malignant neoplasm: Secondary | ICD-10-CM

## 2013-08-31 DIAGNOSIS — C3430 Malignant neoplasm of lower lobe, unspecified bronchus or lung: Secondary | ICD-10-CM

## 2013-08-31 NOTE — Progress Notes (Signed)
[address]       [place] [address]             [phone]                         Chael C [facility] Medical Record #[id number] Date of Birth: 04/14/1932  [name], * [name], MD  Chief Complaint:   PostOp Follow Up Visit 10/20/2011  DATE OF DISCHARGE:  OPERATIVE REPORT  Preop: Non small cell Lung cancer- left  Postop: same  SURGICAL PROCEDURE: Repeat left Mini thoracotomy, wedge resection, and  segmentectomy of the superior segment of the left lower lobe, and  placement of On-Q device.    pT1b, pNX, MX. INVASIVE MODERATELY DIFFERENTIATED ADENOCARCINOMA, SPANNING 2.2 CM IN GREATEST DIMENSION.   Wedge resection 10/20/2011  Stereotactic radio therapy to left upper lobe lesion in October 2014  Needle BX of left upper lobe lesion: [id number]  ALK negative, EGFR  positive Lung, needle/core biopsy(ies), LUL - POSITIVE FOR ADENOCARCINOMA.  History of Present Illness:     Patient returns to the office today for regularly scheduled followup after surgical wedge resection of a T1 B. moderately differentiated adenocarcinoma the lung 2 cm in size located in the superior segment of the left lower lobe 10/20/2011. The patient previously had full left thoracotomy many years ago for "cyst".   An separate  enlarging lesion in the left upper lobe was biopsy proven adenocarcinoma and he underwent stereotactic radiotherapy in the fall of 2014  Patient returns today with a followup CT scan following surgical resection of a left lower lobe lung adenocarcinoma in July of 2013 and subsequent stereotactic radiotherapy for a second primary in the left upper lobe.  Patient comes in office today for his routine followup CT scan after wedge resection of the lung and subsequent stereotactic radiotherapy. He has numerous somatic complaints. His [facility] has been [facility] time and more recently in  skilled nursing facility. The patient complains of significant fatigue and "being numb from head to toe". Has an appointment to see Dr. Arelia Sneddon tomorrow for evaluation of his complaints and previous history of fibromyalgia.   History  Smoking status  . Former Smoker -- 1.00 packs/day for 50 years  . Types: Cigarettes  . Quit date: 03/23/2004  Smokeless tobacco  . Current User  . Types: Chew       Allergies  Allergen Reactions  . Iohexol      Desc: PT STATED TODAY HE GETS SLIGHT SOB FROM IV CONTRAST. HE NEEDS FULL PREMEDS FROM NOW ON PER DR. MATTERN   . Penicillins Itching and Rash    "haven't took none in 40 years"    Current Outpatient Prescriptions  Medication Sig Dispense Refill  . ibuprofen (ADVIL) 200 MG tablet Take 200 mg by mouth as needed for pain.      Marland Kitchen losartan (COZAAR) 50 MG tablet Take 50 mg by mouth daily.      . mirtazapine (REMERON) 15 MG tablet Take 15 mg by mouth.       . nitroGLYCERIN (NITROSTAT) 0.4 MG SL tablet Place 1 tablet (0.4 mg total) under the tongue every 5 (five) minutes as needed. For chest pain  30 tablet  3  . warfarin (COUMADIN) 5 MG tablet Take 5 mg by mouth daily.      Marland Kitchen albuterol (PROVENTIL,VENTOLIN) 90 MCG/ACT inhaler Inhale 2  puffs into the lungs every 6 (six) hours as needed for shortness of breath. For wheezing or shortness of breath       No current facility-administered medications for this visit.       Physical Exam: BP 133/80  Pulse 105  Resp 16  Ht $R'5\' 11"'ck$  (1.803 m)  Wt 165 lb (74.844 kg)  BMI 23.02 kg/m2  SpO2 97% Patient in sinus rhythm on palpation today General appearance: alert and cooperative Neurologic: intact Heart: regular rate and rhythm, S1, S2 normal, no murmur, click, rub or gallop and normal apical impulse Lungs: clear to auscultation bilaterally and normal percussion bilaterally Abdomen: soft, non-tender; bowel sounds normal; no masses,  no organomegaly Extremities: extremities normal, atraumatic, no  cyanosis or edema and Homans sign is negative, no sign of DVT Wounds:well healed.  No adenopathy  Diagnostic Studies & Laboratory data:         Recent Radiology Findings: Ct Chest Wo Contrast  08/28/2013   CLINICAL DATA:  Followup lung cancer  EXAM: CT CHEST WITHOUT CONTRAST  TECHNIQUE: Multidetector CT imaging of the chest was performed following the standard protocol without IV contrast.  COMPARISON:  06/16/2013  FINDINGS: No pleural effusion identified. Moderate to advanced changes of centrilobular emphysema identified. Pulmonary nodule within the right lower lobe is stable measuring 8 mm, image 44/series 5. Pulmonary nodule within the left lower lobe is stable measuring 5 mm, image 36/series 5. Peripheral scar like densities within the left lower lobe appear stable, image 31/series 5. Stable appearance of left upper lobe posterior suture line.  The heart size appears normal. There is no pericardial effusion. No mediastinal or hilar adenopathy. Calcified atherosclerotic disease involves the thoracic aorta.  Incidental imaging through the upper abdomen is significant for bilateral renal cysts. The adrenal glands appear normal. Patient is status post cholecystectomy.  Review of the visualized bony structures is negative for aggressive lytic or sclerotic bone lesion.  IMPRESSION: 1. Stable CT of the chest. 2. No change in bilateral pulmonary nodules. 3. Atherosclerotic disease. 4. Emphysema.   Electronically Signed   By: Kerby Moors M.D.   On: 08/28/2013 14:27   Ct Chest Wo Contrast  06/16/2013   CLINICAL DATA:  Lung cancer. Prior left upper lobe resection. Right lung cancer with radiation. History of prostate cancer.  EXAM: CT CHEST WITHOUT CONTRAST  TECHNIQUE: Multidetector CT imaging of the chest was performed following the standard protocol without IV contrast.  COMPARISON:  02/27/2013  FINDINGS: Severe emphysematous changes in the lungs. Postsurgical changes in the posterior superior left upper  lobe. 12 mm nodular area in the left apex, stable. Biapical scarring. Right lower lobe nodule on image 42 measures 8 mm, stable. Subpleural densities noted in the left lateral hemithorax are unchanged. No pleural effusions. No new or enlarging pulmonary nodules. Small nodule in the left lower lobe on image 36 measures 5 mm, stable.  Pacer is noted in place with leads in the right atrium and right ventricle, stable. Heart is normal size. Ascending aortic borderline in diameter at 3.9 cm. Small scattered mediastinal lymph nodes, none pathologically enlarged or changed since prior study. Chest wall soft tissues are unremarkable.  Large subcutaneous low-density lesion is noted posteriorly in the right posterior shoulder measuring approximately 6.2 cm, stable since PET CT. This presumably represents a large sebaceous cyst.  IMPRESSION: Stable postoperative changes in the posterior superior left upper lobe. Stable bilateral pulmonary nodules. No new or enlarging pulmonary nodules.  Mild slight dilatation of the ascending thoracic  aorta measuring 4.0 cm.   Electronically Signed   By: Rolm Baptise M.D.   On: 06/16/2013 13:11  Ct Angio Head W/cm &/or Wo Cm  11/13/2012   *RADIOLOGY REPORT*  Clinical Data:  Numbness all over.  History of bilateral ICA pseudoaneurysms.  Headache.  THE PATIENT WAS PREMEDICATED WITH IV STEROIDS BECAUSE OF THE HISTORY OF CONTRAST ALLERGY.  THERE WERE NO ADVERSE EVENTS.  CT ANGIOGRAPHY HEAD AND NECK  Technique:  Multidetector CT imaging of the head and neck was performed using the standard protocol during bolus administration of intravenous contrast.  Multiplanar CT image reconstructions including MIPs were obtained to evaluate the vascular anatomy. Carotid stenosis measurements (when applicable) are obtained utilizing NASCET criteria, using the distal internal carotid diameter as the denominator.  Contrast: 16mL OMNIPAQUE IOHEXOL 350 MG/ML SOLN  Comparison:  08/25/2011 CT angio neck.  10/03/2008  CT angio head.  CTA NECK  Findings:  Right chest cardiac pacemaker.  COPD.  Spiculated left upper lobe lesion is new from priors measuring 11 x 15 mm.  In the setting of chronic of tobacco abuse, concern raised for pulmonary neoplasm.  Previously identified and biopsied, spiculated lesion superior segment left lower lobe lesion, not well seen on today's examination.  Stable subcentimeter thyroid nodules.  Negative larynx.  Advanced spondylosis.  Three-vessel arch configuration without proximal stenosis.  Unremarkable right common carotid artery.  Mild nonstenotic posterior wall plaque.  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  No stenosis.  No significant left or right vertebral artery pathology.  Left vertebral dominant.  Unremarkable left common carotid artery.  Posterior wall plaque is non stenotic in the proximal left internal carotid artery. Rim calcified predominately thrombosed 7 x 7 x 8 mm more inferior cervical ICA aneurysm projecting medially is unchanged.  More superiorly, a  partially thrombosed and calcified skull base cervical ICA aneurysm measuring 8 x 10 x 11 mm is also stable. Lateral thrombus extends cephalad to the level of the horizontal petrous/foramen lacerum level.  No ICA dissection.   Review of the MIP images confirms the above findings.  IMPRESSION:  Stable extracranial atherosclerotic change and stable bilateral cervical ICA pseudoaneurysms.  No evidence for enlargement, the dissection, or neck hemorrhage.  New spiculated left upper lobe 11 x 15 mm lesion; in the setting of COPD cannot exclude squamous cell carcinoma. As the patient has been premedicated, CT chest will be performed shortly with an additional 50 ml of contrast for expeditious  evaluation of the chest and mediastinum given his contrast allergy. Findings discussed with EDP.  CTA HEAD  Findings:  There  is no evidence for acute infarction, intracranial hemorrhage, mass lesion, hydrocephalus, or extra-axial fluid.  Mild atrophy.  Mild white matter disease.  Post infusion, no abnormal enhancement.  Intact calvarium.  The  intracranial vasculature is dolichoectatic but patent.  No intracranial dissection or berry aneurysm is present.  The left vertebral is dominant.  There is no proximal vascular occlusion or significant flow reducing lesion. Similar appearance to priors.   Review of the MIP images confirms the above findings.  IMPRESSION: Unremarkable CTA head.   Original Report Authenticated By: Rolla Flatten, M.D.   Ct Angio Neck W/cm &/or Wo/cm  11/13/2012   *RADIOLOGY REPORT*  Clinical Data:  Numbness all over.  History of bilateral ICA pseudoaneurysms.  Headache.  THE PATIENT WAS PREMEDICATED WITH IV STEROIDS  BECAUSE OF THE HISTORY OF CONTRAST ALLERGY.  THERE WERE NO ADVERSE EVENTS.  CT ANGIOGRAPHY HEAD AND NECK  Technique:  Multidetector CT imaging of the head and neck was performed using the standard protocol during bolus administration of intravenous contrast.  Multiplanar CT image reconstructions including MIPs were obtained to evaluate the vascular anatomy. Carotid stenosis measurements (when applicable) are obtained utilizing NASCET criteria, using the distal internal carotid diameter as the denominator.  Contrast: 40mL OMNIPAQUE IOHEXOL 350 MG/ML SOLN  Comparison:  08/25/2011 CT angio neck.  10/03/2008 CT angio head.  CTA NECK  Findings:  Right chest cardiac pacemaker.  COPD.  Spiculated left upper lobe lesion is new from priors measuring 11 x 15 mm.  In the setting of chronic of tobacco abuse, concern raised for pulmonary neoplasm.  Previously identified and biopsied, spiculated lesion superior segment left lower lobe lesion, not well seen on today's examination.  Stable subcentimeter thyroid nodules.  Negative larynx.  Advanced spondylosis.  Three-vessel arch configuration without proximal stenosis.   Unremarkable right common carotid artery.  Mild nonstenotic posterior wall plaque.  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  No stenosis.  No significant left or right vertebral artery pathology.  Left vertebral dominant.  Unremarkable left common carotid artery.  Posterior wall plaque is non stenotic in the proximal left internal carotid artery. Rim calcified predominately thrombosed 7 x 7 x 8 mm more inferior cervical ICA aneurysm projecting medially is unchanged.  More superiorly, a  partially thrombosed and calcified skull base cervical ICA aneurysm measuring 8 x 10 x 11 mm is also stable. Lateral thrombus extends cephalad to the level of the horizontal petrous/foramen lacerum level.  No ICA dissection.   Review of the MIP images confirms the above findings.  IMPRESSION:  Stable extracranial atherosclerotic change and stable bilateral cervical ICA pseudoaneurysms.  No evidence for enlargement, the dissection, or neck hemorrhage.  New spiculated left upper lobe 11 x 15 mm lesion; in the setting of COPD cannot exclude squamous cell carcinoma. As the patient has been premedicated, CT chest will be performed shortly with an additional 50 ml of contrast for expeditious  evaluation of the chest and mediastinum given his contrast allergy. Findings discussed with EDP.  CTA HEAD  Findings:  There is no evidence for acute infarction, intracranial hemorrhage, mass lesion, hydrocephalus, or extra-axial fluid.  Mild atrophy.  Mild white matter disease.  Post infusion, no abnormal enhancement.  Intact calvarium.  The  intracranial vasculature is dolichoectatic but patent.  No intracranial dissection or berry aneurysm is present.  The left vertebral is dominant.  There is no proximal vascular occlusion or significant flow reducing lesion. Similar appearance to priors.   Review of the MIP images confirms  the above findings.  IMPRESSION: Unremarkable CTA head.   Original Report Authenticated By: Rolla Flatten, M.D.   Ct Chest W Contrast  11/22/2012   *RADIOLOGY REPORT*  Clinical Data: Shortness of breath, weakness, abnormality on recently performed neck CTA.  CT CHEST WITH CONTRAST  Technique:  Multidetector CT imaging of the chest was performed following the standard protocol during bolus administration of intravenous contrast.  Contrast: 30mL OMNIPAQUE IOHEXOL 300 MG/ML  SOLN  Comparison: Neck CT head - earlier same day; chest CT - 07/14/2012; 09/03/2011  Findings:  Stable postsurgical change of the superior segment of the left lower lobe.  While the external diameter  of the previous identified approximately 1.3 x 1.0 cm nodule within the left lung apex (image 13, series 3) is grossly unchanged, the nodule appears more consolidated on the present examination, with slight differences possibly attributable to slice selection.  Additional approximately 8 mm ground-glass nodule within the right lower lobe (image 43, series 3) appears grossly unchanged, as does an approximately 5 mm ground-glass nodule within the superior segment of the lingula (image 34, series 3).  No new discrete pulmonary nodules.  Scattered shoddy mediastinal lymph nodes appear grossly unchanged and are individually not enlarged by size criteria with index pretracheal node measuring 9 mm in short axis diameter (image 23, series 2). No definite hilar or axillary lymphadenopathy.  Advanced centrilobular emphysematous change.  Minimal dependent subsegmental atelectasis.  No focal airspace opacity.  No pleural effusion or pneumothorax.  The central pulmonary airways are patent.  Normal heart size.  Right anterior chest wall dual lead pacemaker. No pericardial effusion.  Normal caliber of the main pulmonary artery. Grossly unchanged mild ectasia of the ascending thoracic aorta measuring approximately 4.1 cm in greatest oblique short axis diameter (image  30, series 2).  Conventional configuration of the aortic arch.  Scattered minimal atherosclerotic plaque within the thoracic aorta.  No definite thoracic aortic dissection or periaortic stranding.  Limited evaluation of the upper abdomen demonstrates a partially exophytic approximately 3.0 cm cyst arising from the posterior superior aspect of the right kidney.  Several additional smaller renal cysts are noted bilaterally.  Post cholecystectomy.  No acute or aggressive osseous abnormalities.  IMPRESSION:  1.  No change to minimal increase in the indeterminate approximately 1.3 cm nodule within the left lung apex, with slight differences in possibly attributable to slice selection.  While possibly representing of scar, given advanced emphysematous change, a slowly growing lung cancer is not excluded.  As such, further evaluation with a follow-up chest CT in 3 months is recommended.  2.  Grossly unchanged bilateral sub centimeter ground-glass nodules, likely too small to accurately characterize.  Continued attention on follow-up is recommended.  3.  Stable postsurgical change of the superior segment of the left lower lobe.  4.  Grossly unchanged mild fusiform ectasia of the descending thoracic aorta measuring approximately 41 mm in diameter.  Above findings discussed with Dr. Zenia Resides at the time of examination completion.   Original Report Authenticated By: Jake Seats, MD   Ct Chest Wo Contrast  07/14/2012  *RADIOLOGY REPORT*  Clinical Data: Follow-up lung cancer.  CT CHEST WITHOUT CONTRAST  Technique:  Multidetector CT imaging of the chest was performed following the standard protocol without IV contrast.  Comparison: CT scan 08/18/2011.  Findings: The chest wall is stable.  A permanent right-sided pacemaker is noted.  No supraclavicular or axillary lymphadenopathy.  The bony thorax is intact.  No destructive bone lesions or spinal canal compromise.  Moderate degenerative changes involving the spine.  The heart is  normal in size.  No pericardial effusion.  No mediastinal or hilar lymphadenopathy.  Stable tortuosity and ectasia of the thoracic aorta.  The esophagus is grossly normal.  Examination of the lung parenchyma demonstrates stable advanced emphysematous changes and pulmonary interstitial scarring.  The left upper lobe pulmonary nodule has been excised.  No findings for recurrent or residual tumor.  There is a new left apical lung density.  This could reflect progressive scarring change but does need observation.  There is a 7 mm nodule at the right lung base.  This is difficult to see  on the prior CT but was a on the prior PET CT and needs continued observation.  There is an ill-defined nodular density measuring approximate 5 mm in the superior segment of the left lower lobe on image number 31. I believe this was present on the prior CT scan although it is more obvious now.  A 4 mm nodule is noted on image number 22 in the left upper lobe. This was present on the prior study and has not significantly changed.  No acute pulmonary findings or pleural effusion.  The upper abdomen is unremarkable.  Stable right renal cyst.  IMPRESSION:  1.  Surgical changes from prior excision of a left apical lung neoplasm. 2.  Advanced emphysematous changes and pulmonary scarring. 3.  New 13 x 8 mm left apical lung density.  This could reflect scarring change but needs close observation. 4.  Other small pulmonary nodules as discussed above.  Recommend continued observation. 5.  No acute pulmonary findings and no mediastinal or hilar lymphadenopathy. 6.  Stable tortuosity and ectasia of the thoracic aorta.   Original Report Authenticated By: Marijo Sanes, M.D.       Recent Labs: Lab Results  Component Value Date   WBC 10.4 12/06/2012   HGB 17.5* 12/06/2012   HCT 47.8 12/06/2012   PLT 272 12/06/2012   GLUCOSE 95 11/13/2012   CHOL 192 07/22/2011   TRIG 76 07/22/2011   HDL 57 07/22/2011   LDLCALC 120* 07/22/2011   ALT 11 10/22/2011   AST  19 10/22/2011   NA 138 11/13/2012   K 3.6 11/13/2012   CL 103 11/13/2012   CREATININE 0.99 11/13/2012   BUN 13 11/13/2012   CO2 25 11/13/2012   TSH 4.001 06/13/2011   INR 1.02 12/06/2012   HGBA1C 5.0 06/13/2011      Assessment / Plan:    Wedge resection of Stage I INVASIVE MODERATELY DIFFERENTIATED ADENOCARCINOMA, SPANNING 2.2 CM IN GREATEST DIMENSION superior segment left lower lobe 2013 The patient underwent stereotactic radiotherapy to the left upper lobe lesion. Completed 01/03/13 EGFR positive Significant underlying emphysematous changes on CT scan  Currently the CT scan done 08/26/2013  shows  stability of multiple bilateral pulmonary nodules.And no evidence on CT scan of any rib lesions.  We'll plan to see him back in 4 months with a followup CT scan of the chest to followup pulmonary lung nodules. I've encouraged him to see his primary care doctor for evaluation of his somatic complaints, and history of fibromyalgia.  Lania Zawistowski B 08/31/2013 10:50 AM

## 2013-08-31 NOTE — Telephone Encounter (Signed)
Called patient [name], no answer on home phone called cell phone, spoke with patient, he forgot appt with Dr.Wentworth today ,"I saw Dr.Gerhardt today, I remember that was the only appt I thought I had, I am so sorry but my wife is in the hospital and can you call back on my home phone in about an hour,I'll reschedule my appt with Dr.Wenworth, and tell her I'm so sorry",  1:23 PM

## 2013-09-01 ENCOUNTER — Encounter: Payer: Self-pay | Admitting: Cardiology

## 2013-09-06 ENCOUNTER — Encounter: Payer: Self-pay | Admitting: Internal Medicine

## 2013-09-11 ENCOUNTER — Encounter: Payer: Self-pay | Admitting: Vascular Surgery

## 2013-09-12 ENCOUNTER — Other Ambulatory Visit (HOSPITAL_COMMUNITY): Payer: Medicare Other

## 2013-09-12 ENCOUNTER — Ambulatory Visit: Payer: Medicare Other | Admitting: Vascular Surgery

## 2013-09-12 ENCOUNTER — Other Ambulatory Visit: Payer: Medicare Other

## 2013-09-18 ENCOUNTER — Inpatient Hospital Stay (HOSPITAL_COMMUNITY)
Admission: EM | Admit: 2013-09-18 | Discharge: 2013-09-21 | DRG: 377 | Disposition: A | Discharge status: Home / Self Care | Payer: Medicare Other | Attending: Internal Medicine | Admitting: Internal Medicine

## 2013-09-18 ENCOUNTER — Encounter (HOSPITAL_COMMUNITY): Payer: Self-pay | Admitting: Emergency Medicine

## 2013-09-18 DIAGNOSIS — T45515A Adverse effect of anticoagulants, initial encounter: Secondary | ICD-10-CM | POA: Diagnosis present

## 2013-09-18 DIAGNOSIS — E43 Unspecified severe protein-calorie malnutrition: Secondary | ICD-10-CM | POA: Insufficient documentation

## 2013-09-18 DIAGNOSIS — I2699 Other pulmonary embolism without acute cor pulmonale: Secondary | ICD-10-CM | POA: Diagnosis present

## 2013-09-18 DIAGNOSIS — Z7901 Long term (current) use of anticoagulants: Secondary | ICD-10-CM

## 2013-09-18 DIAGNOSIS — C3410 Malignant neoplasm of upper lobe, unspecified bronchus or lung: Secondary | ICD-10-CM | POA: Diagnosis present

## 2013-09-18 DIAGNOSIS — Z8546 Personal history of malignant neoplasm of prostate: Secondary | ICD-10-CM

## 2013-09-18 DIAGNOSIS — D62 Acute posthemorrhagic anemia: Secondary | ICD-10-CM | POA: Diagnosis present

## 2013-09-18 DIAGNOSIS — K921 Melena: Secondary | ICD-10-CM | POA: Diagnosis present

## 2013-09-18 DIAGNOSIS — J438 Other emphysema: Secondary | ICD-10-CM

## 2013-09-18 DIAGNOSIS — F329 Major depressive disorder, single episode, unspecified: Secondary | ICD-10-CM | POA: Diagnosis present

## 2013-09-18 DIAGNOSIS — Z923 Personal history of irradiation: Secondary | ICD-10-CM

## 2013-09-18 DIAGNOSIS — I442 Atrioventricular block, complete: Secondary | ICD-10-CM | POA: Diagnosis present

## 2013-09-18 DIAGNOSIS — E785 Hyperlipidemia, unspecified: Secondary | ICD-10-CM | POA: Diagnosis present

## 2013-09-18 DIAGNOSIS — R000 Tachycardia, unspecified: Secondary | ICD-10-CM

## 2013-09-18 DIAGNOSIS — H9190 Unspecified hearing loss, unspecified ear: Secondary | ICD-10-CM | POA: Diagnosis present

## 2013-09-18 DIAGNOSIS — Z79899 Other long term (current) drug therapy: Secondary | ICD-10-CM

## 2013-09-18 DIAGNOSIS — Z950 Presence of cardiac pacemaker: Secondary | ICD-10-CM

## 2013-09-18 DIAGNOSIS — N179 Acute kidney failure, unspecified: Secondary | ICD-10-CM | POA: Diagnosis present

## 2013-09-18 DIAGNOSIS — F3289 Other specified depressive episodes: Secondary | ICD-10-CM | POA: Diagnosis present

## 2013-09-18 DIAGNOSIS — Z87891 Personal history of nicotine dependence: Secondary | ICD-10-CM

## 2013-09-18 DIAGNOSIS — Z86711 Personal history of pulmonary embolism: Secondary | ICD-10-CM

## 2013-09-18 DIAGNOSIS — R0600 Dyspnea, unspecified: Secondary | ICD-10-CM

## 2013-09-18 DIAGNOSIS — I10 Essential (primary) hypertension: Secondary | ICD-10-CM | POA: Diagnosis present

## 2013-09-18 DIAGNOSIS — I2510 Atherosclerotic heart disease of native coronary artery without angina pectoris: Secondary | ICD-10-CM | POA: Diagnosis present

## 2013-09-18 DIAGNOSIS — J4489 Other specified chronic obstructive pulmonary disease: Secondary | ICD-10-CM | POA: Diagnosis present

## 2013-09-18 DIAGNOSIS — I720 Aneurysm of carotid artery: Secondary | ICD-10-CM | POA: Diagnosis present

## 2013-09-18 DIAGNOSIS — K264 Chronic or unspecified duodenal ulcer with hemorrhage: Principal | ICD-10-CM | POA: Diagnosis present

## 2013-09-18 DIAGNOSIS — R791 Abnormal coagulation profile: Secondary | ICD-10-CM

## 2013-09-18 DIAGNOSIS — K922 Gastrointestinal hemorrhage, unspecified: Secondary | ICD-10-CM | POA: Diagnosis present

## 2013-09-18 DIAGNOSIS — J449 Chronic obstructive pulmonary disease, unspecified: Secondary | ICD-10-CM

## 2013-09-18 DIAGNOSIS — Z85118 Personal history of other malignant neoplasm of bronchus and lung: Secondary | ICD-10-CM

## 2013-09-18 DIAGNOSIS — H5460 Unqualified visual loss, one eye, unspecified: Secondary | ICD-10-CM | POA: Diagnosis present

## 2013-09-18 DIAGNOSIS — K2990 Gastroduodenitis, unspecified, without bleeding: Secondary | ICD-10-CM

## 2013-09-18 DIAGNOSIS — IMO0002 Reserved for concepts with insufficient information to code with codable children: Secondary | ICD-10-CM

## 2013-09-18 DIAGNOSIS — R578 Other shock: Secondary | ICD-10-CM | POA: Diagnosis present

## 2013-09-18 DIAGNOSIS — N19 Unspecified kidney failure: Secondary | ICD-10-CM | POA: Diagnosis present

## 2013-09-18 DIAGNOSIS — R571 Hypovolemic shock: Secondary | ICD-10-CM | POA: Diagnosis present

## 2013-09-18 DIAGNOSIS — G2581 Restless legs syndrome: Secondary | ICD-10-CM | POA: Diagnosis present

## 2013-09-18 DIAGNOSIS — K2970 Gastritis, unspecified, without bleeding: Secondary | ICD-10-CM | POA: Diagnosis present

## 2013-09-18 LAB — CBC
HCT: 30.7 % — ABNORMAL LOW (ref 39.0–52.0)
HEMOGLOBIN: 10.7 g/dL — AB (ref 13.0–17.0)
MCH: 32.5 pg (ref 26.0–34.0)
MCHC: 34.9 g/dL (ref 30.0–36.0)
MCV: 93.3 fL (ref 78.0–100.0)
Platelets: 228 10*3/uL (ref 150–400)
RBC: 3.29 MIL/uL — AB (ref 4.22–5.81)
RDW: 13.6 % (ref 11.5–15.5)
WBC: 9.1 10*3/uL (ref 4.0–10.5)

## 2013-09-18 LAB — COMPREHENSIVE METABOLIC PANEL
ALT: 10 U/L (ref 0–53)
AST: 15 U/L (ref 0–37)
Albumin: 3 g/dL — ABNORMAL LOW (ref 3.5–5.2)
Alkaline Phosphatase: 62 U/L (ref 39–117)
BUN: 51 mg/dL — ABNORMAL HIGH (ref 6–23)
CALCIUM: 10 mg/dL (ref 8.4–10.5)
CO2: 25 meq/L (ref 19–32)
Chloride: 106 mEq/L (ref 96–112)
Creatinine, Ser: 0.8 mg/dL (ref 0.50–1.35)
GFR calc non Af Amer: 83 mL/min — ABNORMAL LOW (ref >90)
GLUCOSE: 89 mg/dL (ref 70–99)
POTASSIUM: 4 meq/L (ref 3.7–5.3)
SODIUM: 139 meq/L (ref 137–147)
TOTAL PROTEIN: 5.6 g/dL — AB (ref 6.0–8.3)
Total Bilirubin: 0.4 mg/dL (ref 0.3–1.2)

## 2013-09-18 LAB — PROTIME-INR
INR: 3.99 — ABNORMAL HIGH (ref 0.00–1.49)
Prothrombin Time: 38.9 seconds — ABNORMAL HIGH (ref 11.6–15.2)

## 2013-09-18 LAB — TROPONIN I: Troponin I: <0.3 ng/mL (ref <0.30)

## 2013-09-18 LAB — POC OCCULT BLOOD, ED: Fecal Occult Bld: POSITIVE — AB

## 2013-09-18 LAB — MRSA PCR SCREENING: MRSA by PCR: NEGATIVE

## 2013-09-18 MED ORDER — DIPHENHYDRAMINE HCL 50 MG/ML IJ SOLN
12.5000 mg | Freq: Four times a day (QID) | INTRAMUSCULAR | Status: DC | PRN
  Administered 2013-09-18: 12.5 mg via INTRAVENOUS
  Filled 2013-09-18: qty 1

## 2013-09-18 MED ORDER — SODIUM CHLORIDE 0.9 % IV SOLN
INTRAVENOUS | Status: AC
  Administered 2013-09-18: 19:00:00 via INTRAVENOUS

## 2013-09-18 MED ORDER — SODIUM CHLORIDE 0.9 % IV SOLN
Freq: Once | INTRAVENOUS | Status: AC
  Administered 2013-09-18: 16:00:00 via INTRAVENOUS

## 2013-09-18 MED ORDER — CYCLOBENZAPRINE HCL 5 MG PO TABS
5.0000 mg | ORAL_TABLET | Freq: Once | ORAL | Status: AC
  Administered 2013-09-18: 5 mg via ORAL
  Filled 2013-09-18 (×2): qty 1

## 2013-09-18 MED ORDER — VITAMIN K1 10 MG/ML IJ SOLN
10.0000 mg | Freq: Once | INTRAMUSCULAR | Status: DC
  Filled 2013-09-18: qty 1

## 2013-09-18 MED ORDER — DEXTROSE 5 % IV SOLN
10.0000 mg | Freq: Once | INTRAVENOUS | Status: AC
Start: 2013-09-18 — End: 2013-09-18
  Administered 2013-09-18: 10 mg via INTRAVENOUS
  Filled 2013-09-18: qty 1

## 2013-09-18 MED ORDER — SODIUM CHLORIDE 0.9 % IV SOLN
80.0000 mg | Freq: Once | INTRAVENOUS | Status: AC
  Administered 2013-09-18: 80 mg via INTRAVENOUS
  Filled 2013-09-18: qty 80

## 2013-09-18 NOTE — ED Notes (Signed)
Admitting at bedside 

## 2013-09-18 NOTE — ED Notes (Signed)
Pt was sent to ED by [facility] from home by recommendation of his PCP for evaluation for dark tarry stools and dehydration.  Pt reports he began feeling nauseous and had first of multiple dark tarry stools yesterday. Per EMS pt's HR was 130 upon their arrival today. EMS gave 526mL bolus NS and 4mg  Zofran with a decrease in nausea and decrease in HR to 100bpm. Pt was NSR with frequent PVC's. PT alert and interactive in NAD.

## 2013-09-18 NOTE — Progress Notes (Signed)
[facility] History and Physical  [name] KGU:[id number] DOB: Oct 27, 1932 DOA: 09/18/2013  Referring physician: ED PCP: [name], MD  Specialists: [facility]  Chief Complaint: GI bleed  HPI: 80 y/o ? known h/o lung cancer  S/p resectionT1 B. moderately differentiated adenocarcinoma the lung 2 cm in size located in the superior segment of the left lower lobe 10/20/2011. The patient previously had full left thoracotomy many years ago for "cyst". H/o PE in 2005 on Coumadin, Heart block s/p PPM 1991 [facility] 2 ppm, NOn OPbst CAD cath 2013, COPD smoker till 2006, hyperparathyroidism, Prostate Ca s/p Cryotherapy, h/o bilateral distal Carotid pseudoaneurysms Is feeling dizzy and unwell over the past he didn't have. Had a dark and tarry stool this morning. Went to primary care physician noted to be melena positive and sent to the emergency room. He states that he takes occasionally Advil from 1-2 tablets a day nobody but has not and does not drink alcohol. He has not had any specific chest pain or shortness of breath but still felt dizzy even after IV saline bolus Gastroenterology was in the room evaluating patient and I was present.  Labs in the emergency room BUN 51, creatinine 0.8 INR 3.99 Hemoglobin 10.1, baseline 15 EKG sinus tachycardia PR interval 0.12 QRS axis 30 ST elevations in V5 and the 2 suggestive of rapid repolarization or LVH.   Review of Systems: The patient denies  Fever Chills Cough Cold Sputum Diarrhea Hematemesis Abdominal pain Dysuria Rash Weakness in one side of the body  Past Medical History  Diagnosis Date  . Hyperlipidemia   . Hypertension   . Esophageal reflux   . Diverticulosis   . Rheumatic fever ~ 1944  . Vision loss of left eye     S/P "cataract OR"; "can see a little; not much"  . Kidney stones   . Hard of hearing, left   . Pulmonary embolism     On Xarelto  . Hypercalcemia   . Arthritis     "knees"  . CAD  (coronary artery disease)     nonobstructive by cath 07/22/11 with small to moderate sized diagonal branch with ostial stenosis , medical therapy advised  . AV block     s/p PPM  . COPD (chronic obstructive pulmonary disease)   . Carotid artery aneurysm     Bilateral internal carotid artery aneurysms followed by Dr. Donnetta Hutching  . Shortness of breath     with ambulation  . Constipation   . Shingles 12/04/2012    right lateral anterior chest  . Radiation 10/7,10/9,01/03/2013    SBRT Left upper lobe lung  . Prostate cancer     s/p cryotherapy  . Lung cancer, primary, with metastasis from lung to other site dx'd 09/2011 & 11/2012   Past Surgical History  Procedure Laterality Date  . Cystoscopy    . Lung surgery  1957    "born w/bleb in [place]  . Insert / replace / remove pacemaker  1991    initial placement  . Insert / replace / remove pacemaker  ~ 2011    "changed out"  . Tonsillectomy and adenoidectomy  1943  . Appendectomy  2000's  . Cholecystectomy  2000's  . Cataract extraction w/ intraocular lens implant  ~ 2008    left eye  . Lithotripsy  ~ 2011    "twice"  . Cardiac catheterization    . Prostate cryoablation  ~ 2008  . Video bronchoscopy  10/20/2011    Procedure: VIDEO  BRONCHOSCOPY;  Surgeon: Grace Isaac, MD;  Location: [facility] OR;  Service: Thoracic;  Laterality: N/A;  . Needle core biopsy  12/06/2012    Lung/lul   Social History:  History   Social History Narrative   2 caffeine drink daily     Allergies  Allergen Reactions  . Iohexol      Desc: PT STATED TODAY HE GETS SLIGHT SOB FROM IV CONTRAST. HE NEEDS FULL PREMEDS FROM NOW ON PER DR. MATTERN   . Penicillins Itching and Rash    "haven't took none in 40 years"    Family History  Problem Relation Age of Onset  . Hypertension Neg Hx   . Hyperlipidemia Neg Hx   . Heart failure Neg Hx   . Heart disease Neg Hx   . Colon cancer Neg Hx   . Breast cancer Mother   . Emphysema Sister     was a smoker  . Brain cancer  Father 6     type unknown     Prior to Admission medications   Medication Sig Start Date End Date Taking? Authorizing Dayanira Giovannetti  albuterol (PROVENTIL,VENTOLIN) 90 MCG/ACT inhaler Inhale 2 puffs into the lungs every 6 (six) hours as needed for shortness of breath. For wheezing or shortness of breath 04/12/11 09/18/13 Yes Nishant Dhungel, MD  DULoxetine (CYMBALTA) 60 MG capsule Take 60 mg by mouth daily.   Yes Historical Denia Mcvicar, MD  ibuprofen (ADVIL) 200 MG tablet Take 400 mg by mouth every 6 (six) hours as needed for moderate pain.    Yes Historical Shabrea Weldin, MD  losartan (COZAAR) 50 MG tablet Take 50 mg by mouth daily.   Yes Historical Dorisann Schwanke, MD  nitroGLYCERIN (NITROSTAT) 0.4 MG SL tablet Place 1 tablet (0.4 mg total) under the tongue every 5 (five) minutes as needed. For chest pain 06/17/12  Yes Thompson Grayer, MD  warfarin (COUMADIN) 5 MG tablet Take 5-7.5 mg by mouth daily. Take 5mg  for 6 days, on the 7th day take 7.5mg    Yes Historical Makell Drohan, MD   Physical Exam: Filed Vitals:   09/18/13 1442 09/18/13 1516 09/18/13 1603 09/18/13 1619  BP: 112/61 106/64 129/58 117/60  Pulse: 109 117 111 108  Temp: 97.8 F (36.6 C)     TempSrc: Oral     Resp: 22 19 23 29   SpO2: 99% 100% 98% 99%     General:  A pleasant oriented hard of hearing  Eyes: EOMI NCAT mild pallor  ENT: Soft supple  Neck: See above  Cardiovascular: S1-S2 tachycardic  Respiratory: Clinically clear no added sounds  Abdomen: Soft nontender no rebound no guarding  Skin: No lower extremity edema  Musculoskeletal: Range of motion intact  Psychiatric: Euthymic doesn't  Neurologic: Grossly intact 5/5 power reflexes deferredFinger-nose-finger normal extraocular movements intact  Smile symmetric   Labs on Admission:  Basic Metabolic Panel:  Recent Labs Lab 09/18/13 1521  NA 139  K 4.0  CL 106  CO2 25  GLUCOSE 89  BUN 51*  CREATININE 0.80  CALCIUM 10.0   Liver Function Tests:  Recent Labs Lab  09/18/13 1521  AST 15  ALT 10  ALKPHOS 62  BILITOT 0.4  PROT 5.6*  ALBUMIN 3.0*   No results found for this basename: LIPASE, AMYLASE,  in the last 168 hours No results found for this basename: AMMONIA,  in the last 168 hours CBC:  Recent Labs Lab 09/18/13 1521  WBC 9.1  HGB 10.7*  HCT 30.7*  MCV 93.3  PLT 228  Cardiac Enzymes:  Recent Labs Lab 09/18/13 1521  TROPONINI <0.30    BNP (last 3 results) No results found for this basename: PROBNP,  in the last 8760 hours CBG: No results found for this basename: GLUCAP,  in the last 168 hours  Radiological Exams on Admission: No results found.  EKG: Independently reviewed. See above  Assessment/Plan Principal Problem:   Hypovolemic shock-patient has acute blood loss anemia likely from upper GI bleed given the symptomatology and the fact he is on Coumadin. Gastric throat is very seen in. I have requested that he be continued plan of reversing INR 3.99 with FFP as well as vitamin K as has already been ordered. He would need fluids transiently at 150 cc per hour we'll re-evaluate him in the morning to ensure that his hemoglobin does not drop precipitously. See below   History of lung cancer-continue interval surveillance as an outpatient. Completed radiation therapy to left lung 01/03/2013   Melena-precipitant likely ibuprofen in addition to chronic anticoagulation which will need to be revisited this hospital stay.   UGIB (upper gastrointestinal bleed)-eventually will need endoscopy however clinically stable presently   Anemia due to blood loss, acute-baseline hemoglobin 15 today it is 10. We will repeat that counts every 8 hours initially and type and screen. He should be transfused if he Hospital 8.0   Complete heart block s/p PPM 1991-Stable   Acute uremia-2/2 to UGIB,, should resolve.  Monitor in am c labs   PE (pulmonary embolism) 2005 on Coumadin-he's been reversed right now with vitamin K and FFP. He will need to come  off of Coumadin I suspect   COPD (chronic obstructive pulmonary disease)-clinically stable   Personal history of prostate cancer s/p cryosurg-clinically stable  Time spent: 45 minutes No family  at bedside [place], [place] [facility] Pager [phone]  If 7PM-7AM, please contact night-coverage [email or web address] Password [facility] 09/18/2013, 4:38 PM

## 2013-09-18 NOTE — Consult Note (Signed)
[facility] CONSULT Reason for consult: G.I. bleeding. Referring Physician: ED. PCP: Dr. [name]. Primary G.I.: Dr. Magod [name] is an 80 y.o. male.  HPI: he has a history of lung cancer with previous wedge resection 2013 and stereotactic radiotherapy to left upper lobe lesion 10/14. Had biopsies positive for adenocarcinoma and followed by Dr. Gerhardt. This had anticoagulation for 15 years with Coumadin followed now by Dr. Allred previously by Dr. Brody. The patient is developed painless melena started yesterday. He had dark are stools and felt somewhat dizzy and nauseous. Was seen by EMS and pulse was 130 and he was given Zofran and normal saline bolus. He does not take regular medications for acid and has not had problems with ulcers. He had surveillance colonoscopy by Dr. Magod 2010 it was negative. Has had previous colon polyps removed. He notes that he takes Advil nearly every night 1 or 2 tablets. No  chronic heartburn and indigestion.  Past Medical History  Diagnosis Date  . Hyperlipidemia   . Hypertension   . Esophageal reflux   . Diverticulosis   . Rheumatic fever ~ 1944  . Vision loss of left eye     S/P "cataract OR"; "can see a little; not much"  . Kidney stones   . Hard of hearing, left   . Pulmonary embolism     On Xarelto  . Hypercalcemia   . Arthritis     "knees"  . CAD (coronary artery disease)     nonobstructive by cath 07/22/11 with small to moderate sized diagonal branch with ostial stenosis , medical therapy advised  . AV block     s/p PPM  . COPD (chronic obstructive pulmonary disease)   . Carotid artery aneurysm     Bilateral internal carotid artery aneurysms followed by Dr. Early  . Shortness of breath     with ambulation  . Constipation   . Shingles 12/04/2012    right lateral anterior chest  . Radiation 10/7,10/9,01/03/2013    SBRT Left upper lobe lung  . Prostate cancer     s/p cryotherapy  . Lung cancer, primary, with metastasis from  lung to other site dx'd 09/2011 & 11/2012    Past Surgical History  Procedure Laterality Date  . Cystoscopy    . Lung surgery  1957    "born [place]bleb [place]  . Insert / replace / remove pacemaker  1991    initial placement  . Insert / replace / remove pacemaker  ~ 2011    "changed out"  . Tonsillectomy and adenoidectomy  1943  . Appendectomy  2000's  . Cholecystectomy  2000's  . Cataract extraction w/ intraocular lens implant  ~ 2008    left eye  . Lithotripsy  ~ 2011    "twice"  . Cardiac catheterization    . Prostate cryoablation  ~ 2008  . Video bronchoscopy  10/20/2011    Procedure: VIDEO BRONCHOSCOPY;  Surgeon: Edward B Gerhardt, MD;  Location: [facility] OR;  Service: Thoracic;  Laterality: N/A;  . Needle core biopsy  12/06/2012    Lung/lul    Family History  Problem Relation Age of Onset  . Hypertension Neg Hx   . Hyperlipidemia Neg Hx   . Heart failure Neg Hx   . Heart disease Neg Hx   . Colon cancer Neg Hx   . Breast cancer Mother   . Emphysema Sister     was a smoker  . Brain cancer Father 24       type unknown    Social History:  reports that he quit smoking about 9 years ago. His smoking use included Cigarettes. He has a 50 pack-year smoking history. His smokeless tobacco use includes Chew. He reports that he does not drink alcohol or use illicit drugs.  Allergies:  Allergies  Allergen Reactions  . Iohexol      Desc: PT STATED TODAY HE GETS SLIGHT SOB FROM IV CONTRAST. HE NEEDS FULL PREMEDS FROM NOW ON PER DR. MATTERN   . Penicillins Itching and Rash    "haven't took none in 40 years"    Medications; Prior to Admission medications   Medication Sig Start Date End Date Taking? Authorizing Provider  albuterol (PROVENTIL,VENTOLIN) 90 MCG/ACT inhaler Inhale 2 puffs into the lungs every 6 (six) hours as needed for shortness of breath. For wheezing or shortness of breath 04/12/11 09/18/13 Yes Nishant Dhungel, MD  DULoxetine (CYMBALTA) 60 MG capsule Take 60 mg by mouth  daily.   Yes Historical Provider, MD  ibuprofen (ADVIL) 200 MG tablet Take 400 mg by mouth every 6 (six) hours as needed for moderate pain.    Yes Historical Provider, MD  losartan (COZAAR) 50 MG tablet Take 50 mg by mouth daily.   Yes Historical Provider, MD  nitroGLYCERIN (NITROSTAT) 0.4 MG SL tablet Place 1 tablet (0.4 mg total) under the tongue every 5 (five) minutes as needed. For chest pain 06/17/12  Yes James Allred, MD  warfarin (COUMADIN) 5 MG tablet Take 5-7.5 mg by mouth daily. Take 5mg for 6 days, on the 7th day take 7.5mg   Yes Historical Provider, MD     PRN Meds  Results for orders placed during the hospital encounter of 09/18/13 (from the past 48 hour(s))  CBC     Status: Abnormal   Collection Time    09/18/13  3:21 PM      Result Value Ref Range   WBC 9.1  4.0 - 10.5 K/uL   RBC 3.29 (*) 4.22 - 5.81 MIL/uL   Hemoglobin 10.7 (*) 13.0 - 17.0 g/dL   HCT 30.7 (*) 39.0 - 52.0 %   MCV 93.3  78.0 - 100.0 fL   MCH 32.5  26.0 - 34.0 pg   MCHC 34.9  30.0 - 36.0 g/dL   RDW 13.6  11.5 - 15.5 %   Platelets 228  150 - 400 K/uL  COMPREHENSIVE METABOLIC PANEL     Status: Abnormal   Collection Time    09/18/13  3:21 PM      Result Value Ref Range   Sodium 139  137 - 147 mEq/L   Potassium 4.0  3.7 - 5.3 mEq/L   Chloride 106  96 - 112 mEq/L   CO2 25  19 - 32 mEq/L   Glucose, Bld 89  70 - 99 mg/dL   BUN 51 (*) 6 - 23 mg/dL   Creatinine, Ser 0.80  0.50 - 1.35 mg/dL   Calcium 10.0  8.4 - 10.5 mg/dL   Total Protein 5.6 (*) 6.0 - 8.3 g/dL   Albumin 3.0 (*) 3.5 - 5.2 g/dL   AST 15  0 - 37 U/L   ALT 10  0 - 53 U/L   Alkaline Phosphatase 62  39 - 117 U/L   Total Bilirubin 0.4  0.3 - 1.2 mg/dL   GFR calc non Af Amer 83 (*) >90 mL/min   GFR calc Af Amer >90  >90 mL/min   Comment: (NOTE)     The eGFR has   been calculated using the CKD EPI equation.     This calculation has not been validated in all clinical situations.     eGFR's persistently <90 mL/min signify possible Chronic Kidney      Disease.  TYPE AND SCREEN     Status: None   Collection Time    09/18/13  3:21 PM      Result Value Ref Range   ABO/RH(D) O POS     Antibody Screen NEG     Sample Expiration 09/21/2013    PROTIME-INR     Status: Abnormal   Collection Time    09/18/13  3:21 PM      Result Value Ref Range   Prothrombin Time 38.9 (*) 11.6 - 15.2 seconds   INR 3.99 (*) 0.00 - 1.49  TROPONIN I     Status: None   Collection Time    09/18/13  3:21 PM      Result Value Ref Range   Troponin I <0.30  <0.30 ng/mL   Comment:            Due to the release kinetics of cTnI,     a negative result within the first hours     of the onset of symptoms does not rule out     myocardial infarction with certainty.     If myocardial infarction is still suspected,     repeat the test at appropriate intervals.  POC OCCULT BLOOD, ED     Status: Abnormal   Collection Time    09/18/13  3:54 PM      Result Value Ref Range   Fecal Occult Bld POSITIVE (*) NEGATIVE    No results found.             Blood pressure 99/62, pulse 122, temperature 97.8 F (36.6 C), temperature source Oral, resp. rate 19, SpO2 100.00%.  Physical exam:   General-- thin appearing white male in no acute distress  Heart-- regular rate and rhythm Lungs--clear Abdomen-- soft and completely nontender   Assessment: 1. G.I. bleeding. Probably due to the patient's increased INR which is almost 4. He does take Advil sauce suspect that he may have NSAIDs induced gastropathy . He appears to be stable at this time.  2. Lung cancer with prior wedge per section subsequent metastatic lesion  3. History of prostate cancer status pose cryotherapy  4. History of pulmonary embolism on chronic anticoagulation  5. CAD  6. COPD  7. History of colon polyps. last colonoscopy 2010 negative   Plan: 1. Would go ahead and try to correct this coagulopathy. 2. Would empirically get PPI 3. Would people clear liquids for now. He will likely need EGD  after INR has been corrected.   EDWARDS JR,JAMES L 09/18/2013, 5:04 PM      

## 2013-09-18 NOTE — ED Provider Notes (Signed)
CSN: [id number]     Arrival date & time 09/18/13  1440 History   First MD Initiated Contact with Patient 09/18/13 1500     Chief Complaint  Patient presents with  . Melena     (Consider location/radiation/quality/duration/timing/severity/associated sxs/prior Treatment) HPI Comments: Pt comes in with cc of dark stools. Hx of CAD, PE on coumadin, AV block s/p pacemaker, diverticulosis. Pt comes in with dark, tarry bowel movement x 2, one of being a large one. Pt called PCP and was advised to come to the ER. Pt has mild abd pain in the RLQ only. He denies and epigastric chest pain. Pt is taking nsaids for pain. Pt denies any previous hx of dark stools. Pt reports being short of breath and dizzy - especially with getting up. No chest pain or near syncope.  The history is provided by the patient and medical records.    Past Medical History  Diagnosis Date  . Hyperlipidemia   . Hypertension   . Esophageal reflux   . Diverticulosis   . Rheumatic fever ~ 1944  . Vision loss of left eye     S/P "cataract OR"; "can see a little; not much"  . Kidney stones   . Hard of hearing, left   . Pulmonary embolism     On Xarelto  . Hypercalcemia   . Arthritis     "knees"  . CAD (coronary artery disease)     nonobstructive by cath 07/22/11 with small to moderate sized diagonal branch with ostial stenosis , medical therapy advised  . AV block     s/p PPM  . COPD (chronic obstructive pulmonary disease)   . Carotid artery aneurysm     Bilateral internal carotid artery aneurysms followed by Dr. Donnetta Hutching  . Shortness of breath     with ambulation  . Constipation   . Shingles 12/04/2012    right lateral anterior chest  . Radiation 10/7,10/9,01/03/2013    SBRT Left upper lobe lung  . Prostate cancer     s/p cryotherapy  . Lung cancer, primary, with metastasis from lung to other site dx'd 09/2011 & 11/2012   Past Surgical History  Procedure Laterality Date  . Cystoscopy    . Lung surgery  1957   "born w/bleb in [place]  . Insert / replace / remove pacemaker  1991    initial placement  . Insert / replace / remove pacemaker  ~ 2011    "changed out"  . Tonsillectomy and adenoidectomy  1943  . Appendectomy  2000's  . Cholecystectomy  2000's  . Cataract extraction w/ intraocular lens implant  ~ 2008    left eye  . Lithotripsy  ~ 2011    "twice"  . Cardiac catheterization    . Prostate cryoablation  ~ 2008  . Video bronchoscopy  10/20/2011    Procedure: VIDEO BRONCHOSCOPY;  Surgeon: Grace Isaac, MD;  Location: [facility] OR;  Service: Thoracic;  Laterality: N/A;  . Needle core biopsy  12/06/2012    Lung/lul   Family History  Problem Relation Age of Onset  . Hypertension Neg Hx   . Hyperlipidemia Neg Hx   . Heart failure Neg Hx   . Heart disease Neg Hx   . Colon cancer Neg Hx   . Breast cancer Mother   . Emphysema Sister     was a smoker  . Brain cancer Father 30     type unknown   History  Substance Use Topics  . Smoking  status: Former Smoker -- 1.00 packs/day for 50 years    Types: Cigarettes    Quit date: 03/23/2004  . Smokeless tobacco: Current User    Types: Chew  . Alcohol Use: No    Review of Systems  Constitutional: Positive for activity change, appetite change and fatigue. Negative for fever, chills and diaphoresis.  Eyes: Negative for visual disturbance.  Respiratory: Positive for shortness of breath. Negative for cough and chest tightness.   Cardiovascular: Negative for chest pain.  Gastrointestinal: Positive for abdominal pain and blood in stool. Negative for nausea, vomiting and abdominal distention.  Genitourinary: Negative for dysuria, enuresis and difficulty urinating.  Musculoskeletal: Negative for back pain.  Skin: Negative for rash.  Neurological: Positive for dizziness and light-headedness. Negative for syncope and headaches.  Psychiatric/Behavioral: Negative for confusion.      Allergies  Iohexol and Penicillins  Home Medications   Prior to  Admission medications   Medication Sig Start Date End Date Taking? Authorizing Provider  albuterol (PROVENTIL,VENTOLIN) 90 MCG/ACT inhaler Inhale 2 puffs into the lungs every 6 (six) hours as needed for shortness of breath. For wheezing or shortness of breath 04/12/11 09/18/13 Yes Nishant Dhungel, MD  DULoxetine (CYMBALTA) 60 MG capsule Take 60 mg by mouth daily.   Yes Historical Provider, MD  ibuprofen (ADVIL) 200 MG tablet Take 400 mg by mouth every 6 (six) hours as needed for moderate pain.    Yes Historical Provider, MD  losartan (COZAAR) 50 MG tablet Take 50 mg by mouth daily.   Yes Historical Provider, MD  nitroGLYCERIN (NITROSTAT) 0.4 MG SL tablet Place 1 tablet (0.4 mg total) under the tongue every 5 (five) minutes as needed. For chest pain 06/17/12  Yes Thompson Grayer, MD  warfarin (COUMADIN) 5 MG tablet Take 5-7.5 mg by mouth daily. Take 5mg  for 6 days, on the 7th day take 7.5mg    Yes Historical Provider, MD   BP 99/62  Pulse 122  Temp(Src) 97.8 F (36.6 C) (Oral)  Resp 19  SpO2 100% Physical Exam  Nursing note and vitals reviewed. Constitutional: He is oriented to person, place, and time. He appears well-developed.  HENT:  Head: Normocephalic and atraumatic.  Eyes: Conjunctivae and EOM are normal. Pupils are equal, round, and reactive to light.  Neck: Normal range of motion. Neck supple.  Cardiovascular: Regular rhythm.   Murmur heard. Pulmonary/Chest: Effort normal and breath sounds normal.  Abdominal: Soft. Bowel sounds are normal. He exhibits no distension. There is no tenderness. There is no rebound and no guarding.  Melenotic stools  Neurological: He is alert and oriented to person, place, and time.  Skin: Skin is warm.    ED Course  Procedures (including critical care time) Labs Review Labs Reviewed  CBC - Abnormal; Notable for the following:    RBC 3.29 (*)    Hemoglobin 10.7 (*)    HCT 30.7 (*)    All other components within normal limits  COMPREHENSIVE METABOLIC  PANEL - Abnormal; Notable for the following:    BUN 51 (*)    Total Protein 5.6 (*)    Albumin 3.0 (*)    GFR calc non Af Amer 83 (*)    All other components within normal limits  PROTIME-INR - Abnormal; Notable for the following:    Prothrombin Time 38.9 (*)    INR 3.99 (*)    All other components within normal limits  POC OCCULT BLOOD, ED - Abnormal; Notable for the following:    Fecal Occult Bld POSITIVE (*)  All other components within normal limits  TROPONIN I  TYPE AND SCREEN  PREPARE FRESH FROZEN PLASMA    Imaging Review No results found.   EKG Interpretation None      MDM   Final diagnoses:  Melena  Supratherapeutic INR  Tachycardia  Dyspnea    DDx includes: Esophagitis Mallory Weiss tear [place]  Variceal bleeding PUD/Gastritis/ulcers Diverticular bleed Colon cancer Rectal bleed Internal hemorrhoids External hemorrhoids  Pt on coumadin and frequent nsaid use comes in with cc of melena. Pt also has hx of diverticulosis - last scope by Dr. Watt Climes in 2011.  Pt has no epigastric pain, last BM was at least 3 hours ago. Noted to be tachycardic, and is having dizziness when he gets up with some dib. Pt more comfortable when laying.  Hb is 10.7- drop from 15+ from 2014. INR is 3.99  Filed Vitals:   09/18/13 1645  BP: 99/62  Pulse: 122  Temp:   Resp: 19   During my eval BP is 105/70.  Vit K given. 2 units of FFP ordered, 80 mg iv protonix given 1 liter bolus given (500 cc by EMS, 500 in the ER). 2 large bore iv access  Spoke with GI - they will place patient on the list, no new recs.  Admit to step down. Will need serial HB and close monitoring, and blood - if he becomes symptomatic.  CRITICAL CARE Performed by: Varney Biles   Total critical care time: 60 min  Critical care time was exclusive of separately billable procedures and treating other patients.  Critical care was necessary to treat or prevent imminent or  life-threatening deterioration.  Critical care was time spent personally by me on the following activities: development of treatment plan with patient and/or surrogate as well as nursing, discussions with consultants, evaluation of patient's response to treatment, examination of patient, obtaining history from patient or surrogate, ordering and performing treatments and interventions, ordering and review of laboratory studies, ordering and review of radiographic studies, pulse oximetry and re-evaluation of patient's condition.      Varney Biles, MD 09/18/13 1712

## 2013-09-18 NOTE — ED Notes (Signed)
Pt now c/o jaw pain 5/10.

## 2013-09-19 DIAGNOSIS — E43 Unspecified severe protein-calorie malnutrition: Secondary | ICD-10-CM | POA: Insufficient documentation

## 2013-09-19 LAB — CBC WITH DIFFERENTIAL/PLATELET
BASOS ABS: 0 10*3/uL (ref 0.0–0.1)
BASOS PCT: 1 % (ref 0–1)
EOS PCT: 4 % (ref 0–5)
Eosinophils Absolute: 0.2 10*3/uL (ref 0.0–0.7)
HCT: 23.2 % — ABNORMAL LOW (ref 39.0–52.0)
Hemoglobin: 8.1 g/dL — ABNORMAL LOW (ref 13.0–17.0)
LYMPHS ABS: 1.2 10*3/uL (ref 0.7–4.0)
Lymphocytes Relative: 20 % (ref 12–46)
MCH: 32.8 pg (ref 26.0–34.0)
MCHC: 34.9 g/dL (ref 30.0–36.0)
MCV: 93.9 fL (ref 78.0–100.0)
Monocytes Absolute: 0.5 10*3/uL (ref 0.1–1.0)
Monocytes Relative: 8 % (ref 3–12)
NEUTROS PCT: 67 % (ref 43–77)
Neutro Abs: 3.9 10*3/uL (ref 1.7–7.7)
PLATELETS: 186 10*3/uL (ref 150–400)
RBC: 2.47 MIL/uL — AB (ref 4.22–5.81)
RDW: 13.3 % (ref 11.5–15.5)
WBC: 5.8 10*3/uL (ref 4.0–10.5)

## 2013-09-19 LAB — CBC
HCT: 28.6 % — ABNORMAL LOW (ref 39.0–52.0)
HEMOGLOBIN: 9.9 g/dL — AB (ref 13.0–17.0)
MCH: 32.5 pg (ref 26.0–34.0)
MCHC: 34.6 g/dL (ref 30.0–36.0)
MCV: 93.8 fL (ref 78.0–100.0)
PLATELETS: 194 10*3/uL (ref 150–400)
RBC: 3.05 MIL/uL — AB (ref 4.22–5.81)
RDW: 13.5 % (ref 11.5–15.5)
WBC: 6.8 10*3/uL (ref 4.0–10.5)

## 2013-09-19 LAB — PROTIME-INR
INR: 1.3 (ref 0.00–1.49)
PROTHROMBIN TIME: 16.2 s — AB (ref 11.6–15.2)

## 2013-09-19 LAB — PREPARE FRESH FROZEN PLASMA
UNIT DIVISION: 0
Unit division: 0

## 2013-09-19 LAB — PREPARE RBC (CROSSMATCH)

## 2013-09-19 MED ORDER — FUROSEMIDE 10 MG/ML IJ SOLN
20.0000 mg | Freq: Once | INTRAMUSCULAR | Status: AC
  Administered 2013-09-19: 20 mg via INTRAVENOUS

## 2013-09-19 MED ORDER — PANTOPRAZOLE SODIUM 40 MG PO TBEC
40.0000 mg | DELAYED_RELEASE_TABLET | Freq: Once | ORAL | Status: AC
  Administered 2013-09-19: 40 mg via ORAL
  Filled 2013-09-19: qty 1

## 2013-09-19 MED ORDER — PANTOPRAZOLE SODIUM 40 MG PO TBEC
40.0000 mg | DELAYED_RELEASE_TABLET | Freq: Two times a day (BID) | ORAL | Status: DC
  Administered 2013-09-19 – 2013-09-20 (×2): 40 mg via ORAL
  Filled 2013-09-19 (×3): qty 1

## 2013-09-19 MED ORDER — LORAZEPAM 0.5 MG PO TABS
0.5000 mg | ORAL_TABLET | Freq: Once | ORAL | Status: AC
  Administered 2013-09-19: 0.5 mg via ORAL
  Filled 2013-09-19: qty 1

## 2013-09-19 MED ORDER — DIPHENHYDRAMINE HCL 25 MG PO CAPS
25.0000 mg | ORAL_CAPSULE | Freq: Once | ORAL | Status: AC
  Administered 2013-09-19: 25 mg via ORAL
  Filled 2013-09-19: qty 1

## 2013-09-19 MED ORDER — SODIUM CHLORIDE 0.9 % IV SOLN: INTRAVENOUS | Status: DC

## 2013-09-19 MED ORDER — ALBUTEROL SULFATE (2.5 MG/3ML) 0.083% IN NEBU: 3.0000 mL | INHALATION_SOLUTION | Freq: Three times a day (TID) | RESPIRATORY_TRACT | Status: DC | PRN

## 2013-09-19 NOTE — Progress Notes (Addendum)
Note: This document was prepared with digital dictation and possible smart phrase technology. Any transcriptional errors that result from this process are unintentional.   [name]:[id number] DOB: 1932/04/28 DOA: 09/18/2013 PCP: [name], MD  Brief narrative: 80 y/o ? known h/o lung cancer S/p resectionT1 B. moderately differentiated adenocarcinoma the lung 2 cm in size located in the superior segment of the left lower lobe 10/20/2011. The patient previously had full left thoracotomy many years ago for "cyst". H/o PE in 2005 on Coumadin, Heart block s/p PPM 1991 [place] 2 ppm, NOn OPbst CAD cath 2013, COPD smoker till 2006, hyperparathyroidism, Prostate Ca s/p Cryotherapy, h/o bilateral distal Carotid pseudoaneurysms' admitted from [facility] ED 6/30 with melena and dizzyness   Past medical history-As per Problem list Chart reviewed as below-   Consultants:  [facility]  Procedures:  none  Antibiotics:  none   Subjective  Didn't;t sleep well last night.  Feels poorly and feet are "shaking" No further stool or n/v No cp   Objective    Interim History:   Telemetry:    Objective: Filed Vitals:   09/19/13 0100 09/19/13 0200 09/19/13 0300 09/19/13 0759  BP: 147/60 122/60 117/50 124/61  Pulse: 110 111 105 93  Temp:   98.2 F (36.8 C) 97.9 F (36.6 C)  TempSrc:   Oral Oral  Resp: 15 16 24 15   Height:      Weight:      SpO2: 97% 99% 98% 100%    Intake/Output Summary (Last 24 hours) at 09/19/13 1037 Last data filed at 09/19/13 0848  Gross per 24 hour  Intake   1386 ml  Output   2400 ml  Net  -1014 ml    Exam:  General: eomi, ncat Cardiovascular:  s1 s2 no m/r/g Respiratory: clear, no added sound Abdomen:  Soft, NT/ND Skin no le edema Neuro intact  Data Reviewed: Basic Metabolic Panel:  Recent Labs Lab 09/18/13 1521  NA 139  K 4.0  CL 106  CO2 25  GLUCOSE 89  BUN 51*  CREATININE 0.80  CALCIUM 10.0   Liver Function  Tests:  Recent Labs Lab 09/18/13 1521  AST 15  ALT 10  ALKPHOS 62  BILITOT 0.4  PROT 5.6*  ALBUMIN 3.0*   No results found for this basename: LIPASE, AMYLASE,  in the last 168 hours No results found for this basename: AMMONIA,  in the last 168 hours CBC:  Recent Labs Lab 09/18/13 1521 09/19/13 0855  WBC 9.1 5.8  NEUTROABS  --  3.9  HGB 10.7* 8.1*  HCT 30.7* 23.2*  MCV 93.3 93.9  PLT 228 186   Cardiac Enzymes:  Recent Labs Lab 09/18/13 1521  TROPONINI <0.30   BNP: No components found with this basename: POCBNP,  CBG: No results found for this basename: GLUCAP,  in the last 168 hours  Recent Results (from the past 240 hour(s))  MRSA PCR SCREENING     Status: None   Collection Time    09/18/13  6:06 PM      Result Value Ref Range Status   MRSA by PCR NEGATIVE  NEGATIVE Final   Comment:            The GeneXpert MRSA Assay (FDA     approved for NASAL specimens     only), is one component of a     comprehensive MRSA colonization     surveillance program. It is not     intended to diagnose MRSA  infection nor to guide or     monitor treatment for     MRSA infections.     Studies:              All Imaging reviewed and is as per above notation   Scheduled Meds:  Continuous Infusions:    Assessment/Plan:  1. Hypovolemic shock-patient has acute blood loss anemia likely from upper GI bleed- would need fluids transiently at 150 cc per hour.  Being transfused x 1 unit 6/30 2. History of lung cancer-continue interval surveillance as an outpatient. Completed radiation therapy to left lung 01/03/2013  3. Melena-precipitant likely ibuprofen in addition to chronic anticoagulation which will need to be revisited this hospital stay.   Likely would d/c AC completely as indication was DVT in 2005 4. UGIB (upper gastrointestinal bleed)-eventually will need endoscopy however clinically stable presently  5. Anemia due to blood loss, acute-baseline hemoglobin 15 today it  is 10. We will repeat that counts every 8 hours initially and type and screen. He should be transfused if he Hospital 8.0  6. Complete heart block s/p PPM 1991-Stable  7. Acute uremia-2/2 to UGIB, should resolve. Monitor in am c labs  8. PE (pulmonary embolism) 2005 on Coumadin-he was reversed with vit k and ffp.  Inr was 1.3 9. COPD (chronic obstructive pulmonary disease)-clinically stable  10. Personal history of prostate cancer s/p cryosurg-clinically stable   Code Status: full Family Communication: called daughter [name] on telephone Disposition Plan: inpatient   Verneita Griffes, MD  [facility] Pager [phone] 09/19/2013, 10:37 AM    LOS: 1 day

## 2013-09-19 NOTE — Progress Notes (Signed)
INITIAL NUTRITION ASSESSMENT  DOCUMENTATION CODES Per approved criteria  -Severe malnutrition in the context of chronic illness   INTERVENTION: Advance diet as medically appropriate, add interventions accordingly RD to follow for nutrition care plan  NUTRITION DIAGNOSIS: Inadequate oral intake related to inability to eat as evidenced by NPO status  Goal: Pt to meet >/= 90% of their estimated nutrition needs   Monitor:  PO & supplemental intake, weight, labs, I/O's  Reason for Assessment: Malnutrition Screening Tool Report  80 y.o. male  Admitting Dx: Hypovolemic shock  ASSESSMENT: 80 y.o. Male with known h/o lung cancer, heart block s/p PPM, COPD, smoker, hyperparathyroidism, prostate cancer; presented with dark and tarry stools.  Patient reports his appetite has been decreased x 1 week; was only consuming 1-2 meals per day during this time; also endorses an 8 lb weight loss in ~ 2 weeks (5%) -- severe for time frame; pt drinks Ensure supplements at home; would like during hospitalization once diet advanced; RD to monitor and order when/as able.  Per GI note 6/29, will likely need EGD after INR corrected.  Nutrition Focused Physical Exam:  Subcutaneous Fat:  Orbital Region: N/A Upper Arm Region: moderate-severe Thoracic and Lumbar Region: N/A  Muscle:  Temple Region: moderate Clavicle Bone Region: moderate-severe Clavicle and Acromion Bone Region: moderate-severe Scapular Bone Region: N/A Dorsal Hand: N/A Patellar Region: N/A Anterior Thigh Region: moderate-severe Posterior Calf Region: moderate-severe  Edema: none  Patient meets criteria for severe malnutrition in the context of chronic illness as evidenced by 5% weight loss x 2 weeks and moderate-severe muscle & subcutaneous fat loss.  Height: Ht Readings from Last 1 Encounters:  09/18/13 5\' 11"  (1.803 m)    Weight: Wt Readings from Last 1 Encounters:  09/18/13 157 lb 8 oz (71.442 kg)    Ideal Body  Weight: 172 lb  % Ideal Body Weight: 91%  Wt Readings from Last 10 Encounters:  09/18/13 157 lb 8 oz (71.442 kg)  08/31/13 165 lb (74.844 kg)  06/19/13 165 lb (74.844 kg)  03/10/13 167 lb 12.8 oz (76.114 kg)  03/01/13 166 lb 6.4 oz (75.479 kg)  01/27/13 166 lb 14.4 oz (75.705 kg)  12/27/12 160 lb 6.4 oz (72.757 kg)  12/08/12 164 lb (74.39 kg)  12/06/12 164 lb (74.39 kg)  11/24/12 164 lb (74.39 kg)    Usual Body Weight: 165 lb   % Usual Body Weight: 95%  BMI:  Body mass index is 21.98 kg/(m^2).  Estimated Nutritional Needs: Kcal: 2000-2200 Protein: 100-100 gm Fluid: 2.0-2.2 L  Skin: Intact  Diet Order: NPO  EDUCATION NEEDS: -No education needs identified at this time   Intake/Output Summary (Last 24 hours) at 09/19/13 1109 Last data filed at 09/19/13 0848  Gross per 24 hour  Intake   1386 ml  Output   2400 ml  Net  -1014 ml    Labs:   Recent Labs Lab 09/18/13 1521  NA 139  K 4.0  CL 106  CO2 25  BUN 51*  CREATININE 0.80  CALCIUM 10.0  GLUCOSE 89    Scheduled Meds: . diphenhydrAMINE  25 mg Oral Once  . furosemide  20 mg Intravenous Once  . furosemide  20 mg Intravenous Once    Continuous Infusions:   Past Medical History  Diagnosis Date  . Hyperlipidemia   . Hypertension   . Esophageal reflux   . Diverticulosis   . Rheumatic fever ~ 1944  . Vision loss of left eye     S/P "  cataract OR"; "can see a little; not much"  . Kidney stones   . Hard of hearing, left   . Pulmonary embolism     On Xarelto  . Hypercalcemia   . Arthritis     "knees"  . CAD (coronary artery disease)     nonobstructive by cath 07/22/11 with small to moderate sized diagonal branch with ostial stenosis , medical therapy advised  . AV block     s/p PPM  . COPD (chronic obstructive pulmonary disease)   . Carotid artery aneurysm     Bilateral internal carotid artery aneurysms followed by Dr. Donnetta Hutching  . Shortness of breath     with ambulation  . Constipation   .  Shingles 12/04/2012    right lateral anterior chest  . Radiation 10/7,10/9,01/03/2013    SBRT Left upper lobe lung  . Prostate cancer     s/p cryotherapy  . Lung cancer, primary, with metastasis from lung to other site dx'd 09/2011 & 11/2012    Past Surgical History  Procedure Laterality Date  . Cystoscopy    . Lung surgery  1957    "born w/bleb in [place]  . Insert / replace / remove pacemaker  1991    initial placement  . Insert / replace / remove pacemaker  ~ 2011    "changed out"  . Tonsillectomy and adenoidectomy  1943  . Appendectomy  2000's  . Cholecystectomy  2000's  . Cataract extraction w/ intraocular lens implant  ~ 2008    left eye  . Lithotripsy  ~ 2011    "twice"  . Cardiac catheterization    . Prostate cryoablation  ~ 2008  . Video bronchoscopy  10/20/2011    Procedure: VIDEO BRONCHOSCOPY;  Surgeon: Grace Isaac, MD;  Location: [facility];  Service: Thoracic;  Laterality: N/A;  . Needle core biopsy  12/06/2012    Lung/lul    Arthur Holms, RD, LDN Pager #: [phone] After-Hours Pager #: [phone]

## 2013-09-19 NOTE — H&P (Signed)
[facility] History and Physical  ARSHAN JABS MBT:[id number] DOB: [birth year] DOA: 09/18/2013  Referring physician: ED PCP: [name], MD  Specialists: [facility]  Chief Complaint: GI bleed  HPI: 80 y/o ? known h/o lung cancer  S/p resectionT1 B. moderately differentiated adenocarcinoma the lung 2 cm in size located in the superior segment of the left lower lobe 10/20/2011. The patient previously had full left thoracotomy many years ago for "cyst". H/o PE in 2005 on Coumadin, Heart block s/p PPM 1991 [employer] 2 ppm, NOn OPbst CAD cath 2013, COPD smoker till 2006, hyperparathyroidism, Prostate Ca s/p Cryotherapy, h/o bilateral distal Carotid pseudoaneurysms Is feeling dizzy and unwell over the past he didn't have. Had a dark and tarry stool this morning. Went to primary care physician noted to be melena positive and sent to the emergency room. He states that he takes occasionally Advil from 1-2 tablets a day nobody but has not and does not drink alcohol. He has not had any specific chest pain or shortness of breath but still felt dizzy even after IV saline bolus Gastroenterology was in the room evaluating patient and I was present.  Labs in the emergency room BUN 51, creatinine 0.8 INR 3.99 Hemoglobin 10.1, baseline 15 EKG sinus tachycardia PR interval 0.12 QRS axis 30 ST elevations in V5 and the 2 suggestive of rapid repolarization or LVH.   Review of Systems: The patient denies  Fever Chills Cough Cold Sputum Diarrhea Hematemesis Abdominal pain Dysuria Rash Weakness in one side of the body  Past Medical History  Diagnosis Date  . Hyperlipidemia   . Hypertension   . Esophageal reflux   . Diverticulosis   . Rheumatic fever ~ 1944  . Vision loss of left eye     S/P "cataract OR"; "can see a little; not much"  . Kidney stones   . Hard of hearing, left   . Pulmonary embolism     On Xarelto  . Hypercalcemia   . Arthritis     "knees"  . CAD  (coronary artery disease)     nonobstructive by cath 07/22/11 with small to moderate sized diagonal branch with ostial stenosis , medical therapy advised  . AV block     s/p PPM  . COPD (chronic obstructive pulmonary disease)   . Carotid artery aneurysm     Bilateral internal carotid artery aneurysms followed by Dr. Donnetta Hutching  . Shortness of breath     with ambulation  . Constipation   . Shingles 12/04/2012    right lateral anterior chest  . Radiation 10/7,10/9,01/03/2013    SBRT Left upper lobe lung  . Prostate cancer     s/p cryotherapy  . Lung cancer, primary, with metastasis from lung to other site dx'd 09/2011 & 11/2012   Past Surgical History  Procedure Laterality Date  . Cystoscopy    . Lung surgery  1957    "born w/bleb in [place]  . Insert / replace / remove pacemaker  1991    initial placement  . Insert / replace / remove pacemaker  ~ 2011    "changed out"  . Tonsillectomy and adenoidectomy  1943  . Appendectomy  2000's  . Cholecystectomy  2000's  . Cataract extraction w/ intraocular lens implant  ~ 2008    left eye  . Lithotripsy  ~ 2011    "twice"  . Cardiac catheterization    . Prostate cryoablation  ~ 2008  . Video bronchoscopy  10/20/2011    Procedure: VIDEO  BRONCHOSCOPY;  Surgeon: Grace Isaac, MD;  Location: [facility] OR;  Service: Thoracic;  Laterality: N/A;  . Needle core biopsy  12/06/2012    Lung/lul   Social History:  History   Social History Narrative   2 caffeine drink daily     Allergies  Allergen Reactions  . Iohexol      Desc: PT STATED TODAY HE GETS SLIGHT SOB FROM IV CONTRAST. HE NEEDS FULL PREMEDS FROM NOW ON PER DR. MATTERN   . Penicillins Itching and Rash    "haven't took none in 40 years"    Family History  Problem Relation Age of Onset  . Hypertension Neg Hx   . Hyperlipidemia Neg Hx   . Heart failure Neg Hx   . Heart disease Neg Hx   . Colon cancer Neg Hx   . Breast cancer Mother   . Emphysema Sister     was a smoker  . Brain cancer  Father 72     type unknown     Prior to Admission medications   Medication Sig Start Date End Date Taking? Authorizing Maximillion Gill  albuterol (PROVENTIL,VENTOLIN) 90 MCG/ACT inhaler Inhale 2 puffs into the lungs every 6 (six) hours as needed for shortness of breath. For wheezing or shortness of breath 04/12/11 09/18/13 Yes Nishant Dhungel, MD  DULoxetine (CYMBALTA) 60 MG capsule Take 60 mg by mouth daily.   Yes Historical Nicholai Willette, MD  ibuprofen (ADVIL) 200 MG tablet Take 400 mg by mouth every 6 (six) hours as needed for moderate pain.    Yes Historical Jhayla Podgorski, MD  losartan (COZAAR) 50 MG tablet Take 50 mg by mouth daily.   Yes Historical Carel Schnee, MD  nitroGLYCERIN (NITROSTAT) 0.4 MG SL tablet Place 1 tablet (0.4 mg total) under the tongue every 5 (five) minutes as needed. For chest pain 06/17/12  Yes Thompson Grayer, MD  warfarin (COUMADIN) 5 MG tablet Take 5-7.5 mg by mouth daily. Take 5mg  for 6 days, on the 7th day take 7.5mg    Yes Historical Nyshawn Gowdy, MD   Physical Exam: Filed Vitals:   09/19/13 0100 09/19/13 0200 09/19/13 0300 09/19/13 0759  BP: 147/60 122/60 117/50 124/61  Pulse: 110 111 105 93  Temp:   98.2 F (36.8 C) 97.9 F (36.6 C)  TempSrc:   Oral Oral  Resp: 15 16 24 15   Height:      Weight:      SpO2: 97% 99% 98% 100%     General:  A pleasant oriented hard of hearing  Eyes: EOMI NCAT mild pallor  ENT: Soft supple  Neck: See above  Cardiovascular: S1-S2 tachycardic  Respiratory: Clinically clear no added sounds  Abdomen: Soft nontender no rebound no guarding  Skin: No lower extremity edema  Musculoskeletal: Range of motion intact  Psychiatric: Euthymic doesn't  Neurologic: Grossly intact 5/5 power reflexes deferredFinger-nose-finger normal extraocular movements intact  Smile symmetric   Labs on Admission:  Basic Metabolic Panel:  Recent Labs Lab 09/18/13 1521  NA 139  K 4.0  CL 106  CO2 25  GLUCOSE 89  BUN 51*  CREATININE 0.80  CALCIUM 10.0    Liver Function Tests:  Recent Labs Lab 09/18/13 1521  AST 15  ALT 10  ALKPHOS 62  BILITOT 0.4  PROT 5.6*  ALBUMIN 3.0*   No results found for this basename: LIPASE, AMYLASE,  in the last 168 hours No results found for this basename: AMMONIA,  in the last 168 hours CBC:  Recent Labs Lab 09/18/13 1521 09/19/13  0855  WBC 9.1 5.8  NEUTROABS  --  3.9  HGB 10.7* 8.1*  HCT 30.7* 23.2*  MCV 93.3 93.9  PLT 228 186   Cardiac Enzymes:  Recent Labs Lab 09/18/13 1521  TROPONINI <0.30    BNP (last 3 results) No results found for this basename: PROBNP,  in the last 8760 hours CBG: No results found for this basename: GLUCAP,  in the last 168 hours  Radiological Exams on Admission: No results found.  EKG: Independently reviewed. See above  Assessment/Plan Principal Problem:   Hypovolemic shock-patient has acute blood loss anemia likely from upper GI bleed given the symptomatology and the fact he is on Coumadin. Gastric throat is very seen in. I have requested that he be continued plan of reversing INR 3.99 with FFP as well as vitamin K as has already been ordered. He would need fluids transiently at 150 cc per hour we'll re-evaluate him in the morning to ensure that his hemoglobin does not drop precipitously. See below   History of lung cancer-continue interval surveillance as an outpatient. Completed radiation therapy to left lung 01/03/2013   Melena-precipitant likely ibuprofen in addition to chronic anticoagulation which will need to be revisited this hospital stay.   UGIB (upper gastrointestinal bleed)-eventually will need endoscopy however clinically stable presently   Anemia due to blood loss, acute-baseline hemoglobin 15 today it is 10. We will repeat that counts every 8 hours initially and type and screen. He should be transfused if he Hospital 8.0   Complete heart block s/p PPM 1991-Stable   Acute uremia-2/2 to UGIB,, should resolve.  Monitor in am c labs   PE  (pulmonary embolism) 2005 on Coumadin-he's been reversed right now with vitamin K and FFP. He will need to come off of Coumadin I suspect   COPD (chronic obstructive pulmonary disease)-clinically stable   Personal history of prostate cancer s/p cryosurg-clinically stable  Time spent: 45 minutes No family  at bedside Full [place], [place] [facility] Pager [phone]  If 7PM-7AM, please contact night-coverage [email or web address] Password [facility] 09/19/2013, 10:36 AM

## 2013-09-19 NOTE — Progress Notes (Signed)
[facility] PROGRESS NOTE Subjective No further bleeding no stool since yesterday.  Objective: Vital signs in last 24 hours: Temp:  [97.3 F (36.3 C)-98.8 F (37.1 C)] 97.3 F (36.3 C) (06/30 1304) Pulse Rate:  [87-126] 92 (06/30 1304) Resp:  [13-29] 19 (06/30 1304) BP: (99-160)/(48-82) 123/80 mmHg (06/30 1304) SpO2:  [96 %-100 %] 100 % (06/30 1304) Weight:  [71.442 kg (157 lb 8 oz)] 71.442 kg (157 lb 8 oz) (06/29 1800) Last BM Date: 09/18/13  Intake/Output from previous day: 06/29 0701 - 06/30 0700 In: 9242 [P.O.:480; I.V.:187.5; Blood:718.5] Out: 1300 [Urine:1300] Intake/Output this shift: Total I/O In: -  Out: 2725 [Urine:2725]  PE:  Abdomen--soft nontender  Lab Results:  Recent Labs  09/18/13 1521 09/19/13 0855  WBC 9.1 5.8  HGB 10.7* 8.1*  HCT 30.7* 23.2*  PLT 228 186   BMET  Recent Labs  09/18/13 1521  NA 139  K 4.0  CL 106  CO2 25  CREATININE 0.80   LFT  Recent Labs  09/18/13 1521  PROT 5.6*  AST 15  ALT 10  ALKPHOS 62  BILITOT 0.4   PT/INR  Recent Labs  09/18/13 1521 09/19/13 0855  LABPROT 38.9* 16.2*  INR 3.99* 1.30   PANCREAS No results found for this basename: LIPASE,  in the last 72 hours       Studies/Results: No results found.  Medications: I have reviewed the patient's current medications.  Assessment/Plan: 1. GI Bleed. Probably UGI due to advil and over anticoagulation. Should remain on protonix . Scheduled for EGD at 12 noon tomorrow, discussed with pt.and family.   EDWARDS JR,JAMES L 09/19/2013, 1:39 PM

## 2013-09-20 ENCOUNTER — Encounter (HOSPITAL_COMMUNITY): Payer: Self-pay | Admitting: Gastroenterology

## 2013-09-20 ENCOUNTER — Encounter (HOSPITAL_COMMUNITY)
Admission: EM | Disposition: A | Discharge status: Home / Self Care | Payer: Self-pay | Source: Home / Self Care | Attending: Family Medicine

## 2013-09-20 ENCOUNTER — Encounter (HOSPITAL_COMMUNITY): Payer: Medicare Other | Admitting: Anesthesiology

## 2013-09-20 ENCOUNTER — Inpatient Hospital Stay (HOSPITAL_COMMUNITY): Payer: Medicare Other | Admitting: Anesthesiology

## 2013-09-20 DIAGNOSIS — J438 Other emphysema: Secondary | ICD-10-CM

## 2013-09-20 DIAGNOSIS — G2581 Restless legs syndrome: Secondary | ICD-10-CM

## 2013-09-20 DIAGNOSIS — D62 Acute posthemorrhagic anemia: Secondary | ICD-10-CM

## 2013-09-20 HISTORY — PX: ESOPHAGOGASTRODUODENOSCOPY: SHX5428

## 2013-09-20 LAB — COMPREHENSIVE METABOLIC PANEL
ALT: 22 U/L (ref 0–53)
AST: 39 U/L — AB (ref 0–37)
Albumin: 3.6 g/dL (ref 3.5–5.2)
Alkaline Phosphatase: 75 U/L (ref 39–117)
BILIRUBIN TOTAL: 2 mg/dL — AB (ref 0.3–1.2)
BUN: 23 mg/dL (ref 6–23)
CALCIUM: 10.4 mg/dL (ref 8.4–10.5)
CHLORIDE: 102 meq/L (ref 96–112)
CO2: 26 meq/L (ref 19–32)
Creatinine, Ser: 0.89 mg/dL (ref 0.50–1.35)
GFR, EST NON AFRICAN AMERICAN: 80 mL/min — AB (ref >90)
GLUCOSE: 83 mg/dL (ref 70–99)
Potassium: 3.8 mEq/L (ref 3.7–5.3)
Sodium: 142 mEq/L (ref 137–147)
Total Protein: 6.5 g/dL (ref 6.0–8.3)

## 2013-09-20 LAB — TYPE AND SCREEN
ABO/RH(D): O POS
Antibody Screen: NEGATIVE
UNIT DIVISION: 0

## 2013-09-20 SURGERY — EGD (ESOPHAGOGASTRODUODENOSCOPY)
Anesthesia: Monitor Anesthesia Care

## 2013-09-20 MED ORDER — LACTATED RINGERS IV SOLN
INTRAVENOUS | Status: DC
  Administered 2013-09-20: 1000 mL via INTRAVENOUS

## 2013-09-20 MED ORDER — LOSARTAN POTASSIUM 50 MG PO TABS
50.0000 mg | ORAL_TABLET | Freq: Every day | ORAL | Status: DC
  Administered 2013-09-20: 50 mg via ORAL
  Filled 2013-09-20 (×2): qty 1

## 2013-09-20 MED ORDER — ACETAMINOPHEN 325 MG PO TABS
650.0000 mg | ORAL_TABLET | Freq: Four times a day (QID) | ORAL | Status: DC | PRN
Start: 2013-09-20 — End: 2013-09-21

## 2013-09-20 MED ORDER — ROPINIROLE HCL 1 MG PO TABS
8.0000 mg | ORAL_TABLET | Freq: Every day | ORAL | Status: DC
  Filled 2013-09-20 (×2): qty 8

## 2013-09-20 MED ORDER — ALBUTEROL SULFATE (2.5 MG/3ML) 0.083% IN NEBU: 2.5000 mg | INHALATION_SOLUTION | Freq: Four times a day (QID) | RESPIRATORY_TRACT | Status: DC | PRN

## 2013-09-20 MED ORDER — LACTATED RINGERS IV SOLN
INTRAVENOUS | Status: DC | PRN
  Administered 2013-09-20: 12:00:00 via INTRAVENOUS

## 2013-09-20 MED ORDER — LOSARTAN POTASSIUM 50 MG PO TABS: 50.0000 mg | ORAL_TABLET | Freq: Every day | ORAL | Status: DC

## 2013-09-20 MED ORDER — BUTAMBEN-TETRACAINE-BENZOCAINE 2-2-14 % EX AERO
INHALATION_SPRAY | CUTANEOUS | Status: DC | PRN
  Administered 2013-09-20: 2 via TOPICAL

## 2013-09-20 MED ORDER — PANTOPRAZOLE SODIUM 40 MG PO TBEC: 40.0000 mg | DELAYED_RELEASE_TABLET | Freq: Once | ORAL | Status: DC

## 2013-09-20 MED ORDER — PROPOFOL INFUSION 10 MG/ML OPTIME
INTRAVENOUS | Status: DC | PRN
  Administered 2013-09-20: 50 ug/kg/min via INTRAVENOUS

## 2013-09-20 MED ORDER — HYDROCODONE-ACETAMINOPHEN 5-325 MG PO TABS: 1.0000 | ORAL_TABLET | Freq: Once | ORAL | Status: AC | PRN

## 2013-09-20 MED ORDER — DULOXETINE HCL 60 MG PO CPEP
60.0000 mg | ORAL_CAPSULE | Freq: Every day | ORAL | Status: DC
  Administered 2013-09-20 – 2013-09-21 (×2): 60 mg via ORAL
  Filled 2013-09-20 (×2): qty 1

## 2013-09-20 MED ORDER — ALBUTEROL 90 MCG/ACT IN AERS: 2.0000 | INHALATION_SPRAY | Freq: Four times a day (QID) | RESPIRATORY_TRACT | Status: DC | PRN

## 2013-09-20 MED ORDER — PANTOPRAZOLE SODIUM 40 MG PO TBEC
40.0000 mg | DELAYED_RELEASE_TABLET | Freq: Two times a day (BID) | ORAL | Status: DC
  Administered 2013-09-20 – 2013-09-21 (×2): 40 mg via ORAL
  Filled 2013-09-20 (×2): qty 1

## 2013-09-20 MED ORDER — ROPINIROLE HCL ER 8 MG PO TB24
8.0000 mg | ORAL_TABLET | Freq: Every day | ORAL | Status: DC
  Filled 2013-09-20: qty 1

## 2013-09-20 MED ORDER — ROPINIROLE HCL 0.5 MG PO TABS
0.5000 mg | ORAL_TABLET | Freq: Every day | ORAL | Status: DC
  Administered 2013-09-21: 0.5 mg via ORAL
  Filled 2013-09-20 (×3): qty 1

## 2013-09-20 MED ORDER — NITROGLYCERIN 0.4 MG SL SUBL: 0.4000 mg | SUBLINGUAL_TABLET | SUBLINGUAL | Status: DC | PRN

## 2013-09-20 NOTE — Transfer of Care (Signed)
Immediate Anesthesia Transfer of Care Note  Patient: [name]  Procedure(s) Performed: Procedure(s): ESOPHAGOGASTRODUODENOSCOPY (EGD) (N/A)  Patient Location: Endoscopy Unit  Anesthesia Type:MAC  Level of Consciousness: awake, alert  and oriented  Airway & Oxygen Therapy: Patient Spontanous Breathing  Post-op Assessment: Report given to PACU RN  Post vital signs: Reviewed and stable  Complications: No apparent anesthesia complications

## 2013-09-20 NOTE — Anesthesia Preprocedure Evaluation (Signed)
Anesthesia Evaluation  Patient identified by MRN, date of birth, ID band Patient awake    Reviewed: Allergy & Precautions, H&P , NPO status , Patient's Chart, lab work & pertinent test results  Airway Mallampati: II  Neck ROM: full    Dental   Pulmonary COPDformer smoker, PE On Xarelto for h/o PE         Cardiovascular hypertension, + CAD and + Peripheral Vascular Disease + dysrhythmias + pacemaker  Nonobstructive CAD by cath 07/22/11 with small to moderate sized diagonal branch with ostial stenosis , medical therapy advised   Neuro/Psych    GI/Hepatic GERD-  ,Pt has a GI bleed.   Endo/Other    Renal/GU      Musculoskeletal  (+) Arthritis -,   Abdominal   Peds  Hematology   Anesthesia Other Findings   Reproductive/Obstetrics                           Anesthesia Physical Anesthesia Plan  ASA: III  Anesthesia Plan: MAC   Post-op Pain Management:    Induction: Intravenous  Airway Management Planned: Nasal Cannula  Additional Equipment:   Intra-op Plan:   Post-operative Plan:   Informed Consent: I have reviewed the patients History and Physical, chart, labs and discussed the procedure including the risks, benefits and alternatives for the proposed anesthesia with the patient or authorized representative who has indicated his/her understanding and acceptance.     Plan Discussed with: [name], Anesthesiologist and Surgeon  Anesthesia Plan Comments:         Anesthesia Quick Evaluation

## 2013-09-20 NOTE — Interval H&P Note (Signed)
History and Physical Interval Note:  09/20/2013 12:12 PM  [name]  has presented today for surgery, with the diagnosis of GI bleed  The various methods of treatment have been discussed with the patient and family. After consideration of risks, benefits and other options for treatment, the patient has consented to  Procedure(s): ESOPHAGOGASTRODUODENOSCOPY (EGD) (N/A) as a surgical intervention .  The patient's history has been reviewed, patient examined, no change in status, stable for surgery.  I have reviewed the patient's chart and labs.  Questions were answered to the patient's satisfaction.     Hamdan Toscano JR,Burke Terry L

## 2013-09-20 NOTE — Anesthesia Postprocedure Evaluation (Signed)
  Anesthesia Post-op Note  Patient: [name]  Procedure(s) Performed: Procedure(s): ESOPHAGOGASTRODUODENOSCOPY (EGD) (N/A)  Patient Location: Endoscopy Unit  Anesthesia Type:MAC  Level of Consciousness: awake, alert  and oriented  Airway and Oxygen Therapy: Patient Spontanous Breathing  Post-op Pain: none  Post-op Assessment: Post-op Vital signs reviewed, Patient's Cardiovascular Status Stable, Respiratory Function Stable, Patent Airway and No signs of Nausea or vomiting  Post-op Vital Signs: Reviewed and stable  Last Vitals:  Filed Vitals:   09/20/13 1232  BP: 124/66  Pulse: 87  Temp: 36.4 C  Resp: 15    Complications: No apparent anesthesia complications

## 2013-09-20 NOTE — Progress Notes (Signed)
[facility] Progress Note   [name] DZH:[id number] DOB: [birth year] DOA: 09/18/2013 PCP: [name], MD  Brief narrative: [name] is a 80 y.o. male  presents with tarry black stools in setting of INR of 3.99. He has noted feeling lightheaded for 4-5 days. He takes Advil occasionally. Does not drink heavily. He is on Coumadin for PE in 2005 with no recurrent episodes.  PMH: adenocarcinoma the lung 2013, left sided thoracotomy for "cyst" many yrs ago, pacemake for CHB, COPD, prostate CA s/p cryotherapy, b/l distal carotic pseudoaneurysms.   Subjective: Legs ache- moving legs all night and couldn't sleep - this is a chronic issue but he is not on medication for it.  No further blood BMs since being hospitalized.   Assessment/Plan: Principal Problem:   Hypovolemic shock - due to acute blood loss- corrected with IVF and blood tx  Active Problems:   Melena - UGIB (upper gastrointestinal bleed) - EGD reveals non-bleeding duodenal ulcer - BID PPI - solid diet started by GI after EGD - no further NSAIDs or ETOH for now - no further bleed since being hospitalized    Anemia due to blood loss, acute - given 1 u PRBC - Hgb stable  Coagulopathy--  PE (pulmonary embolism) 2005 on Coumadin - INR 3.99 reversed with 2 U FFp and Vit K - GI to advise on when to resume anticoagulation - of note he was on coumadin for a PE in 2005- do we need to even continue it- will let his PCP decide    Complete heart block s/p PPM 1991    COPD (chronic obstructive pulmonary disease) - stable    Personal history of prostate cancer s/p cryosurg    Protein-calorie malnutrition, severe - resumed diet  RLS - start Requip  Depression - cont Cymbalta  HTN - resume Cozaar today- BP rising    Code Status: do not intubate Family Communication: none Disposition Plan: home  Consultants: GI  Procedures: none  Antibiotics: Anti-infectives   None       DVT  prophylaxis: SCDs  Objective: Filed Weights   09/18/13 1800  Weight: 71.442 kg (157 lb 8 oz)    Vitals Filed Vitals:   09/19/13 1700 09/19/13 2000 09/20/13 0000 09/20/13 0400  BP: 121/71 127/72 119/64 129/71  Pulse: 98 96 89 95  Temp:  98 F (36.7 C) 97.8 F (36.6 C) 97.6 F (36.4 C)  TempSrc:  Oral Oral Oral  Resp: 18 22 16 16   Height:      Weight:      SpO2: 100% 98% 99% 100%      Intake/Output Summary (Last 24 hours) at 09/20/13 0838 Last data filed at 09/20/13 0500  Gross per 24 hour  Intake    300 ml  Output   4550 ml  Net  -4250 ml     Exam: General: AAO x 3, No acute respiratory distress Lungs: Clear to auscultation bilaterally without wheezes or crackles Cardiovascular: Regular rate and rhythm without murmur gallop or rub normal S1 and S2 Abdomen: Nontender, nondistended, soft, bowel sounds positive, no rebound, no ascites, no appreciable mass Extremities: No significant cyanosis, clubbing, or edema bilateral lower extremities  Data Reviewed: Basic Metabolic Panel:  Recent Labs Lab 09/18/13 1521 09/20/13 0413  NA 139 142  K 4.0 3.8  CL 106 102  CO2 25 26  GLUCOSE 89 83  BUN 51* 23  CREATININE 0.80 0.89  CALCIUM 10.0 10.4   Liver Function Tests:  Recent Labs  Lab 09/18/13 1521 09/20/13 0413  AST 15 39*  ALT 10 22  ALKPHOS 62 75  BILITOT 0.4 2.0*  PROT 5.6* 6.5  ALBUMIN 3.0* 3.6   No results found for this basename: LIPASE, AMYLASE,  in the last 168 hours No results found for this basename: AMMONIA,  in the last 168 hours CBC:  Recent Labs Lab 09/18/13 1521 09/19/13 0855 09/19/13 1502  WBC 9.1 5.8 6.8  NEUTROABS  --  3.9  --   HGB 10.7* 8.1* 9.9*  HCT 30.7* 23.2* 28.6*  MCV 93.3 93.9 93.8  PLT 228 186 194   Cardiac Enzymes:  Recent Labs Lab 09/18/13 1521  TROPONINI <0.30   BNP (last 3 results) No results found for this basename: PROBNP,  in the last 8760 hours CBG: No results found for this basename: GLUCAP,  in  the last 168 hours  Recent Results (from the past 240 hour(s))  MRSA PCR SCREENING     Status: None   Collection Time    09/18/13  6:06 PM      Result Value Ref Range Status   MRSA by PCR NEGATIVE  NEGATIVE Final   Comment:            The GeneXpert MRSA Assay (FDA     approved for NASAL specimens     only), is one component of a     comprehensive MRSA colonization     surveillance program. It is not     intended to diagnose MRSA     infection nor to guide or     monitor treatment for     MRSA infections.     Studies:  Recent x-ray studies have been reviewed in detail by the Attending Physician  Scheduled Meds:  Scheduled Meds: . DULoxetine  60 mg Oral Daily  . pantoprazole  40 mg Oral BID AC  . rOPINIRole  8 mg Oral QHS   Continuous Infusions: . sodium chloride 20 mL/hr (09/19/13 1729)    Time spent on care of this patient: > 35 min   Widener, MD 09/20/2013, 8:38 AM  LOS: 2 days   [facility] Office  [phone] Pager - Text Page per Shea Evans   If 7PM-7AM, please contact night-coverage [email or web address]

## 2013-09-20 NOTE — Op Note (Signed)
[facility] [place], [id number]   ENDOSCOPY PROCEDURE REPORT  PATIENT: [name], [name]  MR#: [id number] BIRTHDATE: [birth year] , 80  yrs. old GENDER: Male ENDOSCOPIST:Gerardo Caiazzo Oletta Lamas, MD REFERRED BY:  [facility] PCP: Dr. Claris Gower PROCEDURE DATE:  09/20/2013 PROCEDURE:   EGD with biopsy ASA CLASS:   class IV INDICATIONS:   G.I. bleeding and gentlemen who has been taking some BC powders and Advil. He has been on Coumadin for history of pulmonary him a lot in the past and has a previous history of lung resection for lung cancer with subsequent recurrence treated with radiofrequency MEDICATION:    MAC. Propofol 100 mg TOPICAL ANESTHETIC:    cetacaine spray  DESCRIPTION OF PROCEDURE:   The procedure had been explained to the patient and consent obtained. The patient was sedated by anesthesia and the Pentax adult scope is inserted into the esophagus. The esophagus was normal and there was no active bleeding. There was a well-defined GE junction in a very small hiatal hernia. The stomach was entered there is no active bleeding. There was mild streaky gastritis in the antrum biopsies for H pylori. The duodenum was entered and there were some coffee ground material and on the anterior wall of the duodenal bulb near the junction  was shallow ulceration with small visible vessel with no active bleeding. The area was vigorously her gated and there was no sign of bleeding. The 2nd duodenum was normal. The scope was withdrawn in the initial findings were confirmed.     COMPLICATIONS: None  ENDOSCOPIC IMPRESSION: 1. Duodenal Ulcer. Clearly the source of his bleeding. Probably exacerbated by Coumadin. I suspect this is result of NSAID use but was biopsies for possible H pylori.  RECOMMENDATIONS: we will advance diet and keep on PPI for now. Withhold Coumadin for 5 to 7 days and then start back  gradually.    _______________________________ Lorrin MaisLaurence Spates, MD 09/20/2013 12:39 PM  CC: Dr. Claris Gower   PATIENT NAME:  [name], [name] MR#: [id number]

## 2013-09-20 NOTE — H&P (View-Only) (Signed)
[name] [name] Reason for [name]: G.I. bleeding. Referring Physician: ED. PCP: Dr. Arelia Sneddon. Primary G.I.: [name] [name] is an 80 y.o. male.  HPI: he has a history of lung cancer with previous wedge resection 2013 and stereotactic radiotherapy to left upper lobe lesion 10/14. Had biopsies positive for [name] and followed by Dr. Servando Snare. This had anticoagulation for 15 years with Coumadin followed now by Dr. Rayann Heman previously by Dr. Maurene Capes. The patient is developed painless melena started yesterday. He had dark are stools and felt somewhat dizzy and nauseous. Was seen by EMS and pulse was 130 and he was given Zofran and normal saline bolus. He does not take regular medications for acid and has not had problems with ulcers. He had surveillance colonoscopy by Dr. Watt Climes 2010 it was negative. Has had previous colon polyps removed. He notes that he takes Advil nearly every night 1 or 2 tablets. No  chronic heartburn and indigestion.  Past Medical History  Diagnosis Date  . Hyperlipidemia   . Hypertension   . Esophageal reflux   . Diverticulosis   . Rheumatic fever ~ 1944  . Vision loss of left eye     S/P "cataract OR"; "can see a little; not much"  . Kidney stones   . Hard of hearing, left   . Pulmonary embolism     On Xarelto  . Hypercalcemia   . Arthritis     "knees"  . CAD (coronary artery disease)     nonobstructive by cath 07/22/11 with small to moderate sized diagonal branch with ostial stenosis , medical therapy advised  . AV block     s/p PPM  . COPD (chronic obstructive pulmonary disease)   . Carotid artery aneurysm     Bilateral internal carotid artery aneurysms followed by Dr. Donnetta Hutching  . Shortness of breath     with ambulation  . Constipation   . Shingles 12/04/2012    right lateral anterior chest  . Radiation 10/7,10/9,01/03/2013    SBRT Left upper lobe lung  . Prostate cancer     s/p cryotherapy  . Lung cancer, primary, with metastasis from  lung to other site dx'd 09/2011 & 11/2012    Past Surgical History  Procedure Laterality Date  . Cystoscopy    . Lung surgery  1957    "born w/bleb in [place]  . Insert / replace / remove pacemaker  1991    initial placement  . Insert / replace / remove pacemaker  ~ 2011    "changed out"  . Tonsillectomy and adenoidectomy  1943  . Appendectomy  2000's  . Cholecystectomy  2000's  . Cataract extraction w/ intraocular lens implant  ~ 2008    left eye  . Lithotripsy  ~ 2011    "twice"  . Cardiac catheterization    . Prostate cryoablation  ~ 2008  . Video bronchoscopy  10/20/2011    Procedure: VIDEO BRONCHOSCOPY;  Surgeon: Grace Isaac, MD;  Location: [facility] OR;  Service: Thoracic;  Laterality: N/A;  . Needle core biopsy  12/06/2012    Lung/lul    Family History  Problem Relation Age of Onset  . Hypertension Neg Hx   . Hyperlipidemia Neg Hx   . Heart failure Neg Hx   . Heart disease Neg Hx   . Colon cancer Neg Hx   . Breast cancer Mother   . Emphysema Sister     was a smoker  . Brain cancer Father 24  type unknown    Social History:  reports that he quit smoking about 9 years ago. His smoking use included Cigarettes. He has a 50 pack-year smoking history. His smokeless tobacco use includes Chew. He reports that he does not drink alcohol or use illicit drugs.  Allergies:  Allergies  Allergen Reactions  . Iohexol      Desc: PT STATED TODAY HE GETS SLIGHT SOB FROM IV CONTRAST. HE NEEDS FULL PREMEDS FROM NOW ON PER DR. MATTERN   . Penicillins Itching and Rash    "haven't took none in 40 years"    Medications; Prior to Admission medications   Medication Sig Start Date End Date Taking? Authorizing Provider  albuterol (PROVENTIL,VENTOLIN) 90 MCG/ACT inhaler Inhale 2 puffs into the lungs every 6 (six) hours as needed for shortness of breath. For wheezing or shortness of breath 04/12/11 09/18/13 Yes Nishant Dhungel, MD  DULoxetine (CYMBALTA) 60 MG capsule Take 60 mg by mouth  daily.   Yes Historical Provider, MD  ibuprofen (ADVIL) 200 MG tablet Take 400 mg by mouth every 6 (six) hours as needed for moderate pain.    Yes Historical Provider, MD  losartan (COZAAR) 50 MG tablet Take 50 mg by mouth daily.   Yes Historical Provider, MD  nitroGLYCERIN (NITROSTAT) 0.4 MG SL tablet Place 1 tablet (0.4 mg total) under the tongue every 5 (five) minutes as needed. For chest pain 06/17/12  Yes Thompson Grayer, MD  warfarin (COUMADIN) 5 MG tablet Take 5-7.5 mg by mouth daily. Take 20m for 6 days, on the 7th day take 7.546m  Yes Historical Provider, MD     PRN Meds  Results for orders placed during the hospital encounter of 09/18/13 (from the past 48 hour(s))  CBC     Status: Abnormal   Collection Time    09/18/13  3:21 PM      Result Value Ref Range   WBC 9.1  4.0 - 10.5 K/uL   RBC 3.29 (*) 4.22 - 5.81 MIL/uL   Hemoglobin 10.7 (*) 13.0 - 17.0 g/dL   HCT 30.7 (*) 39.0 - 52.0 %   MCV 93.3  78.0 - 100.0 fL   MCH 32.5  26.0 - 34.0 pg   MCHC 34.9  30.0 - 36.0 g/dL   RDW 13.6  11.5 - 15.5 %   Platelets 228  150 - 400 K/uL  COMPREHENSIVE METABOLIC PANEL     Status: Abnormal   Collection Time    09/18/13  3:21 PM      Result Value Ref Range   Sodium 139  137 - 147 mEq/L   Potassium 4.0  3.7 - 5.3 mEq/L   Chloride 106  96 - 112 mEq/L   CO2 25  19 - 32 mEq/L   Glucose, Bld 89  70 - 99 mg/dL   BUN 51 (*) 6 - 23 mg/dL   Creatinine, Ser 0.80  0.50 - 1.35 mg/dL   Calcium 10.0  8.4 - 10.5 mg/dL   Total Protein 5.6 (*) 6.0 - 8.3 g/dL   Albumin 3.0 (*) 3.5 - 5.2 g/dL   AST 15  0 - 37 U/L   ALT 10  0 - 53 U/L   Alkaline Phosphatase 62  39 - 117 U/L   Total Bilirubin 0.4  0.3 - 1.2 mg/dL   GFR calc non Af Amer 83 (*) >90 mL/min   GFR calc Af Amer >90  >90 mL/min   Comment: (NOTE)     The eGFR has  been calculated using the CKD EPI equation.     This calculation has not been validated in all clinical situations.     eGFR's persistently <90 mL/min signify possible Chronic Kidney      Disease.  TYPE AND SCREEN     Status: None   Collection Time    09/18/13  3:21 PM      Result Value Ref Range   ABO/RH(D) O POS     Antibody Screen NEG     Sample Expiration 09/21/2013    PROTIME-INR     Status: Abnormal   Collection Time    09/18/13  3:21 PM      Result Value Ref Range   Prothrombin Time 38.9 (*) 11.6 - 15.2 seconds   INR 3.99 (*) 0.00 - 1.49  TROPONIN I     Status: None   Collection Time    09/18/13  3:21 PM      Result Value Ref Range   Troponin I <0.30  <0.30 ng/mL   Comment:            Due to the release kinetics of cTnI,     a negative result within the first hours     of the onset of symptoms does not rule out     myocardial infarction with certainty.     If myocardial infarction is still suspected,     repeat the test at appropriate intervals.  POC OCCULT BLOOD, ED     Status: Abnormal   Collection Time    09/18/13  3:54 PM      Result Value Ref Range   Fecal Occult Bld POSITIVE (*) NEGATIVE    No results found.             Blood pressure 99/62, pulse 122, temperature 97.8 F (36.6 C), temperature source Oral, resp. rate 19, SpO2 100.00%.  Physical exam:   General-- thin appearing white male in no acute distress  Heart-- regular rate and rhythm Lungs--clear Abdomen-- soft and completely nontender   Assessment: 1. G.I. bleeding. Probably due to the patient's increased INR which is almost 4. He does take Advil sauce suspect that he may have NSAIDs induced gastropathy . He appears to be stable at this time.  2. Lung cancer with prior wedge per section subsequent metastatic lesion  3. History of prostate cancer status pose cryotherapy  4. History of pulmonary embolism on chronic anticoagulation  5. CAD  6. COPD  7. History of colon polyps. last colonoscopy 2010 negative   Plan: 1. Would go ahead and try to correct this coagulopathy. 2. Would empirically get PPI 3. Would people clear liquids for now. He will likely need EGD  after INR has been corrected.   EDWARDS JR,JAMES L 09/18/2013, 5:04 PM

## 2013-09-21 ENCOUNTER — Encounter (HOSPITAL_COMMUNITY): Payer: Self-pay | Admitting: Gastroenterology

## 2013-09-21 LAB — CBC
HCT: 26.4 % — ABNORMAL LOW (ref 39.0–52.0)
Hemoglobin: 9.2 g/dL — ABNORMAL LOW (ref 13.0–17.0)
MCH: 31.9 pg (ref 26.0–34.0)
MCHC: 34.8 g/dL (ref 30.0–36.0)
MCV: 91.7 fL (ref 78.0–100.0)
Platelets: 229 10*3/uL (ref 150–400)
RBC: 2.88 MIL/uL — ABNORMAL LOW (ref 4.22–5.81)
RDW: 13.3 % (ref 11.5–15.5)
WBC: 7.5 10*3/uL (ref 4.0–10.5)

## 2013-09-21 LAB — BASIC METABOLIC PANEL
ANION GAP: 10 (ref 5–15)
BUN: 22 mg/dL (ref 6–23)
CHLORIDE: 102 meq/L (ref 96–112)
CO2: 27 mEq/L (ref 19–32)
Calcium: 10.4 mg/dL (ref 8.4–10.5)
Creatinine, Ser: 0.88 mg/dL (ref 0.50–1.35)
GFR, EST NON AFRICAN AMERICAN: 80 mL/min — AB (ref >90)
Glucose, Bld: 100 mg/dL — ABNORMAL HIGH (ref 70–99)
Potassium: 3.9 mEq/L (ref 3.7–5.3)
Sodium: 139 mEq/L (ref 137–147)

## 2013-09-21 MED ORDER — PANTOPRAZOLE SODIUM 40 MG PO TBEC: 40.0000 mg | DELAYED_RELEASE_TABLET | Freq: Two times a day (BID) | ORAL | Status: DC

## 2013-09-21 MED ORDER — ROPINIROLE HCL 0.5 MG PO TABS: 0.5000 mg | ORAL_TABLET | Freq: Every day | ORAL | Status: DC

## 2013-09-21 MED ORDER — ACETAMINOPHEN 325 MG PO TABS: 650.0000 mg | ORAL_TABLET | Freq: Four times a day (QID) | ORAL | Status: DC | PRN

## 2013-09-21 MED ORDER — WARFARIN SODIUM 5 MG PO TABS: 5.0000 mg | ORAL_TABLET | Freq: Every day | ORAL | Status: DC

## 2013-09-21 NOTE — Discharge Summary (Signed)
Physician Discharge Summary  [name]:[id number] DOB: [birth year] DOA: 09/18/2013  PCP: [name], MD  Admit date: 09/18/2013 Discharge date: 09/21/2013  Time spent: >45 minutes  Recommendations for Outpatient Follow-up:  1. Re-order Cozaar when appropriate 2. Re-assess need for Coumadin - PE was in 2005 around the time of his Lung CA with no recurrence, no h/o A-fib 3. GI to f/u on H pylori  Discharge Diagnoses:  Principal Problem:   Hypovolemic shock Active Problems:   Lung cancer, upper lobe Left   Melena   UGIB (upper gastrointestinal bleed)   Anemia due to blood loss, acute   Complete heart block s/p PPM 1991   Acute uremia   PE (pulmonary embolism) 2005 on Coumadin   COPD (chronic obstructive pulmonary disease)   Personal history of prostate cancer s/p cryosurg   Protein-calorie malnutrition, severe   RLS (restless legs syndrome)   Discharge Condition: stable  Diet recommendation: heart healthy- limit caffeine and spicy foods  Filed Weights   09/18/13 1800 09/20/13 2138  Weight: 71.442 kg (157 lb 8 oz) 67.8 kg (149 lb 7.6 oz)    History of present illness:  [name] is a 80 y.o. male presents with tarry black stools in setting of INR of 3.99. He has noted feeling lightheaded for 4-5 days. He takes Advil and BC powders occasionally. Does not drink heavily. He is on Coumadin for PE in 2005 with no recurrent episodes.  PMH: adenocarcinoma the lung 2013, left sided thoracotomy for "cyst" many yrs ago, pacemake for CHB, COPD, prostate CA s/p cryotherapy, b/l distal carotic pseudoaneurysms.    Hospital Course:  Principal Problem:  Hypovolemic shock/ hypotension  - due to acute blood loss- corrected with IVF and blood tx  - cont to hold Losartan due to low BP- to be resumed by PCP - he was feeling quite lightheaded yesterday but this has resolved today - he has been ambulating with out trouble  Active Problems:  Melena - UGIB (upper  gastrointestinal bleed)  - EGD reveals non-bleeding duodenal ulcer  - BID PPI until he sees GI in the office (per Dr Oletta Lamas)--- f/u with Dr Oletta Lamas in 2 wks- f/u on H pylori - solid diet started by GI after EGD - tolerating well- no further bleeding and no further drop in Hgb - no further NSAIDs or ETOH for now - d/w patient  Anemia due to blood loss, acute  - given 1 u PRBC  - Hgb stable   Coagulopathy-- PE (pulmonary embolism) 2005 on Coumadin  - INR 3.99 reversed with 2 U FFp and Vit K  - GI to advised to resume anticoagulation in 1 wk - of note he was on coumadin for a PE in 2005- can his Coumadin be discontinued by PCP?   Complete heart block s/p PPM 1991   COPD (chronic obstructive pulmonary disease)  - stable   Protein-calorie malnutrition, severe  - resumed diet   RLS  - started Requip - he states he has not been able to sleep in 6 mo due to his leg symptoms and he had a wonderful sleep last night- will cont Requip  Depression  - cont Cymbalta   HTN  - resume Cozaar when appropriate  Procedures:  EGD 7/2  Consultations:  GI  Discharge Exam: Filed Vitals:   09/21/13 1023  BP: 93/55  Pulse: 94  Temp:   Resp:     General: AAO x 3, very hard of hearing Cardiovascular: RRR, no murmurs  Respiratory: CTA b/l   Discharge Instructions You were cared for by a hospitalist during your hospital stay. If you have any questions about your discharge medications or the care you received while you were in the hospital after you are discharged, you can call the unit and asked to speak with the hospitalist on call if the hospitalist that took care of you is not available. Once you are discharged, your primary care physician will handle any further medical issues. Please note that NO REFILLS for any discharge medications will be authorized once you are discharged, as it is imperative that you return to your primary care physician (or establish a relationship with a primary  care physician if you do not have one) for your aftercare needs so that they can reassess your need for medications and monitor your lab values.      Discharge Instructions   Diet - low sodium heart healthy    Complete by:  As directed   Limit caffeine. No alcohol or spicy food.     Increase activity slowly    Complete by:  As directed             Medication List    STOP taking these medications       ADVIL 200 MG tablet  Generic drug:  ibuprofen     losartan 50 MG tablet  Commonly known as:  COZAAR      TAKE these medications       acetaminophen 325 MG tablet  Commonly known as:  TYLENOL  Take 2 tablets (650 mg total) by mouth every 6 (six) hours as needed for mild pain, fever or headache.     albuterol 90 MCG/ACT inhaler  Commonly known as:  PROVENTIL,VENTOLIN  Inhale 2 puffs into the lungs every 6 (six) hours as needed for shortness of breath. For wheezing or shortness of breath     DULoxetine 60 MG capsule  Commonly known as:  CYMBALTA  Take 60 mg by mouth daily.     nitroGLYCERIN 0.4 MG SL tablet  Commonly known as:  NITROSTAT  Place 1 tablet (0.4 mg total) under the tongue every 5 (five) minutes as needed. For chest pain     pantoprazole 40 MG tablet  Commonly known as:  PROTONIX  Take 1 tablet (40 mg total) by mouth 2 (two) times daily before a meal.     rOPINIRole 0.5 MG tablet  Commonly known as:  REQUIP  Take 1 tablet (0.5 mg total) by mouth at bedtime.     warfarin 5 MG tablet  Commonly known as:  COUMADIN  - Take 1-1.5 tablets (5-7.5 mg total) by mouth daily. Take 5mg  for 6 days, on the 7th day take 7.5mg   - Restart in 7 days       Allergies  Allergen Reactions  . Iohexol      Desc: PT STATED TODAY HE GETS SLIGHT SOB FROM IV CONTRAST. HE NEEDS FULL PREMEDS FROM NOW ON PER DR. MATTERN   . Penicillins Itching and Rash    "haven't took none in 40 years"   Follow-up Information   Follow up with [name], MD. Schedule an  appointment as soon as possible for a visit on 09/29/2013. (Appointment with Dr. Arelia Sneddon will be on 09/29/13 at 09:15am)    Specialty:  [facility]   Contact information:   [place] [address] [phone]       Follow up with EDWARDS Dahlia Client, MD. (Appointment with  Dr. Oletta Lamas is on 10/11/13 at 08:15)    Specialty:  Gastroenterology   Contact information:   [address] [place] [address] [phone]        The results of significant diagnostics from this hospitalization (including imaging, microbiology, ancillary and laboratory) are listed below for reference.    Significant Diagnostic Studies: Ct Chest Wo Contrast  08/28/2013   CLINICAL DATA:  Followup lung cancer  EXAM: CT CHEST WITHOUT CONTRAST  TECHNIQUE: Multidetector CT imaging of the chest was performed following the standard protocol without IV contrast.  COMPARISON:  06/16/2013  FINDINGS: No pleural effusion identified. Moderate to advanced changes of centrilobular emphysema identified. Pulmonary nodule within the right lower lobe is stable measuring 8 mm, image 44/series 5. Pulmonary nodule within the left lower lobe is stable measuring 5 mm, image 36/series 5. Peripheral scar like densities within the left lower lobe appear stable, image 31/series 5. Stable appearance of left upper lobe posterior suture line.  The heart size appears normal. There is no pericardial effusion. No mediastinal or hilar adenopathy. Calcified atherosclerotic disease involves the thoracic aorta.  Incidental imaging through the upper abdomen is significant for bilateral renal cysts. The adrenal glands appear normal. Patient is status post cholecystectomy.  Review of the visualized bony structures is negative for aggressive lytic or sclerotic bone lesion.  IMPRESSION: 1. Stable CT of the chest. 2. No change in bilateral pulmonary nodules. 3. Atherosclerotic disease. 4. Emphysema.   Electronically Signed   By: Kerby Moors M.D.   On: 08/28/2013 14:27    Microbiology: Recent Results (from the past 240 hour(s))  MRSA PCR SCREENING     Status: None   Collection Time    09/18/13  6:06 PM      Result Value Ref Range Status   MRSA by PCR NEGATIVE  NEGATIVE Final   Comment:            The GeneXpert MRSA Assay (FDA     approved for NASAL specimens     only), is one component of a     comprehensive MRSA colonization     surveillance program. It is not     intended to diagnose MRSA     infection nor to guide or     monitor treatment for     MRSA infections.     Labs: Basic Metabolic Panel:  Recent Labs Lab 09/18/13 1521 09/20/13 0413 09/21/13 0435  NA 139 142 139  K 4.0 3.8 3.9  CL 106 102 102  CO2 25 26 27   GLUCOSE 89 83 100*  BUN 51* 23 22  CREATININE 0.80 0.89 0.88  CALCIUM 10.0 10.4 10.4   Liver Function Tests:  Recent Labs Lab 09/18/13 1521 09/20/13 0413  AST 15 39*  ALT 10 22  ALKPHOS 62 75  BILITOT 0.4 2.0*  PROT 5.6* 6.5  ALBUMIN 3.0* 3.6   No results found for this basename: LIPASE, AMYLASE,  in the last 168 hours No results found for this basename: AMMONIA,  in the last 168 hours CBC:  Recent Labs Lab 09/18/13 1521 09/19/13 0855 09/19/13 1502 09/21/13 0435  WBC 9.1 5.8 6.8 7.5  NEUTROABS  --  3.9  --   --   HGB 10.7* 8.1* 9.9* 9.2*  HCT 30.7* 23.2* 28.6* 26.4*  MCV 93.3 93.9 93.8 91.7  PLT 228 186 194 229   Cardiac Enzymes:  Recent Labs Lab 09/18/13 1521  TROPONINI <0.30   BNP: BNP (last 3  results) No results found for this basename: PROBNP,  in the last 8760 hours CBG: No results found for this basename: GLUCAP,  in the last 168 hours     Signed:  Debbe Odea, MD [facility] 09/21/2013, 11:05 AM

## 2013-09-21 NOTE — Progress Notes (Signed)
Utilization review completed.  

## 2013-09-21 NOTE — Progress Notes (Signed)
Patient discharge teaching given, including activity, diet, follow-up appoints, and medications. Patient verbalized understanding of all discharge instructions. IV access was d/c'd. Vitals are stable. Skin is intact except as charted in most recent assessments. Pt to be escorted out by NT, to be driven home by family. 

## 2013-09-21 NOTE — Discharge Instructions (Signed)
You will need to avoid Ibuprofen, Naproxen/ Naprosyn, BC powders Your doctor will resume your BP medication when BP becomes elevated- currently it is low   Peptic Ulcer A peptic ulcer is a sore in the lining of in your esophagus (esophageal ulcer), stomach (gastric ulcer), or in the first part of your small intestine (duodenal ulcer). The ulcer causes erosion into the deeper tissue. CAUSES  Normally, the lining of the stomach and the small intestine protects itself from the acid that digests food. The protective lining can be damaged by:  An infection caused by a bacterium called Helicobacter pylori (H. pylori).  Regular use of nonsteroidal anti-inflammatory drugs (NSAIDs), such as ibuprofen or aspirin.  Smoking tobacco. Other risk factors include being older than 93, drinking alcohol excessively, and having a family history of ulcer disease.  SYMPTOMS   Burning pain or gnawing in the area between the chest and the belly button.  Heartburn.  Nausea and vomiting.  Bloating. The pain can be worse on an empty stomach and at night. If the ulcer results in bleeding, it can cause:  Black, tarry stools.  Vomiting of bright red blood.  Vomiting of coffee ground looking materials. DIAGNOSIS  A diagnosis is usually made based upon your history and an exam. Other tests and procedures may be performed to find the cause of the ulcer. Finding a cause will help determine the best treatment. Tests and procedures may include:  Blood tests, stool tests, or breath tests to check for the bacterium H. pylori.  An upper gastrointestinal (GI) series of the esophagus, stomach, and small intestine.  An endoscopy to examine the esophagus, stomach, and small intestine.  A biopsy. TREATMENT  Treatment may include:  Eliminating the cause of the ulcer, such as smoking, NSAIDs, or alcohol.  Medicines to reduce the amount of acid in your digestive tract.  Antibiotic medicines if the ulcer is caused by  the H. pylori bacterium.  An upper endoscopy to treat a bleeding ulcer.  Surgery if the bleeding is severe or if the ulcer created a hole somewhere in the digestive system. HOME CARE INSTRUCTIONS   Avoid tobacco, alcohol, and caffeine. Smoking can increase the acid in the stomach, and continued smoking will impair the healing of ulcers.  Avoid foods and drinks that seem to cause discomfort or aggravate your ulcer.  Only take medicines as directed by your caregiver. Do not substitute over-the-counter medicines for prescription medicines without talking to your caregiver.  Keep any follow-up appointments and tests as directed. SEEK MEDICAL CARE IF:   Your do not improve within 7 days of starting treatment.  You have ongoing indigestion or heartburn. SEEK IMMEDIATE MEDICAL CARE IF:   You have sudden, sharp, or persistent abdominal pain.  You have bloody or dark black, tarry stools.  You vomit blood or vomit that looks like coffee grounds.  You become light headed, weak, or feel faint.  You become sweaty or clammy. MAKE SURE YOU:   Understand these instructions.  Will watch your condition.  Will get help right away if you are not doing well or get worse. Document Released: 03/06/2000 Document Revised: 12/02/2011 Document Reviewed: 10/07/2011 [facility] Patient Information 2015 [place], [place]. This information is not intended to replace advice given to you by your health care provider. Make sure you discuss any questions you have with your health care provider.

## 2013-10-05 ENCOUNTER — Ambulatory Visit: Payer: Medicare Other | Admitting: Radiation Oncology

## 2013-10-09 ENCOUNTER — Other Ambulatory Visit: Payer: Self-pay

## 2013-10-09 ENCOUNTER — Ambulatory Visit (INDEPENDENT_AMBULATORY_CARE_PROVIDER_SITE_OTHER): Payer: Medicare Other | Admitting: Internal Medicine

## 2013-10-09 ENCOUNTER — Encounter: Payer: Self-pay | Admitting: Internal Medicine

## 2013-10-09 VITALS — BP 154/90 | HR 107 | Ht 71.0 in | Wt 157.1 lb

## 2013-10-09 DIAGNOSIS — I442 Atrioventricular block, complete: Secondary | ICD-10-CM

## 2013-10-09 DIAGNOSIS — I441 Atrioventricular block, second degree: Secondary | ICD-10-CM

## 2013-10-09 DIAGNOSIS — I10 Essential (primary) hypertension: Secondary | ICD-10-CM

## 2013-10-09 DIAGNOSIS — I2699 Other pulmonary embolism without acute cor pulmonale: Secondary | ICD-10-CM

## 2013-10-09 DIAGNOSIS — R079 Chest pain, unspecified: Secondary | ICD-10-CM

## 2013-10-09 LAB — MDC_IDC_ENUM_SESS_TYPE_INCLINIC
Battery Remaining Longevity: 121.2 mo
Battery Voltage: 2.95 V
Brady Statistic RA Percent Paced: 0.48 %
Brady Statistic RV Percent Paced: 0.11 %
Implantable Pulse Generator Model: 2210
Implantable Pulse Generator Serial Number: 7167612
Lead Channel Impedance Value: 287.5 Ohm
Lead Channel Pacing Threshold Amplitude: 1.25 V
Lead Channel Pacing Threshold Amplitude: 1.25 V
Lead Channel Pacing Threshold Pulse Width: 1 ms
Lead Channel Pacing Threshold Pulse Width: 1 ms
Lead Channel Pacing Threshold Pulse Width: 1 ms
Lead Channel Sensing Intrinsic Amplitude: 4.2 mV
Lead Channel Setting Pacing Pulse Width: 1 ms
MDC IDC MSMT LEADCHNL RA IMPEDANCE VALUE: 412.5 Ohm
MDC IDC MSMT LEADCHNL RV PACING THRESHOLD AMPLITUDE: 1.75 V
MDC IDC MSMT LEADCHNL RV PACING THRESHOLD AMPLITUDE: 1.75 V
MDC IDC MSMT LEADCHNL RV PACING THRESHOLD PULSEWIDTH: 1 ms
MDC IDC MSMT LEADCHNL RV SENSING INTR AMPL: 10.9 mV
MDC IDC SESS DTM: 20150720120242
MDC IDC SET LEADCHNL RA PACING AMPLITUDE: 2.5 V
MDC IDC SET LEADCHNL RV PACING AMPLITUDE: 3.5 V
MDC IDC SET LEADCHNL RV SENSING SENSITIVITY: 2 mV

## 2013-10-09 NOTE — Patient Instructions (Addendum)
Remote monitoring is used to monitor your pacemaker from home. This monitoring reduces the number of office visits required to check your device to one time per year. It allows [place] to keep an eye on the functioning of your device to ensure it is working properly. You are scheduled for a device check from home on 01-10-2014. You may send your transmission at any time that day. If you have a wireless device, the transmission will be sent automatically. After your physician reviews your transmission, you will receive a postcard with your next transmission date.  Your physician recommends that you schedule a follow-up appointment in: 6 months with Kathrene Alu and 12 months with Dr.Allred

## 2013-10-09 NOTE — Progress Notes (Signed)
PCP:  [name], MD  The patient presents today for routine electrophysiology followup.  Since last being seen in our clinic, the patient reports doing reasonably well.  He is followed by Dr Servando Snare for lung CA.  Reluctantly, he admits that he is still smoking "a little".  I have encouraged that he quit.   Today, he denies symptoms of palpitations, chest pain, orthopnea, PND, lower extremity edema, presyncope, or syncope.  He has chronic SOB which is stable.  He has rare dizziness.  The patient feels that he is tolerating medications without difficulties and is otherwise without complaint today.   Past Medical History  Diagnosis Date  . Hyperlipidemia   . Hypertension   . Esophageal reflux   . Diverticulosis   . Rheumatic fever ~ 1944  . Vision loss of left eye     S/P "cataract OR"; "can see a little; not much"  . Kidney stones   . Hard of hearing, left   . Pulmonary embolism     On Xarelto  . Hypercalcemia   . Arthritis     "knees"  . CAD (coronary artery disease)     nonobstructive by cath 07/22/11 with small to moderate sized diagonal branch with ostial stenosis , medical therapy advised  . AV block     s/p PPM  . COPD (chronic obstructive pulmonary disease)   . Carotid artery aneurysm     Bilateral internal carotid artery aneurysms followed by Dr. Donnetta Hutching  . Shortness of breath     with ambulation  . Constipation   . Shingles 12/04/2012    right lateral anterior chest  . Radiation 10/7,10/9,01/03/2013    SBRT Left upper lobe lung  . Prostate cancer     s/p cryotherapy  . Lung cancer, primary, with metastasis from lung to other site dx'd 09/2011 & 11/2012   Past Surgical History  Procedure Laterality Date  . Cystoscopy    . Lung surgery  1957    "born w/bleb in [place]  . Insert / replace / remove pacemaker  1991    initial placement  . Insert / replace / remove pacemaker  ~ 2011    "changed out"  . Tonsillectomy and adenoidectomy  1943  . Appendectomy   2000's  . Cholecystectomy  2000's  . Cataract extraction w/ intraocular lens implant  ~ 2008    left eye  . Lithotripsy  ~ 2011    "twice"  . Cardiac catheterization    . Prostate cryoablation  ~ 2008  . Video bronchoscopy  10/20/2011    Procedure: VIDEO BRONCHOSCOPY;  Surgeon: Grace Isaac, MD;  Location: [place];  Service: Thoracic;  Laterality: N/A;  . Needle core biopsy  12/06/2012    Lung/lul  . Esophagogastroduodenoscopy N/A 09/20/2013    Procedure: ESOPHAGOGASTRODUODENOSCOPY (EGD);  Surgeon: Winfield Cunas., MD;  Location: [facility] ENDOSCOPY;  Service: Endoscopy;  Laterality: N/A;    Current Outpatient Prescriptions  Medication Sig Dispense Refill  . acetaminophen (TYLENOL) 325 MG tablet Take 2 tablets (650 mg total) by mouth every 6 (six) hours as needed for mild pain, fever or headache.      . albuterol (PROVENTIL,VENTOLIN) 90 MCG/ACT inhaler Inhale 2 puffs into the lungs every 6 (six) hours as needed for shortness of breath. For wheezing or shortness of breath      . DULoxetine (CYMBALTA) 60 MG capsule Take 60 mg by mouth daily.      Marland Kitchen losartan (COZAAR) 50 MG tablet  Take 1 tablet by mouth daily.      . nitroGLYCERIN (NITROSTAT) 0.4 MG SL tablet Place 1 tablet (0.4 mg total) under the tongue every 5 (five) minutes as needed. For chest pain  30 tablet  3  . pantoprazole (PROTONIX) 40 MG tablet Take 1 tablet (40 mg total) by mouth 2 (two) times daily before a meal.  60 tablet  0  . rOPINIRole (REQUIP) 0.5 MG tablet Take 1 tablet (0.5 mg total) by mouth at bedtime.  30 tablet  0  . warfarin (COUMADIN) 5 MG tablet Take 5 mg by mouth daily.       No current facility-administered medications for this visit.    Allergies  Allergen Reactions  . Iohexol      Desc: PT STATED TODAY HE GETS SLIGHT SOB FROM IV CONTRAST. HE NEEDS FULL PREMEDS FROM NOW ON PER DR. MATTERN   . Penicillins Itching and Rash    "haven't took none in 40 years"    History   Social History  . Marital Status:  Married    Spouse Name: N/A    Number of Children: 1  . Years of Education: N/A   Occupational History  . Retired     Administrator    Social History Main Topics  . Smoking status: Current Some Day Smoker -- 1.00 packs/day for 50 years    Types: Cigarettes    Last Attempt to Quit: 03/23/2004  . Smokeless tobacco: Current User    Types: Chew  . Alcohol Use: No  . Drug Use: No  . Sexual Activity: Not Currently   Other Topics Concern  . Not on file   Social History Narrative   2 caffeine drink daily     Family History  Problem Relation Age of Onset  . Hypertension Neg Hx   . Hyperlipidemia Neg Hx   . Heart failure Neg Hx   . Heart disease Neg Hx   . Colon cancer Neg Hx   . Breast cancer Mother   . Emphysema Sister     was a smoker  . Brain cancer Father 5     type unknown    Physical Exam: Filed Vitals:   10/09/13 1056  BP: 154/90  Pulse: 107  Height: 5\' 11"  (1.803 m)  Weight: 157 lb 1.9 oz (71.269 kg)    GEN- The patient is well appearing, alert and oriented x 3 today.   Head- normocephalic, atraumatic Eyes-  Sclera clear, conjunctiva pink Ears- hearing intact Oropharynx- clear Neck- supple  Lungs- Clear to ausculation bilaterally, normal work of breathing Chest- pacemaker pocket is well healed Heart- Regular rate and rhythm, no murmurs, rubs or gallops, PMI not laterally displaced GI- soft, NT, ND, + BS Extremities- no clubbing, cyanosis, or edema  Pacemaker interrogation- reviewed in detail today,  See PACEART report  Assessment and Plan:  1. Transient AV block Normal pacemaker function See Pace Art report No changes today  2. Nonsustained VT Asymptomatic EF is preserved I will not start beta blocker today due to lung disease.  If episodes increase then I will reconsider  3. htn Stable No change required today  4. HL Stable No change required today  5. Prior PTE Remote history of PTE.  Given recent supratherapeutic INR with GI  bleeding, his coumadin was placed on hold.  This has since been restarted by primary care. The risks/ benefits in anticoagulation for this patient not completely clear.  Given prior unprovoked DVT/PE, it is reasonable  to continue long term anticoagulation if bleeding risks allow.  He will follow-up with GI later this week. I would ask that GI assist with our risks/ benefit determination.  I have recommended eliquis 2.5mg  BID as perhaps a better long term option.  I think that bleeding risks would be lower with this approach.  The patient is clear at this time that he is not interested in eliquis due to costs associated with the medicine. I will defer management decision to primary care.  6. Tobacco Unfortunately he continues to smoke.  I have strongly encouraged cessation  Pacemaker followed with [facility] Follow-up with Truitt Merle in 6 months.  I will see in a year

## 2013-10-12 ENCOUNTER — Ambulatory Visit
Admission: RE | Admit: 2013-10-12 | Discharge: 2013-10-12 | Disposition: A | Discharge status: Home / Self Care | Payer: Medicare Other | Source: Ambulatory Visit | Attending: Radiation Oncology | Admitting: Radiation Oncology

## 2013-10-12 DIAGNOSIS — C3412 Malignant neoplasm of upper lobe, left bronchus or lung: Secondary | ICD-10-CM

## 2013-10-12 NOTE — Progress Notes (Addendum)
   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 54 gy in 3 fractions completed 01/03/13  Interval History: [name] presents today for routine followup.  He is now almost a year out from treatment. He was hospitalized recently for a GI bleed but has recovered from that. He had a CT in June that showed response of the treated lesion and stability of his other nodules. He is accompanied by his daughter. No new respiratory complaints. No headache or bleeding. His pacemaker was interrogated on Monday and working well per his report.   Allergies:  Allergies  Allergen Reactions  . Iohexol      Desc: PT STATED TODAY HE GETS SLIGHT SOB FROM IV CONTRAST. HE NEEDS FULL PREMEDS FROM NOW ON PER DR. MATTERN   . Penicillins Itching and Rash    "haven't took none in 40 years"    Medications:  Current Outpatient Prescriptions  Medication Sig Dispense Refill  . acetaminophen (TYLENOL) 325 MG tablet Take 2 tablets (650 mg total) by mouth every 6 (six) hours as needed for mild pain, fever or headache.      . albuterol (PROVENTIL,VENTOLIN) 90 MCG/ACT inhaler Inhale 2 puffs into the lungs every 6 (six) hours as needed for shortness of breath. For wheezing or shortness of breath      . DULoxetine (CYMBALTA) 60 MG capsule Take 60 mg by mouth daily.      Marland Kitchen losartan (COZAAR) 50 MG tablet Take 1 tablet by mouth daily.      . nitroGLYCERIN (NITROSTAT) 0.4 MG SL tablet Place 1 tablet (0.4 mg total) under the tongue every 5 (five) minutes as needed. For chest pain  30 tablet  3  . pantoprazole (PROTONIX) 40 MG tablet Take 1 tablet (40 mg total) by mouth 2 (two) times daily before a meal.  60 tablet  0  . rOPINIRole (REQUIP) 0.5 MG tablet Take 1 tablet (0.5 mg total) by mouth at bedtime.  30 tablet  0  . warfarin (COUMADIN) 5 MG  tablet Take 5 mg by mouth daily.       No current facility-administered medications for this encounter.    Physical Exam:  There were no vitals filed for this visit. Pleasant, no distress.  IMPRESSION: [name] is a 80 y.o. male s/p SBRT for a left upper lobe lesion with no evidence of disease  PLAN:  Follow up in 1 year. Dr. Servando Snare has a scan ordered in October. We discussed treatment of the small nodules in his chest may become necessary if they start to grow at some point.     Thea Silversmith, MD

## 2013-10-16 ENCOUNTER — Encounter: Payer: Self-pay | Admitting: Vascular Surgery

## 2013-10-17 ENCOUNTER — Encounter: Payer: Self-pay | Admitting: Vascular Surgery

## 2013-10-17 ENCOUNTER — Other Ambulatory Visit: Payer: Self-pay | Admitting: *Deleted

## 2013-10-17 ENCOUNTER — Ambulatory Visit (INDEPENDENT_AMBULATORY_CARE_PROVIDER_SITE_OTHER): Payer: Medicare Other | Admitting: Vascular Surgery

## 2013-10-17 VITALS — BP 141/75 | HR 108 | Resp 16 | Ht 71.0 in | Wt 158.0 lb

## 2013-10-17 DIAGNOSIS — I6529 Occlusion and stenosis of unspecified carotid artery: Secondary | ICD-10-CM

## 2013-10-17 MED ORDER — PREDNISONE 50 MG PO TABS: ORAL_TABLET | ORAL | Status: DC

## 2013-10-17 MED ORDER — DIPHENHYDRAMINE HCL 50 MG PO CAPS: ORAL_CAPSULE | ORAL | Status: DC

## 2013-10-17 NOTE — Patient Instructions (Signed)
Take 1 prednisone tablet 13 hours , 1 tablet 7 hours prior and 1 prednisone tablet 1 hour prior to your CT scan. Also take 1 tablet of Benadryl 25 mg 1 hour prior to the CT scan.

## 2013-10-17 NOTE — Progress Notes (Signed)
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 These were found incidentally in 2012 been followed with serial CT scans since that time. Most recently his CT scan was in August of 2014. There was a question of contrast allergy and therefore he did not get a CT to 4 days visit. He is here today with his daughter. He does report some neck stiffness which appears to be musculoskeletal has had specifically no neurologic deficits. He has been treated for lung cancer. He does quite good today.  Past Medical History  Diagnosis Date  . Hyperlipidemia   . Hypertension   . Esophageal reflux   . Diverticulosis   . Rheumatic fever ~ 1944  . Vision loss of left eye     S/P "cataract OR"; "can see a little; not much"  . Kidney stones   . Hard of hearing, left   . Pulmonary embolism     On Xarelto  . Hypercalcemia   . Arthritis     "knees"  . CAD (coronary artery disease)     nonobstructive by cath 07/22/11 with small to moderate sized diagonal branch with ostial stenosis , medical therapy advised  . AV block     s/p PPM  . COPD (chronic obstructive pulmonary disease)   . Carotid artery aneurysm     Bilateral internal carotid artery aneurysms followed by Dr. Donnetta Hutching  . Shortness of breath     with ambulation  . Constipation   . Shingles 12/04/2012    right lateral anterior chest  . Radiation 10/7,10/9,01/03/2013    SBRT Left upper lobe lung  . Prostate cancer     s/p cryotherapy  . Lung cancer, primary, with metastasis from lung to other site dx'd 09/2011 & 11/2012  . Anemia     History  Substance Use Topics  . Smoking status: Current Some Day Smoker -- 1.00 packs/day for 50 years    Types: Cigarettes    Last Attempt to Quit: 03/23/2004  . Smokeless tobacco: Current User    Types: Chew  . Alcohol Use: No    Family History  Problem Relation Age of Onset  . Hypertension Neg Hx   . Hyperlipidemia Neg Hx   . Heart failure Neg Hx   . Heart disease Neg Hx   . Colon cancer  Neg Hx   . Breast cancer Mother   . Cancer Mother     Breast  . Emphysema Sister     was a smoker  . Brain cancer Father 24     type unknown    Allergies  Allergen Reactions  . Iohexol      Desc: PT STATED TODAY HE GETS SLIGHT SOB FROM IV CONTRAST. HE NEEDS FULL PREMEDS FROM NOW ON PER DR. MATTERN   . Penicillins Itching and Rash    "haven't took none in 40 years"    Current outpatient prescriptions:acetaminophen (TYLENOL) 325 MG tablet, Take 2 tablets (650 mg total) by mouth every 6 (six) hours as needed for mild pain, fever or headache., Disp: , Rfl: ;  DULoxetine (CYMBALTA) 60 MG capsule, Take 60 mg by mouth daily., Disp: , Rfl: ;  nitroGLYCERIN (NITROSTAT) 0.4 MG SL tablet, Place 1 tablet (0.4 mg total) under the tongue every 5 (five) minutes as needed. For chest pain, Disp: 30 tablet, Rfl: 3 pantoprazole (PROTONIX) 40 MG tablet, Take 1 tablet (40 mg total) by mouth 2 (two) times daily before a meal., Disp: 60 tablet, Rfl: 0;  rOPINIRole (REQUIP) 0.5  MG tablet, Take 1 tablet (0.5 mg total) by mouth at bedtime., Disp: 30 tablet, Rfl: 0;  warfarin (COUMADIN) 5 MG tablet, Take 5 mg by mouth daily., Disp: , Rfl:  albuterol (PROVENTIL,VENTOLIN) 90 MCG/ACT inhaler, Inhale 2 puffs into the lungs every 6 (six) hours as needed for shortness of breath. For wheezing or shortness of breath, Disp: , Rfl: ;  diphenhydrAMINE (BENADRYL) 50 MG capsule, Take 50 mg by mouth 1 hour prior to your procedure., Disp: 1 capsule, Rfl: 0;  losartan (COZAAR) 50 MG tablet, Take 1 tablet by mouth daily., Disp: , Rfl:  predniSONE (DELTASONE) 50 MG tablet, One tablet (50mg ) 13 hours prior to procedure; one tablet (50mg ) 7 hours prior to procedure and then one tablet (50 mg) one hour prior to procedure., Disp: 3 tablet, Rfl: 0  BP 141/75  Pulse 108  Resp 16  Ht 5\' 11"  (1.803 m)  Wt 158 lb (71.668 kg)  BMI 22.05 kg/m2  SpO2 100%  Body mass index is 22.05 kg/(m^2).       Physical exam well-developed  well-nourished in no acute distress Neurologically grossly intact Carotid arteries without bruits bilaterally Normal bilateral carotid pulses  On further discussion he has been successfully treated for contrast allergy with oral medications and has had no difficulty with this. We will schedule a CT scan I will discuss this with them by telephone. I have explained the need to continue surveillance of these the pseudoaneurysm was reportedly been stable now for 13 years.

## 2013-10-18 ENCOUNTER — Other Ambulatory Visit: Payer: Self-pay | Admitting: *Deleted

## 2013-10-23 ENCOUNTER — Ambulatory Visit
Admission: RE | Admit: 2013-10-23 | Discharge: 2013-10-23 | Disposition: A | Discharge status: Home / Self Care | Payer: Medicare Other | Source: Ambulatory Visit | Attending: Vascular Surgery | Admitting: Vascular Surgery

## 2013-10-23 DIAGNOSIS — I6529 Occlusion and stenosis of unspecified carotid artery: Secondary | ICD-10-CM

## 2013-10-23 MED ORDER — IOHEXOL 350 MG/ML SOLN
80.0000 mL | Freq: Once | INTRAVENOUS | Status: AC | PRN
  Administered 2013-10-23: 80 mL via INTRAVENOUS

## 2013-11-16 ENCOUNTER — Encounter: Payer: Self-pay | Admitting: Internal Medicine

## 2013-12-05 ENCOUNTER — Other Ambulatory Visit: Payer: Self-pay | Admitting: *Deleted

## 2013-12-05 DIAGNOSIS — C3432 Malignant neoplasm of lower lobe, left bronchus or lung: Secondary | ICD-10-CM

## 2013-12-28 ENCOUNTER — Other Ambulatory Visit: Payer: Self-pay | Admitting: *Deleted

## 2013-12-28 ENCOUNTER — Ambulatory Visit
Admission: RE | Admit: 2013-12-28 | Discharge: 2013-12-28 | Disposition: A | Discharge status: Home / Self Care | Payer: Medicare Other | Source: Ambulatory Visit | Attending: Cardiothoracic Surgery | Admitting: Cardiothoracic Surgery

## 2013-12-28 ENCOUNTER — Encounter: Payer: Self-pay | Admitting: Cardiothoracic Surgery

## 2013-12-28 ENCOUNTER — Ambulatory Visit (INDEPENDENT_AMBULATORY_CARE_PROVIDER_SITE_OTHER): Payer: Medicare Other | Admitting: Cardiothoracic Surgery

## 2013-12-28 VITALS — BP 129/79 | HR 113 | Ht 68.0 in | Wt 168.0 lb

## 2013-12-28 DIAGNOSIS — C3432 Malignant neoplasm of lower lobe, left bronchus or lung: Secondary | ICD-10-CM

## 2013-12-28 DIAGNOSIS — C3412 Malignant neoplasm of upper lobe, left bronchus or lung: Secondary | ICD-10-CM

## 2013-12-28 NOTE — Progress Notes (Signed)
[address]       [place] [address]             [phone]                         Paolo C Pete Woodward Medical Record #[id number] Date of Birth: 03-10-1933  [name], * [name], MD  Chief Complaint:   PostOp Follow Up Visit 10/20/2011  DATE OF DISCHARGE:  OPERATIVE REPORT  Preop: Non small cell Lung cancer- left  Postop: same  SURGICAL PROCEDURE: Repeat left Mini thoracotomy, wedge resection, and  segmentectomy of the superior segment of the left lower lobe, and  placement of On-Q device.    pT1b, pNX, MX. INVASIVE MODERATELY DIFFERENTIATED ADENOCARCINOMA, SPANNING 2.2 CM IN GREATEST DIMENSION.   Wedge resection 10/20/2011  Stereotactic radio therapy to left upper lobe lesion in October 2014  Needle BX of left upper lobe lesion: [id number]  ALK negative, EGFR  positive Lung, needle/core biopsy(ies), LUL - POSITIVE FOR ADENOCARCINOMA.  History of Present Illness:     Patient returns to the office today for regularly scheduled followup after surgical wedge resection of a T1 B. moderately differentiated adenocarcinoma the lung 2 cm in size located in the superior segment of the left lower lobe 10/20/2011. The patient previously had full left thoracotomy many years ago for "cyst".   An separate  enlarging lesion in the left upper lobe was biopsy proven adenocarcinoma and he underwent stereotactic radiotherapy in the fall of 2014  Patient returns today with a followup CT scan following surgical resection of a left lower lobe lung adenocarcinoma in July of 2013 and subsequent stereotactic radiotherapy for a second primary in the left upper lobe.  Patient now reports that his wife has stage IV liver cancer after a prolonged hospitalization for perforated bowel following hernia repair.  History  Smoking status  . Current Some Day Smoker -- 1.00 packs/day for 50 years  . Types: Cigarettes  . Last Attempt to Quit:  03/23/2004  Smokeless tobacco  . Current User  . Types: Chew       Allergies  Allergen Reactions  . Iohexol      Desc: PT STATED TODAY HE GETS SLIGHT SOB FROM IV CONTRAST. HE NEEDS FULL PREMEDS FROM NOW ON PER DR. MATTERN   . Penicillins Itching and Rash    "haven't took none in 40 years"    Current Outpatient Prescriptions  Medication Sig Dispense Refill  . acetaminophen (TYLENOL) 325 MG tablet Take 2 tablets (650 mg total) by mouth every 6 (six) hours as needed for mild pain, fever or headache.      . DULoxetine (CYMBALTA) 60 MG capsule Take 60 mg by mouth daily.      Marland Kitchen losartan (COZAAR) 50 MG tablet Take 1 tablet by mouth daily.      . pantoprazole (PROTONIX) 40 MG tablet Take 1 tablet (40 mg total) by mouth 2 (two) times daily before a meal.  60 tablet  0  . rOPINIRole (REQUIP) 0.5 MG tablet Take 1 tablet (0.5 mg total) by mouth at bedtime.  30 tablet  0  . warfarin (COUMADIN) 5 MG tablet Take 5 mg by mouth daily.      Marland Kitchen albuterol (PROVENTIL,VENTOLIN) 90 MCG/ACT inhaler Inhale 2 puffs into the lungs every 6 (six) hours as needed for shortness of breath. For wheezing or shortness of breath      .  diphenhydrAMINE (BENADRYL) 50 MG capsule Take 50 mg by mouth 1 hour prior to your procedure.  1 capsule  0  . nitroGLYCERIN (NITROSTAT) 0.4 MG SL tablet Place 1 tablet (0.4 mg total) under the tongue every 5 (five) minutes as needed. For chest pain  30 tablet  3  . predniSONE (DELTASONE) 50 MG tablet One tablet (61m) 13 hours prior to procedure; one tablet (512m 7 hours prior to procedure and then one tablet (50 mg) one hour prior to procedure.  3 tablet  0   No current facility-administered medications for this visit.       Physical Exam: BP 129/79  Pulse 113  Ht _0  (1.727 m)  Wt 168 lb (76.204 kg)  BMI 25.55 kg/m2  SpO2 95% Patient in sinus rhythm on palpation today General appearance: alert and cooperative Neurologic: intact Heart: regular rate and rhythm, S1, S2  normal, no murmur, click, rub or gallop and normal apical impulse Lungs: clear to auscultation bilaterally and normal percussion bilaterally Abdomen: soft, non-tender; bowel sounds normal; no masses,  no organomegaly Extremities: extremities normal, atraumatic, no cyanosis or edema and Homans sign is negative, no sign of DVT Wounds:well healed.  No adenopathy  Diagnostic Studies & Laboratory data:         Recent Radiology Findings: Ct Chest Wo Contrast  12/28/2013   CLINICAL DATA:  Patient with history of lung cancer in 2013. Recurrence in 2014. Cough, mild shortness of breath.  EXAM: CT CHEST WITHOUT CONTRAST  TECHNIQUE: Multidetector CT imaging of the chest was performed following the standard protocol without IV contrast.  COMPARISON:  Chest CT 06/16/2013  FINDINGS: Pacer apparatus within the right anterior hemi thorax. Normal heart size. No pericardial effusion. No mediastinal or hilar adenopathy. Stable prominent sub cm mediastinal lymph nodes. Calcified atherosclerotic plaque involving the thoracic aorta.  Central airways are patent. Extensive diffuse centrilobular emphysema. Stable 8 mm right lower lobe pulmonary nodule. Stable 9 mm right upper lobe ground-glass nodule (image 23; series 4). Stable 5 mm left lower lobe pulmonary nodule (image 31; series 4). Stable left upper lobe suture line.  Incidental imaging of the upper abdomen demonstrate postcholecystectomy changes. Bilateral renal cysts. Normal adrenal glands. No aggressive or acute appearing osseous lesions.  IMPRESSION: Stable CT of the chest.  No change in pulmonary nodules.  Emphysema   Electronically Signed   By: DrLovey Newcomer.D.   On: 12/28/2013 13:18  Ct Chest Wo Contrast  08/28/2013   CLINICAL DATA:  Followup lung cancer  EXAM: CT CHEST WITHOUT CONTRAST  TECHNIQUE: Multidetector CT imaging of the chest was performed following the standard protocol without IV contrast.  COMPARISON:  06/16/2013  FINDINGS: No pleural effusion  identified. Moderate to advanced changes of centrilobular emphysema identified. Pulmonary nodule within the right lower lobe is stable measuring 8 mm, image 44/series 5. Pulmonary nodule within the left lower lobe is stable measuring 5 mm, image 36/series 5. Peripheral scar like densities within the left lower lobe appear stable, image 31/series 5. Stable appearance of left upper lobe posterior suture line.  The heart size appears normal. There is no pericardial effusion. No mediastinal or hilar adenopathy. Calcified atherosclerotic disease involves the thoracic aorta.  Incidental imaging through the upper abdomen is significant for bilateral renal cysts. The adrenal glands appear normal. Patient is status post cholecystectomy.  Review of the visualized bony structures is negative for aggressive lytic or sclerotic bone lesion.  IMPRESSION: 1. Stable CT of the chest. 2. No change in  bilateral pulmonary nodules. 3. Atherosclerotic disease. 4. Emphysema.   Electronically Signed   By: Kerby Moors M.D.   On: 08/28/2013 14:27   Ct Chest Wo Contrast  06/16/2013   CLINICAL DATA:  Lung cancer. Prior left upper lobe resection. Right lung cancer with radiation. History of prostate cancer.  EXAM: CT CHEST WITHOUT CONTRAST  TECHNIQUE: Multidetector CT imaging of the chest was performed following the standard protocol without IV contrast.  COMPARISON:  02/27/2013  FINDINGS: Severe emphysematous changes in the lungs. Postsurgical changes in the posterior superior left upper lobe. 12 mm nodular area in the left apex, stable. Biapical scarring. Right lower lobe nodule on image 42 measures 8 mm, stable. Subpleural densities noted in the left lateral hemithorax are unchanged. No pleural effusions. No new or enlarging pulmonary nodules. Small nodule in the left lower lobe on image 36 measures 5 mm, stable.  Pacer is noted in place with leads in the right atrium and right ventricle, stable. Heart is normal size. Ascending aortic  borderline in diameter at 3.9 cm. Small scattered mediastinal lymph nodes, none pathologically enlarged or changed since prior study. Chest wall soft tissues are unremarkable.  Large subcutaneous low-density lesion is noted posteriorly in the right posterior shoulder measuring approximately 6.2 cm, stable since PET CT. This presumably represents a large sebaceous cyst.  IMPRESSION: Stable postoperative changes in the posterior superior left upper lobe. Stable bilateral pulmonary nodules. No new or enlarging pulmonary nodules.  Mild slight dilatation of the ascending thoracic aorta measuring 4.0 cm.   Electronically Signed   By: Rolm Baptise M.D.   On: 06/16/2013 13:11  Ct Angio Head W/cm &/or Wo Cm  11/13/2012   *RADIOLOGY REPORT*  Clinical Data:  Numbness all over.  History of bilateral ICA pseudoaneurysms.  Headache.  THE PATIENT WAS PREMEDICATED WITH IV STEROIDS BECAUSE OF THE HISTORY OF CONTRAST ALLERGY.  THERE WERE NO ADVERSE EVENTS.  CT ANGIOGRAPHY HEAD AND NECK  Technique:  Multidetector CT imaging of the head and neck was performed using the standard protocol during bolus administration of intravenous contrast.  Multiplanar CT image reconstructions including MIPs were obtained to evaluate the vascular anatomy. Carotid stenosis measurements (when applicable) are obtained utilizing NASCET criteria, using the distal internal carotid diameter as the denominator.  Contrast: 72m OMNIPAQUE IOHEXOL 350 MG/ML SOLN  Comparison:  08/25/2011 CT angio neck.  10/03/2008 CT angio head.  CTA NECK  Findings:  Right chest cardiac pacemaker.  COPD.  Spiculated left upper lobe lesion is new from priors measuring 11 x 15 mm.  In the setting of chronic of tobacco abuse, concern raised for pulmonary neoplasm.  Previously identified and biopsied, spiculated lesion superior segment left lower lobe lesion, not well seen on today's examination.  Stable subcentimeter thyroid nodules.  Negative larynx.  Advanced spondylosis.   Three-vessel arch configuration without proximal stenosis.  Unremarkable right common carotid artery.  Mild nonstenotic posterior wall plaque.  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  No stenosis.  No significant left or right vertebral artery pathology.  Left vertebral dominant.  Unremarkable left common carotid artery.  Posterior wall plaque is non stenotic in the proximal left internal carotid artery. Rim calcified predominately thrombosed 7 x 7 x 8 mm more inferior cervical ICA aneurysm projecting medially is unchanged.  More superiorly, a  partially thrombosed and calcified skull base cervical ICA aneurysm measuring  8 x 10 x 11 mm is also stable. Lateral thrombus extends cephalad to the level of the horizontal petrous/foramen lacerum level.  No ICA dissection.   Review of the MIP images confirms the above findings.  IMPRESSION:  Stable extracranial atherosclerotic change and stable bilateral cervical ICA pseudoaneurysms.  No evidence for enlargement, the dissection, or neck hemorrhage.  New spiculated left upper lobe 11 x 15 mm lesion; in the setting of COPD cannot exclude squamous cell carcinoma. As the patient has been premedicated, CT chest will be performed shortly with an additional 50 ml of contrast for expeditious  evaluation of the chest and mediastinum given his contrast allergy. Findings discussed with EDP.  CTA HEAD  Findings:  There is no evidence for acute infarction, intracranial hemorrhage, mass lesion, hydrocephalus, or extra-axial fluid.  Mild atrophy.  Mild white matter disease.  Post infusion, no abnormal enhancement.  Intact calvarium.  The  intracranial vasculature is dolichoectatic but patent.  No intracranial dissection or berry aneurysm is present.  The left vertebral is dominant.  There is no proximal vascular occlusion or significant flow reducing lesion.  Similar appearance to priors.   Review of the MIP images confirms the above findings.  IMPRESSION: Unremarkable CTA head.   Original Report Authenticated By: Rolla Flatten, M.D.   Ct Angio Neck W/cm &/or Wo/cm  11/13/2012   *RADIOLOGY REPORT*  Clinical Data:  Numbness all over.  History of bilateral ICA pseudoaneurysms.  Headache.  THE PATIENT WAS PREMEDICATED WITH IV STEROIDS BECAUSE OF THE HISTORY OF CONTRAST ALLERGY.  THERE WERE NO ADVERSE EVENTS.  CT ANGIOGRAPHY HEAD AND NECK  Technique:  Multidetector CT imaging of the head and neck was performed using the standard protocol during bolus administration of intravenous contrast.  Multiplanar CT image reconstructions including MIPs were obtained to evaluate the vascular anatomy. Carotid stenosis measurements (when applicable) are obtained utilizing NASCET criteria, using the distal internal carotid diameter as the denominator.  Contrast: 24m OMNIPAQUE IOHEXOL 350 MG/ML SOLN  Comparison:  08/25/2011 CT angio neck.  10/03/2008 CT angio head.  CTA NECK  Findings:  Right chest cardiac pacemaker.  COPD.  Spiculated left upper lobe lesion is new from priors measuring 11 x 15 mm.  In the setting of chronic of tobacco abuse, concern raised for pulmonary neoplasm.  Previously identified and biopsied, spiculated lesion superior segment left lower lobe lesion, not well seen on today's examination.  Stable subcentimeter thyroid nodules.  Negative larynx.  Advanced spondylosis.  Three-vessel arch configuration without proximal stenosis.  Unremarkable right common carotid artery.  Mild nonstenotic posterior wall plaque.  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  No stenosis.  No significant left or right vertebral artery pathology.  Left vertebral dominant.  Unremarkable left common carotid artery.  Posterior wall plaque is non stenotic in the proximal  left internal carotid artery. Rim calcified predominately thrombosed 7 x 7 x 8 mm more inferior cervical ICA aneurysm projecting medially is unchanged.  More superiorly, a  partially thrombosed and calcified skull base cervical ICA aneurysm measuring 8 x 10 x 11 mm is also stable. Lateral thrombus extends cephalad to the level of the horizontal petrous/foramen lacerum level.  No ICA dissection.   Review of the MIP images confirms the above findings.  IMPRESSION:  Stable extracranial atherosclerotic change and stable bilateral cervical ICA pseudoaneurysms.  No evidence for enlargement, the dissection,  or neck hemorrhage.  New spiculated left upper lobe 11 x 15 mm lesion; in the setting of COPD cannot exclude squamous cell carcinoma. As the patient has been premedicated, CT chest will be performed shortly with an additional 50 ml of contrast for expeditious  evaluation of the chest and mediastinum given his contrast allergy. Findings discussed with EDP.  CTA HEAD  Findings:  There is no evidence for acute infarction, intracranial hemorrhage, mass lesion, hydrocephalus, or extra-axial fluid.  Mild atrophy.  Mild white matter disease.  Post infusion, no abnormal enhancement.  Intact calvarium.  The  intracranial vasculature is dolichoectatic but patent.  No intracranial dissection or berry aneurysm is present.  The left vertebral is dominant.  There is no proximal vascular occlusion or significant flow reducing lesion. Similar appearance to priors.   Review of the MIP images confirms the above findings.  IMPRESSION: Unremarkable CTA head.   Original Report Authenticated By: Rolla Flatten, M.D.   Ct Chest W Contrast  11/22/2012   *RADIOLOGY REPORT*  Clinical Data: Shortness of breath, weakness, abnormality on recently performed neck CTA.  CT CHEST WITH CONTRAST  Technique:  Multidetector CT imaging of the chest was performed following the standard protocol during bolus administration of intravenous contrast.  Contrast:  69m OMNIPAQUE IOHEXOL 300 MG/ML  SOLN  Comparison: Neck CT head - earlier same day; chest CT - 07/14/2012; 09/03/2011  Findings:  Stable postsurgical change of the superior segment of the left lower lobe.  While the external diameter of the previous identified approximately 1.3 x 1.0 cm nodule within the left lung apex (image 13, series 3) is grossly unchanged, the nodule appears more consolidated on the present examination, with slight differences possibly attributable to slice selection.  Additional approximately 8 mm ground-glass nodule within the right lower lobe (image 43, series 3) appears grossly unchanged, as does an approximately 5 mm ground-glass nodule within the superior segment of the lingula (image 34, series 3).  No new discrete pulmonary nodules.  Scattered shoddy mediastinal lymph nodes appear grossly unchanged and are individually not enlarged by size criteria with index pretracheal node measuring 9 mm in short axis diameter (image 23, series 2). No definite hilar or axillary lymphadenopathy.  Advanced centrilobular emphysematous change.  Minimal dependent subsegmental atelectasis.  No focal airspace opacity.  No pleural effusion or pneumothorax.  The central pulmonary airways are patent.  Normal heart size.  Right anterior chest wall dual lead pacemaker. No pericardial effusion.  Normal caliber of the main pulmonary artery. Grossly unchanged mild ectasia of the ascending thoracic aorta measuring approximately 4.1 cm in greatest oblique short axis diameter (image 30, series 2).  Conventional configuration of the aortic arch.  Scattered minimal atherosclerotic plaque within the thoracic aorta.  No definite thoracic aortic dissection or periaortic stranding.  Limited evaluation of the upper abdomen demonstrates a partially exophytic approximately 3.0 cm cyst arising from the posterior superior aspect of the right kidney.  Several additional smaller renal cysts are noted bilaterally.  Post  cholecystectomy.  No acute or aggressive osseous abnormalities.  IMPRESSION:  1.  No change to minimal increase in the indeterminate approximately 1.3 cm nodule within the left lung apex, with slight differences in possibly attributable to slice selection.  While possibly representing of scar, given advanced emphysematous change, a slowly growing lung cancer is not excluded.  As such, further evaluation with a follow-up chest CT in 3 months is recommended.  2.  Grossly unchanged bilateral sub centimeter ground-glass nodules, likely too small  to accurately characterize.  Continued attention on follow-up is recommended.  3.  Stable postsurgical change of the superior segment of the left lower lobe.  4.  Grossly unchanged mild fusiform ectasia of the descending thoracic aorta measuring approximately 41 mm in diameter.  Above findings discussed with Dr. Zenia Resides at the time of examination completion.   Original Report Authenticated By: Jake Seats, MD   Ct Chest Wo Contrast  07/14/2012  *RADIOLOGY REPORT*  Clinical Data: Follow-up lung cancer.  CT CHEST WITHOUT CONTRAST  Technique:  Multidetector CT imaging of the chest was performed following the standard protocol without IV contrast.  Comparison: CT scan 08/18/2011.  Findings: The chest wall is stable.  A permanent right-sided pacemaker is noted.  No supraclavicular or axillary lymphadenopathy.  The bony thorax is intact.  No destructive bone lesions or spinal canal compromise.  Moderate degenerative changes involving the spine.  The heart is normal in size.  No pericardial effusion.  No mediastinal or hilar lymphadenopathy.  Stable tortuosity and ectasia of the thoracic aorta.  The esophagus is grossly normal.  Examination of the lung parenchyma demonstrates stable advanced emphysematous changes and pulmonary interstitial scarring.  The left upper lobe pulmonary nodule has been excised.  No findings for recurrent or residual tumor.  There is a new left apical lung  density.  This could reflect progressive scarring change but does need observation.  There is a 7 mm nodule at the right lung base.  This is difficult to see on the prior CT but was a on the prior PET CT and needs continued observation.  There is an ill-defined nodular density measuring approximate 5 mm in the superior segment of the left lower lobe on image number 31. I believe this was present on the prior CT scan although it is more obvious now.  A 4 mm nodule is noted on image number 22 in the left upper lobe. This was present on the prior study and has not significantly changed.  No acute pulmonary findings or pleural effusion.  The upper abdomen is unremarkable.  Stable right renal cyst.  IMPRESSION:  1.  Surgical changes from prior excision of a left apical lung neoplasm. 2.  Advanced emphysematous changes and pulmonary scarring. 3.  New 13 x 8 mm left apical lung density.  This could reflect scarring change but needs close observation. 4.  Other small pulmonary nodules as discussed above.  Recommend continued observation. 5.  No acute pulmonary findings and no mediastinal or hilar lymphadenopathy. 6.  Stable tortuosity and ectasia of the thoracic aorta.   Original Report Authenticated By: Marijo Sanes, M.D.       Recent Labs: Lab Results  Component Value Date   WBC 7.5 09/21/2013   HGB 9.2* 09/21/2013   HCT 26.4* 09/21/2013   PLT 229 09/21/2013   GLUCOSE 100* 09/21/2013   CHOL 192 07/22/2011   TRIG 76 07/22/2011   HDL 57 07/22/2011   LDLCALC 120* 07/22/2011   ALT 22 09/20/2013   AST 39* 09/20/2013   NA 139 09/21/2013   K 3.9 09/21/2013   CL 102 09/21/2013   CREATININE 0.88 09/21/2013   BUN 22 09/21/2013   CO2 27 09/21/2013   TSH 4.001 06/13/2011   INR 1.30 09/19/2013   HGBA1C 5.0 06/13/2011      Assessment / Plan:    Wedge resection of Stage I INVASIVE MODERATELY DIFFERENTIATED ADENOCARCINOMA, SPANNING 2.2 CM IN GREATEST DIMENSION superior segment left lower lobe 2013 The patient underwent stereotactic  radiotherapy to the left upper lobe lesion. Completed 01/03/13  EGFR positive   Currently the CT scan done today shows  stability of multiple bilateral pulmonary nodules.And no evidence on CT scan of any rib lesions.  We'll plan to see him back in 6 months with a followup CT scan of the chest to followup pulmonary lung nodules.   Jurni Cesaro B 12/28/2013 2:38 PM

## 2014-01-05 NOTE — Telephone Encounter (Signed)
Nothing was typed/tmj 

## 2014-01-10 ENCOUNTER — Encounter: Payer: Medicare Other | Admitting: *Deleted

## 2014-01-10 ENCOUNTER — Telehealth: Payer: Self-pay | Admitting: Cardiology

## 2014-01-10 NOTE — Telephone Encounter (Signed)
Spoke with pt and reminded pt of remote transmission that is due today. Pt verbalized understanding.   

## 2014-01-22 ENCOUNTER — Encounter: Payer: Self-pay | Admitting: Cardiology

## 2014-02-13 ENCOUNTER — Ambulatory Visit
Admission: RE | Admit: 2014-02-13 | Discharge: 2014-02-13 | Disposition: A | Discharge status: Home / Self Care | Payer: Medicare Other | Source: Ambulatory Visit | Attending: Family Medicine | Admitting: Family Medicine

## 2014-02-13 ENCOUNTER — Other Ambulatory Visit: Payer: Self-pay | Admitting: Family Medicine

## 2014-02-13 DIAGNOSIS — R091 Pleurisy: Secondary | ICD-10-CM

## 2014-03-01 ENCOUNTER — Encounter (HOSPITAL_COMMUNITY): Payer: Self-pay | Admitting: Cardiovascular Disease

## 2014-03-06 ENCOUNTER — Encounter: Payer: Self-pay | Admitting: *Deleted

## 2014-03-21 ENCOUNTER — Encounter: Payer: Self-pay | Admitting: Internal Medicine

## 2014-03-21 ENCOUNTER — Ambulatory Visit (INDEPENDENT_AMBULATORY_CARE_PROVIDER_SITE_OTHER): Payer: Medicare Other | Admitting: *Deleted

## 2014-03-21 DIAGNOSIS — I442 Atrioventricular block, complete: Secondary | ICD-10-CM

## 2014-03-21 LAB — MDC_IDC_ENUM_SESS_TYPE_INCLINIC
Battery Remaining Longevity: 118.8 mo
Battery Voltage: 2.95 V
Brady Statistic RA Percent Paced: 0.45 %
Brady Statistic RV Percent Paced: 0.01 %
Date Time Interrogation Session: 20151230130657
Lead Channel Impedance Value: 362.5 Ohm
Lead Channel Impedance Value: 437.5 Ohm
Lead Channel Pacing Threshold Amplitude: 2 V
Lead Channel Pacing Threshold Pulse Width: 1 ms
Lead Channel Pacing Threshold Pulse Width: 1 ms
Lead Channel Pacing Threshold Pulse Width: 1 ms
Lead Channel Sensing Intrinsic Amplitude: 4.3 mV
Lead Channel Setting Pacing Amplitude: 2 V
Lead Channel Setting Pacing Amplitude: 4 V
Lead Channel Setting Pacing Pulse Width: 1 ms
MDC IDC MSMT LEADCHNL RA PACING THRESHOLD AMPLITUDE: 1 V
MDC IDC MSMT LEADCHNL RA PACING THRESHOLD AMPLITUDE: 1 V
MDC IDC MSMT LEADCHNL RA PACING THRESHOLD PULSEWIDTH: 1 ms
MDC IDC MSMT LEADCHNL RV PACING THRESHOLD AMPLITUDE: 2 V
MDC IDC MSMT LEADCHNL RV SENSING INTR AMPL: 9.3 mV
MDC IDC PG SERIAL: 7167612
MDC IDC SET LEADCHNL RV SENSING SENSITIVITY: 2 mV

## 2014-03-21 NOTE — Progress Notes (Signed)
Pacemaker check in clinic. Normal device function. Thresholds, sensing, impedances consistent with previous measurements. Device programmed to maximize longevity. 2.9% mode switch + warfarin. 3 HVRs, 2 EGMs, both show 1:1 SVT. Device programmed at appropriate safety margins---changed RA output from 2.5 to 2.0V, RV output from 3.5 to 2.0V. Histogram distribution appropriate for patient activity level. Device programmed to optimize intrinsic conduction. Estimated longevity 3.5-9.64yrs. Merlin 06/20/14 & ROV w/ Dr. Rayann Heman in 52mo.

## 2014-03-26 ENCOUNTER — Other Ambulatory Visit (HOSPITAL_COMMUNITY): Payer: Self-pay | Admitting: Family Medicine

## 2014-03-26 DIAGNOSIS — R634 Abnormal weight loss: Secondary | ICD-10-CM

## 2014-03-26 DIAGNOSIS — Z859 Personal history of malignant neoplasm, unspecified: Secondary | ICD-10-CM

## 2014-04-02 ENCOUNTER — Ambulatory Visit (HOSPITAL_COMMUNITY)
Admission: RE | Admit: 2014-04-02 | Discharge: 2014-04-02 | Disposition: A | Discharge status: Home / Self Care | Payer: Medicare Other | Source: Ambulatory Visit | Attending: Family Medicine | Admitting: Family Medicine

## 2014-04-02 DIAGNOSIS — N281 Cyst of kidney, acquired: Secondary | ICD-10-CM | POA: Insufficient documentation

## 2014-04-02 DIAGNOSIS — Z85118 Personal history of other malignant neoplasm of bronchus and lung: Secondary | ICD-10-CM | POA: Diagnosis not present

## 2014-04-02 DIAGNOSIS — R634 Abnormal weight loss: Secondary | ICD-10-CM | POA: Diagnosis present

## 2014-04-02 DIAGNOSIS — N433 Hydrocele, unspecified: Secondary | ICD-10-CM | POA: Insufficient documentation

## 2014-04-02 DIAGNOSIS — Z859 Personal history of malignant neoplasm, unspecified: Secondary | ICD-10-CM

## 2014-04-02 DIAGNOSIS — R911 Solitary pulmonary nodule: Secondary | ICD-10-CM | POA: Insufficient documentation

## 2014-04-02 DIAGNOSIS — I517 Cardiomegaly: Secondary | ICD-10-CM | POA: Insufficient documentation

## 2014-04-02 LAB — GLUCOSE, CAPILLARY: Glucose-Capillary: 85 mg/dL (ref 70–99)

## 2014-04-02 MED ORDER — FLUDEOXYGLUCOSE F - 18 (FDG) INJECTION
7.8000 | Freq: Once | INTRAVENOUS | Status: AC | PRN
  Administered 2014-04-02: 7.8 via INTRAVENOUS

## 2014-04-03 ENCOUNTER — Other Ambulatory Visit: Payer: Self-pay | Admitting: Dermatology

## 2014-04-03 ENCOUNTER — Ambulatory Visit (HOSPITAL_COMMUNITY): Payer: Self-pay

## 2014-04-26 ENCOUNTER — Other Ambulatory Visit: Payer: Self-pay | Admitting: Dermatology

## 2014-05-16 ENCOUNTER — Telehealth: Payer: Self-pay | Admitting: *Deleted

## 2014-05-16 ENCOUNTER — Other Ambulatory Visit: Payer: Self-pay | Admitting: *Deleted

## 2014-05-16 DIAGNOSIS — R079 Chest pain, unspecified: Secondary | ICD-10-CM

## 2014-05-16 NOTE — Telephone Encounter (Signed)
[name] called at approximately 3:30 today requesting an appointment with Dr. Servando Snare.  When asked, he said that he had been having jaw pain since yesterday and his chest/lungs are hurting.  He is s/p L VATS, LEFT LOWER LOBE SEGMENTECTOMY for lung cancer on 10/20/11 and has been having scheduled f/u since that time, last o/v was 12/28/13.  He had been seen by his PCP and had a cxr, but he had suggested he see Dr. Servando Snare.  Specifically due to the jaw pain, I highly suggested he go to the emergency room, but he refused. I made him an appointment for tomorrow and informed Dr. Servando Snare.

## 2014-05-17 ENCOUNTER — Ambulatory Visit (INDEPENDENT_AMBULATORY_CARE_PROVIDER_SITE_OTHER): Payer: Medicare Other | Admitting: Cardiothoracic Surgery

## 2014-05-17 ENCOUNTER — Encounter: Payer: Self-pay | Admitting: Cardiothoracic Surgery

## 2014-05-17 ENCOUNTER — Ambulatory Visit
Admission: RE | Admit: 2014-05-17 | Discharge: 2014-05-17 | Disposition: A | Discharge status: Home / Self Care | Payer: Medicare Other | Source: Ambulatory Visit | Attending: Cardiothoracic Surgery | Admitting: Cardiothoracic Surgery

## 2014-05-17 ENCOUNTER — Other Ambulatory Visit: Payer: Self-pay | Admitting: *Deleted

## 2014-05-17 VITALS — BP 152/90 | HR 111 | Resp 16 | Ht 71.0 in | Wt 150.0 lb

## 2014-05-17 DIAGNOSIS — R079 Chest pain, unspecified: Secondary | ICD-10-CM

## 2014-05-17 DIAGNOSIS — C3412 Malignant neoplasm of upper lobe, left bronchus or lung: Secondary | ICD-10-CM

## 2014-05-17 NOTE — Progress Notes (Signed)
    [address]       [place] [address]             [phone]                         [name] [facility] Medical Record #[id number] Date of Birth: 10/05/1932  [name], * DarylWILSON OLIVER, MD  Chief Complaint:   PostOp Follow Up Visit 10/20/2011  DATE OF DISCHARGE:  OPERATIVE REPORT  Preop: Non small cell Lung cancer- left  Postop: same  SURGICAL PROCEDURE: Repeat left Mini thoracotomy, wedge resection, and  segmentectomy of the superior segment of the left lower lobe, and  placement of On-Q device.    pT1b, pNX, MX. INVASIVE MODERATELY DIFFERENTIATED ADENOCARCINOMA, SPANNING 2.2 CM IN GREATEST DIMENSION.   Wedge resection 10/20/2011  Stereotactic radio therapy to left upper lobe lesion in October 2014  Needle BX of left upper lobe lesion: [id number]  ALK negative, EGFR  positive Lung, needle/core biopsy(ies), LUL - POSITIVE FOR ADENOCARCINOMA.  History of Present Illness:     Patient returns to the office today before his regularly scheduled appointment after he called the office yesterday complaining of jaw pain and chest discomfort with increasing shortness of breath .he is seen in follow-up followup after surgical wedge resection of a T1 B. moderately differentiated adenocarcinoma the lung 2 cm in size located in the superior segment of the left lower lobe 10/20/2011. The patient previously had full left thoracotomy many years ago for "cyst". The patient declined to go to the emergency room yesterday. He has followed by Dr. Allred in cardiology for similar symptoms and pacemaker in the past. He notes a 16 pound weight loss, increasing shortness of breath with exertion associated with jaw pain. Occasionally it wakes him at night. He is recently been evaluated by dermatology for a chronic rash including biopsy but with no firm treatment or diagnosis. In mid January he had a PET scan done.  An separate  enlarging lesion in the  left upper lobe was biopsy proven adenocarcinoma and he underwent stereotactic radiotherapy in the fall of 2014  Patient returns today with a followup CT scan following surgical resection of a left lower lobe lung adenocarcinoma in July of 2013 and subsequent stereotactic radiotherapy for a second primary in the left upper lobe.  Patient  Wife continues to be treated for has stage IV liver cancer after a prolonged hospitalization for perforated bowel following hernia repair.  History  Smoking status  . Current Some Day Smoker -- 1.00 packs/day for 50 years  . Types: Cigarettes  . Last Attempt to Quit: 03/23/2004  Smokeless tobacco  . Current User  . Types: Chew       Allergies  Allergen Reactions  . Iohexol      Desc: PT STATED TODAY HE GETS SLIGHT SOB FROM IV CONTRAST. HE NEEDS FULL PREMEDS FROM NOW ON PER DR. MATTERN   . Penicillins Itching and Rash    "haven't took none in 40 years"    Current Outpatient Prescriptions  Medication Sig Dispense Refill  . losartan (COZAAR) 50 MG tablet Take 1 tablet by mouth daily.    . nitroGLYCERIN (NITROSTAT) 0.4 MG SL tablet Place 1 tablet (0.4 mg total) under the tongue every 5 (five) minutes as needed. For chest pain 30 tablet 3  . warfarin (COUMADIN) 5 MG tablet Take 5 mg by mouth daily.    .   acetaminophen (TYLENOL) 325 MG tablet Take 2 tablets (650 mg total) by mouth every 6 (six) hours as needed for mild pain, fever or headache. (Patient not taking: Reported on 05/17/2014)    . albuterol (PROVENTIL,VENTOLIN) 90 MCG/ACT inhaler Inhale 2 puffs into the lungs every 6 (six) hours as needed for shortness of breath. For wheezing or shortness of breath    . diphenhydrAMINE (BENADRYL) 50 MG capsule Take 50 mg by mouth 1 hour prior to your procedure. (Patient not taking: Reported on 05/17/2014) 1 capsule 0  . DULoxetine (CYMBALTA) 60 MG capsule Take 60 mg by mouth daily.    . pantoprazole (PROTONIX) 40 MG tablet Take 1 tablet (40 mg total) by mouth  2 (two) times daily before a meal. (Patient not taking: Reported on 05/17/2014) 60 tablet 0  . predniSONE (DELTASONE) 50 MG tablet One tablet (50mg) 13 hours prior to procedure; one tablet (50mg) 7 hours prior to procedure and then one tablet (50 mg) one hour prior to procedure. (Patient not taking: Reported on 03/21/2014) 3 tablet 0  . rOPINIRole (REQUIP) 0.5 MG tablet Take 1 tablet (0.5 mg total) by mouth at bedtime. (Patient not taking: Reported on 05/17/2014) 30 tablet 0   No current facility-administered medications for this visit.       Physical Exam: BP 152/90 mmHg  Pulse 111  Resp 16  Ht 5' 11" (1.803 m)  Wt 150 lb (68.04 kg)  BMI 20.93 kg/m2  SpO2 97% Patient in sinus rhythm on palpation today General appearance: alert and cooperative Neurologic: intact Heart: regular rate and rhythm, S1, S2 normal, no murmur, click, rub or gallop and normal apical impulse Lungs: clear to auscultation bilaterally and normal percussion bilaterally Abdomen: soft, non-tender; bowel sounds normal; no masses,  no organomegaly Extremities: extremities normal, atraumatic, no cyanosis or edema and Homans sign is negative, no sign of DVT Wounds:well healed.  No cervical or supraclavicular or axillary adenopathy He has a healing skin biopsy site on the left back, a fine papular rash over his torso  Diagnostic Studies & Laboratory data:         Recent Radiology Findings:  Dg Chest 2 View  05/17/2014   CLINICAL DATA:  History of left-sided lung malignancy treated with surgery in 2013; intermittent mild shortness of breath over the past 4-5 months. History of COPD and chronic tobacco use  EXAM: CHEST  2 VIEW  COMPARISON:  PA and lateral chest x-ray of February 14, 2015 and PET-CT study of February 01, 2015.  FINDINGS: The lungs are adequately inflated. There is stable pleural thickening in the left upper lobe. Surgical suture material is present in the left upper lobe. There is no pulmonary parenchymal  mass nor abnormal nodule. The heart and pulmonary vascularity are normal. There is tortuosity of the ascending and descending thoracic aorta. The permanent pacemaker is in appropriate position radiographically. The bony thorax exhibits no acute abnormality.  IMPRESSION: COPD. Chronic left apical pleural thickening. There is no evidence of pneumonia, CHF, active malignancy, nor other acute cardiopulmonary abnormality.   Electronically Signed   By: David  Jordan   On: 05/17/2014 08:47   PET:04/02/2014 CLINICAL DATA: Subsequent treatment strategy for restaging of lung cancer.  EXAM: NUCLEAR MEDICINE PET SKULL BASE TO THIGH  TECHNIQUE: 7.8 mCi F-18 FDG was injected intravenously. Full-ring PET imaging was performed from the skull base to thigh after the radiotracer. CT data was obtained and used for attenuation correction and anatomic localization.  FASTING BLOOD GLUCOSE: Value: 85 mg/dl    COMPARISON: PET of 12/01/2012. Most recent chest CT 12/28/2013.  FINDINGS: NECK  Hypermetabolism which corresponds to a posterior left parotid nodule. This measures 6 mm in a S.U.V. max of 5.9 on image 30. On the prior PET of 12/01/2012, this measured similar in size and was hypermetabolic.  No hypermetabolic cervical nodes.  CHEST  Mild hypermetabolism corresponding to left apical pleural parenchymal thickening. A left lower lobe vague 6 mm nodule corresponds to low level, non malignant range hypermetabolism. This measures a S.U.V. max of 0.8 on image 41. This nodule was similar in size on the prior CT and has been present back to 12/01/2012.  Low right paratracheal node measures 9 mm and a S.U.V. max of 3.4 on image 76. This node measured similar in size back to 12/01/2012.  ABDOMEN/PELVIS  No areas of abnormal hypermetabolism.  SKELETON  Facet arthropathy involving the left upper cervical spine. Concurrent hypermetabolism. No suspicious osseous abnormalities.  CT  IMAGES PERFORMED FOR ATTENUATION CORRECTION  Cerebral atrophy. No cervical adenopathy. Presumed sebaceous cyst about the posterior right neck at 5.6 cm.  Mild cardiomegaly. Ascending aorta upper normal in size, 3.8 cm. Similar. Aortic and branch vessel atherosclerosis. Pacer. Centrilobular emphysema. Right lower lobe 7 mm ground-glass nodule on image 48, similar. 4 mm left lower lobe nodule on image 27, similar.  Normal adrenal glands. Bilateral renal cysts. Bilateral nephrolithiasis. 11 mm hyper attenuating left renal lesion on image 135 is unchanged back to 12/01/2012. Cholecystectomy. Right common iliac artery dilatation is similar at 1.6 cm. Mild prostatomegaly. Right hydrocele. Left thoracotomy changes.  IMPRESSION: 1. A right paratracheal node is mildly hypermetabolic, but similar in size back to 2014. Favored to be reactive. 2. Otherwise, no evidence of hypermetabolic recurrent or metastatic disease. 3. Left lower lobe nodule which demonstrates low level, non malignant range hypermetabolism. Given stability back to 2014, likely benign. 4. Incidental findings, including borderline ascending aortic dilatation, probable left renal complex cyst, and right common iliac artery dilatation. 5. Subtle hypermetabolic nodule in the left parotid gland has been present back to 2014. Although this could represent a primary parotid neoplasm, stability suggests relative indolent behavior. This could be re-evaluated on follow-up or further characterized with contrast and neck CT.   Electronically Signed  By: Kyle Talbot M.D.  On: 04/02/2014 12:58 Ct Chest Wo Contrast  12/28/2013   CLINICAL DATA:  Patient with history of lung cancer in 2013. Recurrence in 2014. Cough, mild shortness of breath.  EXAM: CT CHEST WITHOUT CONTRAST  TECHNIQUE: Multidetector CT imaging of the chest was performed following the standard protocol without IV contrast.  COMPARISON:  Chest CT 06/16/2013   FINDINGS: Pacer apparatus within the right anterior hemi thorax. Normal heart size. No pericardial effusion. No mediastinal or hilar adenopathy. Stable prominent sub cm mediastinal lymph nodes. Calcified atherosclerotic plaque involving the thoracic aorta.  Central airways are patent. Extensive diffuse centrilobular emphysema. Stable 8 mm right lower lobe pulmonary nodule. Stable 9 mm right upper lobe ground-glass nodule (image 23; series 4). Stable 5 mm left lower lobe pulmonary nodule (image 31; series 4). Stable left upper lobe suture line.  Incidental imaging of the upper abdomen demonstrate postcholecystectomy changes. Bilateral renal cysts. Normal adrenal glands. No aggressive or acute appearing osseous lesions.  IMPRESSION: Stable CT of the chest.  No change in pulmonary nodules.  Emphysema   Electronically Signed   By: Drew  Davis M.D.   On: 12/28/2013 13:18  Ct Chest Wo Contrast  08/28/2013   CLINICAL DATA:  Followup lung   cancer  EXAM: CT CHEST WITHOUT CONTRAST  TECHNIQUE: Multidetector CT imaging of the chest was performed following the standard protocol without IV contrast.  COMPARISON:  06/16/2013  FINDINGS: No pleural effusion identified. Moderate to advanced changes of centrilobular emphysema identified. Pulmonary nodule within the right lower lobe is stable measuring 8 mm, image 44/series 5. Pulmonary nodule within the left lower lobe is stable measuring 5 mm, image 36/series 5. Peripheral scar like densities within the left lower lobe appear stable, image 31/series 5. Stable appearance of left upper lobe posterior suture line.  The heart size appears normal. There is no pericardial effusion. No mediastinal or hilar adenopathy. Calcified atherosclerotic disease involves the thoracic aorta.  Incidental imaging through the upper abdomen is significant for bilateral renal cysts. The adrenal glands appear normal. Patient is status post cholecystectomy.  Review of the visualized bony structures is  negative for aggressive lytic or sclerotic bone lesion.  IMPRESSION: 1. Stable CT of the chest. 2. No change in bilateral pulmonary nodules. 3. Atherosclerotic disease. 4. Emphysema.   Electronically Signed   By: Taylor  Stroud M.D.   On: 08/28/2013 14:27   Ct Chest Wo Contrast  06/16/2013   CLINICAL DATA:  Lung cancer. Prior left upper lobe resection. Right lung cancer with radiation. History of prostate cancer.  EXAM: CT CHEST WITHOUT CONTRAST  TECHNIQUE: Multidetector CT imaging of the chest was performed following the standard protocol without IV contrast.  COMPARISON:  02/27/2013  FINDINGS: Severe emphysematous changes in the lungs. Postsurgical changes in the posterior superior left upper lobe. 12 mm nodular area in the left apex, stable. Biapical scarring. Right lower lobe nodule on image 42 measures 8 mm, stable. Subpleural densities noted in the left lateral hemithorax are unchanged. No pleural effusions. No new or enlarging pulmonary nodules. Small nodule in the left lower lobe on image 36 measures 5 mm, stable.  Pacer is noted in place with leads in the right atrium and right ventricle, stable. Heart is normal size. Ascending aortic borderline in diameter at 3.9 cm. Small scattered mediastinal lymph nodes, none pathologically enlarged or changed since prior study. Chest wall soft tissues are unremarkable.  Large subcutaneous low-density lesion is noted posteriorly in the right posterior shoulder measuring approximately 6.2 cm, stable since PET CT. This presumably represents a large sebaceous cyst.  IMPRESSION: Stable postoperative changes in the posterior superior left upper lobe. Stable bilateral pulmonary nodules. No new or enlarging pulmonary nodules.  Mild slight dilatation of the ascending thoracic aorta measuring 4.0 cm.   Electronically Signed   By: Kevin  Dover M.D.   On: 06/16/2013 13:11  Ct Angio Head W/cm &/or Wo Cm  11/13/2012   *RADIOLOGY REPORT*  Clinical Data:  Numbness all over.   History of bilateral ICA pseudoaneurysms.  Headache.  THE PATIENT WAS PREMEDICATED WITH IV STEROIDS BECAUSE OF THE HISTORY OF CONTRAST ALLERGY.  THERE WERE NO ADVERSE EVENTS.  CT ANGIOGRAPHY HEAD AND NECK  Technique:  Multidetector CT imaging of the head and neck was performed using the standard protocol during bolus administration of intravenous contrast.  Multiplanar CT image reconstructions including MIPs were obtained to evaluate the vascular anatomy. Carotid stenosis measurements (when applicable) are obtained utilizing NASCET criteria, using the distal internal carotid diameter as the denominator.  Contrast: 50mL OMNIPAQUE IOHEXOL 350 MG/ML SOLN  Comparison:  08/25/2011 CT angio neck.  10/03/2008 CT angio head.  CTA NECK  Findings:  Right chest cardiac pacemaker.  COPD.  Spiculated left upper lobe lesion is new   from priors measuring 11 x 15 mm.  In the setting of chronic of tobacco abuse, concern raised for pulmonary neoplasm.  Previously identified and biopsied, spiculated lesion superior segment left lower lobe lesion, not well seen on today's examination.  Stable subcentimeter thyroid nodules.  Negative larynx.  Advanced spondylosis.  Three-vessel arch configuration without proximal stenosis.  Unremarkable right common carotid artery.  Mild nonstenotic posterior wall plaque.  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  No stenosis.  No significant left or right vertebral artery pathology.  Left vertebral dominant.  Unremarkable left common carotid artery.  Posterior wall plaque is non stenotic in the proximal left internal carotid artery. Rim calcified predominately thrombosed 7 x 7 x 8 mm more inferior cervical ICA aneurysm projecting medially is unchanged.  More superiorly, a  partially thrombosed and calcified skull base cervical ICA aneurysm measuring 8 x 10 x 11 mm is also stable.  Lateral thrombus extends cephalad to the level of the horizontal petrous/foramen lacerum level.  No ICA dissection.   Review of the MIP images confirms the above findings.  IMPRESSION:  Stable extracranial atherosclerotic change and stable bilateral cervical ICA pseudoaneurysms.  No evidence for enlargement, the dissection, or neck hemorrhage.  New spiculated left upper lobe 11 x 15 mm lesion; in the setting of COPD cannot exclude squamous cell carcinoma. As the patient has been premedicated, CT chest will be performed shortly with an additional 50 ml of contrast for expeditious  evaluation of the chest and mediastinum given his contrast allergy. Findings discussed with EDP.  CTA HEAD  Findings:  There is no evidence for acute infarction, intracranial hemorrhage, mass lesion, hydrocephalus, or extra-axial fluid.  Mild atrophy.  Mild white matter disease.  Post infusion, no abnormal enhancement.  Intact calvarium.  The  intracranial vasculature is dolichoectatic but patent.  No intracranial dissection or berry aneurysm is present.  The left vertebral is dominant.  There is no proximal vascular occlusion or significant flow reducing lesion. Similar appearance to priors.   Review of the MIP images confirms the above findings.  IMPRESSION: Unremarkable CTA head.   Original Report Authenticated By: John Curnes, M.D.   Ct Angio Neck W/cm &/or Wo/cm  11/13/2012   *RADIOLOGY REPORT*  Clinical Data:  Numbness all over.  History of bilateral ICA pseudoaneurysms.  Headache.  THE PATIENT WAS PREMEDICATED WITH IV STEROIDS BECAUSE OF THE HISTORY OF CONTRAST ALLERGY.  THERE WERE NO ADVERSE EVENTS.  CT ANGIOGRAPHY HEAD AND NECK  Technique:  Multidetector CT imaging of the head and neck was performed using the standard protocol during bolus administration of intravenous contrast.  Multiplanar CT image reconstructions including MIPs were obtained to evaluate the vascular anatomy. Carotid stenosis measurements (when applicable) are  obtained utilizing NASCET criteria, using the distal internal carotid diameter as the denominator.  Contrast: 50mL OMNIPAQUE IOHEXOL 350 MG/ML SOLN  Comparison:  08/25/2011 CT angio neck.  10/03/2008 CT angio head.  CTA NECK  Findings:  Right chest cardiac pacemaker.  COPD.  Spiculated left upper lobe lesion is new from priors measuring 11 x 15 mm.  In the setting of chronic of tobacco abuse, concern raised for pulmonary neoplasm.  Previously identified and biopsied, spiculated lesion superior segment left lower lobe lesion, not well seen on today's examination.  Stable subcentimeter thyroid nodules.  Negative larynx.  Advanced spondylosis.  Three-vessel arch configuration without proximal   stenosis.  Unremarkable right common carotid artery.  Mild nonstenotic posterior wall plaque.  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  No stenosis.  No significant left or right vertebral artery pathology.  Left vertebral dominant.  Unremarkable left common carotid artery.  Posterior wall plaque is non stenotic in the proximal left internal carotid artery. Rim calcified predominately thrombosed 7 x 7 x 8 mm more inferior cervical ICA aneurysm projecting medially is unchanged.  More superiorly, a  partially thrombosed and calcified skull base cervical ICA aneurysm measuring 8 x 10 x 11 mm is also stable. Lateral thrombus extends cephalad to the level of the horizontal petrous/foramen lacerum level.  No ICA dissection.   Review of the MIP images confirms the above findings.  IMPRESSION:  Stable extracranial atherosclerotic change and stable bilateral cervical ICA pseudoaneurysms.  No evidence for enlargement, the dissection, or neck hemorrhage.  New spiculated left upper lobe 11 x 15 mm lesion; in the setting of COPD cannot exclude squamous cell carcinoma. As the patient has been premedicated, CT chest will  be performed shortly with an additional 50 ml of contrast for expeditious  evaluation of the chest and mediastinum given his contrast allergy. Findings discussed with EDP.  CTA HEAD  Findings:  There is no evidence for acute infarction, intracranial hemorrhage, mass lesion, hydrocephalus, or extra-axial fluid.  Mild atrophy.  Mild white matter disease.  Post infusion, no abnormal enhancement.  Intact calvarium.  The  intracranial vasculature is dolichoectatic but patent.  No intracranial dissection or berry aneurysm is present.  The left vertebral is dominant.  There is no proximal vascular occlusion or significant flow reducing lesion. Similar appearance to priors.   Review of the MIP images confirms the above findings.  IMPRESSION: Unremarkable CTA head.   Original Report Authenticated By: John Curnes, M.D.   Ct Chest W Contrast  11/22/2012   *RADIOLOGY REPORT*  Clinical Data: Shortness of breath, weakness, abnormality on recently performed neck CTA.  CT CHEST WITH CONTRAST  Technique:  Multidetector CT imaging of the chest was performed following the standard protocol during bolus administration of intravenous contrast.  Contrast: 50mL OMNIPAQUE IOHEXOL 300 MG/ML  SOLN  Comparison: Neck CT head - earlier same day; chest CT - 07/14/2012; 09/03/2011  Findings:  Stable postsurgical change of the superior segment of the left lower lobe.  While the external diameter of the previous identified approximately 1.3 x 1.0 cm nodule within the left lung apex (image 13, series 3) is grossly unchanged, the nodule appears more consolidated on the present examination, with slight differences possibly attributable to slice selection.  Additional approximately 8 mm ground-glass nodule within the right lower lobe (image 43, series 3) appears grossly unchanged, as does an approximately 5 mm ground-glass nodule within the superior segment of the lingula (image 34, series 3).  No new discrete pulmonary nodules.  Scattered shoddy  mediastinal lymph nodes appear grossly unchanged and are individually not enlarged by size criteria with index pretracheal node measuring 9 mm in short axis diameter (image 23, series 2). No definite hilar or axillary lymphadenopathy.  Advanced centrilobular emphysematous change.  Minimal dependent subsegmental atelectasis.  No focal airspace opacity.  No pleural effusion or pneumothorax.  The central pulmonary airways are patent.  Normal heart size.  Right anterior chest wall dual lead pacemaker. No pericardial effusion.  Normal caliber of the main pulmonary artery.   Grossly unchanged mild ectasia of the ascending thoracic aorta measuring approximately 4.1 cm in greatest oblique short axis diameter (image 30, series 2).  Conventional configuration of the aortic arch.  Scattered minimal atherosclerotic plaque within the thoracic aorta.  No definite thoracic aortic dissection or periaortic stranding.  Limited evaluation of the upper abdomen demonstrates a partially exophytic approximately 3.0 cm cyst arising from the posterior superior aspect of the right kidney.  Several additional smaller renal cysts are noted bilaterally.  Post cholecystectomy.  No acute or aggressive osseous abnormalities.  IMPRESSION:  1.  No change to minimal increase in the indeterminate approximately 1.3 cm nodule within the left lung apex, with slight differences in possibly attributable to slice selection.  While possibly representing of scar, given advanced emphysematous change, a slowly growing lung cancer is not excluded.  As such, further evaluation with a follow-up chest CT in 3 months is recommended.  2.  Grossly unchanged bilateral sub centimeter ground-glass nodules, likely too small to accurately characterize.  Continued attention on follow-up is recommended.  3.  Stable postsurgical change of the superior segment of the left lower lobe.  4.  Grossly unchanged mild fusiform ectasia of the descending thoracic aorta measuring  approximately 41 mm in diameter.  Above findings discussed with Dr. Allen at the time of examination completion.   Original Report Authenticated By: John Watts V, MD   Ct Chest Wo Contrast  07/14/2012  *RADIOLOGY REPORT*  Clinical Data: Follow-up lung cancer.  CT CHEST WITHOUT CONTRAST  Technique:  Multidetector CT imaging of the chest was performed following the standard protocol without IV contrast.  Comparison: CT scan 08/18/2011.  Findings: The chest wall is stable.  A permanent right-sided pacemaker is noted.  No supraclavicular or axillary lymphadenopathy.  The bony thorax is intact.  No destructive bone lesions or spinal canal compromise.  Moderate degenerative changes involving the spine.  The heart is normal in size.  No pericardial effusion.  No mediastinal or hilar lymphadenopathy.  Stable tortuosity and ectasia of the thoracic aorta.  The esophagus is grossly normal.  Examination of the lung parenchyma demonstrates stable advanced emphysematous changes and pulmonary interstitial scarring.  The left upper lobe pulmonary nodule has been excised.  No findings for recurrent or residual tumor.  There is a new left apical lung density.  This could reflect progressive scarring change but does need observation.  There is a 7 mm nodule at the right lung base.  This is difficult to see on the prior CT but was a on the prior PET CT and needs continued observation.  There is an ill-defined nodular density measuring approximate 5 mm in the superior segment of the left lower lobe on image number 31. I believe this was present on the prior CT scan although it is more obvious now.  A 4 mm nodule is noted on image number 22 in the left upper lobe. This was present on the prior study and has not significantly changed.  No acute pulmonary findings or pleural effusion.  The upper abdomen is unremarkable.  Stable right renal cyst.  IMPRESSION:  1.  Surgical changes from prior excision of a left apical lung neoplasm. 2.   Advanced emphysematous changes and pulmonary scarring. 3.  New 13 x 8 mm left apical lung density.  This could reflect scarring change but needs close observation. 4.  Other small pulmonary nodules as discussed above.  Recommend continued observation. 5.  No acute pulmonary findings and no mediastinal or hilar   lymphadenopathy. 6.  Stable tortuosity and ectasia of the thoracic aorta.   Original Report Authenticated By: Marijo Sanes, M.D.       Recent Labs: Lab Results  Component Value Date   WBC 7.5 09/21/2013   HGB 9.2* 09/21/2013   HCT 26.4* 09/21/2013   PLT 229 09/21/2013   GLUCOSE 100* 09/21/2013   CHOL 192 07/22/2011   TRIG 76 07/22/2011   HDL 57 07/22/2011   LDLCALC 120* 07/22/2011   ALT 22 09/20/2013   AST 39* 09/20/2013   NA 139 09/21/2013   K 3.9 09/21/2013   CL 102 09/21/2013   CREATININE 0.88 09/21/2013   BUN 22 09/21/2013   CO2 27 09/21/2013   TSH 4.001 06/13/2011   INR 1.30 09/19/2013   HGBA1C 5.0 06/13/2011      Assessment / Plan:   Wedge resection of Stage I INVASIVE MODERATELY DIFFERENTIATED ADENOCARCINOMA, SPANNING 2.2 CM IN GREATEST DIMENSION superior segment left lower lobe 2013 The patient underwent stereotactic radiotherapy to the left upper lobe lesion. Completed 01/03/13  EGFR positive Follow up PET scan done last month does not suggest recurrence of lung cancer.- will see back in 6 months from now rather then April with follow up ct of chest in follow up of lung resection Patient is referred back to Dr Rayann Heman for cardiac evaluation     Keyly Baldonado B 05/17/2014 9:21 AM

## 2014-05-28 ENCOUNTER — Ambulatory Visit (INDEPENDENT_AMBULATORY_CARE_PROVIDER_SITE_OTHER): Payer: Medicare Other | Admitting: Physician Assistant

## 2014-05-28 ENCOUNTER — Encounter: Payer: Self-pay | Admitting: Physician Assistant

## 2014-05-28 VITALS — BP 130/74 | HR 115 | Ht 71.0 in | Wt 150.1 lb

## 2014-05-28 DIAGNOSIS — R000 Tachycardia, unspecified: Secondary | ICD-10-CM | POA: Insufficient documentation

## 2014-05-28 DIAGNOSIS — I10 Essential (primary) hypertension: Secondary | ICD-10-CM

## 2014-05-28 DIAGNOSIS — I471 Supraventricular tachycardia: Secondary | ICD-10-CM

## 2014-05-28 DIAGNOSIS — R079 Chest pain, unspecified: Secondary | ICD-10-CM

## 2014-05-28 DIAGNOSIS — I2699 Other pulmonary embolism without acute cor pulmonale: Secondary | ICD-10-CM

## 2014-05-28 LAB — CBC
HCT: 43.4 % (ref 39.0–52.0)
HEMOGLOBIN: 14.5 g/dL (ref 13.0–17.0)
MCHC: 33.4 g/dL (ref 30.0–36.0)
MCV: 87.3 fl (ref 78.0–100.0)
PLATELETS: 339 10*3/uL (ref 150.0–400.0)
RBC: 4.98 Mil/uL (ref 4.22–5.81)
RDW: 17.5 % — ABNORMAL HIGH (ref 11.5–15.5)
WBC: 9.7 10*3/uL (ref 4.0–10.5)

## 2014-05-28 LAB — COMPREHENSIVE METABOLIC PANEL
ALT: 81 U/L — ABNORMAL HIGH (ref 0–53)
AST: 23 U/L (ref 0–37)
Albumin: 3.6 g/dL (ref 3.5–5.2)
Alkaline Phosphatase: 237 U/L — ABNORMAL HIGH (ref 39–117)
BILIRUBIN TOTAL: 0.8 mg/dL (ref 0.2–1.2)
BUN: 10 mg/dL (ref 6–23)
CO2: 32 mEq/L (ref 19–32)
CREATININE: 1.04 mg/dL (ref 0.40–1.50)
Calcium: 11 mg/dL — ABNORMAL HIGH (ref 8.4–10.5)
Chloride: 106 mEq/L (ref 96–112)
GFR: 73.17 mL/min (ref >60.00)
Glucose, Bld: 133 mg/dL — ABNORMAL HIGH (ref 70–99)
Potassium: 3.5 mEq/L (ref 3.5–5.1)
Sodium: 140 mEq/L (ref 135–145)
Total Protein: 6.8 g/dL (ref 6.0–8.3)

## 2014-05-28 LAB — TSH: TSH: 2.26 u[IU]/mL (ref 0.35–4.50)

## 2014-05-28 MED ORDER — METOPROLOL TARTRATE 25 MG PO TABS: 12.5000 mg | ORAL_TABLET | Freq: Two times a day (BID) | ORAL | Status: DC

## 2014-05-28 NOTE — Patient Instructions (Signed)
Your physician has recommended you make the following change in your medication:    START TAKING LOPRESSOR 12.5 MG  TWICE A DAY   LABS TODAY CMET CBC TSH     FOLLOW UP WITH DR Rayann Heman IN 2 MONTHS

## 2014-05-28 NOTE — Progress Notes (Signed)
Cardiology Office Note   Date:  05/28/2014   ID:  [name], DOB 1932/06/17, MRN [id number]  PCP:  [name], MD  Cardiologist:  Dr. Rayann Heman Chief Complaint: Chest pain    History of Present Illness: [name] is a 81 y.o. male who presents for chest pain. He has a history of transient AV block status post pacemaker, nonsustained V. tach with preserved EF no beta blocker due to lung disease, hypertension, hyperlipidemia, prior pulmonary embolus with GI bleed on Coumadin but restarted by primary care. Cardiac catheterization in 2013 showed mild to moderate nonobstructive disease in the LAD, circumflex and RCA with ostial stenosis of a small to moderate size diagonal branch. This is a difficult location for PCI given the angle and ostial location,. He had preserved LV function. EF 55%. Medical therapy was recommended. Imdur was added at that time.  He has since been diagnosed with moderately differentiated adenocarcinoma and underwent surgical wedge resection in 2013. He went to see Dr. Pia Mau on 05/17/14 and was complaining of jaw pain chest discomfort and increased shortness of breath. Patient also had an enlarging lesion in the left upper lobe with biopsy proven adenocarcinoma treated with stereotactic radiotherapy in 2014. Recent PET scan in January 2016 does not suggest recurrence of lung cancer. He was referred here for chest pain.  Patient describes sharp shooting chest pains that are fleeting and occur once every 2 months or so. He sometimes has jaw pain but is associated with the chest pain and that occurs randomly. 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 He as some degree of dyspnea on exertion but this has not changed over the years. He has not used nitroglycerin. Overall he feels like his heart has been fine. He is tachycardic in the office today but is asymptomatic  with this. His main complaint is a rash over his entire torso for the past 5 months. He has seen multiple doctors and dermatologist to have not come up with a diagnosis. He says he is itching all the time. He also complains of fatigue and his feet hurting.    Past Medical History  Diagnosis Date  . Hyperlipidemia   . Hypertension   . Esophageal reflux   . Diverticulosis   . Rheumatic fever ~ 1944  . Vision loss of left eye     S/P "cataract OR"; "can see a little; not much"  . Kidney stones   . Hard of hearing, left   . Pulmonary embolism     On Xarelto  . Hypercalcemia   . Arthritis     "knees"  . CAD (coronary artery disease)     nonobstructive by cath 07/22/11 with small to moderate sized diagonal branch with ostial stenosis , medical therapy advised  . AV block     s/p PPM  . COPD (chronic obstructive pulmonary disease)   . Carotid artery aneurysm     Bilateral internal carotid artery aneurysms followed by Dr. Donnetta Hutching  . Shortness of breath     with ambulation  . Constipation   . Shingles 12/04/2012    right lateral anterior chest  . Radiation 10/7,10/9,01/03/2013    SBRT Left upper lobe lung  . Prostate cancer     s/p cryotherapy  . Lung cancer, primary, with metastasis from lung to other site dx'd 09/2011 & 11/2012  . Anemia     Past Surgical History  Procedure  Laterality Date  . Cystoscopy    . Lung surgery  1957    "born w/bleb in [place]  . Insert / replace / remove pacemaker  1991    initial placement  . Insert / replace / remove pacemaker  ~ 2011    "changed out"  . Tonsillectomy and adenoidectomy  1943  . Appendectomy  2000's  . Cholecystectomy  2000's  . Cataract extraction w/ intraocular lens implant  ~ 2008    left eye  . Lithotripsy  ~ 2011    "twice"  . Cardiac catheterization    . Prostate cryoablation  ~ 2008  . Video bronchoscopy  10/20/2011    Procedure: VIDEO BRONCHOSCOPY;  Surgeon: Grace Isaac, MD;  Location: [facility];  Service: Thoracic;   Laterality: N/A;  . Needle core biopsy  12/06/2012    Lung/lul  . Esophagogastroduodenoscopy N/A 09/20/2013    Procedure: ESOPHAGOGASTRODUODENOSCOPY (EGD);  Surgeon: Winfield Cunas., MD;  Location: [facility] ENDOSCOPY;  Service: Endoscopy;  Laterality: N/A;  . Left heart catheterization with coronary angiogram N/A 07/22/2011    Procedure: LEFT HEART CATHETERIZATION WITH CORONARY ANGIOGRAM;  Surgeon: Burnell Blanks, MD;  Location: [facility] CATH LAB;  Service: Cardiovascular;  Laterality: N/A;     Current Outpatient Prescriptions  Medication Sig Dispense Refill  . acetaminophen (TYLENOL) 325 MG tablet Take 2 tablets (650 mg total) by mouth every 6 (six) hours as needed for mild pain, fever or headache. (Patient not taking: Reported on 05/17/2014)    . albuterol (PROVENTIL,VENTOLIN) 90 MCG/ACT inhaler Inhale 2 puffs into the lungs every 6 (six) hours as needed for shortness of breath. For wheezing or shortness of breath    . diphenhydrAMINE (BENADRYL) 50 MG capsule Take 50 mg by mouth 1 hour prior to your procedure. (Patient not taking: Reported on 05/17/2014) 1 capsule 0  . DULoxetine (CYMBALTA) 60 MG capsule Take 60 mg by mouth daily.    Marland Kitchen losartan (COZAAR) 50 MG tablet Take 1 tablet by mouth daily.    . nitroGLYCERIN (NITROSTAT) 0.4 MG SL tablet Place 1 tablet (0.4 mg total) under the tongue every 5 (five) minutes as needed. For chest pain 30 tablet 3  . pantoprazole (PROTONIX) 40 MG tablet Take 1 tablet (40 mg total) by mouth 2 (two) times daily before a meal. (Patient not taking: Reported on 05/17/2014) 60 tablet 0  . predniSONE (DELTASONE) 50 MG tablet One tablet (50mg ) 13 hours prior to procedure; one tablet (50mg ) 7 hours prior to procedure and then one tablet (50 mg) one hour prior to procedure. (Patient not taking: Reported on 03/21/2014) 3 tablet 0  . rOPINIRole (REQUIP) 0.5 MG tablet Take 1 tablet (0.5 mg total) by mouth at bedtime. (Patient not taking: Reported on 05/17/2014) 30 tablet 0  .  warfarin (COUMADIN) 5 MG tablet Take 5 mg by mouth daily.     No current facility-administered medications for this visit.    Allergies:   Iohexol and Penicillins    Social History:  The patient  reports that he has been smoking Cigarettes.  He has a 50 pack-year smoking history. His smokeless tobacco use includes Chew. He reports that he does not drink alcohol or use illicit drugs.   Family History:  The patient's family history includes Brain cancer (age of onset: 68) in his father; Breast cancer in his mother; Cancer in his mother; Emphysema in his sister. There is no history of Hypertension, Hyperlipidemia, Heart failure, Heart disease, or Colon cancer.  ROS:  Please see the history of present illness.   Otherwise, review of systems are positive for none.   All other systems are reviewed and negative.    PHYSICAL EXAM: VS:  Ht 5\' 11"  (1.803 m)  Wt 150 lb 1.9 oz (68.094 kg)  BMI 20.95 kg/m2 , BMI Body mass index is 20.95 kg/(m^2). GEN: Well nourished, well developed, in no acute distress HEENT: normal Neck: no JVD, HJR, carotid bruits, or masses Cardiac: RRR at 115 bpm; no gallop ,murmurs, rubs, thrill or heave,no edema,   Respiratory:  clear to auscultation bilaterally, normal work of breathing GI: soft, nontender, nondistended, + BS MS: no deformity or atrophy Extremities: without cyanosis, clubbing, edema, good distal pulses bilaterally.  Skin: warm and dry, no rash Neuro:  Strength and sensation are intact Psych: euthymic mood, full affect   EKG:  EKG is ordered today. The ekg ordered today demonstrates sinus tachycardia at 115 bpm with PAC, LVH with repolarization changes, no acute change   Recent Labs: 09/20/2013: ALT 22 09/21/2013: BUN 22; Creatinine 0.88; Hemoglobin 9.2*; Platelets 229; Potassium 3.9; Sodium 139    Lipid Panel    Component Value Date/Time   CHOL 192 07/22/2011 0153   TRIG 76 07/22/2011 0153   HDL 57 07/22/2011 0153   CHOLHDL 3.4 07/22/2011  0153   VLDL 15 07/22/2011 0153   LDLCALC 120* 07/22/2011 0153      Wt Readings from Last 3 Encounters:  05/28/14 150 lb 1.9 oz (68.094 kg)  05/17/14 150 lb (68.04 kg)  12/28/13 168 lb (76.204 kg)      Other studies Reviewed: Additional studies/ records that were reviewed today include and review of the records demonstrates:  IMPRESSION: 1. A right paratracheal node is mildly hypermetabolic, but similar in size back to 2014. Favored to be reactive. 2. Otherwise, no evidence of hypermetabolic recurrent or metastatic disease. 3. Left lower lobe nodule which demonstrates low level, non malignant range hypermetabolism. Given stability back to 2014, likely benign. 4. Incidental findings, including borderline ascending aortic dilatation, probable left renal complex cyst, and right common iliac artery dilatation. 5. Subtle hypermetabolic nodule in the left parotid gland has been present back to 2014. Although this could represent a primary parotid neoplasm, stability suggests relative indolent behavior. This could be re-evaluated on follow-up or further characterized with contrast and neck CT.   Angiographic Findings:  Left main: No obstructive disease.    Left Anterior Descending Artery:  Large caliber vessel that courses to the apex. The mid vessel has 30% stenosis at the takeoff of the small to moderate sized diagonal branch. The diagonal branch is a small to moderate sized vessel with 70% ostial stenosis.    Circumflex Artery: Moderate sized intermediate branch with mild plaque disease. The Circumflex system is small to moderate sized with mild plaque.    Right Coronary Artery: Large dominant artery with mild plaque throughout the proximal and mid vessel.   Left Ventricular Angiogram: LVEF=55%.    Impression: 1. Mild to moderate non-obstructive disease in LAD, Circumflex and RCA 2. Ostial stenosis of small to moderate sized diagonal branch. This is a difficult location for  PCI given the angle and ostial location.   3. Preserved LV systolic function.    Recommendations: I would recommend continued medical management at this time. I will add Imdur. Restart Xarelto tonight at 6pm.          Complications:  None. The patient tolerated the procedure well.  ASSESSMENT AND PLAN:  Chest pain Patient's chest pain is somewhat atypical. He has had no chest pressure or tightness. He does have a history of mild to moderate nonobstructive CAD on cath in 2013 with ostial stenosis of a small to moderate sized diagonal branch difficult for PCI. Medical therapy recommended at that time. Patient does not feel like his pain is cardiac. It is fleeting and occurs once every 2 months or so. I discussed the patient in detail with Dr. Rayann Heman and don't feel further testing is indicated at this time. Patient is to call if he has any increase in his symptoms. Follow-up with Dr. Rayann Heman in 2 months.   Sinus tachycardia by electrocardiogram Patient has baseline sinus tachycardia. He is asymptomatic with this. Dr. Rayann Heman recommends trying low-dose beta blocker and checking labs for underlying cause. Will begin metoprolol 12.5 mg twice a day. Patient is to call if he has any increase in wheezing or dyspnea on this medication. Follow-up with Dr. Rayann Heman in 2 months.   PE (pulmonary embolism) 2005 on Coumadin Patient remains on Coumadin.   Lung cancer, upper lobe Left Recent PET scan did not show recurrence of CA.   Essential hypertension Blood pressure stable    Signed, Ermalinda Barrios, PA-C  05/28/2014 7:53 AM    [facility] [place], [place], [place]  [address] Phone: [phone]; Fax: [phone]

## 2014-05-28 NOTE — Assessment & Plan Note (Signed)
Recent PET scan did not show recurrence of CA.

## 2014-05-28 NOTE — Assessment & Plan Note (Signed)
Patient's chest pain is somewhat atypical. He has had no chest pressure or tightness. He does have a history of mild to moderate nonobstructive CAD on cath in 2013 with ostial stenosis of a small to moderate sized diagonal branch difficult for PCI. Medical therapy recommended at that time. Patient does not feel like his pain is cardiac. It is fleeting and occurs once every 2 months or so. I discussed the patient in detail with Dr. Rayann Heman and don't feel further testing is indicated at this time. Patient is to call if he has any increase in his symptoms. Follow-up with Dr. Rayann Heman in 2 months.

## 2014-05-28 NOTE — Assessment & Plan Note (Signed)
Patient remains on Coumadin.

## 2014-05-28 NOTE — Assessment & Plan Note (Signed)
Blood pressure stable ? ?

## 2014-05-28 NOTE — Assessment & Plan Note (Signed)
Patient has baseline sinus tachycardia. He is asymptomatic with this. Dr. Rayann Heman recommends trying low-dose beta blocker and checking labs for underlying cause. Will begin metoprolol 12.5 mg twice a day. Patient is to call if he has any increase in wheezing or dyspnea on this medication. Follow-up with Dr. Rayann Heman in 2 months.

## 2014-06-20 ENCOUNTER — Telehealth: Payer: Self-pay | Admitting: Cardiology

## 2014-06-20 ENCOUNTER — Encounter: Payer: Medicare Other | Admitting: *Deleted

## 2014-06-20 NOTE — Telephone Encounter (Signed)
LMOVM reminding pt to send remote transmission.   

## 2014-06-21 ENCOUNTER — Encounter: Payer: Self-pay | Admitting: Cardiology

## 2014-07-03 ENCOUNTER — Other Ambulatory Visit: Payer: Self-pay

## 2014-07-03 DIAGNOSIS — C3490 Malignant neoplasm of unspecified part of unspecified bronchus or lung: Secondary | ICD-10-CM

## 2014-08-01 ENCOUNTER — Ambulatory Visit: Payer: Medicare Other | Admitting: Internal Medicine

## 2014-08-16 ENCOUNTER — Ambulatory Visit
Admission: RE | Admit: 2014-08-16 | Discharge: 2014-08-16 | Disposition: A | Discharge status: Home / Self Care | Payer: Medicare Other | Source: Ambulatory Visit | Attending: Cardiothoracic Surgery | Admitting: Cardiothoracic Surgery

## 2014-08-16 ENCOUNTER — Encounter: Payer: Self-pay | Admitting: Cardiothoracic Surgery

## 2014-08-16 ENCOUNTER — Ambulatory Visit (INDEPENDENT_AMBULATORY_CARE_PROVIDER_SITE_OTHER): Payer: Medicare Other | Admitting: Cardiothoracic Surgery

## 2014-08-16 VITALS — BP 129/85 | HR 127 | Resp 20 | Ht 71.0 in | Wt 143.0 lb

## 2014-08-16 DIAGNOSIS — C3492 Malignant neoplasm of unspecified part of left bronchus or lung: Secondary | ICD-10-CM | POA: Diagnosis not present

## 2014-08-16 DIAGNOSIS — C3490 Malignant neoplasm of unspecified part of unspecified bronchus or lung: Secondary | ICD-10-CM

## 2014-08-16 NOTE — Progress Notes (Signed)
[address]       [place] [address]             [phone]                         Tyrek C Federici [place] Medical Record #[id number] Date of Birth: April 26, 1932  [name], * [name], MD  Chief Complaint:   PostOp Follow Up Visit 10/20/2011  DATE OF DISCHARGE:  OPERATIVE REPORT  Preop: Non small cell Lung cancer- left  Postop: same  SURGICAL PROCEDURE: Repeat left Mini thoracotomy, wedge resection, and  segmentectomy of the superior segment of the left lower lobe, and  placement of On-Q device.    pT1b, pNX, MX. INVASIVE MODERATELY DIFFERENTIATED ADENOCARCINOMA, SPANNING 2.2 CM IN GREATEST DIMENSION.   Wedge resection 10/20/2011  Stereotactic radio therapy to left upper lobe lesion in October 2014  Needle BX of left upper lobe lesion: [id number]  ALK negative, EGFR  positive Lung, needle/core biopsy(ies), LUL - POSITIVE FOR ADENOCARCINOMA.  History of Present Illness:     Patient returns to the office today in follow-up followup after surgical wedge resection of a T1 B. moderately differentiated adenocarcinoma the lung 2 cm in size located in the superior segment of the left lower lobe 10/20/2011. The patient previously had full left thoracotomy many years ago for "cyst".  He notes a 16 pound weight loss .  He is recently been evaluated by dermatology for a chronic rash including biopsy but with no firm treatment or diagnosis. In mid January he had a PET scan done.  An separate  enlarging lesion in the left upper lobe was biopsy proven adenocarcinoma and he underwent stereotactic radiotherapy in the fall of 2014  Patient returns today with a followup CT scan following surgical resection of a left lower lobe lung adenocarcinoma in July of 2013 and subsequent stereotactic radiotherapy for a second primary in the left upper lobe.  Patient  Wife continues to be treated for has stage IV liver cancer after a prolonged  hospitalization for perforated bowel following hernia repair, Patient wife is seeing hospice today  History  Smoking status  . Current Some Day Smoker -- 1.00 packs/day for 50 years  . Types: Cigarettes  . Last Attempt to Quit: 03/23/2004  Smokeless tobacco  . Current User  . Types: Chew       Allergies  Allergen Reactions  . Iohexol      Desc: PT STATED TODAY HE GETS SLIGHT SOB FROM IV CONTRAST. HE NEEDS FULL PREMEDS FROM NOW ON PER DR. MATTERN   . Penicillins Itching and Rash    "haven't took none in 40 years"    Current Outpatient Prescriptions  Medication Sig Dispense Refill  . albuterol (PROVENTIL HFA;VENTOLIN HFA) 108 (90 BASE) MCG/ACT inhaler Inhale 2 puffs into the lungs every 6 (six) hours as needed for wheezing or shortness of breath.    . escitalopram (LEXAPRO) 20 MG tablet Take 20 mg by mouth at bedtime.     . hydrOXYzine (ATARAX/VISTARIL) 50 MG tablet Take 50 mg by mouth every 6 (six) hours. Take 2 tablets every 6 hours by mouth daily.    Marland Kitchen losartan (COZAAR) 50 MG tablet Take 1 tablet by mouth daily.    . nitroGLYCERIN (NITROSTAT) 0.4 MG SL tablet Place 1 tablet (0.4 mg total) under the tongue every 5 (five) minutes as needed. For chest pain 30 tablet 3  .  tamsulosin (FLOMAX) 0.4 MG CAPS capsule 0.4 mg daily.     Marland Kitchen warfarin (COUMADIN) 5 MG tablet Take 5 mg by mouth daily.    Marland Kitchen zolpidem (AMBIEN) 5 MG tablet Take 5 mg by mouth at bedtime as needed for sleep.     No current facility-administered medications for this visit.       Physical Exam: BP 129/85 mmHg  Pulse 127  Resp 20  Ht $R'5\' 11"'li$  (1.803 m)  Wt 143 lb (64.864 kg)  BMI 19.95 kg/m2  SpO2 98% Patient in sinus rhythm on palpation today General appearance: alert and cooperative Neurologic: intact Heart: regular rate and rhythm, S1, S2 normal, no murmur, click, rub or gallop and normal apical impulse Lungs: clear to auscultation bilaterally and normal percussion bilaterally Abdomen: soft, non-tender;  bowel sounds normal; no masses,  no organomegaly Extremities: extremities normal, atraumatic, no cyanosis or edema and Homans sign is negative, no sign of DVT Wounds:well healed.  No cervical or supraclavicular or axillary adenopathy  a fine papular rash over his torso  Diagnostic Studies & Laboratory data:         Recent Radiology Findings:  Ct Chest Wo Contrast  08/16/2014   CLINICAL DATA:  Left lower lobe lung cancer restaging  EXAM: CT CHEST WITHOUT CONTRAST  TECHNIQUE: Multidetector CT imaging of the chest was performed following the standard protocol without IV contrast.  COMPARISON:  05/18/2014 CT abdomen/ pelvis, most recent chest CT 12/28/2013  FINDINGS: Mediastinum/Nodes: Right-sided pacer in place. Ascending aortic ectasia measuring 4.2 cm image 33 again noted.  Heart size is normal. High pretracheal lymph node measuring 0.7 cm stable. No new hilar or mediastinal lymphadenopathy. No pericardial effusion.  Lungs/Pleura: Emphysematous changes are noted. Nodular ground-glass opacity right lower lobe measuring 0.8 cm image 47 is stable. 3 mm left upper lobe pulmonary nodule image 26 is stable. Subpleural nodularity predominantly in the left upper and lower lobes is subjectively stable. Stable left lower lobe 0.5 cm nodule image 38. Left upper lobe/perihilar linear presumed radiation fibrosis/scarring is stable. No pleural effusion.  Upper abdomen: Bilateral renal cortical cysts are again noted, partly visualized.  Musculoskeletal: No lytic or sclerotic osseous lesion. No acute osseous abnormality. No compression deformity.  IMPRESSION: No significant change in bilateral pulmonary nodules or presumed post treatment change without acute abnormality or evidence for intrathoracic recurrence or metastasis.   Electronically Signed   By: Conchita Paris M.D.   On: 08/16/2014 09:48   I have independently reviewed the above radiology studies  and reviewed the findings with the  patient.     PET:04/02/2014 CLINICAL DATA: Subsequent treatment strategy for restaging of lung cancer.  EXAM: NUCLEAR MEDICINE PET SKULL BASE TO THIGH  TECHNIQUE: 7.8 mCi F-18 FDG was injected intravenously. Full-ring PET imaging was performed from the skull base to thigh after the radiotracer. CT data was obtained and used for attenuation correction and anatomic localization.  FASTING BLOOD GLUCOSE: Value: 85 mg/dl  COMPARISON: PET of 12/01/2012. Most recent chest CT 12/28/2013.  FINDINGS: NECK  Hypermetabolism which corresponds to a posterior left parotid nodule. This measures 6 mm in a S.U.V. max of 5.9 on image 30. On the prior PET of 12/01/2012, this measured similar in size and was hypermetabolic.  No hypermetabolic cervical nodes.  CHEST  Mild hypermetabolism corresponding to left apical pleural parenchymal thickening. A left lower lobe vague 6 mm nodule corresponds to low level, non malignant range hypermetabolism. This measures a S.U.V. max of 0.8 on image 41. This nodule  was similar in size on the prior CT and has been present back to 12/01/2012.  Low right paratracheal node measures 9 mm and a S.U.V. max of 3.4 on image 76. This node measured similar in size back to 12/01/2012.  ABDOMEN/PELVIS  No areas of abnormal hypermetabolism.  SKELETON  Facet arthropathy involving the left upper cervical spine. Concurrent hypermetabolism. No suspicious osseous abnormalities.  CT IMAGES PERFORMED FOR ATTENUATION CORRECTION  Cerebral atrophy. No cervical adenopathy. Presumed sebaceous cyst about the posterior right neck at 5.6 cm.  Mild cardiomegaly. Ascending aorta upper normal in size, 3.8 cm. Similar. Aortic and branch vessel atherosclerosis. Pacer. Centrilobular emphysema. Right lower lobe 7 mm ground-glass nodule on image 48, similar. 4 mm left lower lobe nodule on image 27, similar.  Normal adrenal glands. Bilateral renal cysts.  Bilateral nephrolithiasis. 11 mm hyper attenuating left renal lesion on image 135 is unchanged back to 12/01/2012. Cholecystectomy. Right common iliac artery dilatation is similar at 1.6 cm. Mild prostatomegaly. Right hydrocele. Left thoracotomy changes.  IMPRESSION: 1. A right paratracheal node is mildly hypermetabolic, but similar in size back to 2014. Favored to be reactive. 2. Otherwise, no evidence of hypermetabolic recurrent or metastatic disease. 3. Left lower lobe nodule which demonstrates low level, non malignant range hypermetabolism. Given stability back to 2014, likely benign. 4. Incidental findings, including borderline ascending aortic dilatation, probable left renal complex cyst, and right common iliac artery dilatation. 5. Subtle hypermetabolic nodule in the left parotid gland has been present back to 2014. Although this could represent a primary parotid neoplasm, stability suggests relative indolent behavior. This could be re-evaluated on follow-up or further characterized with contrast and neck CT.   Electronically Signed  By: Abigail Miyamoto M.D.  On: 04/02/2014 12:58 Ct Chest Wo Contrast  12/28/2013   CLINICAL DATA:  Patient with history of lung cancer in 2013. Recurrence in 2014. Cough, mild shortness of breath.  EXAM: CT CHEST WITHOUT CONTRAST  TECHNIQUE: Multidetector CT imaging of the chest was performed following the standard protocol without IV contrast.  COMPARISON:  Chest CT 06/16/2013  FINDINGS: Pacer apparatus within the right anterior hemi thorax. Normal heart size. No pericardial effusion. No mediastinal or hilar adenopathy. Stable prominent sub cm mediastinal lymph nodes. Calcified atherosclerotic plaque involving the thoracic aorta.  Central airways are patent. Extensive diffuse centrilobular emphysema. Stable 8 mm right lower lobe pulmonary nodule. Stable 9 mm right upper lobe ground-glass nodule (image 23; series 4). Stable 5 mm left lower lobe  pulmonary nodule (image 31; series 4). Stable left upper lobe suture line.  Incidental imaging of the upper abdomen demonstrate postcholecystectomy changes. Bilateral renal cysts. Normal adrenal glands. No aggressive or acute appearing osseous lesions.  IMPRESSION: Stable CT of the chest.  No change in pulmonary nodules.  Emphysema   Electronically Signed   By: Lovey Newcomer M.D.   On: 12/28/2013 13:18  Ct Chest Wo Contrast  08/28/2013   CLINICAL DATA:  Followup lung cancer  EXAM: CT CHEST WITHOUT CONTRAST  TECHNIQUE: Multidetector CT imaging of the chest was performed following the standard protocol without IV contrast.  COMPARISON:  06/16/2013  FINDINGS: No pleural effusion identified. Moderate to advanced changes of centrilobular emphysema identified. Pulmonary nodule within the right lower lobe is stable measuring 8 mm, image 44/series 5. Pulmonary nodule within the left lower lobe is stable measuring 5 mm, image 36/series 5. Peripheral scar like densities within the left lower lobe appear stable, image 31/series 5. Stable appearance of left upper lobe posterior suture  line.  The heart size appears normal. There is no pericardial effusion. No mediastinal or hilar adenopathy. Calcified atherosclerotic disease involves the thoracic aorta.  Incidental imaging through the upper abdomen is significant for bilateral renal cysts. The adrenal glands appear normal. Patient is status post cholecystectomy.  Review of the visualized bony structures is negative for aggressive lytic or sclerotic bone lesion.  IMPRESSION: 1. Stable CT of the chest. 2. No change in bilateral pulmonary nodules. 3. Atherosclerotic disease. 4. Emphysema.   Electronically Signed   By: Kerby Moors M.D.   On: 08/28/2013 14:27   Ct Chest Wo Contrast  06/16/2013   CLINICAL DATA:  Lung cancer. Prior left upper lobe resection. Right lung cancer with radiation. History of prostate cancer.  EXAM: CT CHEST WITHOUT CONTRAST  TECHNIQUE: Multidetector CT  imaging of the chest was performed following the standard protocol without IV contrast.  COMPARISON:  02/27/2013  FINDINGS: Severe emphysematous changes in the lungs. Postsurgical changes in the posterior superior left upper lobe. 12 mm nodular area in the left apex, stable. Biapical scarring. Right lower lobe nodule on image 42 measures 8 mm, stable. Subpleural densities noted in the left lateral hemithorax are unchanged. No pleural effusions. No new or enlarging pulmonary nodules. Small nodule in the left lower lobe on image 36 measures 5 mm, stable.  Pacer is noted in place with leads in the right atrium and right ventricle, stable. Heart is normal size. Ascending aortic borderline in diameter at 3.9 cm. Small scattered mediastinal lymph nodes, none pathologically enlarged or changed since prior study. Chest wall soft tissues are unremarkable.  Large subcutaneous low-density lesion is noted posteriorly in the right posterior shoulder measuring approximately 6.2 cm, stable since PET CT. This presumably represents a large sebaceous cyst.  IMPRESSION: Stable postoperative changes in the posterior superior left upper lobe. Stable bilateral pulmonary nodules. No new or enlarging pulmonary nodules.  Mild slight dilatation of the ascending thoracic aorta measuring 4.0 cm.   Electronically Signed   By: Rolm Baptise M.D.   On: 06/16/2013 13:11  Ct Angio Head W/cm &/or Wo Cm  11/13/2012   *RADIOLOGY REPORT*  Clinical Data:  Numbness all over.  History of bilateral ICA pseudoaneurysms.  Headache.  THE PATIENT WAS PREMEDICATED WITH IV STEROIDS BECAUSE OF THE HISTORY OF CONTRAST ALLERGY.  THERE WERE NO ADVERSE EVENTS.  CT ANGIOGRAPHY HEAD AND NECK  Technique:  Multidetector CT imaging of the head and neck was performed using the standard protocol during bolus administration of intravenous contrast.  Multiplanar CT image reconstructions including MIPs were obtained to evaluate the vascular anatomy. Carotid stenosis  measurements (when applicable) are obtained utilizing NASCET criteria, using the distal internal carotid diameter as the denominator.  Contrast: 39mL OMNIPAQUE IOHEXOL 350 MG/ML SOLN  Comparison:  08/25/2011 CT angio neck.  10/03/2008 CT angio head.  CTA NECK  Findings:  Right chest cardiac pacemaker.  COPD.  Spiculated left upper lobe lesion is new from priors measuring 11 x 15 mm.  In the setting of chronic of tobacco abuse, concern raised for pulmonary neoplasm.  Previously identified and biopsied, spiculated lesion superior segment left lower lobe lesion, not well seen on today's examination.  Stable subcentimeter thyroid nodules.  Negative larynx.  Advanced spondylosis.  Three-vessel arch configuration without proximal stenosis.  Unremarkable right common carotid artery.  Mild nonstenotic posterior wall plaque.  Tortuous cervical right internal carotid artery with a fusiform upper cervical ICA pseudoaneurysm measuring 8 x 8 x 9 mm (image 119 series  6) is stable from priors.  No stenosis.  No significant left or right vertebral artery pathology.  Left vertebral dominant.  Unremarkable left common carotid artery.  Posterior wall plaque is non stenotic in the proximal left internal carotid artery. Rim calcified predominately thrombosed 7 x 7 x 8 mm more inferior cervical ICA aneurysm projecting medially is unchanged.  More superiorly, a  partially thrombosed and calcified skull base cervical ICA aneurysm measuring 8 x 10 x 11 mm is also stable. Lateral thrombus extends cephalad to the level of the horizontal petrous/foramen lacerum level.  No ICA dissection.   Review of the MIP images confirms the above findings.  IMPRESSION:  Stable extracranial atherosclerotic change and stable bilateral cervical ICA pseudoaneurysms.  No evidence for enlargement, the dissection, or neck hemorrhage.  New spiculated left upper lobe 11 x 15 mm lesion; in the setting of COPD cannot exclude squamous cell carcinoma. As the patient has  been premedicated, CT chest will be performed shortly with an additional 50 ml of contrast for expeditious  evaluation of the chest and mediastinum given his contrast allergy. Findings discussed with EDP.  CTA HEAD  Findings:  There is no evidence for acute infarction, intracranial hemorrhage, mass lesion, hydrocephalus, or extra-axial fluid.  Mild atrophy.  Mild white matter disease.  Post infusion, no abnormal enhancement.  Intact calvarium.  The  intracranial vasculature is dolichoectatic but patent.  No intracranial dissection or berry aneurysm is present.  The left vertebral is dominant.  There is no proximal vascular occlusion or significant flow reducing lesion. Similar appearance to priors.   Review of the MIP images confirms the above findings.  IMPRESSION: Unremarkable CTA head.   Original Report Authenticated By: Rolla Flatten, M.D.   Ct Angio Neck W/cm &/or Wo/cm  11/13/2012   *RADIOLOGY REPORT*  Clinical Data:  Numbness all over.  History of bilateral ICA pseudoaneurysms.  Headache.  THE PATIENT WAS PREMEDICATED WITH IV STEROIDS BECAUSE OF THE HISTORY OF CONTRAST ALLERGY.  THERE WERE NO ADVERSE EVENTS.  CT ANGIOGRAPHY HEAD AND NECK  Technique:  Multidetector CT imaging of the head and neck was performed using the standard protocol during bolus administration of intravenous contrast.  Multiplanar CT image reconstructions including MIPs were obtained to evaluate the vascular anatomy. Carotid stenosis measurements (when applicable) are obtained utilizing NASCET criteria, using the distal internal carotid diameter as the denominator.  Contrast: 85mL OMNIPAQUE IOHEXOL 350 MG/ML SOLN  Comparison:  08/25/2011 CT angio neck.  10/03/2008 CT angio head.  CTA NECK  Findings:  Right chest cardiac pacemaker.  COPD.  Spiculated left upper lobe lesion is new from priors measuring 11 x 15 mm.  In the setting of chronic of tobacco abuse, concern raised for pulmonary neoplasm.  Previously identified and biopsied,  spiculated lesion superior segment left lower lobe lesion, not well seen on today's examination.  Stable subcentimeter thyroid nodules.  Negative larynx.  Advanced spondylosis.  Three-vessel arch configuration without proximal stenosis.  Unremarkable right common carotid artery.  Mild nonstenotic posterior wall plaque.  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  No stenosis.  No significant left or right vertebral artery pathology.  Left vertebral dominant.  Unremarkable left common carotid artery.  Posterior wall plaque is non stenotic in the proximal left internal carotid artery. Rim calcified predominately thrombosed 7 x 7 x 8 mm more inferior cervical ICA aneurysm projecting  medially is unchanged.  More superiorly, a  partially thrombosed and calcified skull base cervical ICA aneurysm measuring 8 x 10 x 11 mm is also stable. Lateral thrombus extends cephalad to the level of the horizontal petrous/foramen lacerum level.  No ICA dissection.   Review of the MIP images confirms the above findings.  IMPRESSION:  Stable extracranial atherosclerotic change and stable bilateral cervical ICA pseudoaneurysms.  No evidence for enlargement, the dissection, or neck hemorrhage.  New spiculated left upper lobe 11 x 15 mm lesion; in the setting of COPD cannot exclude squamous cell carcinoma. As the patient has been premedicated, CT chest will be performed shortly with an additional 50 ml of contrast for expeditious  evaluation of the chest and mediastinum given his contrast allergy. Findings discussed with EDP.  CTA HEAD  Findings:  There is no evidence for acute infarction, intracranial hemorrhage, mass lesion, hydrocephalus, or extra-axial fluid.  Mild atrophy.  Mild white matter disease.  Post infusion, no abnormal enhancement.  Intact calvarium.  The  intracranial vasculature is dolichoectatic but  patent.  No intracranial dissection or berry aneurysm is present.  The left vertebral is dominant.  There is no proximal vascular occlusion or significant flow reducing lesion. Similar appearance to priors.   Review of the MIP images confirms the above findings.  IMPRESSION: Unremarkable CTA head.   Original Report Authenticated By: Rolla Flatten, M.D.   Ct Chest W Contrast  11/22/2012   *RADIOLOGY REPORT*  Clinical Data: Shortness of breath, weakness, abnormality on recently performed neck CTA.  CT CHEST WITH CONTRAST  Technique:  Multidetector CT imaging of the chest was performed following the standard protocol during bolus administration of intravenous contrast.  Contrast: 27mL OMNIPAQUE IOHEXOL 300 MG/ML  SOLN  Comparison: Neck CT head - earlier same day; chest CT - 07/14/2012; 09/03/2011  Findings:  Stable postsurgical change of the superior segment of the left lower lobe.  While the external diameter of the previous identified approximately 1.3 x 1.0 cm nodule within the left lung apex (image 13, series 3) is grossly unchanged, the nodule appears more consolidated on the present examination, with slight differences possibly attributable to slice selection.  Additional approximately 8 mm ground-glass nodule within the right lower lobe (image 43, series 3) appears grossly unchanged, as does an approximately 5 mm ground-glass nodule within the superior segment of the lingula (image 34, series 3).  No new discrete pulmonary nodules.  Scattered shoddy mediastinal lymph nodes appear grossly unchanged and are individually not enlarged by size criteria with index pretracheal node measuring 9 mm in short axis diameter (image 23, series 2). No definite hilar or axillary lymphadenopathy.  Advanced centrilobular emphysematous change.  Minimal dependent subsegmental atelectasis.  No focal airspace opacity.  No pleural effusion or pneumothorax.  The central pulmonary airways are patent.  Normal heart size.  Right anterior  chest wall dual lead pacemaker. No pericardial effusion.  Normal caliber of the main pulmonary artery. Grossly unchanged mild ectasia of the ascending thoracic aorta measuring approximately 4.1 cm in greatest oblique short axis diameter (image 30, series 2).  Conventional configuration of the aortic arch.  Scattered minimal atherosclerotic plaque within the thoracic aorta.  No definite thoracic aortic dissection or periaortic stranding.  Limited evaluation of the upper abdomen demonstrates a partially exophytic approximately 3.0 cm cyst arising from the posterior superior aspect of the right kidney.  Several additional smaller renal cysts are noted bilaterally.  Post cholecystectomy.  No acute or aggressive osseous abnormalities.  IMPRESSION:  1.  No change to minimal increase in the indeterminate approximately 1.3 cm nodule within the left lung apex, with slight differences in possibly attributable to slice selection.  While possibly representing of scar, given advanced emphysematous change, a slowly growing lung cancer is not excluded.  As such, further evaluation with a follow-up chest CT in 3 months is recommended.  2.  Grossly unchanged bilateral sub centimeter ground-glass nodules, likely too small to accurately characterize.  Continued attention on follow-up is recommended.  3.  Stable postsurgical change of the superior segment of the left lower lobe.  4.  Grossly unchanged mild fusiform ectasia of the descending thoracic aorta measuring approximately 41 mm in diameter.  Above findings discussed with Dr. Freida Busman at the time of examination completion.   Original Report Authenticated By: Tacey Ruiz, MD   Ct Chest Wo Contrast  07/14/2012  *RADIOLOGY REPORT*  Clinical Data: Follow-up lung cancer.  CT CHEST WITHOUT CONTRAST  Technique:  Multidetector CT imaging of the chest was performed following the standard protocol without IV contrast.  Comparison: CT scan 08/18/2011.  Findings: The chest wall is stable.   A permanent right-sided pacemaker is noted.  No supraclavicular or axillary lymphadenopathy.  The bony thorax is intact.  No destructive bone lesions or spinal canal compromise.  Moderate degenerative changes involving the spine.  The heart is normal in size.  No pericardial effusion.  No mediastinal or hilar lymphadenopathy.  Stable tortuosity and ectasia of the thoracic aorta.  The esophagus is grossly normal.  Examination of the lung parenchyma demonstrates stable advanced emphysematous changes and pulmonary interstitial scarring.  The left upper lobe pulmonary nodule has been excised.  No findings for recurrent or residual tumor.  There is a new left apical lung density.  This could reflect progressive scarring change but does need observation.  There is a 7 mm nodule at the right lung base.  This is difficult to see on the prior CT but was a on the prior PET CT and needs continued observation.  There is an ill-defined nodular density measuring approximate 5 mm in the superior segment of the left lower lobe on image number 31. I believe this was present on the prior CT scan although it is more obvious now.  A 4 mm nodule is noted on image number 22 in the left upper lobe. This was present on the prior study and has not significantly changed.  No acute pulmonary findings or pleural effusion.  The upper abdomen is unremarkable.  Stable right renal cyst.  IMPRESSION:  1.  Surgical changes from prior excision of a left apical lung neoplasm. 2.  Advanced emphysematous changes and pulmonary scarring. 3.  New 13 x 8 mm left apical lung density.  This could reflect scarring change but needs close observation. 4.  Other small pulmonary nodules as discussed above.  Recommend continued observation. 5.  No acute pulmonary findings and no mediastinal or hilar lymphadenopathy. 6.  Stable tortuosity and ectasia of the thoracic aorta.   Original Report Authenticated By: Rudie Meyer, M.D.       Recent Labs: Lab Results   Component Value Date   WBC 9.7 05/28/2014   HGB 14.5 05/28/2014   HCT 43.4 05/28/2014   PLT 339.0 05/28/2014   GLUCOSE 133* 05/28/2014   CHOL 192 07/22/2011   TRIG 76 07/22/2011   HDL 57 07/22/2011   LDLCALC 120* 07/22/2011   ALT 81* 05/28/2014   AST 23 05/28/2014   NA 140 05/28/2014  K 3.5 05/28/2014   CL 106 05/28/2014   CREATININE 1.04 05/28/2014   BUN 10 05/28/2014   CO2 32 05/28/2014   TSH 2.26 05/28/2014   INR 1.30 09/19/2013   HGBA1C 5.0 06/13/2011      Assessment / Plan:   Wedge resection of Stage I INVASIVE MODERATELY DIFFERENTIATED ADENOCARCINOMA, SPANNING 2.2 CM IN GREATEST DIMENSION superior segment left lower lobe 2013 The patient underwent stereotactic radiotherapy to the left upper lobe lesion. Completed 01/03/13  EGFR positive Follow up PET scan done last month does not suggest recurrence of lung cancer.- Follow up ct today shows  stable nodules  Patient was  referred back to Dr Rayann Heman for cardiac evaluation at his last appointment here  but has not yet been seen. Will see in 6 months with follow up ct of the chest  Grace Isaac 08/16/2014 10:00 AM

## 2014-09-10 ENCOUNTER — Encounter: Payer: Self-pay | Admitting: Internal Medicine

## 2014-09-10 ENCOUNTER — Ambulatory Visit (INDEPENDENT_AMBULATORY_CARE_PROVIDER_SITE_OTHER): Payer: Medicare Other | Admitting: Internal Medicine

## 2014-09-10 VITALS — BP 134/78 | HR 90 | Ht 71.0 in | Wt 139.2 lb

## 2014-09-10 DIAGNOSIS — I25119 Atherosclerotic heart disease of native coronary artery with unspecified angina pectoris: Secondary | ICD-10-CM

## 2014-09-10 DIAGNOSIS — I472 Ventricular tachycardia: Secondary | ICD-10-CM

## 2014-09-10 DIAGNOSIS — I442 Atrioventricular block, complete: Secondary | ICD-10-CM | POA: Diagnosis not present

## 2014-09-10 DIAGNOSIS — Z950 Presence of cardiac pacemaker: Secondary | ICD-10-CM

## 2014-09-10 DIAGNOSIS — I2699 Other pulmonary embolism without acute cor pulmonale: Secondary | ICD-10-CM

## 2014-09-10 DIAGNOSIS — I4729 Other ventricular tachycardia: Secondary | ICD-10-CM

## 2014-09-10 DIAGNOSIS — M79606 Pain in leg, unspecified: Secondary | ICD-10-CM

## 2014-09-10 DIAGNOSIS — R079 Chest pain, unspecified: Secondary | ICD-10-CM

## 2014-09-10 LAB — CUP PACEART INCLINIC DEVICE CHECK
Battery Remaining Longevity: 108 mo
Battery Voltage: 2.93 V
Brady Statistic RV Percent Paced: 0.02 %
Date Time Interrogation Session: 20160620132400
Lead Channel Pacing Threshold Amplitude: 1 V
Lead Channel Sensing Intrinsic Amplitude: 4.8 mV
Lead Channel Setting Pacing Amplitude: 2 V
MDC IDC MSMT LEADCHNL RA IMPEDANCE VALUE: 450 Ohm
MDC IDC MSMT LEADCHNL RA PACING THRESHOLD PULSEWIDTH: 1 ms
MDC IDC MSMT LEADCHNL RV IMPEDANCE VALUE: 362.5 Ohm
MDC IDC MSMT LEADCHNL RV PACING THRESHOLD AMPLITUDE: 1.5 V
MDC IDC MSMT LEADCHNL RV PACING THRESHOLD PULSEWIDTH: 0.9 ms
MDC IDC MSMT LEADCHNL RV SENSING INTR AMPL: 11.3 mV
MDC IDC SET LEADCHNL RV PACING AMPLITUDE: 2.5 V
MDC IDC SET LEADCHNL RV PACING PULSEWIDTH: 0.9 ms
MDC IDC SET LEADCHNL RV SENSING SENSITIVITY: 2 mV
MDC IDC STAT BRADY RA PERCENT PACED: 1 %
Pulse Gen Serial Number: 7167612

## 2014-09-10 MED ORDER — NITROGLYCERIN 0.4 MG SL SUBL: 0.4000 mg | SUBLINGUAL_TABLET | SUBLINGUAL | Status: AC | PRN

## 2014-09-10 NOTE — Patient Instructions (Addendum)
Medication Instructions:  Your physician recommends that you continue on your current medications as directed. Please refer to the Current Medication list given to you today.   Labwork: None ordered  Testing/Procedures: Your physician has requested that you have a lower extremity arterial exercise duplex. During this test, exercise and ultrasound are used to evaluate arterial blood flow in the legs. Allow one hour for this exam. There are no restrictions or special instructions.  Your physician has requested that you have a lexiscan myoview. For further information please visit [email or web address]. Please follow instruction sheet, as given.    Follow-Up:  Your physician recommends that you schedule a follow-up appointment in: 4 weeks with Truitt Merle, NP  Your physician wants you to follow-up in: 12 months with Dr Vallery Ridge will receive a reminder letter in the mail two months in advance. If you don't receive a letter, please call our office to schedule the follow-up appointment.  Remote monitoring is used to monitor your Pacemaker or ICD from home. This monitoring reduces the number of office visits required to check your device to one time per year. It allows [place] to keep an eye on the functioning of your device to ensure it is working properly. You are scheduled for a device check from home on 12/10/14. You may send your transmission at any time that day. If you have a wireless device, the transmission will be sent automatically. After your physician reviews your transmission, you will receive a postcard with your next transmission date.    Any Other Special Instructions Will Be Listed Below (If Applicable).

## 2014-09-11 NOTE — Progress Notes (Signed)
Electrophysiology Office Note   Date:  09/11/2014   ID:  [name], DOB 10-20-1932, MRN [id number]  PCP:  [name], MD   Primary Electrophysiologist: Thompson Grayer, MD    Chief Complaint  Patient presents with  . Complete AV block     History of Present Illness: [name] is a 81 y.o. male who presents today for electrophysiology evaluation.   He reports exertional chest pain with occasional radiation into his jaws.  He also has chronic SOB though he admits that this could be due to his chronic lung disease/ prior lung CA. He also has pain in his legs when he walks.  Today, he denies symptoms of palpitations, dizziness, presyncope, syncope, bleeding, or neurologic sequela. The patient is tolerating medications without difficulties and is otherwise without complaint today.    Past Medical History  Diagnosis Date  . Hyperlipidemia   . Hypertension   . Esophageal reflux   . Diverticulosis   . Rheumatic fever ~ 1944  . Vision loss of left eye     S/P "cataract OR"; "can see a little; not much"  . Kidney stones   . Hard of hearing, left   . Pulmonary embolism     On Xarelto  . Hypercalcemia   . Arthritis     "knees"  . CAD (coronary artery disease)     nonobstructive by cath 07/22/11 with small to moderate sized diagonal branch with ostial stenosis , medical therapy advised  . AV block     s/p PPM  . COPD (chronic obstructive pulmonary disease)   . Carotid artery aneurysm     Bilateral internal carotid artery aneurysms followed by Dr. Donnetta Hutching  . Shortness of breath     with ambulation  . Constipation   . Shingles 12/04/2012    right lateral anterior chest  . Radiation 10/7,10/9,01/03/2013    SBRT Left upper lobe lung  . Prostate cancer     s/p cryotherapy  . Lung cancer, primary, with metastasis from lung to other site dx'd 09/2011 & 11/2012  . Anemia    Past Surgical History  Procedure Laterality Date  . Cystoscopy    . Lung surgery  1957   "born w/bleb in [place]  . Insert / replace / remove pacemaker  1991    initial placement  . Insert / replace / remove pacemaker  ~ 2011    "changed out"  . Tonsillectomy and adenoidectomy  1943  . Appendectomy  2000's  . Cholecystectomy  2000's  . Cataract extraction w/ intraocular lens implant  ~ 2008    left eye  . Lithotripsy  ~ 2011    "twice"  . Cardiac catheterization    . Prostate cryoablation  ~ 2008  . Video bronchoscopy  10/20/2011    Procedure: VIDEO BRONCHOSCOPY;  Surgeon: Grace Isaac, MD;  Location: [facility];  Service: Thoracic;  Laterality: N/A;  . Needle core biopsy  12/06/2012    Lung/lul  . Esophagogastroduodenoscopy N/A 09/20/2013    Procedure: ESOPHAGOGASTRODUODENOSCOPY (EGD);  Surgeon: Winfield Cunas., MD;  Location: [facility] ENDOSCOPY;  Service: Endoscopy;  Laterality: N/A;  . Left heart catheterization with coronary angiogram N/A 07/22/2011    Procedure: LEFT HEART CATHETERIZATION WITH CORONARY ANGIOGRAM;  Surgeon: Burnell Blanks, MD;  Location: [facility] CATH LAB;  Service: Cardiovascular;  Laterality: N/A;     Current Outpatient Prescriptions  Medication Sig Dispense Refill  . albuterol (PROVENTIL HFA;VENTOLIN HFA) 108 (90 BASE) MCG/ACT inhaler Inhale  2 puffs into the lungs every 6 (six) hours as needed for wheezing or shortness of breath.    . escitalopram (LEXAPRO) 20 MG tablet Take 20 mg by mouth at bedtime.     Marland Kitchen losartan (COZAAR) 50 MG tablet Take 1 tablet by mouth daily.    . nitroGLYCERIN (NITROSTAT) 0.4 MG SL tablet Place 1 tablet (0.4 mg total) under the tongue every 5 (five) minutes as needed. For chest pain 30 tablet 3  . tamsulosin (FLOMAX) 0.4 MG CAPS capsule Take 0.4 mg by mouth daily.     Marland Kitchen warfarin (COUMADIN) 5 MG tablet Take as directed by the coumadin clinc    . zolpidem (AMBIEN) 5 MG tablet Take 5 mg by mouth at bedtime as needed for sleep.     No current facility-administered medications for this visit.    Allergies:   Iohexol and Penicillins    Social History:  The patient  reports that he has been smoking Cigarettes.  He has a 50 pack-year smoking history. His smokeless tobacco use includes Chew. He reports that he does not drink alcohol or use illicit drugs.   Family History:  The patient's family history includes Brain cancer (age of onset: 61) in his father; Breast cancer in his mother; Cancer in his mother; Emphysema in his sister. There is no history of Hypertension, Hyperlipidemia, Heart failure, Heart disease, or Colon cancer.    ROS:  Please see the history of present illness.   All other systems are reviewed and negative.    PHYSICAL EXAM: VS:  BP 134/78 mmHg  Pulse 90  Ht '5\' 11"'$  (1.803 m)  Wt 63.141 kg (139 lb 3.2 oz)  BMI 19.42 kg/m2  SpO2 98% , BMI Body mass index is 19.42 kg/(m^2). GEN: Well nourished, well developed, in no acute distress HEENT: normal Neck: no JVD, carotid bruits, or masses Cardiac: RRR; no murmurs, rubs, or gallops,no edema, diminished DP/PT pulses bilaterally Respiratory:  clear to auscultation bilaterally, normal work of breathing GI: soft, nontender, nondistended, + BS MS: no deformity or atrophy Skin: warm and dry, device pocket is well healed Neuro:  Strength and sensation are intact Psych: euthymic mood, full affect  EKG:  EKG is ordered today. The ekg ordered today shows sinus rhythm 66 bpm with pacs, LVH  Device interrogation is reviewed today in detail.  See PaceArt for details.   Recent Labs: 05/28/2014: ALT 81*; BUN 10; Creatinine, Ser 1.04; Hemoglobin 14.5; Platelets 339.0; Potassium 3.5; Sodium 140; TSH 2.26    Lipid Panel     Component Value Date/Time   CHOL 192 07/22/2011 0153   TRIG 76 07/22/2011 0153   HDL 57 07/22/2011 0153   CHOLHDL 3.4 07/22/2011 0153   VLDL 15 07/22/2011 0153   LDLCALC 120* 07/22/2011 0153     Wt Readings from Last 3 Encounters:  09/10/14 63.141 kg (139 lb 3.2 oz)  08/16/14 64.864 kg (143 lb)  05/28/14 68.094 kg (150 lb 1.9 oz)       Other studies Reviewed: Additional studies/ records that were reviewed today include: Dr Antonietta Barcelona prior notes, cath from 2013   ASSESSMENT AND PLAN:  1.  Chronic difficulty with chest pain and SOB Though his symptoms are somewhat worrisome, they are chronic and have previously been evaluated with cath in 2013.  I will obtain a lexiscan myoview at this time.  If low, risk, then medical management is advised  2. Claudication symptoms He has known CVD and heavy h/o tobacco diminished pulses on exam  I will have my nurse contact Dr Luther Parody office for further assessment  3. HTN Stable No change required today  4. Tobacco Doesn't smoke but chews Cessation is advised  5. Transient AV block V paces <1% Chronically elevated thresholds appear stable from 2011 gen change (Dr Diona Browner op notes reviewed)  6. Prior PTE Previously on xarelto Now on coumadin  Continue remote monitoring Return to see Truitt Merle to follow-up on above in 4 weeks I will see again in 1 year  Current medicines are reviewed at length with the patient today.   The patient does not have concerns regarding his medicines.  The following changes were made today:  none  Labs/ tests ordered today include:  Orders Placed This Encounter  Procedures  . Myocardial Perfusion Imaging  . Implantable device check  . EKG 12-Lead     Signed, Thompson Grayer, MD  09/11/2014 9:46 PM     [address] [place] [address] [phone] (office) [phone] (fax)

## 2014-09-17 ENCOUNTER — Other Ambulatory Visit: Payer: Self-pay | Admitting: Internal Medicine

## 2014-09-17 DIAGNOSIS — M79606 Pain in leg, unspecified: Secondary | ICD-10-CM

## 2014-09-17 DIAGNOSIS — I739 Peripheral vascular disease, unspecified: Secondary | ICD-10-CM

## 2014-09-26 ENCOUNTER — Encounter: Payer: Self-pay | Admitting: Cardiology

## 2014-09-26 ENCOUNTER — Encounter: Payer: Self-pay | Admitting: Internal Medicine

## 2014-09-27 ENCOUNTER — Encounter (HOSPITAL_COMMUNITY): Payer: Medicare Other

## 2014-10-04 ENCOUNTER — Other Ambulatory Visit: Payer: Self-pay | Admitting: Cardiothoracic Surgery

## 2014-10-04 ENCOUNTER — Ambulatory Visit
Admission: RE | Admit: 2014-10-04 | Discharge: 2014-10-04 | Disposition: A | Discharge status: Home / Self Care | Payer: Medicare Other | Source: Ambulatory Visit | Attending: Radiation Oncology | Admitting: Radiation Oncology

## 2014-10-04 DIAGNOSIS — I2699 Other pulmonary embolism without acute cor pulmonale: Secondary | ICD-10-CM

## 2014-10-18 ENCOUNTER — Ambulatory Visit: Payer: Medicare Other | Admitting: Radiation Oncology

## 2014-10-18 ENCOUNTER — Encounter: Payer: Self-pay | Admitting: Family

## 2014-10-23 ENCOUNTER — Ambulatory Visit: Payer: Medicare Other | Admitting: Family

## 2014-11-15 ENCOUNTER — Ambulatory Visit: Payer: Medicare Other | Admitting: Cardiothoracic Surgery

## 2014-11-15 ENCOUNTER — Other Ambulatory Visit: Payer: Medicare Other

## 2014-12-04 ENCOUNTER — Ambulatory Visit: Payer: Medicare Other | Admitting: Family

## 2014-12-10 ENCOUNTER — Telehealth: Payer: Self-pay | Admitting: Cardiology

## 2014-12-10 ENCOUNTER — Encounter: Payer: Medicare Other | Admitting: *Deleted

## 2014-12-10 NOTE — Telephone Encounter (Signed)
Spoke w/ pt and confirmed remote transmission w/ him. Attempted to help pt trouble shoot monitor but was unsuccessful. Pt agreed to appt on 12-21 at 9:00 AM and in the mean time he will try to get monitor working by calling tech services.

## 2014-12-12 ENCOUNTER — Encounter: Payer: Self-pay | Admitting: Cardiology

## 2015-01-16 ENCOUNTER — Other Ambulatory Visit: Payer: Self-pay | Admitting: *Deleted

## 2015-01-16 DIAGNOSIS — C3412 Malignant neoplasm of upper lobe, left bronchus or lung: Secondary | ICD-10-CM

## 2015-01-16 DIAGNOSIS — R918 Other nonspecific abnormal finding of lung field: Secondary | ICD-10-CM

## 2015-01-30 ENCOUNTER — Encounter: Payer: Self-pay | Admitting: *Deleted

## 2015-02-06 ENCOUNTER — Encounter: Payer: Medicare Other | Admitting: Cardiothoracic Surgery

## 2015-02-06 ENCOUNTER — Other Ambulatory Visit: Payer: Medicare Other

## 2015-02-07 ENCOUNTER — Ambulatory Visit
Admission: RE | Admit: 2015-02-07 | Discharge: 2015-02-07 | Disposition: A | Discharge status: Home / Self Care | Payer: Medicare Other | Source: Ambulatory Visit | Attending: Cardiothoracic Surgery | Admitting: Cardiothoracic Surgery

## 2015-02-07 DIAGNOSIS — R918 Other nonspecific abnormal finding of lung field: Secondary | ICD-10-CM

## 2015-02-07 DIAGNOSIS — C3412 Malignant neoplasm of upper lobe, left bronchus or lung: Secondary | ICD-10-CM

## 2015-02-13 ENCOUNTER — Encounter: Payer: Self-pay | Admitting: Cardiothoracic Surgery

## 2015-02-13 ENCOUNTER — Ambulatory Visit (INDEPENDENT_AMBULATORY_CARE_PROVIDER_SITE_OTHER): Payer: Medicare Other | Admitting: Cardiothoracic Surgery

## 2015-02-13 VITALS — BP 170/99 | HR 104 | Resp 20 | Ht 71.0 in | Wt 146.0 lb

## 2015-02-13 DIAGNOSIS — R918 Other nonspecific abnormal finding of lung field: Secondary | ICD-10-CM

## 2015-02-13 DIAGNOSIS — C3492 Malignant neoplasm of unspecified part of left bronchus or lung: Secondary | ICD-10-CM

## 2015-02-13 NOTE — Progress Notes (Signed)
[address]       [place], [address]             [phone]                         [name] [facility] Medical Record #[id number] Date of Birth: [birth year]  [name], * [name] Downing, MD  Chief Complaint:   PostOp Follow Up Visit 10/20/2011  DATE OF DISCHARGE:  OPERATIVE REPORT  Preop: Non small cell Lung cancer- left  Postop: same  SURGICAL PROCEDURE: Repeat left Mini thoracotomy, wedge resection, and  segmentectomy of the superior segment of the left lower lobe, and  placement of On-Q device.    pT1b, pNX, MX. INVASIVE MODERATELY DIFFERENTIATED ADENOCARCINOMA, SPANNING 2.2 CM IN GREATEST DIMENSION.   Wedge resection 10/20/2011  Stereotactic radio therapy to left upper lobe lesion in October 2014  Needle BX of left upper lobe lesion: [id number]  ALK negative, EGFR  positive Lung, needle/core biopsy(ies), LUL - POSITIVE FOR ADENOCARCINOMA.  History of Present Illness:     Patient returns to the office today in follow-up followup after surgical wedge resection of a T1 B. moderately differentiated adenocarcinoma the lung 2 cm in size located in the superior segment of the left lower lobe 10/20/2011. The patient previously had full left thoracotomy many years ago for "cyst".  He notes a 16 pound weight loss .  He is recently been evaluated by dermatology for a chronic rash including biopsy but with no firm treatment or diagnosis. In mid January he had a PET scan done.  An separate  enlarging lesion in the left upper lobe was biopsy proven adenocarcinoma and he underwent stereotactic radiotherapy in the fall of 2014  Patient returns today with a followup CT scan following surgical resection of a left lower lobe lung adenocarcinoma in July of 2013 and subsequent stereotactic radiotherapy for a second primary in the left upper lobe.  Since last seen the patient's wife has expired after a long illness with stage IV liver cancer     History  Smoking status  . Current Some Day Smoker -- 1.00 packs/day for 50 years  . Types: Cigarettes  . Last Attempt to Quit: 03/23/2004  Smokeless tobacco  . Current User  . Types: Chew       Allergies  Allergen Reactions  . Iohexol      Desc: PT STATED TODAY HE GETS SLIGHT SOB FROM IV CONTRAST. HE NEEDS FULL PREMEDS FROM NOW ON PER DR. MATTERN   . Penicillins Itching and Rash    "haven't taken any  in 40 years"    Current Outpatient Prescriptions  Medication Sig Dispense Refill  . albuterol (PROVENTIL HFA;VENTOLIN HFA) 108 (90 BASE) MCG/ACT inhaler Inhale 2 puffs into the lungs every 6 (six) hours as needed for wheezing or shortness of breath.    . escitalopram (LEXAPRO) 20 MG tablet Take 20 mg by mouth at bedtime.     Marland Kitchen losartan (COZAAR) 50 MG tablet Take 1 tablet by mouth daily.    . nitroGLYCERIN (NITROSTAT) 0.4 MG SL tablet Place 1 tablet (0.4 mg total) under the tongue every 5 (five) minutes as needed. For chest pain 30 tablet 3  . tamsulosin (FLOMAX) 0.4 MG CAPS capsule Take 0.4 mg by mouth daily.     Marland Kitchen warfarin (COUMADIN) 5 MG tablet Take as directed by the coumadin clinc    . zolpidem (AMBIEN)  5 MG tablet Take 5 mg by mouth at bedtime as needed for sleep.     No current facility-administered medications for this visit.       Physical Exam: BP 170/99 mmHg  Pulse 104  Resp 20  Ht _0  (1.803 m)  Wt 146 lb (66.225 kg)  BMI 20.37 kg/m2  SpO2 98% Patient in sinus rhythm on palpation today General appearance: alert and cooperative Neurologic: intact Heart: regular rate and rhythm, S1, S2 normal, no murmur, click, rub or gallop and normal apical impulse Lungs: clear to auscultation bilaterally and normal percussion bilaterally Abdomen: soft, non-tender; bowel sounds normal; no masses,  no organomegaly Extremities: extremities normal, atraumatic, no cyanosis or edema and Homans sign is negative, no sign of DVT Wounds:well healed.  No cervical or  supraclavicular or axillary adenopathy  a fine papular rash over his torso  Diagnostic Studies & Laboratory data:         Recent Radiology Findings: Ct Chest Wo Contrast  02/07/2015  CLINICAL DATA:  Left lung cancer 2013, status post surgery and radiation. Prostate cancer 2008. EXAM: CT CHEST WITHOUT CONTRAST TECHNIQUE: Multidetector CT imaging of the chest was performed following the standard protocol without IV contrast. COMPARISON:  08/16/2014 FINDINGS: Mediastinum/Nodes: Upper right paratracheal node 8 mm in short axis on image 20 series 3, stable by my measurement. No pathologic adenopathy is observed. Atherosclerotic aortic arch. Ascending thoracic aorta 4.3 cm diameter. The dual lead pacer noted. Lungs/Pleura: Severe emphysema. Architectural distortion and density at the left lung apex is increased from the prior exam, image 14 series 4 compared image 15 of series 4 of the prior exam. However, the increased density is disorganized, irregular, surrounds varies bulla, and accordingly is difficult to measure. A lot of this increase in density could be inflammatory. Right lower lobe 0.9 by 0.7 cm ground-glass opacity nodule, stable by my measurements from 08/16/2014, but slowly increasing from 08/18/2011. Superior segment right lower lobe sub solid nodule 1.3 by 1.7 cm on image 27 series 4, essentially stable from 08/16/2014 but increased from 08/18/2011. 0.6 by 0.4 cm super segment left lower lobe nodule on image 24 series 4, previously 0.4 by 0.4 cm on 08/16/2014 and perhaps 2 mm in diameter on 08/18/2011. Left lower lobe solid nodule 0.7 by 0.5 cm on image 35 series 4, stable by my measurements on 08/16/2014 and previously measuring 0.5 by 0.4 cm on 08/18/2011. Upper abdomen: Cholecystectomy. Hypodense lesions of both renal upper poles favoring cysts given the photopenia on PET-CT. Musculoskeletal: Thoracic spondylosis. IMPRESSION: 1. Increase in architectural distortion and primarily interstitial  density at the left lung apex, possibly from radiation therapy or inflammatory process, less likely to be locally recurrent malignancy. Merits observation. 2. Several ground-glass nodules and several small left-sided solid nodules are present in the lungs. These are stable from 08/16/2014 but for the most part have enlarged compared to 08/18/11. Multi focal low grade adenocarcinoma is certainly not excluded based on these observations. These have not been appreciably hypermetabolic on PET-CT. 3. Aneurysmal ascending aorta. Recommend annual imaging followup by CTA or MRA. This recommendation follows 2010 ACCF/AHA/AATS/ACR/ASA/SCA/SCAI/SIR/STS/SVM Guidelines for the Diagnosis and Management of Patients with Thoracic Aortic Disease. Circulation. 2010; 121: B379-K327 4. Severe emphysema. Electronically Signed   By: Van Clines M.D.   On: 02/07/2015 10:41    Ct Chest Wo Contrast  08/16/2014   CLINICAL DATA:  Left lower lobe lung cancer restaging  EXAM: CT CHEST WITHOUT CONTRAST  TECHNIQUE: Multidetector CT imaging of the  chest was performed following the standard protocol without IV contrast.  COMPARISON:  05/18/2014 CT abdomen/ pelvis, most recent chest CT 12/28/2013  FINDINGS: Mediastinum/Nodes: Right-sided pacer in place. Ascending aortic ectasia measuring 4.2 cm image 33 again noted.  Heart size is normal. High pretracheal lymph node measuring 0.7 cm stable. No new hilar or mediastinal lymphadenopathy. No pericardial effusion.  Lungs/Pleura: Emphysematous changes are noted. Nodular ground-glass opacity right lower lobe measuring 0.8 cm image 47 is stable. 3 mm left upper lobe pulmonary nodule image 26 is stable. Subpleural nodularity predominantly in the left upper and lower lobes is subjectively stable. Stable left lower lobe 0.5 cm nodule image 38. Left upper lobe/perihilar linear presumed radiation fibrosis/scarring is stable. No pleural effusion.  Upper abdomen: Bilateral renal cortical cysts are again  noted, partly visualized.  Musculoskeletal: No lytic or sclerotic osseous lesion. No acute osseous abnormality. No compression deformity.  IMPRESSION: No significant change in bilateral pulmonary nodules or presumed post treatment change without acute abnormality or evidence for intrathoracic recurrence or metastasis.   Electronically Signed   By: Conchita Paris M.D.   On: 08/16/2014 09:48   I have independently reviewed the above radiology studies  and reviewed the findings with the patient.     PET:04/02/2014 CLINICAL DATA: Subsequent treatment strategy for restaging of lung cancer.  EXAM: NUCLEAR MEDICINE PET SKULL BASE TO THIGH  TECHNIQUE: 7.8 mCi F-18 FDG was injected intravenously. Full-ring PET imaging was performed from the skull base to thigh after the radiotracer. CT data was obtained and used for attenuation correction and anatomic localization.  FASTING BLOOD GLUCOSE: Value: 85 mg/dl  COMPARISON: PET of 12/01/2012. Most recent chest CT 12/28/2013.  FINDINGS: NECK  Hypermetabolism which corresponds to a posterior left parotid nodule. This measures 6 mm in a S.U.V. max of 5.9 on image 30. On the prior PET of 12/01/2012, this measured similar in size and was hypermetabolic.  No hypermetabolic cervical nodes.  CHEST  Mild hypermetabolism corresponding to left apical pleural parenchymal thickening. A left lower lobe vague 6 mm nodule corresponds to low level, non malignant range hypermetabolism. This measures a S.U.V. max of 0.8 on image 41. This nodule was similar in size on the prior CT and has been present back to 12/01/2012.  Low right paratracheal node measures 9 mm and a S.U.V. max of 3.4 on image 76. This node measured similar in size back to 12/01/2012.  ABDOMEN/PELVIS  No areas of abnormal hypermetabolism.  SKELETON  Facet arthropathy involving the left upper cervical spine. Concurrent hypermetabolism. No suspicious osseous  abnormalities.  CT IMAGES PERFORMED FOR ATTENUATION CORRECTION  Cerebral atrophy. No cervical adenopathy. Presumed sebaceous cyst about the posterior right neck at 5.6 cm.  Mild cardiomegaly. Ascending aorta upper normal in size, 3.8 cm. Similar. Aortic and branch vessel atherosclerosis. Pacer. Centrilobular emphysema. Right lower lobe 7 mm ground-glass nodule on image 48, similar. 4 mm left lower lobe nodule on image 27, similar.  Normal adrenal glands. Bilateral renal cysts. Bilateral nephrolithiasis. 11 mm hyper attenuating left renal lesion on image 135 is unchanged back to 12/01/2012. Cholecystectomy. Right common iliac artery dilatation is similar at 1.6 cm. Mild prostatomegaly. Right hydrocele. Left thoracotomy changes.  IMPRESSION: 1. A right paratracheal node is mildly hypermetabolic, but similar in size back to 2014. Favored to be reactive. 2. Otherwise, no evidence of hypermetabolic recurrent or metastatic disease. 3. Left lower lobe nodule which demonstrates low level, non malignant range hypermetabolism. Given stability back to 2014, likely benign. 4. Incidental findings,  including borderline ascending aortic dilatation, probable left renal complex cyst, and right common iliac artery dilatation. 5. Subtle hypermetabolic nodule in the left parotid gland has been present back to 2014. Although this could represent a primary parotid neoplasm, stability suggests relative indolent behavior. This could be re-evaluated on follow-up or further characterized with contrast and neck CT.   Electronically Signed  By: Abigail Miyamoto M.D.  On: 04/02/2014 12:58 Ct Chest Wo Contrast  12/28/2013   CLINICAL DATA:  Patient with history of lung cancer in 2013. Recurrence in 2014. Cough, mild shortness of breath.  EXAM: CT CHEST WITHOUT CONTRAST  TECHNIQUE: Multidetector CT imaging of the chest was performed following the standard protocol without IV contrast.  COMPARISON:   Chest CT 06/16/2013  FINDINGS: Pacer apparatus within the right anterior hemi thorax. Normal heart size. No pericardial effusion. No mediastinal or hilar adenopathy. Stable prominent sub cm mediastinal lymph nodes. Calcified atherosclerotic plaque involving the thoracic aorta.  Central airways are patent. Extensive diffuse centrilobular emphysema. Stable 8 mm right lower lobe pulmonary nodule. Stable 9 mm right upper lobe ground-glass nodule (image 23; series 4). Stable 5 mm left lower lobe pulmonary nodule (image 31; series 4). Stable left upper lobe suture line.  Incidental imaging of the upper abdomen demonstrate postcholecystectomy changes. Bilateral renal cysts. Normal adrenal glands. No aggressive or acute appearing osseous lesions.  IMPRESSION: Stable CT of the chest.  No change in pulmonary nodules.  Emphysema   Electronically Signed   By: Lovey Newcomer M.D.   On: 12/28/2013 13:18  Ct Chest Wo Contrast  08/28/2013   CLINICAL DATA:  Followup lung cancer  EXAM: CT CHEST WITHOUT CONTRAST  TECHNIQUE: Multidetector CT imaging of the chest was performed following the standard protocol without IV contrast.  COMPARISON:  06/16/2013  FINDINGS: No pleural effusion identified. Moderate to advanced changes of centrilobular emphysema identified. Pulmonary nodule within the right lower lobe is stable measuring 8 mm, image 44/series 5. Pulmonary nodule within the left lower lobe is stable measuring 5 mm, image 36/series 5. Peripheral scar like densities within the left lower lobe appear stable, image 31/series 5. Stable appearance of left upper lobe posterior suture line.  The heart size appears normal. There is no pericardial effusion. No mediastinal or hilar adenopathy. Calcified atherosclerotic disease involves the thoracic aorta.  Incidental imaging through the upper abdomen is significant for bilateral renal cysts. The adrenal glands appear normal. Patient is status post cholecystectomy.  Review of the visualized bony  structures is negative for aggressive lytic or sclerotic bone lesion.  IMPRESSION: 1. Stable CT of the chest. 2. No change in bilateral pulmonary nodules. 3. Atherosclerotic disease. 4. Emphysema.   Electronically Signed   By: Kerby Moors M.D.   On: 08/28/2013 14:27   Ct Chest Wo Contrast  06/16/2013   CLINICAL DATA:  Lung cancer. Prior left upper lobe resection. Right lung cancer with radiation. History of prostate cancer.  EXAM: CT CHEST WITHOUT CONTRAST  TECHNIQUE: Multidetector CT imaging of the chest was performed following the standard protocol without IV contrast.  COMPARISON:  02/27/2013  FINDINGS: Severe emphysematous changes in the lungs. Postsurgical changes in the posterior superior left upper lobe. 12 mm nodular area in the left apex, stable. Biapical scarring. Right lower lobe nodule on image 42 measures 8 mm, stable. Subpleural densities noted in the left lateral hemithorax are unchanged. No pleural effusions. No new or enlarging pulmonary nodules. Small nodule in the left lower lobe on image 36 measures 5 mm, stable.  Pacer is noted in place with leads in the right atrium and right ventricle, stable. Heart is normal size. Ascending aortic borderline in diameter at 3.9 cm. Small scattered mediastinal lymph nodes, none pathologically enlarged or changed since prior study. Chest wall soft tissues are unremarkable.  Large subcutaneous low-density lesion is noted posteriorly in the right posterior shoulder measuring approximately 6.2 cm, stable since PET CT. This presumably represents a large sebaceous cyst.  IMPRESSION: Stable postoperative changes in the posterior superior left upper lobe. Stable bilateral pulmonary nodules. No new or enlarging pulmonary nodules.  Mild slight dilatation of the ascending thoracic aorta measuring 4.0 cm.   Electronically Signed   By: Rolm Baptise M.D.   On: 06/16/2013 13:11  Ct Angio Head W/cm &/or Wo Cm  11/13/2012   *RADIOLOGY REPORT*  Clinical Data:  Numbness  all over.  History of bilateral ICA pseudoaneurysms.  Headache.  THE PATIENT WAS PREMEDICATED WITH IV STEROIDS BECAUSE OF THE HISTORY OF CONTRAST ALLERGY.  THERE WERE NO ADVERSE EVENTS.  CT ANGIOGRAPHY HEAD AND NECK  Technique:  Multidetector CT imaging of the head and neck was performed using the standard protocol during bolus administration of intravenous contrast.  Multiplanar CT image reconstructions including MIPs were obtained to evaluate the vascular anatomy. Carotid stenosis measurements (when applicable) are obtained utilizing NASCET criteria, using the distal internal carotid diameter as the denominator.  Contrast: 25m OMNIPAQUE IOHEXOL 350 MG/ML SOLN  Comparison:  08/25/2011 CT angio neck.  10/03/2008 CT angio head.  CTA NECK  Findings:  Right chest cardiac pacemaker.  COPD.  Spiculated left upper lobe lesion is new from priors measuring 11 x 15 mm.  In the setting of chronic of tobacco abuse, concern raised for pulmonary neoplasm.  Previously identified and biopsied, spiculated lesion superior segment left lower lobe lesion, not well seen on today's examination.  Stable subcentimeter thyroid nodules.  Negative larynx.  Advanced spondylosis.  Three-vessel arch configuration without proximal stenosis.  Unremarkable right common carotid artery.  Mild nonstenotic posterior wall plaque.  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  No stenosis.  No significant left or right vertebral artery pathology.  Left vertebral dominant.  Unremarkable left common carotid artery.  Posterior wall plaque is non stenotic in the proximal left internal carotid artery. Rim calcified predominately thrombosed 7 x 7 x 8 mm more inferior cervical ICA aneurysm projecting medially is unchanged.  More superiorly, a  partially thrombosed and calcified skull base cervical ICA aneurysm measuring 8 x 10 x 11 mm is  also stable. Lateral thrombus extends cephalad to the level of the horizontal petrous/foramen lacerum level.  No ICA dissection.   Review of the MIP images confirms the above findings.  IMPRESSION:  Stable extracranial atherosclerotic change and stable bilateral cervical ICA pseudoaneurysms.  No evidence for enlargement, the dissection, or neck hemorrhage.  New spiculated left upper lobe 11 x 15 mm lesion; in the setting of COPD cannot exclude squamous cell carcinoma. As the patient has been premedicated, CT chest will be performed shortly with an additional 50 ml of contrast for expeditious  evaluation of the chest and mediastinum given his contrast allergy. Findings discussed with EDP.  CTA HEAD  Findings:  There is no evidence for acute infarction, intracranial hemorrhage, mass lesion, hydrocephalus, or extra-axial fluid.  Mild atrophy.  Mild white matter disease.  Post infusion, no abnormal enhancement.  Intact calvarium.  The  intracranial vasculature is dolichoectatic but patent.  No intracranial dissection or berry aneurysm is present.  The left vertebral is dominant.  There is no proximal vascular occlusion or significant flow reducing lesion. Similar appearance to priors.   Review of the MIP images confirms the above findings.  IMPRESSION: Unremarkable CTA head.   Original Report Authenticated By: Rolla Flatten, M.D.   Ct Angio Neck W/cm &/or Wo/cm  11/13/2012   *RADIOLOGY REPORT*  Clinical Data:  Numbness all over.  History of bilateral ICA pseudoaneurysms.  Headache.  THE PATIENT WAS PREMEDICATED WITH IV STEROIDS BECAUSE OF THE HISTORY OF CONTRAST ALLERGY.  THERE WERE NO ADVERSE EVENTS.  CT ANGIOGRAPHY HEAD AND NECK  Technique:  Multidetector CT imaging of the head and neck was performed using the standard protocol during bolus administration of intravenous contrast.  Multiplanar CT image reconstructions including MIPs were obtained to evaluate the vascular anatomy. Carotid stenosis measurements (when  applicable) are obtained utilizing NASCET criteria, using the distal internal carotid diameter as the denominator.  Contrast: 1m OMNIPAQUE IOHEXOL 350 MG/ML SOLN  Comparison:  08/25/2011 CT angio neck.  10/03/2008 CT angio head.  CTA NECK  Findings:  Right chest cardiac pacemaker.  COPD.  Spiculated left upper lobe lesion is new from priors measuring 11 x 15 mm.  In the setting of chronic of tobacco abuse, concern raised for pulmonary neoplasm.  Previously identified and biopsied, spiculated lesion superior segment left lower lobe lesion, not well seen on today's examination.  Stable subcentimeter thyroid nodules.  Negative larynx.  Advanced spondylosis.  Three-vessel arch configuration without proximal stenosis.  Unremarkable right common carotid artery.  Mild nonstenotic posterior wall plaque.  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  No stenosis.  No significant left or right vertebral artery pathology.  Left vertebral dominant.  Unremarkable left common carotid artery.  Posterior wall plaque is non stenotic in the proximal left internal carotid artery. Rim calcified predominately thrombosed 7 x 7 x 8 mm more inferior cervical ICA aneurysm projecting medially is unchanged.  More superiorly, a  partially thrombosed and calcified skull base cervical ICA aneurysm measuring 8 x 10 x 11 mm is also stable. Lateral thrombus extends cephalad to the level of the horizontal petrous/foramen lacerum level.  No ICA dissection.   Review of the MIP images confirms the above findings.  IMPRESSION:  Stable extracranial atherosclerotic change and stable bilateral cervical ICA pseudoaneurysms.  No evidence for enlargement, the dissection, or neck hemorrhage.  New spiculated left upper lobe 11 x 15 mm lesion; in the setting of COPD cannot exclude squamous cell carcinoma. As the patient has been premedicated,  CT chest will be performed shortly with an additional 50 ml of contrast for expeditious  evaluation of the chest and mediastinum given his contrast allergy. Findings discussed with EDP.  CTA HEAD  Findings:  There is no evidence for acute infarction, intracranial hemorrhage, mass lesion, hydrocephalus, or extra-axial fluid.  Mild atrophy.  Mild white matter disease.  Post infusion, no abnormal enhancement.  Intact calvarium.  The  intracranial vasculature is dolichoectatic but patent.  No intracranial dissection or berry aneurysm is present.  The left vertebral is dominant.  There is no proximal vascular occlusion or significant flow reducing lesion. Similar appearance to priors.   Review of the MIP images confirms the above findings.  IMPRESSION: Unremarkable CTA  head.   Original Report Authenticated By: Rolla Flatten, M.D.   Ct Chest W Contrast  11/22/2012   *RADIOLOGY REPORT*  Clinical Data: Shortness of breath, weakness, abnormality on recently performed neck CTA.  CT CHEST WITH CONTRAST  Technique:  Multidetector CT imaging of the chest was performed following the standard protocol during bolus administration of intravenous contrast.  Contrast: 40m OMNIPAQUE IOHEXOL 300 MG/ML  SOLN  Comparison: Neck CT head - earlier same day; chest CT - 07/14/2012; 09/03/2011  Findings:  Stable postsurgical change of the superior segment of the left lower lobe.  While the external diameter of the previous identified approximately 1.3 x 1.0 cm nodule within the left lung apex (image 13, series 3) is grossly unchanged, the nodule appears more consolidated on the present examination, with slight differences possibly attributable to slice selection.  Additional approximately 8 mm ground-glass nodule within the right lower lobe (image 43, series 3) appears grossly unchanged, as does an approximately 5 mm ground-glass nodule within the superior segment of the lingula (image 34, series 3).  No new discrete pulmonary nodules.   Scattered shoddy mediastinal lymph nodes appear grossly unchanged and are individually not enlarged by size criteria with index pretracheal node measuring 9 mm in short axis diameter (image 23, series 2). No definite hilar or axillary lymphadenopathy.  Advanced centrilobular emphysematous change.  Minimal dependent subsegmental atelectasis.  No focal airspace opacity.  No pleural effusion or pneumothorax.  The central pulmonary airways are patent.  Normal heart size.  Right anterior chest wall dual lead pacemaker. No pericardial effusion.  Normal caliber of the main pulmonary artery. Grossly unchanged mild ectasia of the ascending thoracic aorta measuring approximately 4.1 cm in greatest oblique short axis diameter (image 30, series 2).  Conventional configuration of the aortic arch.  Scattered minimal atherosclerotic plaque within the thoracic aorta.  No definite thoracic aortic dissection or periaortic stranding.  Limited evaluation of the upper abdomen demonstrates a partially exophytic approximately 3.0 cm cyst arising from the posterior superior aspect of the right kidney.  Several additional smaller renal cysts are noted bilaterally.  Post cholecystectomy.  No acute or aggressive osseous abnormalities.  IMPRESSION:  1.  No change to minimal increase in the indeterminate approximately 1.3 cm nodule within the left lung apex, with slight differences in possibly attributable to slice selection.  While possibly representing of scar, given advanced emphysematous change, a slowly growing lung cancer is not excluded.  As such, further evaluation with a follow-up chest CT in 3 months is recommended.  2.  Grossly unchanged bilateral sub centimeter ground-glass nodules, likely too small to accurately characterize.  Continued attention on follow-up is recommended.  3.  Stable postsurgical change of the superior segment of the left lower lobe.  4.  Grossly unchanged mild fusiform ectasia of the descending thoracic aorta  measuring approximately 41 mm in diameter.  Above findings discussed with Dr. AZenia Residesat the time of examination completion.   Original Report Authenticated By: JJake Seats MD   Ct Chest Wo Contrast  07/14/2012  *RADIOLOGY REPORT*  Clinical Data: Follow-up lung cancer.  CT CHEST WITHOUT CONTRAST  Technique:  Multidetector CT imaging of the chest was performed following the standard protocol without IV contrast.  Comparison: CT scan 08/18/2011.  Findings: The chest wall is stable.  A permanent right-sided pacemaker is noted.  No supraclavicular or axillary lymphadenopathy.  The bony thorax is intact.  No destructive bone lesions or spinal canal compromise.  Moderate degenerative changes involving the  spine.  The heart is normal in size.  No pericardial effusion.  No mediastinal or hilar lymphadenopathy.  Stable tortuosity and ectasia of the thoracic aorta.  The esophagus is grossly normal.  Examination of the lung parenchyma demonstrates stable advanced emphysematous changes and pulmonary interstitial scarring.  The left upper lobe pulmonary nodule has been excised.  No findings for recurrent or residual tumor.  There is a new left apical lung density.  This could reflect progressive scarring change but does need observation.  There is a 7 mm nodule at the right lung base.  This is difficult to see on the prior CT but was a on the prior PET CT and needs continued observation.  There is an ill-defined nodular density measuring approximate 5 mm in the superior segment of the left lower lobe on image number 31. I believe this was present on the prior CT scan although it is more obvious now.  A 4 mm nodule is noted on image number 22 in the left upper lobe. This was present on the prior study and has not significantly changed.  No acute pulmonary findings or pleural effusion.  The upper abdomen is unremarkable.  Stable right renal cyst.  IMPRESSION:  1.  Surgical changes from prior excision of a left apical lung  neoplasm. 2.  Advanced emphysematous changes and pulmonary scarring. 3.  New 13 x 8 mm left apical lung density.  This could reflect scarring change but needs close observation. 4.  Other small pulmonary nodules as discussed above.  Recommend continued observation. 5.  No acute pulmonary findings and no mediastinal or hilar lymphadenopathy. 6.  Stable tortuosity and ectasia of the thoracic aorta.   Original Report Authenticated By: Marijo Sanes, M.D.       Recent Labs: Lab Results  Component Value Date   WBC 9.7 05/28/2014   HGB 14.5 05/28/2014   HCT 43.4 05/28/2014   PLT 339.0 05/28/2014   GLUCOSE 133* 05/28/2014   CHOL 192 07/22/2011   TRIG 76 07/22/2011   HDL 57 07/22/2011   LDLCALC 120* 07/22/2011   ALT 81* 05/28/2014   AST 23 05/28/2014   NA 140 05/28/2014   K 3.5 05/28/2014   CL 106 05/28/2014   CREATININE 1.04 05/28/2014   BUN 10 05/28/2014   CO2 32 05/28/2014   TSH 2.26 05/28/2014   INR 1.30 09/19/2013   HGBA1C 5.0 06/13/2011      Assessment / Plan:     Wedge resection of Stage I INVASIVE MODERATELY DIFFERENTIATED ADENOCARCINOMA, SPANNING 2.2 CM IN GREATEST DIMENSION superior segment left lower lobe 2013 Ascending thoracic aorta 4.3 cm diameter The patient underwent stereotactic radiotherapy to the left upper lobe lesion. Completed 01/03/13  EGFR positive Several ground-glass nodules and several small left-sided solid nodules are present in the lungs. These are stable from 08/16/2014 but for the most part have enlarged compared to 08/18/11. Multi focal low grade adenocarcinoma is  not excluded  Will see in 6 months with follow up ct of the chest  Grace Isaac 02/13/2015 4:26 PM

## 2015-03-13 ENCOUNTER — Other Ambulatory Visit: Payer: Self-pay | Admitting: Physician Assistant

## 2015-03-13 ENCOUNTER — Encounter: Payer: Self-pay | Admitting: Internal Medicine

## 2015-03-13 ENCOUNTER — Ambulatory Visit (INDEPENDENT_AMBULATORY_CARE_PROVIDER_SITE_OTHER): Payer: Medicare Other | Admitting: *Deleted

## 2015-03-13 VITALS — BP 158/85 | HR 118

## 2015-03-13 DIAGNOSIS — Z950 Presence of cardiac pacemaker: Secondary | ICD-10-CM

## 2015-03-13 DIAGNOSIS — I442 Atrioventricular block, complete: Secondary | ICD-10-CM

## 2015-03-13 MED ORDER — METOPROLOL TARTRATE 25 MG PO TABS: 25.0000 mg | ORAL_TABLET | Freq: Two times a day (BID) | ORAL | Status: DC

## 2015-03-14 LAB — CUP PACEART INCLINIC DEVICE CHECK
Battery Remaining Longevity: 109.2
Brady Statistic RV Percent Paced: 0.03 %
Date Time Interrogation Session: 20161221133641
Implantable Lead Implant Date: 19910722
Implantable Lead Implant Date: 19910722
Implantable Lead Location: 753860
Lead Channel Pacing Threshold Amplitude: 1.25 V
Lead Channel Pacing Threshold Amplitude: 1.25 V
Lead Channel Sensing Intrinsic Amplitude: 10.3 mV
Lead Channel Setting Pacing Amplitude: 2.5 V
Lead Channel Setting Pacing Pulse Width: 1 ms
Lead Channel Setting Sensing Sensitivity: 2 mV
MDC IDC LEAD LOCATION: 753859
MDC IDC MSMT BATTERY VOLTAGE: 2.93 V
MDC IDC MSMT LEADCHNL RA IMPEDANCE VALUE: 450 Ohm
MDC IDC MSMT LEADCHNL RA PACING THRESHOLD PULSEWIDTH: 1 ms
MDC IDC MSMT LEADCHNL RA SENSING INTR AMPL: 4.1 mV
MDC IDC MSMT LEADCHNL RV IMPEDANCE VALUE: 375 Ohm
MDC IDC MSMT LEADCHNL RV PACING THRESHOLD PULSEWIDTH: 1 ms
MDC IDC PG SERIAL: 7167612
MDC IDC SET LEADCHNL RA PACING AMPLITUDE: 2.5 V
MDC IDC STAT BRADY RA PERCENT PACED: 0.71 %
Pulse Gen Model: 2210

## 2015-03-14 NOTE — Progress Notes (Addendum)
Pacemaker check in clinic. Normal device function. Thresholds, sensing, impedances consistent with previous measurements. Device programmed to maximize longevity. 174 AMS episodes (burden 3.9%), EGMs show 1:1 SVT, longest 4hr 43mn, peak A 213bpm, peak V 171bpm. 5 high ventricular rates noted, EGMs consistent with AT/1:1 SVT. Histogram distribution indicates 9.9% V rates >110bpm. B/P 158/85 today. Discussed with GT and K. TGrandville Silos PUtah who prescribed metoprolol tartrate '25mg'$  BID, patient to pick up today. Device programmed at appropriate safety margins; RA output reprogrammed from 2.0V to 2.5V, RV output reprogrammed from 3.0V to 2.5V. Device programmed to optimize intrinsic conduction. Estimated longevity 5.3-9.1 years. Patient education completed. ROV with JA on 04/29/15 at 3:45pm.

## 2015-04-09 ENCOUNTER — Emergency Department (HOSPITAL_COMMUNITY): Payer: Medicare Other

## 2015-04-09 ENCOUNTER — Encounter (HOSPITAL_COMMUNITY): Payer: Self-pay | Admitting: Emergency Medicine

## 2015-04-09 ENCOUNTER — Observation Stay (HOSPITAL_COMMUNITY)
Admission: EM | Admit: 2015-04-09 | Discharge: 2015-04-11 | Disposition: A | Discharge status: Home / Self Care | Payer: Medicare Other | Attending: Internal Medicine | Admitting: Internal Medicine

## 2015-04-09 ENCOUNTER — Encounter: Payer: Medicare Other | Admitting: Neurology

## 2015-04-09 DIAGNOSIS — Z85118 Personal history of other malignant neoplasm of bronchus and lung: Secondary | ICD-10-CM | POA: Insufficient documentation

## 2015-04-09 DIAGNOSIS — Z8546 Personal history of malignant neoplasm of prostate: Secondary | ICD-10-CM | POA: Diagnosis not present

## 2015-04-09 DIAGNOSIS — J449 Chronic obstructive pulmonary disease, unspecified: Secondary | ICD-10-CM | POA: Diagnosis not present

## 2015-04-09 DIAGNOSIS — K5790 Diverticulosis of intestine, part unspecified, without perforation or abscess without bleeding: Secondary | ICD-10-CM | POA: Insufficient documentation

## 2015-04-09 DIAGNOSIS — F17210 Nicotine dependence, cigarettes, uncomplicated: Secondary | ICD-10-CM | POA: Diagnosis not present

## 2015-04-09 DIAGNOSIS — D649 Anemia, unspecified: Secondary | ICD-10-CM | POA: Diagnosis not present

## 2015-04-09 DIAGNOSIS — M2550 Pain in unspecified joint: Secondary | ICD-10-CM

## 2015-04-09 DIAGNOSIS — E785 Hyperlipidemia, unspecified: Secondary | ICD-10-CM | POA: Diagnosis not present

## 2015-04-09 DIAGNOSIS — I2510 Atherosclerotic heart disease of native coronary artery without angina pectoris: Secondary | ICD-10-CM | POA: Diagnosis not present

## 2015-04-09 DIAGNOSIS — R2241 Localized swelling, mass and lump, right lower limb: Secondary | ICD-10-CM | POA: Diagnosis present

## 2015-04-09 DIAGNOSIS — Z8619 Personal history of other infectious and parasitic diseases: Secondary | ICD-10-CM | POA: Diagnosis not present

## 2015-04-09 DIAGNOSIS — N200 Calculus of kidney: Secondary | ICD-10-CM | POA: Diagnosis not present

## 2015-04-09 DIAGNOSIS — I720 Aneurysm of carotid artery: Secondary | ICD-10-CM | POA: Diagnosis not present

## 2015-04-09 DIAGNOSIS — K219 Gastro-esophageal reflux disease without esophagitis: Secondary | ICD-10-CM | POA: Diagnosis not present

## 2015-04-09 DIAGNOSIS — L03115 Cellulitis of right lower limb: Principal | ICD-10-CM | POA: Insufficient documentation

## 2015-04-09 DIAGNOSIS — Z950 Presence of cardiac pacemaker: Secondary | ICD-10-CM | POA: Diagnosis present

## 2015-04-09 DIAGNOSIS — Z880 Allergy status to penicillin: Secondary | ICD-10-CM | POA: Insufficient documentation

## 2015-04-09 DIAGNOSIS — K5900 Constipation, unspecified: Secondary | ICD-10-CM | POA: Insufficient documentation

## 2015-04-09 DIAGNOSIS — L0390 Cellulitis, unspecified: Secondary | ICD-10-CM | POA: Diagnosis present

## 2015-04-09 DIAGNOSIS — Z7901 Long term (current) use of anticoagulants: Secondary | ICD-10-CM

## 2015-04-09 DIAGNOSIS — Z79899 Other long term (current) drug therapy: Secondary | ICD-10-CM | POA: Insufficient documentation

## 2015-04-09 DIAGNOSIS — I10 Essential (primary) hypertension: Secondary | ICD-10-CM | POA: Diagnosis present

## 2015-04-09 DIAGNOSIS — H5462 Unqualified visual loss, left eye, normal vision right eye: Secondary | ICD-10-CM | POA: Diagnosis not present

## 2015-04-09 LAB — BASIC METABOLIC PANEL
Anion gap: 8 (ref 5–15)
BUN: 9 mg/dL (ref 6–20)
CHLORIDE: 104 mmol/L (ref 101–111)
CO2: 28 mmol/L (ref 22–32)
CREATININE: 0.89 mg/dL (ref 0.61–1.24)
Calcium: 11 mg/dL — ABNORMAL HIGH (ref 8.9–10.3)
GFR calc Af Amer: >60 mL/min (ref >60)
GFR calc non Af Amer: >60 mL/min (ref >60)
GLUCOSE: 124 mg/dL — AB (ref 65–99)
Potassium: 3.8 mmol/L (ref 3.5–5.1)
SODIUM: 140 mmol/L (ref 135–145)

## 2015-04-09 LAB — CBC WITH DIFFERENTIAL/PLATELET
BASOS PCT: 0 %
Basophils Absolute: 0 10*3/uL (ref 0.0–0.1)
EOS ABS: 0.1 10*3/uL (ref 0.0–0.7)
EOS PCT: 1 %
HCT: 48.5 % (ref 39.0–52.0)
Hemoglobin: 16.5 g/dL (ref 13.0–17.0)
LYMPHS ABS: 1.4 10*3/uL (ref 0.7–4.0)
Lymphocytes Relative: 16 %
MCH: 31.7 pg (ref 26.0–34.0)
MCHC: 34 g/dL (ref 30.0–36.0)
MCV: 93.3 fL (ref 78.0–100.0)
MONOS PCT: 15 %
Monocytes Absolute: 1.4 10*3/uL — ABNORMAL HIGH (ref 0.1–1.0)
NEUTROS PCT: 68 %
Neutro Abs: 6.3 10*3/uL (ref 1.7–7.7)
PLATELETS: 247 10*3/uL (ref 150–400)
RBC: 5.2 MIL/uL (ref 4.22–5.81)
RDW: 12.7 % (ref 11.5–15.5)
WBC: 9.3 10*3/uL (ref 4.0–10.5)

## 2015-04-09 LAB — PROTIME-INR
INR: 2.06 — ABNORMAL HIGH (ref 0.00–1.49)
PROTHROMBIN TIME: 23.1 s — AB (ref 11.6–15.2)

## 2015-04-09 LAB — C-REACTIVE PROTEIN: CRP: 5.5 mg/dL — AB (ref <1.0)

## 2015-04-09 LAB — SEDIMENTATION RATE: SED RATE: 15 mm/h (ref 0–16)

## 2015-04-09 LAB — I-STAT CG4 LACTIC ACID, ED: Lactic Acid, Venous: 1.69 mmol/L (ref 0.5–2.0)

## 2015-04-09 MED ORDER — OXYCODONE-ACETAMINOPHEN 5-325 MG PO TABS
1.0000 | ORAL_TABLET | Freq: Once | ORAL | Status: AC
  Administered 2015-04-09: 1 via ORAL

## 2015-04-09 MED ORDER — LOSARTAN POTASSIUM 50 MG PO TABS
50.0000 mg | ORAL_TABLET | Freq: Every day | ORAL | Status: DC
  Administered 2015-04-10 – 2015-04-11 (×2): 50 mg via ORAL
  Filled 2015-04-09 (×2): qty 1

## 2015-04-09 MED ORDER — ACETAMINOPHEN 650 MG RE SUPP: 650.0000 mg | Freq: Four times a day (QID) | RECTAL | Status: DC | PRN

## 2015-04-09 MED ORDER — OXYCODONE-ACETAMINOPHEN 5-325 MG PO TABS
1.0000 | ORAL_TABLET | Freq: Once | ORAL | Status: AC
Start: 2015-04-09 — End: 2015-04-09
  Administered 2015-04-09: 1 via ORAL
  Filled 2015-04-09: qty 1

## 2015-04-09 MED ORDER — ALBUTEROL SULFATE (2.5 MG/3ML) 0.083% IN NEBU: 3.0000 mL | INHALATION_SOLUTION | Freq: Four times a day (QID) | RESPIRATORY_TRACT | Status: DC | PRN

## 2015-04-09 MED ORDER — WARFARIN SODIUM 5 MG PO TABS: 5.0000 mg | ORAL_TABLET | Freq: Every day | ORAL | Status: DC

## 2015-04-09 MED ORDER — OXYCODONE HCL 5 MG PO TABS
5.0000 mg | ORAL_TABLET | ORAL | Status: DC | PRN
  Administered 2015-04-10 (×2): 5 mg via ORAL
  Filled 2015-04-09 (×2): qty 1

## 2015-04-09 MED ORDER — OXYCODONE-ACETAMINOPHEN 5-325 MG PO TABS
ORAL_TABLET | ORAL | Status: AC
  Administered 2015-04-09: 1 via ORAL
  Filled 2015-04-09: qty 1

## 2015-04-09 MED ORDER — PREDNISONE 20 MG PO TABS
20.0000 mg | ORAL_TABLET | Freq: Every day | ORAL | Status: DC
  Administered 2015-04-10 – 2015-04-11 (×2): 20 mg via ORAL
  Filled 2015-04-09 (×2): qty 1

## 2015-04-09 MED ORDER — TAMSULOSIN HCL 0.4 MG PO CAPS
0.4000 mg | ORAL_CAPSULE | Freq: Every day | ORAL | Status: DC
  Administered 2015-04-10 – 2015-04-11 (×2): 0.4 mg via ORAL
  Filled 2015-04-09 (×2): qty 1

## 2015-04-09 MED ORDER — ACETAMINOPHEN 325 MG PO TABS
650.0000 mg | ORAL_TABLET | Freq: Four times a day (QID) | ORAL | Status: DC | PRN
Start: 2015-04-09 — End: 2015-04-11

## 2015-04-09 MED ORDER — ZOLPIDEM TARTRATE 5 MG PO TABS: 5.0000 mg | ORAL_TABLET | Freq: Every evening | ORAL | Status: DC | PRN

## 2015-04-09 MED ORDER — WARFARIN - PHARMACIST DOSING INPATIENT: Freq: Every day | Status: DC

## 2015-04-09 MED ORDER — VANCOMYCIN HCL IN DEXTROSE 750-5 MG/150ML-% IV SOLN
750.0000 mg | Freq: Two times a day (BID) | INTRAVENOUS | Status: DC
  Administered 2015-04-10 – 2015-04-11 (×3): 750 mg via INTRAVENOUS
  Filled 2015-04-09 (×5): qty 150

## 2015-04-09 MED ORDER — ESCITALOPRAM OXALATE 20 MG PO TABS
20.0000 mg | ORAL_TABLET | Freq: Every day | ORAL | Status: DC
  Administered 2015-04-10 (×2): 20 mg via ORAL
  Filled 2015-04-09 (×2): qty 1

## 2015-04-09 MED ORDER — WARFARIN SODIUM 5 MG PO TABS
5.0000 mg | ORAL_TABLET | Freq: Once | ORAL | Status: AC
  Administered 2015-04-10: 5 mg via ORAL
  Filled 2015-04-09: qty 1

## 2015-04-09 MED ORDER — ONDANSETRON HCL 4 MG/2ML IJ SOLN
4.0000 mg | Freq: Four times a day (QID) | INTRAMUSCULAR | Status: DC | PRN
Start: 2015-04-09 — End: 2015-04-11

## 2015-04-09 MED ORDER — VANCOMYCIN HCL 10 G IV SOLR
1250.0000 mg | Freq: Once | INTRAVENOUS | Status: AC
  Administered 2015-04-09: 1250 mg via INTRAVENOUS
  Filled 2015-04-09: qty 1250

## 2015-04-09 MED ORDER — ONDANSETRON HCL 4 MG PO TABS: 4.0000 mg | ORAL_TABLET | Freq: Four times a day (QID) | ORAL | Status: DC | PRN

## 2015-04-09 MED ORDER — METOPROLOL TARTRATE 25 MG PO TABS
25.0000 mg | ORAL_TABLET | Freq: Two times a day (BID) | ORAL | Status: DC
  Administered 2015-04-10 – 2015-04-11 (×4): 25 mg via ORAL
  Filled 2015-04-09 (×4): qty 1

## 2015-04-09 NOTE — ED Notes (Signed)
Pts leg marked where redness ended per MD order

## 2015-04-09 NOTE — ED Notes (Signed)
Pt sent here by PCP for eval of redness and swelling to bilateral LE with right worse than left; pt sts hot to touch and red with swelling

## 2015-04-09 NOTE — H&P (Signed)
[facility] History and Physical  [name] PIR:[id number] DOB: 1932/10/25 DOA: 04/09/2015  PCP: [name], MD   Chief Complaint: Bilateral ankle pain and swelling, right greater than left  HPI: [name] is a 82 y.o. gentleman with multiple comorbidities including HTN, HLD, CAD, COPD, PE (chronic anticoagulation with warfarin), and PPM implant who was referred to the ED by his PCP for evaluation of bilateral ankle pain, redness, and swelling, most significant on the right.  Symptoms started two days ago and have been progressive.  No fevers or sweats but he reports mild chills.  No nausea or vomiting.  No trauma.  He is now having difficulty walking secondary to the pain.  He reports similar symptoms in the past but denies a known history of gout.  He reports that symptoms were self-limited in the past and denies specific treatments (antibiotics, anti-inflammatories).  Orthopedic surgery was called but not formally consulted in the ED.  Hospitalist asked to admit for observation.  Review of Systems: 12 systems reviewed and negative except as stated in HPI.  Past Medical History  Diagnosis Date  . Hyperlipidemia   . Hypertension   . Esophageal reflux   . Diverticulosis   . Rheumatic fever ~ 1944  . Vision loss of left eye     S/P "cataract OR"; "can see a little; not much"  . Kidney stones   . Hard of hearing, left   . Pulmonary embolism (HCC)     On Xarelto  . Hypercalcemia   . Arthritis     "knees"  . CAD (coronary artery disease)     nonobstructive by cath 07/22/11 with small to moderate sized diagonal branch with ostial stenosis , medical therapy advised  . AV block     s/p PPM  . COPD (chronic obstructive pulmonary disease) ([place])   . Carotid artery aneurysm (HCC)     Bilateral internal carotid artery aneurysms followed by Dr. Donnetta Hutching  . Shortness of breath     with ambulation  . Constipation   . Shingles 12/04/2012    right lateral anterior chest  .  Radiation 10/7,10/9,01/03/2013    SBRT Left upper lobe lung  . Prostate cancer ([place])     s/p cryotherapy  . Lung cancer, primary, with metastasis from lung to other site [facility]) dx'd 09/2011 & 11/2012  . Anemia    Past Surgical History  Procedure Laterality Date  . Cystoscopy    . Lung surgery  1957    "born w/bleb in [place]  . Insert / replace / remove pacemaker  1991    initial placement  . Insert / replace / remove pacemaker  ~ 2011    "changed out"  . Tonsillectomy and adenoidectomy  1943  . Appendectomy  2000's  . Cholecystectomy  2000's  . Cataract extraction w/ intraocular lens implant  ~ 2008    left eye  . Lithotripsy  ~ 2011    "twice"  . Cardiac catheterization    . Prostate cryoablation  ~ 2008  . Video bronchoscopy  10/20/2011    Procedure: VIDEO BRONCHOSCOPY;  Surgeon: Grace Isaac, MD;  Location: [facility];  Service: Thoracic;  Laterality: N/A;  . Needle core biopsy  12/06/2012    Lung/lul  . Esophagogastroduodenoscopy N/A 09/20/2013    Procedure: ESOPHAGOGASTRODUODENOSCOPY (EGD);  Surgeon: Winfield Cunas., MD;  Location: [facility] ENDOSCOPY;  Service: Endoscopy;  Laterality: N/A;  . Left heart catheterization with coronary angiogram N/A 07/22/2011  Procedure: LEFT HEART CATHETERIZATION WITH CORONARY ANGIOGRAM;  Surgeon: Burnell Blanks, MD;  Location[facility] CATH LAB;  Service: Cardiovascular;  Laterality: N/A;   Social History:  Social History   Social History Narrative   2 caffeine drink daily   He uses chewing tobacco.  He does not smoke cigarettes.  No EtOH or illicit drug use.  He is a widower.  He has a daughter, who is his next of kin.  Allergies  Allergen Reactions  . Iohexol      Desc: PT STATED TODAY HE GETS SLIGHT SOB FROM IV CONTRAST. HE NEEDS FULL PREMEDS FROM NOW ON PER DR. MATTERN   . Penicillins Itching and Rash    "haven't taken any  in 40 years"    Family History  Problem Relation Age of Onset  . Hypertension Neg Hx   . Hyperlipidemia Neg  Hx   . Heart failure Neg Hx   . Heart disease Neg Hx   . Colon cancer Neg Hx   . Breast cancer Mother   . Cancer Mother     Breast  . Emphysema Sister     was a smoker  . Brain cancer Father 13     type unknown   Prior to Admission medications   Medication Sig Start Date End Date Taking? Authorizing Provider  albuterol (PROVENTIL HFA;VENTOLIN HFA) 108 (90 BASE) MCG/ACT inhaler Inhale 2 puffs into the lungs every 6 (six) hours as needed for wheezing or shortness of breath.    Historical Provider, MD  escitalopram (LEXAPRO) 20 MG tablet Take 20 mg by mouth at bedtime. Reported on 03/13/2015 08/15/14   Historical Provider, MD  losartan (COZAAR) 50 MG tablet Take 1 tablet by mouth daily. Reported on 03/13/2015 09/04/13   Historical Provider, MD  metoprolol tartrate (LOPRESSOR) 25 MG tablet Take 1 tablet (25 mg total) by mouth 2 (two) times daily. 03/13/15   Eileen Stanford, PA-C  nitroGLYCERIN (NITROSTAT) 0.4 MG SL tablet Place 1 tablet (0.4 mg total) under the tongue every 5 (five) minutes as needed. For chest pain 09/10/14   Thompson Grayer, MD  tamsulosin (FLOMAX) 0.4 MG CAPS capsule Take 0.4 mg by mouth daily. Reported on 03/13/2015 08/14/14   Historical Provider, MD  warfarin (COUMADIN) 5 MG tablet Take as directed by the coumadin clinc 09/21/13   Debbe Odea, MD  zolpidem (AMBIEN) 5 MG tablet Take 5 mg by mouth at bedtime as needed for sleep. Reported on 03/13/2015    Historical Provider, MD   Physical Exam: Filed Vitals:   04/09/15 1151 04/09/15 1615 04/09/15 1825 04/09/15 1845  BP: 186/100 146/80 173/76 156/73  Pulse: 102 86 71 75  Temp: 98.1 F (36.7 C)     TempSrc: Oral     Resp: 18  16   SpO2: 100% 100% 99% 98%     General:  Awake and alert.  NAD.  Eyes: PERRL bilaterally  ENT: Moist mucous membranes  Neck: Supple  Cardiovascular: NR/RR, PPM palpated in upper right chest  Respiratory: CTA bilaterally.  Abdomen: Soft/NT/ND.  Bowel sounds are present.  No  guarding.  Skin: He has erythema at his right ankle that has been outlined and dated in the ED.  No drainage or ulcers.  No significant changes at the left ankle at this point.  Musculoskeletal: Moves all four extremities spontaneously but he has pain with any movement of the right ankle.  Swelling is mild at this point.  Psychiatric: Normal affect.  Neurologic: No focal deficits.  Labs on Admission:  Basic Metabolic Panel:  Recent Labs Lab 04/09/15 1204  NA 140  K 3.8  CL 104  CO2 28  GLUCOSE 124*  BUN 9  CREATININE 0.89  CALCIUM 11.0*   CBC:  Recent Labs Lab 04/09/15 1204  WBC 9.3  NEUTROABS 6.3  HGB 16.5  HCT 48.5  MCV 93.3  PLT 247   Normal lactic acid Normal sed rate CRP elevated to 5.5  Radiological Exams on Admission: Dg Ankle Complete Right  04/09/2015  CLINICAL DATA:  Right ankle pain and swelling, worse today. No known injury. Initial encounter. EXAM: RIGHT ANKLE - COMPLETE 3+ VIEW COMPARISON:  None. FINDINGS: Soft tissues about the ankle are swollen. No soft tissue gas collection or foreign body is identified. No acute or focal bony or joint abnormality is seen. Calcaneal spurring is identified. Atherosclerotic calcifications are seen. IMPRESSION: Soft tissue swelling without underlying acute bony or joint abnormality. Calcaneal spurring. Atherosclerosis. Electronically Signed   By: Inge Rise M.D.   On: 04/09/2015 17:04   Assessment/Plan Principal Problem:   Cellulitis of right ankle Active Problems:   Coronary atherosclerosis   PACEMAKER-[employer]   Joint pain   Chronic anticoagulation   Benign essential HTN   Cellulitis   1. Right ankle cellulitis, low index of suspicion for septic joint at this point but with complaints of bilateral ankle pain, GOUT is still high on the differential. --Will continue IV vancomycin as started in the ED.  I do not feel compelled to add gram negative coverage at this point. --He reportedly had a normal uric  acid level as an outpatient.  Will repeat in the AM. Empiric low dose prednisone, 20 mg daily, start now.  Avoiding NSAIDs since he is anti-coagulated. --Monitor for progression of erythema. --Can place formal ortho consult in AM if arthrocentesis is needed.  They were called in the ED but did not provide formal consultation at the time of admission. --Weight bearing as tolerated. PT consult if he is not discharged tomorrow.  2.  Chronic anticoagulation for history of PE --INR therapeutic, pharmacy to manage  3.  HTN --Continue home medications  Code Status: FULL Disposition Plan: Observation status for now  Time spent: 60 minutes  [employer] [facility]  04/09/2015, 7:46 PM

## 2015-04-09 NOTE — ED Provider Notes (Signed)
CSN: [id number]     Arrival date & time 04/09/15  1129 History   First MD Initiated Contact with Patient 04/09/15 1605     Chief Complaint  Patient presents with  . Leg Swelling  . Wound Check     (Consider location/radiation/quality/duration/timing/severity/associated sxs/prior Treatment) HPI Comments: 82 year old male with history of hyperlipidemia, hypertension, reflux, PE on Coumadin presents for swelling and redness of the skin of his right foot and ankle. The patient reports that this started yesterday. He saw his primary care physician who sent him for blood work which showed a normal uric acid. He said he followed up again with his primary care physician today because the redness and swelling had gotten significantly worse. He reports a similar episode to this mainly months ago that revealed resolved on its own. He reports severe pain even with putting a bed sheet over his foot. The patient denies fevers, chills, nausea, vomiting. He said he otherwise feels well. He and his daughter thinks that his left foot is starting to look similar to the right foot. His primary care physician sent him in for evaluation because they were unsure the exact etiology of the pain and swelling.  Patient is a 82 y.o. male presenting with wound check.  Wound Check Pertinent negatives include no chest pain, no abdominal pain, no headaches and no shortness of breath.    Past Medical History  Diagnosis Date  . Hyperlipidemia   . Hypertension   . Esophageal reflux   . Diverticulosis   . Rheumatic fever ~ 1944  . Vision loss of left eye     S/P "cataract OR"; "can see a little; not much"  . Kidney stones   . Hard of hearing, left   . Pulmonary embolism (HCC)     On Xarelto  . Hypercalcemia   . Arthritis     "knees"  . CAD (coronary artery disease)     nonobstructive by cath 07/22/11 with small to moderate sized diagonal branch with ostial stenosis , medical therapy advised  . AV block     s/p PPM   . COPD (chronic obstructive pulmonary disease) ([place])   . Carotid artery aneurysm (HCC)     Bilateral internal carotid artery aneurysms followed by Dr. Donnetta Hutching  . Shortness of breath     with ambulation  . Constipation   . Shingles 12/04/2012    right lateral anterior chest  . Radiation 10/7,10/9,01/03/2013    SBRT Left upper lobe lung  . Prostate cancer ([place])     s/p cryotherapy  . Lung cancer, primary, with metastasis from lung to other site [facility]) dx'd 09/2011 & 11/2012  . Anemia    Past Surgical History  Procedure Laterality Date  . Cystoscopy    . Lung surgery  1957    "born w/bleb in [place]  . Insert / replace / remove pacemaker  1991    initial placement  . Insert / replace / remove pacemaker  ~ 2011    "changed out"  . Tonsillectomy and adenoidectomy  1943  . Appendectomy  2000's  . Cholecystectomy  2000's  . Cataract extraction w/ intraocular lens implant  ~ 2008    left eye  . Lithotripsy  ~ 2011    "twice"  . Cardiac catheterization    . Prostate cryoablation  ~ 2008  . Video bronchoscopy  10/20/2011    Procedure: VIDEO BRONCHOSCOPY;  Surgeon: Grace Isaac, MD;  Location: [place];  Service: Thoracic;  Laterality: N/A;  .  Needle core biopsy  12/06/2012    Lung/lul  . Esophagogastroduodenoscopy N/A 09/20/2013    Procedure: ESOPHAGOGASTRODUODENOSCOPY (EGD);  Surgeon: Winfield Cunas., MD;  Location[facility] ENDOSCOPY;  Service: Endoscopy;  Laterality: N/A;  . Left heart catheterization with coronary angiogram N/A 07/22/2011    Procedure: LEFT HEART CATHETERIZATION WITH CORONARY ANGIOGRAM;  Surgeon: Burnell Blanks, MD;  Location: [facility] CATH LAB;  Service: Cardiovascular;  Laterality: N/A;   Family History  Problem Relation Age of Onset  . Hypertension Neg Hx   . Hyperlipidemia Neg Hx   . Heart failure Neg Hx   . Heart disease Neg Hx   . Colon cancer Neg Hx   . Breast cancer Mother   . Cancer Mother     Breast  . Emphysema Sister     was a smoker  . Brain cancer  Father 31     type unknown   Social History  Substance Use Topics  . Smoking status: Current Some Day Smoker -- 1.00 packs/day for 50 years    Types: Cigarettes    Last Attempt to Quit: 03/23/2004  . Smokeless tobacco: Current User    Types: Chew  . Alcohol Use: No    Review of Systems  Constitutional: Negative for fever, diaphoresis, appetite change and fatigue.  HENT: Negative for congestion, postnasal drip, rhinorrhea and sinus pressure.   Respiratory: Negative for cough, chest tightness and shortness of breath.   Cardiovascular: Positive for leg swelling. Negative for chest pain and palpitations.  Gastrointestinal: Negative for nausea, vomiting, abdominal pain and diarrhea.  Genitourinary: Negative for dysuria, urgency and hematuria.  Musculoskeletal: Positive for joint swelling (right ankle). Negative for myalgias, back pain, neck pain and neck stiffness.  Skin: Positive for color change (redness and warmth of the skin over the right ankle and foot).  Neurological: Negative for dizziness, weakness and headaches.  Hematological: Bruises/bleeds easily.      Allergies  Iohexol and Penicillins  Home Medications   Prior to Admission medications   Medication Sig Start Date End Date Taking? Authorizing Provider  albuterol (PROVENTIL HFA;VENTOLIN HFA) 108 (90 BASE) MCG/ACT inhaler Inhale 2 puffs into the lungs every 6 (six) hours as needed for wheezing or shortness of breath.   Yes Historical Provider, MD  escitalopram (LEXAPRO) 20 MG tablet Take 20 mg by mouth at bedtime. Reported on 03/13/2015 08/15/14  Yes Historical Provider, MD  losartan (COZAAR) 50 MG tablet Take 1 tablet by mouth daily. Reported on 03/13/2015 09/04/13  Yes Historical Provider, MD  metoprolol tartrate (LOPRESSOR) 25 MG tablet Take 1 tablet (25 mg total) by mouth 2 (two) times daily. 03/13/15  Yes Eileen Stanford, PA-C  nitroGLYCERIN (NITROSTAT) 0.4 MG SL tablet Place 1 tablet (0.4 mg total) under the tongue  every 5 (five) minutes as needed. For chest pain 09/10/14  Yes Thompson Grayer, MD  tamsulosin (FLOMAX) 0.4 MG CAPS capsule Take 0.4 mg by mouth daily. Reported on 03/13/2015 08/14/14  Yes Historical Provider, MD  warfarin (COUMADIN) 5 MG tablet Take 5-7.5 mg by mouth See admin instructions. Take as directed by the coumadin clinc. Take 1 tablet ('5mg'$ ) for 3 days, then on the 4th day takes 1.5 tablets (7.'5mg'$ ), then start over. (does not take 7.'5mg'$  on a specific day of the week) 09/21/13  Yes Debbe Odea, MD  zolpidem (AMBIEN) 5 MG tablet Take 5 mg by mouth at bedtime as needed for sleep. Reported on 03/13/2015   Yes Historical Provider, MD  acetaminophen (TYLENOL) 325 MG  tablet Take 2 tablets (650 mg total) by mouth every 6 (six) hours as needed for mild pain (or Fever >/= 101). 04/11/15   Kinnie Feil, MD  doxycycline (VIBRAMYCIN) 100 MG capsule Take 1 capsule (100 mg total) by mouth 2 (two) times daily. 04/11/15   Kinnie Feil, MD  predniSONE (DELTASONE) 20 MG tablet Take 1 tablet (20 mg total) by mouth daily with breakfast. 04/11/15   Kinnie Feil, MD   BP 106/61 mmHg  Pulse 92  Temp(Src) 98.3 F (36.8 C) (Oral)  Resp 18  Ht '5\' 11"'$  (1.803 m)  Wt 134 lb (60.782 kg)  BMI 18.70 kg/m2  SpO2 96% Physical Exam  Constitutional: He is oriented to person, place, and time. He appears well-developed and well-nourished. No distress.  HENT:  Head: Normocephalic and atraumatic.  Right Ear: External ear normal.  Left Ear: External ear normal.  Mouth/Throat: Oropharynx is clear and moist. No oropharyngeal exudate.  Eyes: EOM are normal. Pupils are equal, round, and reactive to light.  Neck: Normal range of motion. Neck supple.  Cardiovascular: Normal rate, regular rhythm and intact distal pulses.   Pulmonary/Chest: Effort normal. No respiratory distress. He has no wheezes. He has no rales.  Abdominal: Soft. He exhibits no distension. There is no tenderness.  Musculoskeletal: He exhibits edema (right  ankle and foot, 2+ pitting).       Right ankle: He exhibits decreased range of motion and swelling. He exhibits no laceration and normal pulse. Tenderness.  Patient with severely limited range of motion of the ankle secondary to pain. Able to range it minimally though on examination. Significant erythema surrounding the entire ankle joint and down over the dorsum of the foot.  Neurological: He is alert and oriented to person, place, and time.  Skin: Skin is warm and dry. No rash noted. He is not diaphoretic. There is erythema.  Vitals reviewed.   ED Course  Procedures (including critical care time) Labs Review Labs Reviewed  CBC WITH DIFFERENTIAL/PLATELET - Abnormal; Notable for the following:    Monocytes Absolute 1.4 (*)    All other components within normal limits  BASIC METABOLIC PANEL - Abnormal; Notable for the following:    Glucose, Bld 124 (*)    Calcium 11.0 (*)    All other components within normal limits  PROTIME-INR - Abnormal; Notable for the following:    Prothrombin Time 23.1 (*)    INR 2.06 (*)    All other components within normal limits  C-REACTIVE PROTEIN - Abnormal; Notable for the following:    CRP 5.5 (*)    All other components within normal limits   Imaging Review No results found. I have personally reviewed and evaluated these images and lab results as part of my medical decision-making.   EKG Interpretation None      MDM  Patient was seen and evaluated in stable condition.  Examination consistent with cellulitis. There was concern for possible septic arthritis. Patient with normal white blood cell count, mildly elevated CRP, normal ESR. Unable to complete arthrocentesis secondary to overlying cellulitis and concern for infected and noninfected joint space. Discussed with orthopedic surgeon on call who recommended antibiotics overnight in observation. He said that they could be reconsult it if still felt to be suspicion for septic arthritis. Discussed  with the hospitalist on-call who agreed with admission. Patient was admitted under her care. Patient and family aware of plan of care and agreement. Final diagnoses:  Cellulitis of right lower extremity  1. Cellulitis    Harvel Quale, MD 04/12/15 [phone]

## 2015-04-09 NOTE — Progress Notes (Signed)
ANTIBIOTIC CONSULT NOTE - INITIAL  Pharmacy Consult for Vancomycin Indication: cellulitis  Allergies  Allergen Reactions  . Iohexol      Desc: PT STATED TODAY HE GETS SLIGHT SOB FROM IV CONTRAST. HE NEEDS FULL PREMEDS FROM NOW ON PER DR. MATTERN   . Penicillins Itching and Rash    "haven't taken any  in 40 years"    Patient Measurements:  Total Body Weight: 66.2kg Ideal Body Weight: 75.3kg  Vital Signs: Temp: 98.1 F (36.7 C) (01/17 1151) Temp Source: Oral (01/17 1151) BP: 186/100 mmHg (01/17 1151) Pulse Rate: 102 (01/17 1151) Intake/Output from previous day:   Intake/Output from this shift:    Labs:  Recent Labs  04/09/15 1204  WBC 9.3  HGB 16.5  PLT 247  CREATININE 0.89   CrCl cannot be calculated (Unknown ideal weight.). No results for input(s): VANCOTROUGH, VANCOPEAK, VANCORANDOM, GENTTROUGH, GENTPEAK, GENTRANDOM, TOBRATROUGH, TOBRAPEAK, TOBRARND, AMIKACINPEAK, AMIKACINTROU, AMIKACIN in the last 72 hours.   Microbiology: No results found for this or any previous visit (from the past 720 hour(s)).  Medical History: Past Medical History  Diagnosis Date  . Hyperlipidemia   . Hypertension   . Esophageal reflux   . Diverticulosis   . Rheumatic fever ~ 1944  . Vision loss of left eye     S/P "cataract OR"; "can see a little; not much"  . Kidney stones   . Hard of hearing, left   . Pulmonary embolism (HCC)     On Xarelto  . Hypercalcemia   . Arthritis     "knees"  . CAD (coronary artery disease)     nonobstructive by cath 07/22/11 with small to moderate sized diagonal branch with ostial stenosis , medical therapy advised  . AV block     s/p PPM  . COPD (chronic obstructive pulmonary disease) ([place])   . Carotid artery aneurysm (HCC)     Bilateral internal carotid artery aneurysms followed by Dr. Donnetta Hutching  . Shortness of breath     with ambulation  . Constipation   . Shingles 12/04/2012    right lateral anterior chest  . Radiation 10/7,10/9,01/03/2013   SBRT Left upper lobe lung  . Prostate cancer ([place])     s/p cryotherapy  . Lung cancer, primary, with metastasis from lung to other site [facility]) dx'd 09/2011 & 11/2012  . Anemia     Medications:   (Not in a hospital admission) Scheduled:  Infusions:   Assessment: 82yo M sent by PCP for eval of redness and swelling to bilateral LE w/ right worse than left, pt sts hot to touch and red w/ swelling. Pharmacy consulted to dose Vanc for cellulitis. Afebrile, WBC 9.3, sCr 0.89, NCrCl ~67, no cx results.  1/17>>Vanc>>  Goal of Therapy:  Vancomycin trough level 10-15 mcg/ml  Plan:  Vancomycin '1250mg'$  IV once, then '750mg'$  IV q12h Measure antibiotic drug levels at steady state Follow up culture results, clinical course, abx tx  Georgiann Mohs, PharmD Student   I agree with the above assessment and plan.  Nena Jordan, PharmD, BCPS 04/09/15, 6:12 PM

## 2015-04-09 NOTE — ED Notes (Signed)
Admitting at bedside.  Okay'd patient to eat.  Will provide patient with [place] sandwich and Sprite, per request

## 2015-04-09 NOTE — ED Notes (Signed)
MD at bedside. 

## 2015-04-09 NOTE — Progress Notes (Signed)
ANTICOAGULATION CONSULT NOTE - Initial Consult  Pharmacy Consult for Warfarin  Indication: History of PE  Allergies  Allergen Reactions  . Iohexol      Desc: PT STATED TODAY HE GETS SLIGHT SOB FROM IV CONTRAST. HE NEEDS FULL PREMEDS FROM NOW ON PER DR. MATTERN   . Penicillins Itching and Rash    "haven't taken any  in 40 years" Has patient had a PCN reaction causing immediate rash, facial/tongue/throat swelling, SOB or lightheadedness with hypotension: NO Has patient had a PCN reaction causing severe rash involving mucus membranes or skin necrosis:NO Has patient had a PCN reaction that required hospitalization NO Has patient had a PCN reaction occurring within the last 10 years: NO If all of the above answers are "NO", then may proceed with Cephalosporin use.    Patient Measurements: Height: '5\' 11"'$  (180.3 cm) Weight: 137 lb 6.4 oz (62.324 kg) IBW/kg (Calculated) : 75.3 Vital Signs: Temp: 98.3 F (36.8 C) (01/17 2306) Temp Source: Oral (01/17 2306) BP: 174/71 mmHg (01/17 2306) Pulse Rate: 58 (01/17 2306)  Labs:  Recent Labs  04/09/15 1204  HGB 16.5  HCT 48.5  PLT 247  LABPROT 23.1*  INR 2.06*  CREATININE 0.89    Estimated Creatinine Clearance: 58.3 mL/min (by C-G formula based on Cr of 0.89).   Medical History: Past Medical History  Diagnosis Date  . Hyperlipidemia   . Hypertension   . Esophageal reflux   . Diverticulosis   . Rheumatic fever ~ 1944  . Vision loss of left eye     S/P "cataract OR"; "can see a little; not much"  . Kidney stones   . Hard of hearing, left   . Pulmonary embolism (HCC)     On Xarelto  . Hypercalcemia   . Arthritis     "knees"  . CAD (coronary artery disease)     nonobstructive by cath 07/22/11 with small to moderate sized diagonal branch with ostial stenosis , medical therapy advised  . AV block     s/p PPM  . COPD (chronic obstructive pulmonary disease) ([place])   . Carotid artery aneurysm (HCC)     Bilateral internal carotid  artery aneurysms followed by Dr. Donnetta Hutching  . Shortness of breath     with ambulation  . Constipation   . Shingles 12/04/2012    right lateral anterior chest  . Radiation 10/7,10/9,01/03/2013    SBRT Left upper lobe lung  . Prostate cancer ([place])     s/p cryotherapy  . Lung cancer, primary, with metastasis from lung to other site [facility]) dx'd 09/2011 & 11/2012  . Anemia     Assessment: 82 y/o M here with bilateral ankle pain/swelling, continuing warfarin PTA for history of PE, INR is 2.06 on admit, last dose 1/16, CBC good, renal function good, other labs reviewed.   Goal of Therapy:  INR 2-3 Monitor platelets by anticoagulation protocol: Yes   Plan:  -Warfarin 5 mg PO x 1 now -Daily PT/INR -Monitor for bleeding  Narda Bonds 04/09/2015,11:45 PM

## 2015-04-10 ENCOUNTER — Encounter (HOSPITAL_COMMUNITY): Payer: Self-pay | Admitting: *Deleted

## 2015-04-10 DIAGNOSIS — I10 Essential (primary) hypertension: Secondary | ICD-10-CM | POA: Diagnosis not present

## 2015-04-10 DIAGNOSIS — Z7901 Long term (current) use of anticoagulants: Secondary | ICD-10-CM | POA: Diagnosis not present

## 2015-04-10 DIAGNOSIS — M2550 Pain in unspecified joint: Secondary | ICD-10-CM | POA: Diagnosis not present

## 2015-04-10 DIAGNOSIS — L03115 Cellulitis of right lower limb: Secondary | ICD-10-CM | POA: Diagnosis not present

## 2015-04-10 DIAGNOSIS — M10071 Idiopathic gout, right ankle and foot: Secondary | ICD-10-CM

## 2015-04-10 LAB — BASIC METABOLIC PANEL
Anion gap: 7 (ref 5–15)
BUN: 10 mg/dL (ref 6–20)
CALCIUM: 10.4 mg/dL — AB (ref 8.9–10.3)
CO2: 26 mmol/L (ref 22–32)
CREATININE: 0.88 mg/dL (ref 0.61–1.24)
Chloride: 106 mmol/L (ref 101–111)
GFR calc Af Amer: >60 mL/min (ref >60)
GFR calc non Af Amer: >60 mL/min (ref >60)
GLUCOSE: 86 mg/dL (ref 65–99)
Potassium: 3.6 mmol/L (ref 3.5–5.1)
Sodium: 139 mmol/L (ref 135–145)

## 2015-04-10 LAB — CBC
HEMATOCRIT: 44.1 % (ref 39.0–52.0)
Hemoglobin: 15 g/dL (ref 13.0–17.0)
MCH: 31.9 pg (ref 26.0–34.0)
MCHC: 34 g/dL (ref 30.0–36.0)
MCV: 93.8 fL (ref 78.0–100.0)
Platelets: 211 10*3/uL (ref 150–400)
RBC: 4.7 MIL/uL (ref 4.22–5.81)
RDW: 12.7 % (ref 11.5–15.5)
WBC: 7.8 10*3/uL (ref 4.0–10.5)

## 2015-04-10 LAB — URIC ACID: URIC ACID, SERUM: 5.2 mg/dL (ref 4.4–7.6)

## 2015-04-10 LAB — PROTIME-INR
INR: 1.83 — ABNORMAL HIGH (ref 0.00–1.49)
Prothrombin Time: 21.1 seconds — ABNORMAL HIGH (ref 11.6–15.2)

## 2015-04-10 MED ORDER — WARFARIN SODIUM 7.5 MG PO TABS
7.5000 mg | ORAL_TABLET | Freq: Once | ORAL | Status: AC
  Administered 2015-04-10: 7.5 mg via ORAL
  Filled 2015-04-10: qty 1

## 2015-04-10 MED ORDER — GUAIFENESIN-DM 100-10 MG/5ML PO SYRP
5.0000 mL | ORAL_SOLUTION | ORAL | Status: DC | PRN
  Administered 2015-04-10: 5 mL via ORAL
  Filled 2015-04-10: qty 5

## 2015-04-10 MED ORDER — COUMADIN BOOK
Freq: Once | Status: AC
  Administered 2015-04-10: 18:00:00
  Filled 2015-04-10: qty 1

## 2015-04-10 NOTE — Evaluation (Signed)
Physical Therapy Evaluation Patient Details Name: [name] MRN: [id number] DOB: 1932-11-25 Today's Date: 04/10/2015   History of Present Illness  [name] is a 82 y.o. gentleman with multiple comorbidities including HTN, HLD, CAD, COPD, PE (chronic anticoagulation with warfarin), and PPM implant who was referred to the ED by his PCP for evaluation of bilateral ankle pain, redness, and swelling, most significant on the right. Symptoms started two days ago and have been progressive. No fevers or sweats but he reports mild chills. No nausea or vomiting. No trauma. He is now having difficulty walking secondary to the pain. He reports similar symptoms in the past but denies a known history of gout. He reports that symptoms were self-limited in the past and denies specific treatments (antibiotics, anti-inflammatories). Orthopedic surgery was called but not formally consulted in the ED.  Clinical Impression  Very pleasant male with above history eager to work with therapy this morning to return home. Pt limited due to pain in R ankle. PTA pt was independent and living alone. Today pt displays limitations in ambulation and transfers due to pain, requring Min-Mod A, despite VCs pt struggles to off load weight with BUEs on RW. Pt will have assistance from daughter at home and does not have any concerns about returning home. Recommending supervision with mobility once medically cleared which daughter will be able to give.    Follow Up Recommendations Supervision for mobility/OOB;No PT follow up;Supervision/Assistance - 24 hour (inital 24/7 supervision for 1-3 days then for mobility)    Equipment Recommendations  None recommended by PT    Recommendations for Other Services       Precautions / Restrictions Precautions Precautions: Fall Restrictions Weight Bearing Restrictions: Yes RLE Weight Bearing: Weight bearing as tolerated      Mobility  Bed Mobility Overal bed mobility:  Modified Independent             General bed mobility comments: Able to achieve sitting EOB with HOB elevated in one attempt w/o extra time  Transfers Overall transfer level: Needs assistance Equipment used: Rolling walker (2 wheeled) Transfers: Sit to/from Stand Sit to Stand: Min assist         General transfer comment: Frequent VC's required for hand palcement and safe return to sitting once standing. Prefers to pull up on walker despite corrections  Ambulation/Gait Ambulation/Gait assistance: Mod assist Ambulation Distance (Feet): 25 Feet Assistive device: Rolling walker (2 wheeled) Gait Pattern/deviations: Step-to pattern;Decreased stride length;Decreased stance time - right;Decreased step length - left;Decreased step length - right;Decreased weight shift to right;Shuffle;Antalgic Gait velocity: slow Gait velocity interpretation: Below normal speed for age/gender General Gait Details: Hesitant to place weigh ton R LE, fatigues quickly due to pain, max verbal cues utilized to assit off loading R LE with B UE support   Stairs            Wheelchair Mobility    Modified Rankin (Stroke Patients Only)       Balance Overall balance assessment: Needs assistance Sitting-balance support: No upper extremity supported;Feet unsupported Sitting balance-Leahy Scale: Good     Standing balance support: Bilateral upper extremity supported Standing balance-Leahy Scale: Poor Standing balance comment: heavy reliance on RW                             Pertinent Vitals/Pain Pain Assessment: 0-10 Pain Score: 4  Pain Location: R ankle Pain Descriptors / Indicators: Burning Pain Intervention(s): Monitored during session;Limited activity  within patient's tolerance    Home Living Family/patient expects to be discharged to:: Private residence Living Arrangements: Alone Available Help at Discharge: Family;Friend(s) Type of Home: House Home Access: Stairs to  enter Entrance Stairs-Rails: Left Entrance Stairs-Number of Steps: 3 Home Layout: One level Home Equipment: [employer] - 2 wheels;Cane - single point Additional Comments: pt reports daughter can come for a week    Prior Function Level of Independence: Independent         Comments: Used Walker/Cane if needed for pain management. Otherwise healthy and active in the community/hobbies     Hand Dominance   Dominant Hand: Right    Extremity/Trunk Assessment   Upper Extremity Assessment: Overall WFL for tasks assessed           Lower Extremity Assessment: Generalized weakness;RLE deficits/detail RLE Deficits / Details: Ankle pain limited equal weight distribution    Cervical / Trunk Assessment: Normal  Communication   Communication: HOH  Cognition Arousal/Alertness: Awake/alert Behavior During Therapy: WFL for tasks assessed/performed Overall Cognitive Status: Within Functional Limits for tasks assessed                      General Comments General comments (skin integrity, edema, etc.): RLE mildly swollen, irritated and red    Exercises Other Exercises Other Exercises: reapeated Sit to Stand x 4      Assessment/Plan    PT Assessment Patient needs continued PT services  PT Diagnosis Difficulty walking;Abnormality of gait;Acute pain   PT Problem List Decreased strength;Decreased activity tolerance;Decreased balance;Decreased coordination;Decreased mobility  PT Treatment Interventions Gait training;Stair training;Functional mobility training;Therapeutic activities;Balance training;DME instruction   PT Goals (Current goals can be found in the Care Plan section) Acute Rehab PT Goals Patient Stated Goal: go home with daughter today PT Goal Formulation: With patient Time For Goal Achievement: 04/24/15 Potential to Achieve Goals: Good    Frequency Min 3X/week   Barriers to discharge Decreased caregiver support pt stated daughter can come for at least a week  until he is feeling better    Co-evaluation               End of Session Equipment Utilized During Treatment: Gait belt Activity Tolerance: Patient limited by pain;Patient limited by fatigue Patient left: in chair;with call bell/phone within reach;with chair alarm set Nurse Communication: Mobility status    Functional Assessment Tool Used: clinical judgment Functional Limitation: Mobility: Walking and moving around Mobility: Walking and Moving Around Current Status (J8250): At least 20 percent but less than 40 percent impaired, limited or restricted Mobility: Walking and Moving Around Goal Status [phone]): At least 1 percent but less than 20 percent impaired, limited or restricted    Time: 0914-0937 PT Time Calculation (min) (ACUTE ONLY): 23 min   Charges:   PT Evaluation $PT Eval Moderate Complexity: 1 Procedure PT Treatments $Gait Training: 8-22 mins   PT G Codes:   PT G-Codes **NOT FOR INPATIENT CLASS** Functional Assessment Tool Used: clinical judgment Functional Limitation: Mobility: Walking and moving around Mobility: Walking and Moving Around Current Status (B3419): At least 20 percent but less than 40 percent impaired, limited or restricted Mobility: Walking and Moving Around Goal Status [phone]): At least 1 percent but less than 20 percent impaired, limited or restricted    Ara Kussmaul 04/10/2015, 11:53 AM

## 2015-04-10 NOTE — Progress Notes (Signed)
[facility] PROGRESS NOTE  [name] NAT:[id number] DOB: [birth year] DOA: 04/09/2015 PCP: [name], MD  Assessment/Plan: 82 y.o. Male with PMH of HTN, HLD, COPD, PE (chronic anticoagulation with warfarin), and PPM implant who was referred to the ED by his PCP for evaluation of bilateral ankle pain, redness, and swelling, most significant on the right -admitted with r foot cellulitis and possible gout   1. R ankle cellulitis. Clinically improving on IV atx. Cont to monitor 24-48 hrs   2. Possible gout BL ankle R>L ? Triggered by alcohol use. X ray; No acute fractures.  -Pt is improving on steroids. Cont pain control  3. Chronic anticoagulation for history of PE. Cont coumadin pharmacy to manage 4. HTN-Continue home medications   Code Status: full Family Communication: d/w patient, his daughter  (indicate person spoken with, relationship, and if by phone, the number) Disposition Plan: home 24-48 hrs    Consultants:  none  Procedures:  none  Antibiotics:  Vancomycin 1/17>>> (indicate start date, and stop date if known)  HPI/Subjective: Alert, no dustress   Objective: Filed Vitals:   04/10/15 0443 04/10/15 0947  BP: 165/62 150/84  Pulse: 62 102  Temp: 98.4 F (36.9 C) 98.2 F (36.8 C)  Resp: 19 18    Intake/Output Summary (Last 24 hours) at 04/10/15 1222 Last data filed at 04/10/15 0900  Gross per 24 hour  Intake    240 ml  Output    750 ml  Net   -510 ml   Filed Weights   04/09/15 2306  Weight: 62.324 kg (137 lb 6.4 oz)    Exam:   General:  Alert   Cardiovascular: s1,s2 rrr  Respiratory: CAT BL   Abdomen: soft, nt,n nd   Musculoskeletal: mild R ankle edema, erythema is improving    Data Reviewed: Basic Metabolic Panel:  Recent Labs Lab 04/09/15 1204 04/10/15 0558  NA 140 139  K 3.8 3.6  CL 104 106  CO2 28 26  GLUCOSE 124* 86  BUN 9 10  CREATININE 0.89 0.88  CALCIUM 11.0* 10.4*   Liver Function Tests: No  results for input(s): AST, ALT, ALKPHOS, BILITOT, PROT, ALBUMIN in the last 168 hours. No results for input(s): LIPASE, AMYLASE in the last 168 hours. No results for input(s): AMMONIA in the last 168 hours. CBC:  Recent Labs Lab 04/09/15 1204 04/10/15 0558  WBC 9.3 7.8  NEUTROABS 6.3  --   HGB 16.5 15.0  HCT 48.5 44.1  MCV 93.3 93.8  PLT 247 211   Cardiac Enzymes: No results for input(s): CKTOTAL, CKMB, CKMBINDEX, TROPONINI in the last 168 hours. BNP (last 3 results) No results for input(s): BNP in the last 8760 hours.  ProBNP (last 3 results) No results for input(s): PROBNP in the last 8760 hours.  CBG: No results for input(s): GLUCAP in the last 168 hours.  No results found for this or any previous visit (from the past 240 hour(s)).   Studies: Dg Ankle Complete Right  04/09/2015  CLINICAL DATA:  Right ankle pain and swelling, worse today. No known injury. Initial encounter. EXAM: RIGHT ANKLE - COMPLETE 3+ VIEW COMPARISON:  None. FINDINGS: Soft tissues about the ankle are swollen. No soft tissue gas collection or foreign body is identified. No acute or focal bony or joint abnormality is seen. Calcaneal spurring is identified. Atherosclerotic calcifications are seen. IMPRESSION: Soft tissue swelling without underlying acute bony or joint abnormality. Calcaneal spurring. Atherosclerosis. Electronically Signed   By: Inge Rise M.D.  On: 04/09/2015 17:04    Scheduled Meds: . coumadin book   Does not apply Once  . escitalopram  20 mg Oral QHS  . losartan  50 mg Oral Daily  . metoprolol  25 mg Oral BID  . predniSONE  20 mg Oral Q breakfast  . tamsulosin  0.4 mg Oral Daily  . vancomycin  750 mg Intravenous Q12H  . warfarin  7.5 mg Oral ONCE-1800  . Warfarin - Pharmacist Dosing Inpatient   Does not apply q1800   Continuous Infusions:   Principal Problem:   Cellulitis of right ankle Active Problems:   Coronary atherosclerosis   PACEMAKER-St.Jude   Joint pain    Chronic anticoagulation   Benign essential HTN   Cellulitis    Time spent: >35 minutes     Kinnie Feil  [facility] Pager [phone]. If 7PM-7AM, please contact night-coverage at [email or web address], password [facility] 04/10/2015, 12:22 PM

## 2015-04-10 NOTE — Progress Notes (Signed)
[place] for Warfarin  Indication: History of PE  Allergies  Allergen Reactions  . Iohexol      Desc: PT STATED TODAY HE GETS SLIGHT SOB FROM IV CONTRAST. HE NEEDS FULL PREMEDS FROM NOW ON PER DR. MATTERN   . Penicillins Itching and Rash    "haven't taken any  in 40 years" Has patient had a PCN reaction causing immediate rash, facial/tongue/throat swelling, SOB or lightheadedness with hypotension: NO Has patient had a PCN reaction causing severe rash involving mucus membranes or skin necrosis:NO Has patient had a PCN reaction that required hospitalization NO Has patient had a PCN reaction occurring within the last 10 years: NO If all of the above answers are "NO", then may proceed with Cephalosporin use.    Patient Measurements: Height: '5\' 11"'$  (180.3 cm) Weight: 137 lb 6.4 oz (62.324 kg) IBW/kg (Calculated) : 75.3 Vital Signs: Temp: 98.2 F (36.8 C) (01/18 0947) Temp Source: Oral (01/18 0947) BP: 150/84 mmHg (01/18 0947) Pulse Rate: 102 (01/18 0947)  Labs:  Recent Labs  04/09/15 1204 04/10/15 0558  HGB 16.5 15.0  HCT 48.5 44.1  PLT 247 211  LABPROT 23.1* 21.1*  INR 2.06* 1.83*  CREATININE 0.89 0.88    Estimated Creatinine Clearance: 59 mL/min (by C-G formula based on Cr of 0.88).  Assessment: 82 y/o M here with bilateral ankle pain/swelling, continuing warfarin PTA for history of PE.  INR is 2.06 on admit, dropped this morning to 1.83. Hgb 15, plts 211- no bleeding noted.  Confirmed home dose with patient/wife and [facility] this morning- takes '5mg'$  for 3 days, then takes 7.'5mg'$  for 1 day, then repeat.   Goal of Therapy:  INR 2-3 Monitor platelets by anticoagulation protocol: Yes   Plan:  -Warfarin 7.5 mg PO x 1 tonight  -Daily PT/INR -Monitor for bleeding -per patient and wife request, sending warfarin book with information on vitamin K containing foods  Riot Barrick D. Sinaya Minogue, PharmD, BCPS Clinical  Pharmacist Pager: [phone] 04/10/2015 10:57 AM

## 2015-04-10 NOTE — Progress Notes (Signed)
New Admission Note:   Arrival Method: stretcher Mental Orientation: alert and oriented x 4 Telemetry:no Assessment: Completed Skin:intact   See   Right  Leg     Area  marked IV:nsl Pain: see  mar Tubes:none Safety Measures: Safety Fall Prevention Plan has been given, and discussed Admission: Completed [facility] Orientation: Patient has been orientated to the room, unit and staff.  Family:  Orders have been reviewed and implemented. Will continue to monitor the patient. Call light has been placed within reach and bed alarm has been activated.   Amado Coe, RN Phone number: [phone]

## 2015-04-11 DIAGNOSIS — I10 Essential (primary) hypertension: Secondary | ICD-10-CM | POA: Diagnosis not present

## 2015-04-11 DIAGNOSIS — L03115 Cellulitis of right lower limb: Secondary | ICD-10-CM | POA: Diagnosis not present

## 2015-04-11 DIAGNOSIS — M2550 Pain in unspecified joint: Secondary | ICD-10-CM

## 2015-04-11 DIAGNOSIS — Z7901 Long term (current) use of anticoagulants: Secondary | ICD-10-CM | POA: Diagnosis not present

## 2015-04-11 LAB — PROTIME-INR
INR: 1.87 — AB (ref 0.00–1.49)
PROTHROMBIN TIME: 21.5 s — AB (ref 11.6–15.2)

## 2015-04-11 MED ORDER — PREDNISONE 20 MG PO TABS: 20.0000 mg | ORAL_TABLET | Freq: Every day | ORAL | Status: DC

## 2015-04-11 MED ORDER — ACETAMINOPHEN 325 MG PO TABS: 650.0000 mg | ORAL_TABLET | Freq: Four times a day (QID) | ORAL | Status: DC | PRN

## 2015-04-11 MED ORDER — DOXYCYCLINE HYCLATE 100 MG PO CAPS: 100.0000 mg | ORAL_CAPSULE | Freq: Two times a day (BID) | ORAL | Status: DC

## 2015-04-11 NOTE — Progress Notes (Signed)
04/11/2015 10:58 AM  [address] to be D/C'd Home per MD order.  Discussed prescriptions and follow up appointments with the patient. Prescriptions given to patient, medication list explained in detail. Pt verbalized understanding.    Medication List    TAKE these medications        acetaminophen 325 MG tablet  Commonly known as:  TYLENOL  Take 2 tablets (650 mg total) by mouth every 6 (six) hours as needed for mild pain (or Fever >/= 101).     albuterol 108 (90 Base) MCG/ACT inhaler  Commonly known as:  PROVENTIL HFA;VENTOLIN HFA  Inhale 2 puffs into the lungs every 6 (six) hours as needed for wheezing or shortness of breath.     doxycycline 100 MG capsule  Commonly known as:  VIBRAMYCIN  Take 1 capsule (100 mg total) by mouth 2 (two) times daily.     escitalopram 20 MG tablet  Commonly known as:  LEXAPRO  Take 20 mg by mouth at bedtime. Reported on 03/13/2015     losartan 50 MG tablet  Commonly known as:  COZAAR  Take 1 tablet by mouth daily. Reported on 03/13/2015     metoprolol tartrate 25 MG tablet  Commonly known as:  LOPRESSOR  Take 1 tablet (25 mg total) by mouth 2 (two) times daily.     nitroGLYCERIN 0.4 MG SL tablet  Commonly known as:  NITROSTAT  Place 1 tablet (0.4 mg total) under the tongue every 5 (five) minutes as needed. For chest pain     predniSONE 20 MG tablet  Commonly known as:  DELTASONE  Take 1 tablet (20 mg total) by mouth daily with breakfast.     tamsulosin 0.4 MG Caps capsule  Commonly known as:  FLOMAX  Take 0.4 mg by mouth daily. Reported on 03/13/2015     warfarin 5 MG tablet  Commonly known as:  COUMADIN  Take 5-7.5 mg by mouth See admin instructions. Take as directed by the coumadin clinc. Take 1 tablet ('5mg'$ ) for 3 days, then on the 4th day takes 1.5 tablets (7.'5mg'$ ), then start over. (does not take 7.'5mg'$  on a specific day of the week)     zolpidem 5 MG tablet  Commonly known as:  AMBIEN  Take 5 mg by mouth at bedtime as needed for  sleep. Reported on 03/13/2015        Filed Vitals:   04/11/15 0408 04/11/15 0858  BP: 128/59 106/61  Pulse: 88 92  Temp: 98.7 F (37.1 C) 98.3 F (36.8 C)  Resp: 20 18    Skin clean, dry and intact without evidence of skin break down, no evidence of skin tears noted. IV catheter discontinued intact. Site without signs and symptoms of complications. Dressing and pressure applied. Pt denies pain at this time. No complaints noted.  An After Visit Summary was printed and given to the patient. Patient escorted via [place], and D/C home via private auto.  [employer], RN-BC, [facility] Phone [phone]

## 2015-04-11 NOTE — Progress Notes (Addendum)
[place] for Warfarin  Indication: History of PE  Allergies  Allergen Reactions  . Iohexol      Desc: PT STATED TODAY HE GETS SLIGHT SOB FROM IV CONTRAST. HE NEEDS FULL PREMEDS FROM NOW ON PER DR. MATTERN   . Penicillins Itching and Rash    "haven't taken any  in 40 years" Has patient had a PCN reaction causing immediate rash, facial/tongue/throat swelling, SOB or lightheadedness with hypotension: NO Has patient had a PCN reaction causing severe rash involving mucus membranes or skin necrosis:NO Has patient had a PCN reaction that required hospitalization NO Has patient had a PCN reaction occurring within the last 10 years: NO If all of the above answers are "NO", then may proceed with Cephalosporin use.    Patient Measurements: Height: '5\' 11"'$  (180.3 cm) Weight: 134 lb (60.782 kg) IBW/kg (Calculated) : 75.3 Vital Signs: Temp: 98.3 F (36.8 C) (01/19 0858) Temp Source: Oral (01/19 0858) BP: 106/61 mmHg (01/19 0858) Pulse Rate: 92 (01/19 0858)  Labs:  Recent Labs  04/09/15 1204 04/10/15 0558 04/11/15 0550  HGB 16.5 15.0  --   HCT 48.5 44.1  --   PLT 247 211  --   LABPROT 23.1* 21.1* 21.5*  INR 2.06* 1.83* 1.87*  CREATININE 0.89 0.88  --     Estimated Creatinine Clearance: 57.6 mL/min (by C-G formula based on Cr of 0.88).  Assessment: 82 y/o M here with bilateral ankle pain/swelling, continuing warfarin PTA for history of PE.  INR is 2.06 on admit, currently 1.87. Hgb 15, plts 211- no bleeding noted.  Confirmed home dose with patient/wife and [facility] pharmacy this morning- takes '5mg'$  for 3 days, then takes 7.'5mg'$  for 1 day, then repeat.   Goal of Therapy:  INR 2-3 Monitor platelets by anticoagulation protocol: Yes   Plan:  -Warfarin 7.5 mg PO x 1 tonight- no dose entered for tonight as plan is for patient's discharge. Contact pharmacy if a dose needs to be entered. -starting 1/20, patient can resume home dosing of  '5mg'$  x3 days, then 7.'5mg'$  x1 day, then repeat. Recommend INR check on Monday 1/23  -Daily PT/INR while in the hospital -Monitor for bleeding  Israel Werts D. Oziah Vitanza, PharmD, BCPS Clinical Pharmacist Pager: [phone] 04/11/2015 9:43 AM

## 2015-04-11 NOTE — Discharge Summary (Addendum)
Physician Discharge Summary  [name] RWE:[id number] DOB: [birth year] DOA: 04/09/2015  PCP: [name], MD  Admit date: 04/09/2015 Discharge date: 04/11/2015  Time spent: >35 minutes  Recommendations for Outpatient Follow-up:  PCP in 1 week as needed  Discharge Diagnoses:  Principal Problem:   Cellulitis of right ankle Active Problems:   Coronary atherosclerosis   PACEMAKER-[employer].Jude   Joint pain   Chronic anticoagulation   Benign essential HTN   Cellulitis   Discharge Condition: stable   Diet recommendation: low sodium   Filed Weights   04/09/15 2306 04/10/15 1952  Weight: 62.324 kg (137 lb 6.4 oz) 60.782 kg (134 lb)    History of present illness:  82 y.o. Male with PMH of HTN, HLD, COPD, PE (chronic anticoagulation with warfarin), and PPM implant who was referred to the ED by his PCP for evaluation of bilateral ankle pain, redness, and swelling, most significant on the right -admitted with r foot cellulitis and possible gout    Hospital Course:  1. R foot cellulitis. Clinically improved on IV atx. Erythema, edema resolved. transitioned to doxy oral to complete the treatment regimen   2. Possible gout-R ankle? Triggered by alcohol use. X ray; No acute fractures.  -Pt improved on steroids. Recommended to stop alcohol use. No indication for allopurinol at this time  3. Chronic anticoagulation for history of PE. Cont coumadin. F/u with INR as outpatient with PCP 4.HTN-Continue home medications    Procedures:  None  (i.e. Studies not automatically included, echos, thoracentesis, etc; not x-rays)  Consultations: none  Discharge Exam: Filed Vitals:   04/11/15 0408 04/11/15 0858  BP: 128/59 106/61  Pulse: 88 92  Temp: 98.7 F (37.1 C) 98.3 F (36.8 C)  Resp: 20 18    General: alert. No distress  Cardiovascular: s1,s2 rrr Respiratory: CTA BL  Discharge Instructions  Discharge Instructions    Diet - low sodium heart healthy    Complete by:   As directed      Discharge instructions    Complete by:  As directed   Please follow up with primary care doctor in 1 week     Increase activity slowly    Complete by:  As directed             Medication List    TAKE these medications        acetaminophen 325 MG tablet  Commonly known as:  TYLENOL  Take 2 tablets (650 mg total) by mouth every 6 (six) hours as needed for mild pain (or Fever >/= 101).     albuterol 108 (90 Base) MCG/ACT inhaler  Commonly known as:  PROVENTIL HFA;VENTOLIN HFA  Inhale 2 puffs into the lungs every 6 (six) hours as needed for wheezing or shortness of breath.     doxycycline 100 MG capsule  Commonly known as:  VIBRAMYCIN  Take 1 capsule (100 mg total) by mouth 2 (two) times daily.     escitalopram 20 MG tablet  Commonly known as:  LEXAPRO  Take 20 mg by mouth at bedtime. Reported on 03/13/2015     losartan 50 MG tablet  Commonly known as:  COZAAR  Take 1 tablet by mouth daily. Reported on 03/13/2015     metoprolol tartrate 25 MG tablet  Commonly known as:  LOPRESSOR  Take 1 tablet (25 mg total) by mouth 2 (two) times daily.     nitroGLYCERIN 0.4 MG SL tablet  Commonly known as:  NITROSTAT  Place 1 tablet (0.4 mg  total) under the tongue every 5 (five) minutes as needed. For chest pain     predniSONE 20 MG tablet  Commonly known as:  DELTASONE  Take 1 tablet (20 mg total) by mouth daily with breakfast.     tamsulosin 0.4 MG Caps capsule  Commonly known as:  FLOMAX  Take 0.4 mg by mouth daily. Reported on 03/13/2015     warfarin 5 MG tablet  Commonly known as:  COUMADIN  Take 5-7.5 mg by mouth See admin instructions. Take as directed by the coumadin clinc. Take 1 tablet ('5mg'$ ) for 3 days, then on the 4th day takes 1.5 tablets (7.'5mg'$ ), then start over. (does not take 7.'5mg'$  on a specific day of the week)     zolpidem 5 MG tablet  Commonly known as:  AMBIEN  Take 5 mg by mouth at bedtime as needed for sleep. Reported on 03/13/2015        Allergies  Allergen Reactions  . Iohexol      Desc: PT STATED TODAY HE GETS SLIGHT SOB FROM IV CONTRAST. HE NEEDS FULL PREMEDS FROM NOW ON PER DR. MATTERN   . Penicillins Itching and Rash    "haven't taken any  in 40 years" Has patient had a PCN reaction causing immediate rash, facial/tongue/throat swelling, SOB or lightheadedness with hypotension: NO Has patient had a PCN reaction causing severe rash involving mucus membranes or skin necrosis:NO Has patient had a PCN reaction that required hospitalization NO Has patient had a PCN reaction occurring within the last 10 years: NO If all of the above answers are "NO", then may proceed with Cephalosporin use.       Follow-up Information    Follow up with [name], MD In 1 week.   Specialty:  Family Medicine   Contact information:   [place] [address] [phone]        The results of significant diagnostics from this hospitalization (including imaging, microbiology, ancillary and laboratory) are listed below for reference.    Significant Diagnostic Studies: Dg Ankle Complete Right  04/09/2015  CLINICAL DATA:  Right ankle pain and swelling, worse today. No known injury. Initial encounter. EXAM: RIGHT ANKLE - COMPLETE 3+ VIEW COMPARISON:  None. FINDINGS: Soft tissues about the ankle are swollen. No soft tissue gas collection or foreign body is identified. No acute or focal bony or joint abnormality is seen. Calcaneal spurring is identified. Atherosclerotic calcifications are seen. IMPRESSION: Soft tissue swelling without underlying acute bony or joint abnormality. Calcaneal spurring. Atherosclerosis. Electronically Signed   By: Inge Rise M.D.   On: 04/09/2015 17:04    Microbiology: No results found for this or any previous visit (from the past 240 hour(s)).   Labs: Basic Metabolic Panel:  Recent Labs Lab 04/09/15 1204 04/10/15 0558  NA 140 139  K 3.8 3.6  CL 104 106  CO2 28 26   GLUCOSE 124* 86  BUN 9 10  CREATININE 0.89 0.88  CALCIUM 11.0* 10.4*   Liver Function Tests: No results for input(s): AST, ALT, ALKPHOS, BILITOT, PROT, ALBUMIN in the last 168 hours. No results for input(s): LIPASE, AMYLASE in the last 168 hours. No results for input(s): AMMONIA in the last 168 hours. CBC:  Recent Labs Lab 04/09/15 1204 04/10/15 0558  WBC 9.3 7.8  NEUTROABS 6.3  --   HGB 16.5 15.0  HCT 48.5 44.1  MCV 93.3 93.8  PLT 247 211   Cardiac Enzymes: No results for input(s): CKTOTAL, CKMB, CKMBINDEX, TROPONINI  in the last 168 hours. BNP: BNP (last 3 results) No results for input(s): BNP in the last 8760 hours.  ProBNP (last 3 results) No results for input(s): PROBNP in the last 8760 hours.  CBG: No results for input(s): GLUCAP in the last 168 hours.     SignedKinnie Feil  [facility] 04/11/2015, 10:38 AM

## 2015-04-29 ENCOUNTER — Ambulatory Visit (INDEPENDENT_AMBULATORY_CARE_PROVIDER_SITE_OTHER): Payer: Medicare Other | Admitting: Internal Medicine

## 2015-04-29 ENCOUNTER — Encounter: Payer: Self-pay | Admitting: Internal Medicine

## 2015-04-29 VITALS — BP 136/71 | HR 97 | Ht 71.0 in | Wt 161.8 lb

## 2015-04-29 DIAGNOSIS — I442 Atrioventricular block, complete: Secondary | ICD-10-CM

## 2015-04-29 DIAGNOSIS — I471 Supraventricular tachycardia: Secondary | ICD-10-CM | POA: Insufficient documentation

## 2015-04-29 DIAGNOSIS — I10 Essential (primary) hypertension: Secondary | ICD-10-CM | POA: Diagnosis not present

## 2015-04-29 LAB — CUP PACEART INCLINIC DEVICE CHECK
Date Time Interrogation Session: 20170206154843
Implantable Lead Location: 753860
Lead Channel Pacing Threshold Amplitude: 1.25 V
Lead Channel Pacing Threshold Pulse Width: 1 ms
Lead Channel Pacing Threshold Pulse Width: 1 ms
Lead Channel Sensing Intrinsic Amplitude: 4.9 mV
Lead Channel Setting Pacing Amplitude: 3 V
MDC IDC LEAD IMPLANT DT: 19910722
MDC IDC LEAD IMPLANT DT: 19910722
MDC IDC LEAD LOCATION: 753859
MDC IDC MSMT BATTERY REMAINING LONGEVITY: 124.8
MDC IDC MSMT BATTERY VOLTAGE: 2.95 V
MDC IDC MSMT LEADCHNL RA IMPEDANCE VALUE: 475 Ohm
MDC IDC MSMT LEADCHNL RA PACING THRESHOLD AMPLITUDE: 1.25 V
MDC IDC MSMT LEADCHNL RA PACING THRESHOLD PULSEWIDTH: 1 ms
MDC IDC MSMT LEADCHNL RA PACING THRESHOLD PULSEWIDTH: 1 ms
MDC IDC MSMT LEADCHNL RV IMPEDANCE VALUE: 375 Ohm
MDC IDC MSMT LEADCHNL RV PACING THRESHOLD AMPLITUDE: 1.5 V
MDC IDC MSMT LEADCHNL RV PACING THRESHOLD AMPLITUDE: 1.5 V
MDC IDC MSMT LEADCHNL RV SENSING INTR AMPL: 12 mV
MDC IDC PG SERIAL: 7167612
MDC IDC SET LEADCHNL RA PACING AMPLITUDE: 2.5 V
MDC IDC SET LEADCHNL RV PACING PULSEWIDTH: 1 ms
MDC IDC SET LEADCHNL RV SENSING SENSITIVITY: 2 mV
MDC IDC STAT BRADY RA PERCENT PACED: 1.6 %
MDC IDC STAT BRADY RV PERCENT PACED: 0.01 %
Pulse Gen Model: 2210

## 2015-04-29 NOTE — Patient Instructions (Addendum)
Medication Instructions:  Your physician recommends that you continue on your current medications as directed. Please refer to the Current Medication list given to you today.   Labwork: None ordered   Testing/Procedures: None ordered   Follow-Up: Your physician wants you to follow-up in: 12 months with Chanetta Marshall, NP. You will receive a reminder letter in the mail two months in advance. If you don't receive a letter, please call our office to schedule the follow-up appointment.  Remote monitoring is used to monitor your Pacemaker  from home. This monitoring reduces the number of office visits required to check your device to one time per year. It allows [place] to keep an eye on the functioning of your device to ensure it is working properly. You are scheduled for a device check from home on 07/29/15. You may send your transmission at any time that day. If you have a wireless device, the transmission will be sent automatically. After your physician reviews your transmission, you will receive a postcard with your next transmission date.      Any Other Special Instructions Will Be Listed Below (If Applicable).     If you need a refill on your cardiac medications before your next appointment, please call your pharmacy.  Marland Kitchen

## 2015-04-29 NOTE — Progress Notes (Signed)
Electrophysiology Office Note   Date:  04/29/2015   ID:  [name], DOB 1932-09-21, MRN [id number]  PCP:  [name], MD   Primary Electrophysiologist: Thompson Grayer, MD    Chief Complaint  Patient presents with  . CHB     History of Present Illness: [name] is a 82 y.o. male who presents today for electrophysiology evaluation.  He is doing well.  He says that his chest pain and SOB have improved.  He did not proceed with myoview as I had ordered in June.  He says "I feel fine".  He is not interested in any additional workup at this time.  He is recovering from recent hospitalization for gout. He was started on metoprolol in December for atrial tachycardia.  He did have some palpitations which are now resolved.  He is tolerating metoprolol without difficulty. Today, he denies symptoms of dizziness, presyncope, syncope, bleeding, or neurologic sequela. The patient is tolerating medications without difficulties and is otherwise without complaint today.    Past Medical History  Diagnosis Date  . Hyperlipidemia   . Hypertension   . Esophageal reflux   . Diverticulosis   . Rheumatic fever ~ 1944  . Vision loss of left eye     S/P "cataract OR"; "can see a little; not much"  . Kidney stones   . Hard of hearing, left   . Pulmonary embolism (HCC)     On Xarelto  . Hypercalcemia   . Arthritis     "knees"  . CAD (coronary artery disease)     nonobstructive by cath 07/22/11 with small to moderate sized diagonal branch with ostial stenosis , medical therapy advised  . AV block     s/p PPM  . COPD (chronic obstructive pulmonary disease) ([place])   . Carotid artery aneurysm (HCC)     Bilateral internal carotid artery aneurysms followed by Dr. Donnetta Hutching  . Shortness of breath     with ambulation  . Constipation   . Shingles 12/04/2012    right lateral anterior chest  . Radiation 10/7,10/9,01/03/2013    SBRT Left upper lobe lung  . Prostate cancer ([place])     s/p  cryotherapy  . Lung cancer, primary, with metastasis from lung to other site [facility]) dx'd 09/2011 & 11/2012  . Anemia    Past Surgical History  Procedure Laterality Date  . Cystoscopy    . Lung surgery  1957    "born w/bleb in [place]  . Insert / replace / remove pacemaker  1991    initial placement  . Insert / replace / remove pacemaker  ~ 2011    "changed out"  . Tonsillectomy and adenoidectomy  1943  . Appendectomy  2000's  . Cholecystectomy  2000's  . Cataract extraction w/ intraocular lens implant  ~ 2008    left eye  . Lithotripsy  ~ 2011    "twice"  . Cardiac catheterization    . Prostate cryoablation  ~ 2008  . Video bronchoscopy  10/20/2011    Procedure: VIDEO BRONCHOSCOPY;  Surgeon: Grace Isaac, MD;  Location: [facility];  Service: Thoracic;  Laterality: N/A;  . Needle core biopsy  12/06/2012    Lung/lul  . Esophagogastroduodenoscopy N/A 09/20/2013    Procedure: ESOPHAGOGASTRODUODENOSCOPY (EGD);  Surgeon: Winfield Cunas., MD;  Location[facility] ENDOSCOPY;  Service: Endoscopy;  Laterality: N/A;  . Left heart catheterization with coronary angiogram N/A 07/22/2011    Procedure: LEFT HEART CATHETERIZATION WITH CORONARY ANGIOGRAM;  Surgeon: Burnell Blanks, MD;  Location: [facility] CATH LAB;  Service: Cardiovascular;  Laterality: N/A;     Current Outpatient Prescriptions  Medication Sig Dispense Refill  . albuterol (PROVENTIL HFA;VENTOLIN HFA) 108 (90 BASE) MCG/ACT inhaler Inhale 2 puffs into the lungs every 6 (six) hours as needed for wheezing or shortness of breath.    . metoprolol tartrate (LOPRESSOR) 25 MG tablet Take 1 tablet (25 mg total) by mouth 2 (two) times daily. 60 tablet 1  . nitroGLYCERIN (NITROSTAT) 0.4 MG SL tablet Place 1 tablet (0.4 mg total) under the tongue every 5 (five) minutes as needed. For chest pain 30 tablet 3  . rOPINIRole (REQUIP) 0.5 MG tablet Take 1 tablet by mouth at bedtime as needed. For RLS    . warfarin (COUMADIN) 5 MG tablet Take 5-7.5 mg by mouth  See admin instructions. Take as directed by the coumadin clinc. Take 1 tablet ('5mg'$ ) for 3 days, then on the 4th day takes 1.5 tablets (7.'5mg'$ ), then start over. (does not take 7.'5mg'$  on a specific day of the week)     No current facility-administered medications for this visit.    Allergies:   Iohexol and Penicillins   Social History:  The patient  reports that he has been smoking Cigarettes.  He has a 50 pack-year smoking history. His smokeless tobacco use includes Chew. He reports that he does not drink alcohol or use illicit drugs.   Family History:  The patient's family history includes Brain cancer (age of onset: 32) in his father; Breast cancer in his mother; Cancer in his mother; Emphysema in his sister. There is no history of Hypertension, Hyperlipidemia, Heart failure, Heart disease, or Colon cancer.    ROS:  Please see the history of present illness.   All other systems are reviewed and negative.    PHYSICAL EXAM: VS:  BP 136/71 mmHg  Pulse 97  Ht '5\' 11"'$  (1.803 m)  Wt 161 lb 12.8 oz (73.392 kg)  BMI 22.58 kg/m2 , BMI Body mass index is 22.58 kg/(m^2). GEN: Well nourished, well developed, in no acute distress HEENT: normal Neck: no JVD, carotid bruits, or masses Cardiac: RRR; no murmurs, rubs, or gallops,no edema, diminished DP/PT pulses bilaterally Respiratory:  clear to auscultation bilaterally, normal work of breathing GI: soft, nontender, nondistended, + BS MS: no deformity or atrophy Skin: warm and dry, device pocket is well healed Neuro:  Strength and sensation are intact Psych: euthymic mood, full affect   Device interrogation is reviewed today in detail.  See PaceArt for details.   Recent Labs: 05/28/2014: ALT 81*; TSH 2.26 04/10/2015: BUN 10; Creatinine, Ser 0.88; Hemoglobin 15.0; Platelets 211; Potassium 3.6; Sodium 139    Lipid Panel     Component Value Date/Time   CHOL 192 07/22/2011 0153   TRIG 76 07/22/2011 0153   HDL 57 07/22/2011 0153   CHOLHDL 3.4  07/22/2011 0153   VLDL 15 07/22/2011 0153   LDLCALC 120* 07/22/2011 0153     Wt Readings from Last 3 Encounters:  04/29/15 161 lb 12.8 oz (73.392 kg)  04/10/15 134 lb (60.782 kg)  02/13/15 146 lb (66.225 kg)      ASSESSMENT AND PLAN:  1.  Chronic difficulty with chest pain and SOB Improved per patient He is not interested in any additional workup at this time  2. Claudication symptoms He has known CVD and heavy h/o tobacco diminished pulses on exam I will have my nurse contact Dr Luther Parody office for follow-up  3.  HTN Stable No change required today  4. Tobacco Doesn't smoke but chews Cessation is advised  5. Transient AV block V paces <1% Chronically elevated thresholds appear stable from 2011 gen change (Dr Diona Browner op notes reviewed)  6. Prior PTE on coumadin  7. SVT Very low burden Appears to be atach by PPM interrogation Will treat with metoprolol twice daily He can take additional metoprolol when needed  Continue remote monitoring Return to see EP NP in 1 year  Current medicines are reviewed at length with the patient today.   The patient does not have concerns regarding his medicines.  The following changes were made today:  none  Labs/ tests ordered today include:  Orders Placed This Encounter  Procedures  . Implantable device check     Signed, Thompson Grayer, MD  04/29/2015 4:03 PM     [facility] [address] [place] [address] [phone] (office) [phone] (fax)

## 2015-06-05 ENCOUNTER — Other Ambulatory Visit: Payer: Self-pay | Admitting: Physician Assistant

## 2015-07-12 ENCOUNTER — Other Ambulatory Visit: Payer: Self-pay | Admitting: *Deleted

## 2015-07-12 DIAGNOSIS — R911 Solitary pulmonary nodule: Secondary | ICD-10-CM

## 2015-07-29 ENCOUNTER — Telehealth: Payer: Self-pay | Admitting: Cardiology

## 2015-07-29 ENCOUNTER — Encounter: Payer: Medicare Other | Admitting: *Deleted

## 2015-07-29 NOTE — Telephone Encounter (Signed)
LMOVM reminding pt to send remote transmission.   

## 2015-08-02 ENCOUNTER — Encounter: Payer: Self-pay | Admitting: Cardiology

## 2015-08-08 ENCOUNTER — Telehealth: Payer: Self-pay | Admitting: Internal Medicine

## 2015-08-08 NOTE — Telephone Encounter (Signed)
Returned patient's call.  [name] states that [name] has not been able to get his Location manager to work since 2015.  Patient reports that [name] brought it to his appointment on 04/29/15 with Dr. Rayann Heman and it was taken from him but [name] did not receive a new one.  [name] currently has no monitor.  Advised patient that I will speak with a [employer] representative tomorrow to discuss ordering him a new monitor and a cell adapter.  Patient is agreeable to this plan and is appreciative of call.

## 2015-08-08 NOTE — Telephone Encounter (Signed)
New MEssage  Pt received [employer] letter for remote- pt stated he is having trouble w/ equipment- Please call back and discuss.

## 2015-08-09 ENCOUNTER — Encounter: Payer: Self-pay | Admitting: *Deleted

## 2015-08-09 NOTE — Telephone Encounter (Signed)
Called [employer] and ordered Production assistant, radio for patient.  Patient made aware.  Patient agreeable to device clinic appointment on 09/18/15 at 2:00pm for assistance with [facility] transmitter set-up.  He is aware to bring his transmitter with him to this appointment.  [employer] rep made aware that patient will need cell adapter at this appointment.  Patient denies additional questions at this time.

## 2015-08-15 ENCOUNTER — Ambulatory Visit
Admission: RE | Admit: 2015-08-15 | Discharge: 2015-08-15 | Disposition: A | Discharge status: Home / Self Care | Payer: Medicare Other | Source: Ambulatory Visit | Attending: Cardiothoracic Surgery | Admitting: Cardiothoracic Surgery

## 2015-08-15 ENCOUNTER — Encounter: Payer: Self-pay | Admitting: Cardiothoracic Surgery

## 2015-08-15 ENCOUNTER — Other Ambulatory Visit: Payer: Self-pay | Admitting: *Deleted

## 2015-08-15 ENCOUNTER — Ambulatory Visit (INDEPENDENT_AMBULATORY_CARE_PROVIDER_SITE_OTHER): Payer: Medicare Other | Admitting: Cardiothoracic Surgery

## 2015-08-15 VITALS — BP 129/69 | HR 65 | Resp 16 | Ht 71.0 in | Wt 133.5 lb

## 2015-08-15 DIAGNOSIS — I7121 Aneurysm of the ascending aorta, without rupture: Secondary | ICD-10-CM

## 2015-08-15 DIAGNOSIS — I712 Thoracic aortic aneurysm, without rupture: Secondary | ICD-10-CM

## 2015-08-15 DIAGNOSIS — C3492 Malignant neoplasm of unspecified part of left bronchus or lung: Secondary | ICD-10-CM

## 2015-08-15 DIAGNOSIS — R911 Solitary pulmonary nodule: Secondary | ICD-10-CM

## 2015-08-15 DIAGNOSIS — R918 Other nonspecific abnormal finding of lung field: Secondary | ICD-10-CM

## 2015-08-15 NOTE — Progress Notes (Signed)
[address]       [place] [address]             [phone]                         [facility] [place] Medical Record #[id number] Date of Birth: Sep 28, 1932  [name], * [name] Downing, MD  Chief Complaint:   PostOp Follow Up Visit 10/20/2011  DATE OF DISCHARGE:  OPERATIVE REPORT  Preop: Non small cell Lung cancer- left  Postop: same  SURGICAL PROCEDURE: Repeat left Mini thoracotomy, wedge resection, and  segmentectomy of the superior segment of the left lower lobe, and  placement of On-Q device.    pT1b, pNX, MX. INVASIVE MODERATELY DIFFERENTIATED ADENOCARCINOMA, SPANNING 2.2 CM IN GREATEST DIMENSION.   Wedge resection 10/20/2011  Stereotactic radio therapy to left upper lobe lesion in October 2014  Needle BX of left upper lobe lesion: [id number]  ALK negative, EGFR  positive Lung, needle/core biopsy(ies), LUL - POSITIVE FOR ADENOCARCINOMA.  History of Present Illness:     Patient returns to the office today in follow-up followup after surgical wedge resection of a T1 B. moderately differentiated adenocarcinoma the lung 2 cm in size located in the superior segment of the left lower lobe 10/20/2011. The patient previously had full left thoracotomy many years ago for "cyst".    An separate  enlarging lesion in the left upper lobe was biopsy proven adenocarcinoma and he underwent stereotactic radiotherapy in the fall of 2014  The patient comes in today with continued with follow-up CT scan of the chest. He notes that over the past 2-3 months he's had additional weight loss thinks up to 13 pounds. He's had no fever chills. No night sweats He says he has no appetite. His daughter notes that he continues to drink beer on a daily basis and continues smoking.    History  Smoking status  . Current Some Day Smoker -- 1.00 packs/day for 50 years  . Types: Cigarettes  . Last Attempt to Quit: 03/23/2004  Smokeless tobacco  .  Current User  . Types: Chew       Allergies  Allergen Reactions  . Iohexol      Desc: PT STATED TODAY HE GETS SLIGHT SOB FROM IV CONTRAST. HE NEEDS FULL PREMEDS FROM NOW ON PER DR. MATTERN   . Penicillins Itching and Rash    "haven't taken any  in 40 years" Has patient had a PCN reaction causing immediate rash, facial/tongue/throat swelling, SOB or lightheadedness with hypotension: NO Has patient had a PCN reaction causing severe rash involving mucus membranes or skin necrosis:NO Has patient had a PCN reaction that required hospitalization NO Has patient had a PCN reaction occurring within the last 10 years: NO If all of the above answers are "NO", then may proceed with Cephalosporin use.    Current Outpatient Prescriptions  Medication Sig Dispense Refill  . albuterol (PROVENTIL HFA;VENTOLIN HFA) 108 (90 BASE) MCG/ACT inhaler Inhale 2 puffs into the lungs every 6 (six) hours as needed for wheezing or shortness of breath.    . metoprolol tartrate (LOPRESSOR) 25 MG tablet TAKE ONE BY MOUTH TWO TIMES A DAY. 60 tablet 10  . nitroGLYCERIN (NITROSTAT) 0.4 MG SL tablet Place 1 tablet (0.4 mg total) under the tongue every 5 (five) minutes as needed. For chest pain 30 tablet 3  . rOPINIRole (REQUIP) 0.5 MG tablet Take 1  tablet by mouth at bedtime as needed. For RLS    . warfarin (COUMADIN) 5 MG tablet Take 5-7.5 mg by mouth See admin instructions. Take as directed by the coumadin clinc. Take 1 tablet (62m) for 3 days, then on the 4th day takes 1.5 tablets (7.548m, then start over. (does not take 7.40m37mn a specific day of the week)    . zolpidem (AMBIEN) 5 MG tablet Take 5 mg by mouth at bedtime as needed for sleep.     No current facility-administered medications for this visit.    Wt Readings from Last 3 Encounters:  08/15/15 133 lb 8 oz (60.555 kg)  04/29/15 161 lb 12.8 oz (73.392 kg)  04/10/15 134 lb (60.782 kg)     Physical Exam: BP 129/69 mmHg  Pulse 65  Resp 16  Ht _0   (1.803 m)  Wt 133 lb 8 oz (60.555 kg)  BMI 18.63 kg/m2  SpO2 97% Patient in sinus rhythm on palpation today General appearance: alert and cooperative Neurologic: intact Heart: regular rate and rhythm, S1, S2 normal, no murmur, click, rub or gallop and normal apical impulse Lungs: clear to auscultation bilaterally and normal percussion bilaterally Abdomen: soft, non-tender; bowel sounds normal; no masses,  no organomegaly Extremities: extremities normal, atraumatic, no cyanosis or edema and Homans sign is negative, no sign of DVT Wounds:well healed.  No cervical or supraclavicular or axillary adenopathy  a fine papular rash over his torso  Diagnostic Studies & Laboratory data:         Recent Radiology Findings: Ct Chest Wo Contrast  08/15/2015  CLINICAL DATA:  History of left lung cancer in 2013.  Former smoker. EXAM: CT CHEST WITHOUT CONTRAST TECHNIQUE: Multidetector CT imaging of the chest was performed following the standard protocol without IV contrast. COMPARISON:  02/07/2015 FINDINGS: Mediastinum: The heart size appears normal. Aortic atherosclerosis noted. Calcification involving the LAD coronary artery noted. There is no mediastinal or hilar adenopathy. There is no axillary or supraclavicular adenopathy. Lungs/Pleura: No pleural fluid identified. Moderate to advanced changes of centrilobular and paraseptal emphysema. The left upper lobe pulmonary nodule is unchanged measuring 4 mm, image 43 of series 4. Sub solid nodule within the posterior right upper lobe measures 1.7 x 1.9 cm and has an equivalent diameter of 1.8 cm, image number 53 of series 4. On the previous exam this had an equivalent diameter of 1.5 cm. In the left lower lobe there is a 8 x 7 mm nodule (8 mm mean diameter) on image 63 of series 4. Previously this had an equivalent diameter of 7 mm. In the right lower low there is a 9 x 7 mm nodule (8 mm mean diameter) on image 85 of series 4. This is unchanged from previous exam.  Multiple new cavitary nodules are scattered throughout both lungs. For example, in the right lower lobe new cavitary nodule measures 7 by 6 mm, mean diameter 7 mm, image 66 of series 4. In the left lower lobe posteriorly there is a new cavitary 6 x 6 mm nodule (6 mm mean diameter) on image 78 of series 4. In the right lower lobe there is a new cavitary nodule measuring 4 x 5 mm nodule (5 mm mean diameter) on image 87 of series 4. Upper Abdomen: There is no acute findings identified within the upper abdomen. The adrenal glands are normal. There are bilateral renal cysts of varying density. These are incompletely characterized without IV contrast. Small nonobstructing calculi are noted within the upper pole  of the left kidney. Musculoskeletal: No aggressive lytic or sclerotic bone lesions identified. Degenerative disc disease noted within the thoracic spine. Electronically Signed   By: Kerby Moors M.D.   On: 08/15/2015 11:04      Ct Chest Wo Contrast  02/07/2015  CLINICAL DATA:  Left lung cancer 2013, status post surgery and radiation. Prostate cancer 2008. EXAM: CT CHEST WITHOUT CONTRAST TECHNIQUE: Multidetector CT imaging of the chest was performed following the standard protocol without IV contrast. COMPARISON:  08/16/2014 FINDINGS: Mediastinum/Nodes: Upper right paratracheal node 8 mm in short axis on image 20 series 3, stable by my measurement. No pathologic adenopathy is observed. Atherosclerotic aortic arch. Ascending thoracic aorta 4.3 cm diameter. The dual lead pacer noted. Lungs/Pleura: Severe emphysema. Architectural distortion and density at the left lung apex is increased from the prior exam, image 14 series 4 compared image 15 of series 4 of the prior exam. However, the increased density is disorganized, irregular, surrounds varies bulla, and accordingly is difficult to measure. A lot of this increase in density could be inflammatory. Right lower lobe 0.9 by 0.7 cm ground-glass opacity nodule,  stable by my measurements from 08/16/2014, but slowly increasing from 08/18/2011. Superior segment right lower lobe sub solid nodule 1.3 by 1.7 cm on image 27 series 4, essentially stable from 08/16/2014 but increased from 08/18/2011. 0.6 by 0.4 cm super segment left lower lobe nodule on image 24 series 4, previously 0.4 by 0.4 cm on 08/16/2014 and perhaps 2 mm in diameter on 08/18/2011. Left lower lobe solid nodule 0.7 by 0.5 cm on image 35 series 4, stable by my measurements on 08/16/2014 and previously measuring 0.5 by 0.4 cm on 08/18/2011. Upper abdomen: Cholecystectomy. Hypodense lesions of both renal upper poles favoring cysts given the photopenia on PET-CT. Musculoskeletal: Thoracic spondylosis. IMPRESSION: 1. Increase in architectural distortion and primarily interstitial density at the left lung apex, possibly from radiation therapy or inflammatory process, less likely to be locally recurrent malignancy. Merits observation. 2. Several ground-glass nodules and several small left-sided solid nodules are present in the lungs. These are stable from 08/16/2014 but for the most part have enlarged compared to 08/18/11. Multi focal low grade adenocarcinoma is certainly not excluded based on these observations. These have not been appreciably hypermetabolic on PET-CT. 3. Aneurysmal ascending aorta. Recommend annual imaging followup by CTA or MRA. This recommendation follows 2010 ACCF/AHA/AATS/ACR/ASA/SCA/SCAI/SIR/STS/SVM Guidelines for the Diagnosis and Management of Patients with Thoracic Aortic Disease. Circulation. 2010; 121: E315-Q008 4. Severe emphysema. Electronically Signed   By: Van Clines M.D.   On: 02/07/2015 10:41    Ct Chest Wo Contrast  08/16/2014   CLINICAL DATA:  Left lower lobe lung cancer restaging  EXAM: CT CHEST WITHOUT CONTRAST  TECHNIQUE: Multidetector CT imaging of the chest was performed following the standard protocol without IV contrast.  COMPARISON:  05/18/2014 CT abdomen/  pelvis, most recent chest CT 12/28/2013  FINDINGS: Mediastinum/Nodes: Right-sided pacer in place. Ascending aortic ectasia measuring 4.2 cm image 33 again noted.  Heart size is normal. High pretracheal lymph node measuring 0.7 cm stable. No new hilar or mediastinal lymphadenopathy. No pericardial effusion.  Lungs/Pleura: Emphysematous changes are noted. Nodular ground-glass opacity right lower lobe measuring 0.8 cm image 47 is stable. 3 mm left upper lobe pulmonary nodule image 26 is stable. Subpleural nodularity predominantly in the left upper and lower lobes is subjectively stable. Stable left lower lobe 0.5 cm nodule image 38. Left upper lobe/perihilar linear presumed radiation fibrosis/scarring is stable. No pleural effusion.  Upper abdomen: Bilateral renal cortical cysts are again noted, partly visualized.  Musculoskeletal: No lytic or sclerotic osseous lesion. No acute osseous abnormality. No compression deformity.  IMPRESSION: No significant change in bilateral pulmonary nodules or presumed post treatment change without acute abnormality or evidence for intrathoracic recurrence or metastasis.   Electronically Signed   By: Conchita Paris M.D.   On: 08/16/2014 09:48   I have independently reviewed the above radiology studies  and reviewed the findings with the patient.     PET:04/02/2014 CLINICAL DATA: Subsequent treatment strategy for restaging of lung cancer.  EXAM: NUCLEAR MEDICINE PET SKULL BASE TO THIGH  TECHNIQUE: 7.8 mCi F-18 FDG was injected intravenously. Full-ring PET imaging was performed from the skull base to thigh after the radiotracer. CT data was obtained and used for attenuation correction and anatomic localization.  FASTING BLOOD GLUCOSE: Value: 85 mg/dl  COMPARISON: PET of 12/01/2012. Most recent chest CT 12/28/2013.  FINDINGS: NECK  Hypermetabolism which corresponds to a posterior left parotid nodule. This measures 6 mm in a S.U.V. max of 5.9 on image  30. On the prior PET of 12/01/2012, this measured similar in size and was hypermetabolic.  No hypermetabolic cervical nodes.  CHEST  Mild hypermetabolism corresponding to left apical pleural parenchymal thickening. A left lower lobe vague 6 mm nodule corresponds to low level, non malignant range hypermetabolism. This measures a S.U.V. max of 0.8 on image 41. This nodule was similar in size on the prior CT and has been present back to 12/01/2012.  Low right paratracheal node measures 9 mm and a S.U.V. max of 3.4 on image 76. This node measured similar in size back to 12/01/2012.  ABDOMEN/PELVIS  No areas of abnormal hypermetabolism.  SKELETON  Facet arthropathy involving the left upper cervical spine. Concurrent hypermetabolism. No suspicious osseous abnormalities.  CT IMAGES PERFORMED FOR ATTENUATION CORRECTION  Cerebral atrophy. No cervical adenopathy. Presumed sebaceous cyst about the posterior right neck at 5.6 cm.  Mild cardiomegaly. Ascending aorta upper normal in size, 3.8 cm. Similar. Aortic and branch vessel atherosclerosis. Pacer. Centrilobular emphysema. Right lower lobe 7 mm ground-glass nodule on image 48, similar. 4 mm left lower lobe nodule on image 27, similar.  Normal adrenal glands. Bilateral renal cysts. Bilateral nephrolithiasis. 11 mm hyper attenuating left renal lesion on image 135 is unchanged back to 12/01/2012. Cholecystectomy. Right common iliac artery dilatation is similar at 1.6 cm. Mild prostatomegaly. Right hydrocele. Left thoracotomy changes.  IMPRESSION: 1. A right paratracheal node is mildly hypermetabolic, but similar in size back to 2014. Favored to be reactive. 2. Otherwise, no evidence of hypermetabolic recurrent or metastatic disease. 3. Left lower lobe nodule which demonstrates low level, non malignant range hypermetabolism. Given stability back to 2014, likely benign. 4. Incidental findings, including borderline  ascending aortic dilatation, probable left renal complex cyst, and right common iliac artery dilatation. 5. Subtle hypermetabolic nodule in the left parotid gland has been present back to 2014. Although this could represent a primary parotid neoplasm, stability suggests relative indolent behavior. This could be re-evaluated on follow-up or further characterized with contrast and neck CT.   Electronically Signed  By: Abigail Miyamoto M.D.  On: 04/02/2014 12:58 Ct Chest Wo Contrast  12/28/2013   CLINICAL DATA:  Patient with history of lung cancer in 2013. Recurrence in 2014. Cough, mild shortness of breath.  EXAM: CT CHEST WITHOUT CONTRAST  TECHNIQUE: Multidetector CT imaging of the chest was performed following the standard protocol without IV contrast.  COMPARISON:  Chest CT  06/16/2013  FINDINGS: Pacer apparatus within the right anterior hemi thorax. Normal heart size. No pericardial effusion. No mediastinal or hilar adenopathy. Stable prominent sub cm mediastinal lymph nodes. Calcified atherosclerotic plaque involving the thoracic aorta.  Central airways are patent. Extensive diffuse centrilobular emphysema. Stable 8 mm right lower lobe pulmonary nodule. Stable 9 mm right upper lobe ground-glass nodule (image 23; series 4). Stable 5 mm left lower lobe pulmonary nodule (image 31; series 4). Stable left upper lobe suture line.  Incidental imaging of the upper abdomen demonstrate postcholecystectomy changes. Bilateral renal cysts. Normal adrenal glands. No aggressive or acute appearing osseous lesions.  IMPRESSION: Stable CT of the chest.  No change in pulmonary nodules.  Emphysema   Electronically Signed   By: Lovey Newcomer M.D.   On: 12/28/2013 13:18  Ct Chest Wo Contrast  08/28/2013   CLINICAL DATA:  Followup lung cancer  EXAM: CT CHEST WITHOUT CONTRAST  TECHNIQUE: Multidetector CT imaging of the chest was performed following the standard protocol without IV contrast.  COMPARISON:  06/16/2013   FINDINGS: No pleural effusion identified. Moderate to advanced changes of centrilobular emphysema identified. Pulmonary nodule within the right lower lobe is stable measuring 8 mm, image 44/series 5. Pulmonary nodule within the left lower lobe is stable measuring 5 mm, image 36/series 5. Peripheral scar like densities within the left lower lobe appear stable, image 31/series 5. Stable appearance of left upper lobe posterior suture line.  The heart size appears normal. There is no pericardial effusion. No mediastinal or hilar adenopathy. Calcified atherosclerotic disease involves the thoracic aorta.  Incidental imaging through the upper abdomen is significant for bilateral renal cysts. The adrenal glands appear normal. Patient is status post cholecystectomy.  Review of the visualized bony structures is negative for aggressive lytic or sclerotic bone lesion.  IMPRESSION: 1. Stable CT of the chest. 2. No change in bilateral pulmonary nodules. 3. Atherosclerotic disease. 4. Emphysema.   Electronically Signed   By: Kerby Moors M.D.   On: 08/28/2013 14:27   Ct Chest Wo Contrast  06/16/2013   CLINICAL DATA:  Lung cancer. Prior left upper lobe resection. Right lung cancer with radiation. History of prostate cancer.  EXAM: CT CHEST WITHOUT CONTRAST  TECHNIQUE: Multidetector CT imaging of the chest was performed following the standard protocol without IV contrast.  COMPARISON:  02/27/2013  FINDINGS: Severe emphysematous changes in the lungs. Postsurgical changes in the posterior superior left upper lobe. 12 mm nodular area in the left apex, stable. Biapical scarring. Right lower lobe nodule on image 42 measures 8 mm, stable. Subpleural densities noted in the left lateral hemithorax are unchanged. No pleural effusions. No new or enlarging pulmonary nodules. Small nodule in the left lower lobe on image 36 measures 5 mm, stable.  Pacer is noted in place with leads in the right atrium and right ventricle, stable. Heart is  normal size. Ascending aortic borderline in diameter at 3.9 cm. Small scattered mediastinal lymph nodes, none pathologically enlarged or changed since prior study. Chest wall soft tissues are unremarkable.  Large subcutaneous low-density lesion is noted posteriorly in the right posterior shoulder measuring approximately 6.2 cm, stable since PET CT. This presumably represents a large sebaceous cyst.  IMPRESSION: Stable postoperative changes in the posterior superior left upper lobe. Stable bilateral pulmonary nodules. No new or enlarging pulmonary nodules.  Mild slight dilatation of the ascending thoracic aorta measuring 4.0 cm.   Electronically Signed   By: Rolm Baptise M.D.   On: 06/16/2013 13:11  Ct Angio Head W/cm &/or Wo Cm  11/13/2012   *RADIOLOGY REPORT*  Clinical Data:  Numbness all over.  History of bilateral ICA pseudoaneurysms.  Headache.  THE PATIENT WAS PREMEDICATED WITH IV STEROIDS BECAUSE OF THE HISTORY OF CONTRAST ALLERGY.  THERE WERE NO ADVERSE EVENTS.  CT ANGIOGRAPHY HEAD AND NECK  Technique:  Multidetector CT imaging of the head and neck was performed using the standard protocol during bolus administration of intravenous contrast.  Multiplanar CT image reconstructions including MIPs were obtained to evaluate the vascular anatomy. Carotid stenosis measurements (when applicable) are obtained utilizing NASCET criteria, using the distal internal carotid diameter as the denominator.  Contrast: 63m OMNIPAQUE IOHEXOL 350 MG/ML SOLN  Comparison:  08/25/2011 CT angio neck.  10/03/2008 CT angio head.  CTA NECK  Findings:  Right chest cardiac pacemaker.  COPD.  Spiculated left upper lobe lesion is new from priors measuring 11 x 15 mm.  In the setting of chronic of tobacco abuse, concern raised for pulmonary neoplasm.  Previously identified and biopsied, spiculated lesion superior segment left lower lobe lesion, not well seen on today's examination.  Stable subcentimeter thyroid nodules.  Negative larynx.   Advanced spondylosis.  Three-vessel arch configuration without proximal stenosis.  Unremarkable right common carotid artery.  Mild nonstenotic posterior wall plaque.  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  No stenosis.  No significant left or right vertebral artery pathology.  Left vertebral dominant.  Unremarkable left common carotid artery.  Posterior wall plaque is non stenotic in the proximal left internal carotid artery. Rim calcified predominately thrombosed 7 x 7 x 8 mm more inferior cervical ICA aneurysm projecting medially is unchanged.  More superiorly, a  partially thrombosed and calcified skull base cervical ICA aneurysm measuring 8 x 10 x 11 mm is also stable. Lateral thrombus extends cephalad to the level of the horizontal petrous/foramen lacerum level.  No ICA dissection.   Review of the MIP images confirms the above findings.  IMPRESSION:  Stable extracranial atherosclerotic change and stable bilateral cervical ICA pseudoaneurysms.  No evidence for enlargement, the dissection, or neck hemorrhage.  New spiculated left upper lobe 11 x 15 mm lesion; in the setting of COPD cannot exclude squamous cell carcinoma. As the patient has been premedicated, CT chest will be performed shortly with an additional 50 ml of contrast for expeditious  evaluation of the chest and mediastinum given his contrast allergy. Findings discussed with EDP.  CTA HEAD  Findings:  There is no evidence for acute infarction, intracranial hemorrhage, mass lesion, hydrocephalus, or extra-axial fluid.  Mild atrophy.  Mild white matter disease.  Post infusion, no abnormal enhancement.  Intact calvarium.  The  intracranial vasculature is dolichoectatic but patent.  No intracranial dissection or berry aneurysm is present.  The left vertebral is dominant.  There is no proximal vascular occlusion or significant flow  reducing lesion. Similar appearance to priors.   Review of the MIP images confirms the above findings.  IMPRESSION: Unremarkable CTA head.   Original Report Authenticated By: JRolla Flatten M.D.   Ct Angio Neck W/cm &/or Wo/cm  11/13/2012   *RADIOLOGY REPORT*  Clinical Data:  Numbness all over.  History of bilateral ICA pseudoaneurysms.  Headache.  THE PATIENT WAS PREMEDICATED WITH IV STEROIDS BECAUSE OF THE HISTORY OF CONTRAST ALLERGY.  THERE WERE NO ADVERSE EVENTS.  CT ANGIOGRAPHY HEAD AND NECK  Technique:  Multidetector CT imaging of the head and neck was performed using the standard protocol during bolus administration of intravenous contrast.  Multiplanar CT image reconstructions including MIPs were obtained to evaluate the vascular anatomy. Carotid stenosis measurements (when applicable) are obtained utilizing NASCET criteria, using the distal internal carotid diameter as the denominator.  Contrast: 102m OMNIPAQUE IOHEXOL 350 MG/ML SOLN  Comparison:  08/25/2011 CT angio neck.  10/03/2008 CT angio head.  CTA NECK  Findings:  Right chest cardiac pacemaker.  COPD.  Spiculated left upper lobe lesion is new from priors measuring 11 x 15 mm.  In the setting of chronic of tobacco abuse, concern raised for pulmonary neoplasm.  Previously identified and biopsied, spiculated lesion superior segment left lower lobe lesion, not well seen on today's examination.  Stable subcentimeter thyroid nodules.  Negative larynx.  Advanced spondylosis.  Three-vessel arch configuration without proximal stenosis.  Unremarkable right common carotid artery.  Mild nonstenotic posterior wall plaque.  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  No stenosis.  No significant left or right vertebral artery pathology.  Left vertebral dominant.  Unremarkable left common carotid artery.  Posterior wall plaque is non stenotic  in the proximal left internal carotid artery. Rim calcified predominately thrombosed 7 x 7 x 8 mm more inferior cervical ICA aneurysm projecting medially is unchanged.  More superiorly, a  partially thrombosed and calcified skull base cervical ICA aneurysm measuring 8 x 10 x 11 mm is also stable. Lateral thrombus extends cephalad to the level of the horizontal petrous/foramen lacerum level.  No ICA dissection.   Review of the MIP images confirms the above findings.  IMPRESSION:  Stable extracranial atherosclerotic change and stable bilateral cervical ICA pseudoaneurysms.  No evidence for enlargement, the dissection, or neck hemorrhage.  New spiculated left upper lobe 11 x 15 mm lesion; in the setting of COPD cannot exclude squamous cell carcinoma. As the patient has been premedicated, CT chest will be performed shortly with an additional 50 ml of contrast for expeditious  evaluation of the chest and mediastinum given his contrast allergy. Findings discussed with EDP.  CTA HEAD  Findings:  There is no evidence for acute infarction, intracranial hemorrhage, mass lesion, hydrocephalus, or extra-axial fluid.  Mild atrophy.  Mild white matter disease.  Post infusion, no abnormal enhancement.  Intact calvarium.  The  intracranial vasculature is dolichoectatic but patent.  No intracranial dissection or berry aneurysm is present.  The left vertebral is dominant.  There is no proximal vascular occlusion or significant flow reducing lesion. Similar appearance to priors.   Review of the MIP images confirms the above findings.  IMPRESSION: Unremarkable CTA head.   Original Report Authenticated By: JRolla Flatten M.D.   Ct Chest W Contrast  11/22/2012   *RADIOLOGY REPORT*  Clinical Data: Shortness of breath, weakness, abnormality on recently performed neck CTA.  CT CHEST WITH CONTRAST  Technique:  Multidetector CT imaging of the chest was performed following the standard protocol during bolus administration of intravenous  contrast.  Contrast: 551mOMNIPAQUE IOHEXOL 300 MG/ML  SOLN  Comparison: Neck CT head - earlier same day; chest CT - 07/14/2012; 09/03/2011  Findings:  Stable postsurgical change of the superior segment of the left lower lobe.  While the external diameter of the previous identified approximately 1.3 x 1.0 cm nodule within the left lung apex (image 13, series 3) is grossly unchanged,  the nodule appears more consolidated on the present examination, with slight differences possibly attributable to slice selection.  Additional approximately 8 mm ground-glass nodule within the right lower lobe (image 43, series 3) appears grossly unchanged, as does an approximately 5 mm ground-glass nodule within the superior segment of the lingula (image 34, series 3).  No new discrete pulmonary nodules.  Scattered shoddy mediastinal lymph nodes appear grossly unchanged and are individually not enlarged by size criteria with index pretracheal node measuring 9 mm in short axis diameter (image 23, series 2). No definite hilar or axillary lymphadenopathy.  Advanced centrilobular emphysematous change.  Minimal dependent subsegmental atelectasis.  No focal airspace opacity.  No pleural effusion or pneumothorax.  The central pulmonary airways are patent.  Normal heart size.  Right anterior chest wall dual lead pacemaker. No pericardial effusion.  Normal caliber of the main pulmonary artery. Grossly unchanged mild ectasia of the ascending thoracic aorta measuring approximately 4.1 cm in greatest oblique short axis diameter (image 30, series 2).  Conventional configuration of the aortic arch.  Scattered minimal atherosclerotic plaque within the thoracic aorta.  No definite thoracic aortic dissection or periaortic stranding.  Limited evaluation of the upper abdomen demonstrates a partially exophytic approximately 3.0 cm cyst arising from the posterior superior aspect of the right kidney.  Several additional smaller renal cysts are noted  bilaterally.  Post cholecystectomy.  No acute or aggressive osseous abnormalities.  IMPRESSION:  1.  No change to minimal increase in the indeterminate approximately 1.3 cm nodule within the left lung apex, with slight differences in possibly attributable to slice selection.  While possibly representing of scar, given advanced emphysematous change, a slowly growing lung cancer is not excluded.  As such, further evaluation with a follow-up chest CT in 3 months is recommended.  2.  Grossly unchanged bilateral sub centimeter ground-glass nodules, likely too small to accurately characterize.  Continued attention on follow-up is recommended.  3.  Stable postsurgical change of the superior segment of the left lower lobe.  4.  Grossly unchanged mild fusiform ectasia of the descending thoracic aorta measuring approximately 41 mm in diameter.  Above findings discussed with Dr. Zenia Resides at the time of examination completion.   Original Report Authenticated By: Jake Seats, MD   Ct Chest Wo Contrast  07/14/2012  *RADIOLOGY REPORT*  Clinical Data: Follow-up lung cancer.  CT CHEST WITHOUT CONTRAST  Technique:  Multidetector CT imaging of the chest was performed following the standard protocol without IV contrast.  Comparison: CT scan 08/18/2011.  Findings: The chest wall is stable.  A permanent right-sided pacemaker is noted.  No supraclavicular or axillary lymphadenopathy.  The bony thorax is intact.  No destructive bone lesions or spinal canal compromise.  Moderate degenerative changes involving the spine.  The heart is normal in size.  No pericardial effusion.  No mediastinal or hilar lymphadenopathy.  Stable tortuosity and ectasia of the thoracic aorta.  The esophagus is grossly normal.  Examination of the lung parenchyma demonstrates stable advanced emphysematous changes and pulmonary interstitial scarring.  The left upper lobe pulmonary nodule has been excised.  No findings for recurrent or residual tumor.  There is a new  left apical lung density.  This could reflect progressive scarring change but does need observation.  There is a 7 mm nodule at the right lung base.  This is difficult to see on the prior CT but was a on the prior PET CT and needs continued observation.  There is an ill-defined nodular  density measuring approximate 5 mm in the superior segment of the left lower lobe on image number 31. I believe this was present on the prior CT scan although it is more obvious now.  A 4 mm nodule is noted on image number 22 in the left upper lobe. This was present on the prior study and has not significantly changed.  No acute pulmonary findings or pleural effusion.  The upper abdomen is unremarkable.  Stable right renal cyst.  IMPRESSION:  1.  Surgical changes from prior excision of a left apical lung neoplasm. 2.  Advanced emphysematous changes and pulmonary scarring. 3.  New 13 x 8 mm left apical lung density.  This could reflect scarring change but needs close observation. 4.  Other small pulmonary nodules as discussed above.  Recommend continued observation. 5.  No acute pulmonary findings and no mediastinal or hilar lymphadenopathy. 6.  Stable tortuosity and ectasia of the thoracic aorta.   Original Report Authenticated By: Marijo Sanes, M.D.       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 Completed 01/03/13  EGFR positive Several ground-glass nodules and several small left-sided solid nodules are present in the lungs. These are stable from 08/16/2014 but for the most part have enlarged compared to 08/18/11.  The patient continues to have multiple small bilateral nodules some which have changed slightly and others that are unchanged. With his previous history of 2 separate lung malignancies in in the left lung In the suspicious nature of the current lesions we will obtain a repeat PET scan  The patient will talk to his primary care about further evaluation for weight loss.  I plan to see him back in 7-14 days depending on when the PET scan is completed.  Grace Isaac 08/15/2015 11:13 AM

## 2015-08-21 ENCOUNTER — Other Ambulatory Visit: Payer: Self-pay | Admitting: Cardiothoracic Surgery

## 2015-08-21 ENCOUNTER — Ambulatory Visit (HOSPITAL_COMMUNITY)
Admission: RE | Admit: 2015-08-21 | Discharge: 2015-08-21 | Disposition: A | Discharge status: Home / Self Care | Payer: Medicare Other | Source: Ambulatory Visit | Attending: Cardiothoracic Surgery | Admitting: Cardiothoracic Surgery

## 2015-08-21 DIAGNOSIS — R918 Other nonspecific abnormal finding of lung field: Secondary | ICD-10-CM

## 2015-08-21 DIAGNOSIS — N201 Calculus of ureter: Secondary | ICD-10-CM | POA: Diagnosis not present

## 2015-08-21 DIAGNOSIS — N400 Enlarged prostate without lower urinary tract symptoms: Secondary | ICD-10-CM | POA: Insufficient documentation

## 2015-08-21 DIAGNOSIS — R590 Localized enlarged lymph nodes: Secondary | ICD-10-CM | POA: Insufficient documentation

## 2015-08-21 DIAGNOSIS — R911 Solitary pulmonary nodule: Secondary | ICD-10-CM | POA: Insufficient documentation

## 2015-08-21 DIAGNOSIS — K118 Other diseases of salivary glands: Secondary | ICD-10-CM | POA: Diagnosis not present

## 2015-08-21 LAB — GLUCOSE, CAPILLARY: Glucose-Capillary: 103 mg/dL — ABNORMAL HIGH (ref 65–99)

## 2015-08-21 MED ORDER — FLUDEOXYGLUCOSE F - 18 (FDG) INJECTION
6.5000 | Freq: Once | INTRAVENOUS | Status: AC | PRN
  Administered 2015-08-21: 6.5 via INTRAVENOUS

## 2015-08-28 ENCOUNTER — Encounter: Payer: Medicare Other | Admitting: Cardiothoracic Surgery

## 2015-08-29 ENCOUNTER — Other Ambulatory Visit: Payer: Self-pay | Admitting: *Deleted

## 2015-08-29 ENCOUNTER — Ambulatory Visit (INDEPENDENT_AMBULATORY_CARE_PROVIDER_SITE_OTHER): Payer: Medicare Other | Admitting: Cardiothoracic Surgery

## 2015-08-29 ENCOUNTER — Encounter: Payer: Self-pay | Admitting: Cardiothoracic Surgery

## 2015-08-29 VITALS — BP 127/76 | HR 106 | Resp 16 | Ht 71.0 in | Wt 133.0 lb

## 2015-08-29 DIAGNOSIS — C3412 Malignant neoplasm of upper lobe, left bronchus or lung: Secondary | ICD-10-CM | POA: Diagnosis not present

## 2015-08-29 DIAGNOSIS — Z902 Acquired absence of lung [part of]: Secondary | ICD-10-CM

## 2015-08-29 DIAGNOSIS — R918 Other nonspecific abnormal finding of lung field: Secondary | ICD-10-CM

## 2015-08-29 NOTE — Progress Notes (Signed)
[address]       [name] [address]             [phone]                         [name] [place] Medical Record #[id number] Date of Birth: Jan 18, 1933  [name], * [name] Downing, MD  Chief Complaint:   PostOp Follow Up Visit 10/20/2011  DATE OF DISCHARGE:  OPERATIVE REPORT  Preop: Non small cell Lung cancer- left  Postop: same  SURGICAL PROCEDURE: Repeat left Mini thoracotomy, wedge resection, and  segmentectomy of the superior segment of the left lower lobe, and  placement of On-Q device.    pT1b, pNX, MX. INVASIVE MODERATELY DIFFERENTIATED ADENOCARCINOMA, SPANNING 2.2 CM IN GREATEST DIMENSION.   Wedge resection 10/20/2011  Stereotactic radio therapy to left upper lobe lesion in October 2014  Needle BX of left upper lobe lesion: [id number]  ALK negative, EGFR  positive Lung, needle/core biopsy(ies), LUL - POSITIVE FOR ADENOCARCINOMA.  History of Present Illness:     Patient returns to the office today in follow-up followup after surgical wedge resection of a T1 B. moderately differentiated adenocarcinoma the lung 2 cm in size located in the superior segment of the left lower lobe 10/20/2011. The patient previously had full left thoracotomy many years ago for "cyst".    An separate  enlarging lesion in the left upper lobe was biopsy proven adenocarcinoma and he underwent stereotactic radiotherapy in the fall of 2014  The patient comes in today with continued with follow-up PET scan of the chest. He notes that over the past 2-3 months he's had additional weight loss thinks up to 13 pounds. He's had no fever chills. No night sweats He says he has no appetite. His daughter notes that he continues to drink beer on a daily basis and continues smoking.    History  Smoking status  . Current Some Day Smoker -- 1.00 packs/day for 50 years  . Types: Cigarettes  . Last Attempt to Quit: 03/23/2004  Smokeless tobacco  .  Current User  . Types: Chew       Allergies  Allergen Reactions  . Iohexol      Desc: PT STATED TODAY HE GETS SLIGHT SOB FROM IV CONTRAST. HE NEEDS FULL PREMEDS FROM NOW ON PER DR. MATTERN   . Penicillins Itching and Rash    "haven't taken any  in 40 years" Has patient had a PCN reaction causing immediate rash, facial/tongue/throat swelling, SOB or lightheadedness with hypotension: NO Has patient had a PCN reaction causing severe rash involving mucus membranes or skin necrosis:NO Has patient had a PCN reaction that required hospitalization NO Has patient had a PCN reaction occurring within the last 10 years: NO If all of the above answers are "NO", then may proceed with Cephalosporin use.    Current Outpatient Prescriptions  Medication Sig Dispense Refill  . albuterol (PROVENTIL HFA;VENTOLIN HFA) 108 (90 BASE) MCG/ACT inhaler Inhale 2 puffs into the lungs every 6 (six) hours as needed for wheezing or shortness of breath.    . metoprolol tartrate (LOPRESSOR) 25 MG tablet TAKE ONE BY MOUTH TWO TIMES A DAY. 60 tablet 10  . nitroGLYCERIN (NITROSTAT) 0.4 MG SL tablet Place 1 tablet (0.4 mg total) under the tongue every 5 (five) minutes as needed. For chest pain 30 tablet 3  . rOPINIRole (REQUIP) 0.5 MG tablet Take 1  tablet by mouth at bedtime as needed. For RLS    . warfarin (COUMADIN) 5 MG tablet Take 5-7.5 mg by mouth See admin instructions. Take as directed by the coumadin clinc. Take 1 tablet ('5mg'$ ) for 3 days, then on the 4th day takes 1.5 tablets (7.'5mg'$ ), then start over. (does not take 7.'5mg'$  on a specific day of the week)    . zolpidem (AMBIEN) 5 MG tablet Take 5 mg by mouth at bedtime as needed for sleep.     No current facility-administered medications for this visit.    Wt Readings from Last 3 Encounters:  08/29/15 133 lb (60.328 kg)  08/15/15 133 lb 8 oz (60.555 kg)  04/29/15 161 lb 12.8 oz (73.392 kg)     Physical Exam: BP 127/76 mmHg  Pulse 106  Resp 16  Ht '5\' 11"'$   (1.803 m)  Wt 133 lb (60.328 kg)  BMI 18.56 kg/m2  SpO2 98% Patient in sinus rhythm on palpation today General appearance: alert and cooperative Neurologic: intact Heart: regular rate and rhythm, S1, S2 normal, no murmur, click, rub or gallop and normal apical impulse Lungs: clear to auscultation bilaterally and normal percussion bilaterally Abdomen: soft, non-tender; bowel sounds normal; no masses,  no organomegaly Extremities: extremities normal, atraumatic, no cyanosis or edema and Homans sign is negative, no sign of DVT Wounds:well healed.  No cervical or supraclavicular or axillary adenopathy   Diagnostic Studies & Laboratory data:         Recent Radiology Findings: Ct Chest Wo Contrast  08/15/2015  ADDENDUM REPORT: 08/15/2015 12:07 IMPRESSION: 1. Within the superior segment of the right lower lobe there is an enlarging sub solid nodule. This has an equivalent diameter of 1.8 cm. Previously 1.5 cm. This is suspicious for a low-grade indolent neoplasm such as pulmonary adenocarcinoma. 2. Interval development of multiple thin walled cavitary nodules in both lungs. Although nonspecific the appearance is suggestive of pulmonary Langerhans Cell Histiocytosis. Less favored diagnostic considerations include (but are not limited to) multiple septic emboli or of metastatic squamous cell carcinoma. 3. Emphysema 4. Aortic atherosclerosis and LAD coronary artery calcification. Electronically Signed   By: Kerby Moors M.D.   On: 08/15/2015 12:07  08/15/2015  CLINICAL DATA:  History of left lung cancer in 2013.  Former smoker. EXAM: CT CHEST WITHOUT CONTRAST TECHNIQUE: Multidetector CT imaging of the chest was performed following the standard protocol without IV contrast. COMPARISON:  02/07/2015 FINDINGS: Mediastinum: The heart size appears normal. Aortic atherosclerosis noted. Calcification involving the LAD coronary artery noted. There is no mediastinal or hilar adenopathy. There is no axillary or  supraclavicular adenopathy. Lungs/Pleura: No pleural fluid identified. Moderate to advanced changes of centrilobular and paraseptal emphysema. The left upper lobe pulmonary nodule is unchanged measuring 4 mm, image 43 of series 4. Sub solid nodule within the posterior right upper lobe measures 1.7 x 1.9 cm and has an equivalent diameter of 1.8 cm, image number 53 of series 4. On the previous exam this had an equivalent diameter of 1.5 cm. In the left lower lobe there is a 8 x 7 mm nodule (8 mm mean diameter) on image 63 of series 4. Previously this had an equivalent diameter of 7 mm. In the right lower low there is a 9 x 7 mm nodule (8 mm mean diameter) on image 85 of series 4. This is unchanged from previous exam. Multiple new cavitary nodules are scattered throughout both lungs. For example, in the right lower lobe new cavitary nodule measures 7 by  6 mm, mean diameter 7 mm, image 66 of series 4. In the left lower lobe posteriorly there is a new cavitary 6 x 6 mm nodule (6 mm mean diameter) on image 78 of series 4. In the right lower lobe there is a new cavitary nodule measuring 4 x 5 mm nodule (5 mm mean diameter) on image 87 of series 4. Upper Abdomen: There is no acute findings identified within the upper abdomen. The adrenal glands are normal. There are bilateral renal cysts of varying density. These are incompletely characterized without IV contrast. Small nonobstructing calculi are noted within the upper pole of the left kidney. Musculoskeletal: No aggressive lytic or sclerotic bone lesions identified. Degenerative disc disease noted within the thoracic spine. Electronically Signed: By: Kerby Moors M.D. On: 08/15/2015 11:04   Nm Pet Image Restag (ps) Skull Base To Thigh  08/21/2015  CLINICAL DATA:  Subsequent Treatment strategy for history lung cancer. Right lower lobe sub solid pulmonary nodule. Multiple thin-walled cavitary nodules. EXAM: NUCLEAR MEDICINE PET SKULL BASE TO THIGH TECHNIQUE: 6.5 mCi  F-18 FDG was injected intravenously. Full-ring PET imaging was performed from the skull base to thigh after the radiotracer. CT data was obtained and used for attenuation correction and anatomic localization. FASTING BLOOD GLUCOSE:  Value: 103 mg/dl COMPARISON:  Chest CTs including 08/15/2015.  PET of 04/02/2014 FINDINGS: NECK Left-sided parotid hypermetabolic nodule again identified. This measures 6 mm and a S.U.V. max of 4.2 today. Compare 6 mm and a S.U.V. max of 5.9 on the prior. No cervical nodal hypermetabolism CHEST Low right paratracheal node which measures 9 mm and a S.U.V. max of 4.8 today on image 72/series 4. Compare 9 mm and a S.U.V. max of 3.4 previously. More cephalad right paratracheal node measures 8 mm and a S.U.V. max of 3.3 on image 62/series 4. This node measured similar in size and was not hypermetabolic on the prior exam. Right hilar hypermetabolism is new, including at a S.U.V. max of 4.7. No well-defined adenopathy in this area. The sub solid superior segment right lower lobe pulmonary nodule described on the prior exam measures on the order of 1.6 cm and a S.U.V. max of 1.1 on image 32/series 8. A focus of hypermetabolism about the anterior left pleural space is without well-defined pleural mass. This measures a S.U.V. max of 2.4 on image 86/series 4 and is new. Pleural-parenchymal hypermetabolism at the left apex is favored to be due to scarring, relatively mild. 9 mm left lower lobe pulmonary nodule measures a S.U.V. max of 2.4 on image 38/series 8. This nodule measured 6 mm and was not significantly hypermetabolic on the prior PET (below PET resolution). ABDOMEN/PELVIS No areas of abnormal hypermetabolism. SKELETON No abnormal marrow activity. CT IMAGES PERFORMED FOR ATTENUATION CORRECTION Left carotid atherosclerosis. No cervical adenopathy. Chest findings deferred to recent diagnostic CT. Ascending aortic dilatation again identified including at 4.1 cm. Advanced centrilobular  emphysema. Other scattered pulmonary lesions, including cavitary nodules, are below PET resolution. Normal adrenal glands. Bilateral fluid density renal lesions are likely cysts. There is minimal complexity associated with a partially calcified left renal lesion, similar. Cholecystectomy. Mild prostatomegaly. Bladder wall thickening. Although there is no significant hydronephrosis, a stone is identified at the proximal left ureter measuring 8 mm on image 135/series 4. Smaller stones are seen in the left renal collecting system. Subtle hyper attenuation is identified at the left renal pelvis on image 130/series 4. remote left rib trauma or surgery. IMPRESSION: 1. The superior segment right lower lobe  sub solid pulmonary nodule is not significantly hypermetabolic. As low-grade adenocarcinoma is typically not hypermetabolic, this remains a concern. 2. Increase in low-level hypermetabolism within non pathologically enlarged thoracic nodes. These could be reactive or related to metastatic disease. This could be re-evaluated at followup or 1 or more these nodes could be sampled. 3. A left lower lobe pulmonary nodule has enlarged since the prior PET and demonstrates hypermetabolism. Suspicious for metastatic or metachronous carcinoma. 4. No evidence of extrathoracic metastatic disease. 5. Left-sided stone currently in the proximal left ureter without significant proximal obstruction. Suspect hemorrhage in the left renal pelvis, incompletely evaluated. Depending on clinical concern, dedicated hematuria protocol CT could be performed for further delineation. 6. Prostatomegaly with suspicion of secondary bladder wall thickening/out obstruction. 7. Left parotid nodule is unchanged and likely an indolent primary neoplasm. Electronically Signed   By: Abigail Miyamoto M.D.   On: 08/21/2015 16:51      Ct Chest Wo Contrast  02/07/2015  CLINICAL DATA:  Left lung cancer 2013, status post surgery and radiation. Prostate cancer  2008. EXAM: CT CHEST WITHOUT CONTRAST TECHNIQUE: Multidetector CT imaging of the chest was performed following the standard protocol without IV contrast. COMPARISON:  08/16/2014 FINDINGS: Mediastinum/Nodes: Upper right paratracheal node 8 mm in short axis on image 20 series 3, stable by my measurement. No pathologic adenopathy is observed. Atherosclerotic aortic arch. Ascending thoracic aorta 4.3 cm diameter. The dual lead pacer noted. Lungs/Pleura: Severe emphysema. Architectural distortion and density at the left lung apex is increased from the prior exam, image 14 series 4 compared image 15 of series 4 of the prior exam. However, the increased density is disorganized, irregular, surrounds varies bulla, and accordingly is difficult to measure. A lot of this increase in density could be inflammatory. Right lower lobe 0.9 by 0.7 cm ground-glass opacity nodule, stable by my measurements from 08/16/2014, but slowly increasing from 08/18/2011. Superior segment right lower lobe sub solid nodule 1.3 by 1.7 cm on image 27 series 4, essentially stable from 08/16/2014 but increased from 08/18/2011. 0.6 by 0.4 cm super segment left lower lobe nodule on image 24 series 4, previously 0.4 by 0.4 cm on 08/16/2014 and perhaps 2 mm in diameter on 08/18/2011. Left lower lobe solid nodule 0.7 by 0.5 cm on image 35 series 4, stable by my measurements on 08/16/2014 and previously measuring 0.5 by 0.4 cm on 08/18/2011. Upper abdomen: Cholecystectomy. Hypodense lesions of both renal upper poles favoring cysts given the photopenia on PET-CT. Musculoskeletal: Thoracic spondylosis. IMPRESSION: 1. Increase in architectural distortion and primarily interstitial density at the left lung apex, possibly from radiation therapy or inflammatory process, less likely to be locally recurrent malignancy. Merits observation. 2. Several ground-glass nodules and several small left-sided solid nodules are present in the lungs. These are stable from  08/16/2014 but for the most part have enlarged compared to 08/18/11. Multi focal low grade adenocarcinoma is certainly not excluded based on these observations. These have not been appreciably hypermetabolic on PET-CT. 3. Aneurysmal ascending aorta. Recommend annual imaging followup by CTA or MRA. This recommendation follows 2010 ACCF/AHA/AATS/ACR/ASA/SCA/SCAI/SIR/STS/SVM Guidelines for the Diagnosis and Management of Patients with Thoracic Aortic Disease. Circulation. 2010; 121: C623-J628 4. Severe emphysema. Electronically Signed   By: Van Clines M.D.   On: 02/07/2015 10:41    Ct Chest Wo Contrast  08/16/2014   CLINICAL DATA:  Left lower lobe lung cancer restaging  EXAM: CT CHEST WITHOUT CONTRAST  TECHNIQUE: Multidetector CT imaging of the chest was performed following the  standard protocol without IV contrast.  COMPARISON:  05/18/2014 CT abdomen/ pelvis, most recent chest CT 12/28/2013  FINDINGS: Mediastinum/Nodes: Right-sided pacer in place. Ascending aortic ectasia measuring 4.2 cm image 33 again noted.  Heart size is normal. High pretracheal lymph node measuring 0.7 cm stable. No new hilar or mediastinal lymphadenopathy. No pericardial effusion.  Lungs/Pleura: Emphysematous changes are noted. Nodular ground-glass opacity right lower lobe measuring 0.8 cm image 47 is stable. 3 mm left upper lobe pulmonary nodule image 26 is stable. Subpleural nodularity predominantly in the left upper and lower lobes is subjectively stable. Stable left lower lobe 0.5 cm nodule image 38. Left upper lobe/perihilar linear presumed radiation fibrosis/scarring is stable. No pleural effusion.  Upper abdomen: Bilateral renal cortical cysts are again noted, partly visualized.  Musculoskeletal: No lytic or sclerotic osseous lesion. No acute osseous abnormality. No compression deformity.  IMPRESSION: No significant change in bilateral pulmonary nodules or presumed post treatment change without acute abnormality or evidence for  intrathoracic recurrence or metastasis.   Electronically Signed   By: Christiana Pellant M.D.   On: 08/16/2014 09:48   I have independently reviewed the above radiology studies  and reviewed the findings with the patient.     PET:04/02/2014 CLINICAL DATA: Subsequent treatment strategy for restaging of lung cancer.  EXAM: NUCLEAR MEDICINE PET SKULL BASE TO THIGH  TECHNIQUE: 7.8 mCi F-18 FDG was injected intravenously. Full-ring PET imaging was performed from the skull base to thigh after the radiotracer. CT data was obtained and used for attenuation correction and anatomic localization.  FASTING BLOOD GLUCOSE: Value: 85 mg/dl  COMPARISON: PET of 59/51/0918. Most recent chest CT 12/28/2013.  FINDINGS: NECK  Hypermetabolism which corresponds to a posterior left parotid nodule. This measures 6 mm in a S.U.V. max of 5.9 on image 30. On the prior PET of 12/01/2012, this measured similar in size and was hypermetabolic.  No hypermetabolic cervical nodes.  CHEST  Mild hypermetabolism corresponding to left apical pleural parenchymal thickening. A left lower lobe vague 6 mm nodule corresponds to low level, non malignant range hypermetabolism. This measures a S.U.V. max of 0.8 on image 41. This nodule was similar in size on the prior CT and has been present back to 12/01/2012.  Low right paratracheal node measures 9 mm and a S.U.V. max of 3.4 on image 76. This node measured similar in size back to 12/01/2012.  ABDOMEN/PELVIS  No areas of abnormal hypermetabolism.  SKELETON  Facet arthropathy involving the left upper cervical spine. Concurrent hypermetabolism. No suspicious osseous abnormalities.  CT IMAGES PERFORMED FOR ATTENUATION CORRECTION  Cerebral atrophy. No cervical adenopathy. Presumed sebaceous cyst about the posterior right neck at 5.6 cm.  Mild cardiomegaly. Ascending aorta upper normal in size, 3.8 cm. Similar. Aortic and branch vessel  atherosclerosis. Pacer. Centrilobular emphysema. Right lower lobe 7 mm ground-glass nodule on image 48, similar. 4 mm left lower lobe nodule on image 27, similar.  Normal adrenal glands. Bilateral renal cysts. Bilateral nephrolithiasis. 11 mm hyper attenuating left renal lesion on image 135 is unchanged back to 12/01/2012. Cholecystectomy. Right common iliac artery dilatation is similar at 1.6 cm. Mild prostatomegaly. Right hydrocele. Left thoracotomy changes.  IMPRESSION: 1. A right paratracheal node is mildly hypermetabolic, but similar in size back to 2014. Favored to be reactive. 2. Otherwise, no evidence of hypermetabolic recurrent or metastatic disease. 3. Left lower lobe nodule which demonstrates low level, non malignant range hypermetabolism. Given stability back to 2014, likely benign. 4. Incidental findings, including borderline ascending aortic dilatation,  probable left renal complex cyst, and right common iliac artery dilatation. 5. Subtle hypermetabolic nodule in the left parotid gland has been present back to 2014. Although this could represent a primary parotid neoplasm, stability suggests relative indolent behavior. This could be re-evaluated on follow-up or further characterized with contrast and neck CT.   Electronically Signed  By: Jeronimo Greaves M.D.  On: 04/02/2014 12:58 Ct Chest Wo Contrast  12/28/2013   CLINICAL DATA:  Patient with history of lung cancer in 2013. Recurrence in 2014. Cough, mild shortness of breath.  EXAM: CT CHEST WITHOUT CONTRAST  TECHNIQUE: Multidetector CT imaging of the chest was performed following the standard protocol without IV contrast.  COMPARISON:  Chest CT 06/16/2013  FINDINGS: Pacer apparatus within the right anterior hemi thorax. Normal heart size. No pericardial effusion. No mediastinal or hilar adenopathy. Stable prominent sub cm mediastinal lymph nodes. Calcified atherosclerotic plaque involving the thoracic aorta.  Central  airways are patent. Extensive diffuse centrilobular emphysema. Stable 8 mm right lower lobe pulmonary nodule. Stable 9 mm right upper lobe ground-glass nodule (image 23; series 4). Stable 5 mm left lower lobe pulmonary nodule (image 31; series 4). Stable left upper lobe suture line.  Incidental imaging of the upper abdomen demonstrate postcholecystectomy changes. Bilateral renal cysts. Normal adrenal glands. No aggressive or acute appearing osseous lesions.  IMPRESSION: Stable CT of the chest.  No change in pulmonary nodules.  Emphysema   Electronically Signed   By: Annia Belt M.D.   On: 12/28/2013 13:18  Ct Chest Wo Contrast  08/28/2013   CLINICAL DATA:  Followup lung cancer  EXAM: CT CHEST WITHOUT CONTRAST  TECHNIQUE: Multidetector CT imaging of the chest was performed following the standard protocol without IV contrast.  COMPARISON:  06/16/2013  FINDINGS: No pleural effusion identified. Moderate to advanced changes of centrilobular emphysema identified. Pulmonary nodule within the right lower lobe is stable measuring 8 mm, image 44/series 5. Pulmonary nodule within the left lower lobe is stable measuring 5 mm, image 36/series 5. Peripheral scar like densities within the left lower lobe appear stable, image 31/series 5. Stable appearance of left upper lobe posterior suture line.  The heart size appears normal. There is no pericardial effusion. No mediastinal or hilar adenopathy. Calcified atherosclerotic disease involves the thoracic aorta.  Incidental imaging through the upper abdomen is significant for bilateral renal cysts. The adrenal glands appear normal. Patient is status post cholecystectomy.  Review of the visualized bony structures is negative for aggressive lytic or sclerotic bone lesion.  IMPRESSION: 1. Stable CT of the chest. 2. No change in bilateral pulmonary nodules. 3. Atherosclerotic disease. 4. Emphysema.   Electronically Signed   By: Signa Kell M.D.   On: 08/28/2013 14:27   Ct Chest Wo  Contrast  06/16/2013   CLINICAL DATA:  Lung cancer. Prior left upper lobe resection. Right lung cancer with radiation. History of prostate cancer.  EXAM: CT CHEST WITHOUT CONTRAST  TECHNIQUE: Multidetector CT imaging of the chest was performed following the standard protocol without IV contrast.  COMPARISON:  02/27/2013  FINDINGS: Severe emphysematous changes in the lungs. Postsurgical changes in the posterior superior left upper lobe. 12 mm nodular area in the left apex, stable. Biapical scarring. Right lower lobe nodule on image 42 measures 8 mm, stable. Subpleural densities noted in the left lateral hemithorax are unchanged. No pleural effusions. No new or enlarging pulmonary nodules. Small nodule in the left lower lobe on image 36 measures 5 mm, stable.  Pacer is noted in  place with leads in the right atrium and right ventricle, stable. Heart is normal size. Ascending aortic borderline in diameter at 3.9 cm. Small scattered mediastinal lymph nodes, none pathologically enlarged or changed since prior study. Chest wall soft tissues are unremarkable.  Large subcutaneous low-density lesion is noted posteriorly in the right posterior shoulder measuring approximately 6.2 cm, stable since PET CT. This presumably represents a large sebaceous cyst.  IMPRESSION: Stable postoperative changes in the posterior superior left upper lobe. Stable bilateral pulmonary nodules. No new or enlarging pulmonary nodules.  Mild slight dilatation of the ascending thoracic aorta measuring 4.0 cm.   Electronically Signed   By: Rolm Baptise M.D.   On: 06/16/2013 13:11  Ct Angio Head W/cm &/or Wo Cm  11/13/2012   *RADIOLOGY REPORT*  Clinical Data:  Numbness all over.  History of bilateral ICA pseudoaneurysms.  Headache.  THE PATIENT WAS PREMEDICATED WITH IV STEROIDS BECAUSE OF THE HISTORY OF CONTRAST ALLERGY.  THERE WERE NO ADVERSE EVENTS.  CT ANGIOGRAPHY HEAD AND NECK  Technique:  Multidetector CT imaging of the head and neck was  performed using the standard protocol during bolus administration of intravenous contrast.  Multiplanar CT image reconstructions including MIPs were obtained to evaluate the vascular anatomy. Carotid stenosis measurements (when applicable) are obtained utilizing NASCET criteria, using the distal internal carotid diameter as the denominator.  Contrast: 31m OMNIPAQUE IOHEXOL 350 MG/ML SOLN  Comparison:  08/25/2011 CT angio neck.  10/03/2008 CT angio head.  CTA NECK  Findings:  Right chest cardiac pacemaker.  COPD.  Spiculated left upper lobe lesion is new from priors measuring 11 x 15 mm.  In the setting of chronic of tobacco abuse, concern raised for pulmonary neoplasm.  Previously identified and biopsied, spiculated lesion superior segment left lower lobe lesion, not well seen on today's examination.  Stable subcentimeter thyroid nodules.  Negative larynx.  Advanced spondylosis.  Three-vessel arch configuration without proximal stenosis.  Unremarkable right common carotid artery.  Mild nonstenotic posterior wall plaque.  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  No stenosis.  No significant left or right vertebral artery pathology.  Left vertebral dominant.  Unremarkable left common carotid artery.  Posterior wall plaque is non stenotic in the proximal left internal carotid artery. Rim calcified predominately thrombosed 7 x 7 x 8 mm more inferior cervical ICA aneurysm projecting medially is unchanged.  More superiorly, a  partially thrombosed and calcified skull base cervical ICA aneurysm measuring 8 x 10 x 11 mm is also stable. Lateral thrombus extends cephalad to the level of the horizontal petrous/foramen lacerum level.  No ICA dissection.   Review of the MIP images confirms the above findings.  IMPRESSION:  Stable extracranial atherosclerotic change and stable bilateral cervical ICA  pseudoaneurysms.  No evidence for enlargement, the dissection, or neck hemorrhage.  New spiculated left upper lobe 11 x 15 mm lesion; in the setting of COPD cannot exclude squamous cell carcinoma. As the patient has been premedicated, CT chest will be performed shortly with an additional 50 ml of contrast for expeditious  evaluation of the chest and mediastinum given his contrast allergy. Findings discussed with EDP.  CTA HEAD  Findings:  There is no evidence for acute infarction, intracranial hemorrhage, mass lesion, hydrocephalus, or extra-axial fluid.  Mild atrophy.  Mild white matter disease.  Post infusion, no abnormal enhancement.  Intact calvarium.  The  intracranial vasculature is dolichoectatic but patent.  No intracranial dissection or berry aneurysm is present.  The left vertebral is dominant.  There is no proximal vascular occlusion or significant flow reducing lesion. Similar appearance to priors.   Review of the MIP images confirms the above findings.  IMPRESSION: Unremarkable CTA head.   Original Report Authenticated By: Davonna Belling, M.D.   Ct Angio Neck W/cm &/or Wo/cm  11/13/2012   *RADIOLOGY REPORT*  Clinical Data:  Numbness all over.  History of bilateral ICA pseudoaneurysms.  Headache.  THE PATIENT WAS PREMEDICATED WITH IV STEROIDS BECAUSE OF THE HISTORY OF CONTRAST ALLERGY.  THERE WERE NO ADVERSE EVENTS.  CT ANGIOGRAPHY HEAD AND NECK  Technique:  Multidetector CT imaging of the head and neck was performed using the standard protocol during bolus administration of intravenous contrast.  Multiplanar CT image reconstructions including MIPs were obtained to evaluate the vascular anatomy. Carotid stenosis measurements (when applicable) are obtained utilizing NASCET criteria, using the distal internal carotid diameter as the denominator.  Contrast: 54mL OMNIPAQUE IOHEXOL 350 MG/ML SOLN  Comparison:  08/25/2011 CT angio neck.  10/03/2008 CT angio head.  CTA NECK  Findings:  Right chest cardiac  pacemaker.  COPD.  Spiculated left upper lobe lesion is new from priors measuring 11 x 15 mm.  In the setting of chronic of tobacco abuse, concern raised for pulmonary neoplasm.  Previously identified and biopsied, spiculated lesion superior segment left lower lobe lesion, not well seen on today's examination.  Stable subcentimeter thyroid nodules.  Negative larynx.  Advanced spondylosis.  Three-vessel arch configuration without proximal stenosis.  Unremarkable right common carotid artery.  Mild nonstenotic posterior wall plaque.  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  No stenosis.  No significant left or right vertebral artery pathology.  Left vertebral dominant.  Unremarkable left common carotid artery.  Posterior wall plaque is non stenotic in the proximal left internal carotid artery. Rim calcified predominately thrombosed 7 x 7 x 8 mm more inferior cervical ICA aneurysm projecting medially is unchanged.  More superiorly, a  partially thrombosed and calcified skull base cervical ICA aneurysm measuring 8 x 10 x 11 mm is also stable. Lateral thrombus extends cephalad to the level of the horizontal petrous/foramen lacerum level.  No ICA dissection.   Review of the MIP images confirms the above findings.  IMPRESSION:  Stable extracranial atherosclerotic change and stable bilateral cervical ICA pseudoaneurysms.  No evidence for enlargement, the dissection, or neck hemorrhage.  New spiculated left upper lobe 11 x 15 mm lesion; in the setting of COPD cannot exclude squamous cell carcinoma. As the patient has been premedicated, CT chest will be performed shortly with an additional 50 ml of contrast for expeditious  evaluation of the chest and mediastinum given his contrast allergy. Findings discussed with EDP.  CTA HEAD  Findings:  There is no evidence for acute infarction, intracranial  hemorrhage, mass lesion, hydrocephalus, or extra-axial fluid.  Mild atrophy.  Mild white matter disease.  Post infusion, no abnormal enhancement.  Intact calvarium.  The  intracranial vasculature is dolichoectatic but patent.  No intracranial dissection or berry aneurysm is present.  The left vertebral is dominant.  There is no proximal vascular occlusion or significant flow reducing lesion. Similar appearance to priors.   Review of the MIP images confirms the above findings.  IMPRESSION: Unremarkable CTA head.   Original Report  Authenticated By: Rolla Flatten, M.D.   Ct Chest W Contrast  11/22/2012   *RADIOLOGY REPORT*  Clinical Data: Shortness of breath, weakness, abnormality on recently performed neck CTA.  CT CHEST WITH CONTRAST  Technique:  Multidetector CT imaging of the chest was performed following the standard protocol during bolus administration of intravenous contrast.  Contrast: 1m OMNIPAQUE IOHEXOL 300 MG/ML  SOLN  Comparison: Neck CT head - earlier same day; chest CT - 07/14/2012; 09/03/2011  Findings:  Stable postsurgical change of the superior segment of the left lower lobe.  While the external diameter of the previous identified approximately 1.3 x 1.0 cm nodule within the left lung apex (image 13, series 3) is grossly unchanged, the nodule appears more consolidated on the present examination, with slight differences possibly attributable to slice selection.  Additional approximately 8 mm ground-glass nodule within the right lower lobe (image 43, series 3) appears grossly unchanged, as does an approximately 5 mm ground-glass nodule within the superior segment of the lingula (image 34, series 3).  No new discrete pulmonary nodules.  Scattered shoddy mediastinal lymph nodes appear grossly unchanged and are individually not enlarged by size criteria with index pretracheal node measuring 9 mm in short axis diameter (image 23, series 2). No definite hilar or axillary lymphadenopathy.  Advanced  centrilobular emphysematous change.  Minimal dependent subsegmental atelectasis.  No focal airspace opacity.  No pleural effusion or pneumothorax.  The central pulmonary airways are patent.  Normal heart size.  Right anterior chest wall dual lead pacemaker. No pericardial effusion.  Normal caliber of the main pulmonary artery. Grossly unchanged mild ectasia of the ascending thoracic aorta measuring approximately 4.1 cm in greatest oblique short axis diameter (image 30, series 2).  Conventional configuration of the aortic arch.  Scattered minimal atherosclerotic plaque within the thoracic aorta.  No definite thoracic aortic dissection or periaortic stranding.  Limited evaluation of the upper abdomen demonstrates a partially exophytic approximately 3.0 cm cyst arising from the posterior superior aspect of the right kidney.  Several additional smaller renal cysts are noted bilaterally.  Post cholecystectomy.  No acute or aggressive osseous abnormalities.  IMPRESSION:  1.  No change to minimal increase in the indeterminate approximately 1.3 cm nodule within the left lung apex, with slight differences in possibly attributable to slice selection.  While possibly representing of scar, given advanced emphysematous change, a slowly growing lung cancer is not excluded.  As such, further evaluation with a follow-up chest CT in 3 months is recommended.  2.  Grossly unchanged bilateral sub centimeter ground-glass nodules, likely too small to accurately characterize.  Continued attention on follow-up is recommended.  3.  Stable postsurgical change of the superior segment of the left lower lobe.  4.  Grossly unchanged mild fusiform ectasia of the descending thoracic aorta measuring approximately 41 mm in diameter.  Above findings discussed with Dr. AZenia Residesat the time of examination completion.   Original Report Authenticated By: JJake Seats MD   Ct Chest Wo Contrast  07/14/2012  *RADIOLOGY REPORT*  Clinical Data: Follow-up  lung cancer.  CT CHEST WITHOUT CONTRAST  Technique:  Multidetector CT imaging of the chest was performed following the standard protocol without IV contrast.  Comparison: CT scan 08/18/2011.  Findings: The chest wall is stable.  A permanent right-sided pacemaker is noted.  No supraclavicular or axillary lymphadenopathy.  The bony thorax is intact.  No destructive bone lesions or spinal canal compromise.  Moderate degenerative changes involving the spine.  The heart is  normal in size.  No pericardial effusion.  No mediastinal or hilar lymphadenopathy.  Stable tortuosity and ectasia of the thoracic aorta.  The esophagus is grossly normal.  Examination of the lung parenchyma demonstrates stable advanced emphysematous changes and pulmonary interstitial scarring.  The left upper lobe pulmonary nodule has been excised.  No findings for recurrent or residual tumor.  There is a new left apical lung density.  This could reflect progressive scarring change but does need observation.  There is a 7 mm nodule at the right lung base.  This is difficult to see on the prior CT but was a on the prior PET CT and needs continued observation.  There is an ill-defined nodular density measuring approximate 5 mm in the superior segment of the left lower lobe on image number 31. I believe this was present on the prior CT scan although it is more obvious now.  A 4 mm nodule is noted on image number 22 in the left upper lobe. This was present on the prior study and has not significantly changed.  No acute pulmonary findings or pleural effusion.  The upper abdomen is unremarkable.  Stable right renal cyst.  IMPRESSION:  1.  Surgical changes from prior excision of a left apical lung neoplasm. 2.  Advanced emphysematous changes and pulmonary scarring. 3.  New 13 x 8 mm left apical lung density.  This could reflect scarring change but needs close observation. 4.  Other small pulmonary nodules as discussed above.  Recommend continued observation.  5.  No acute pulmonary findings and no mediastinal or hilar lymphadenopathy. 6.  Stable tortuosity and ectasia of the thoracic aorta.   Original Report Authenticated By: Marijo Sanes, M.D.       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 Completed 01/03/13  EGFR positive Several ground-glass nodules and several small left-sided solid nodules are present in the lungs. These are stable from 08/16/2014 but for the most part have enlarged compared to 08/18/11.  The patient continues to have multiple small bilateral nodules some which have changed slightly and others that are unchanged. With his previous history of 2 separate lung malignancies in in the left lung In the suspicious nature of the current lesions  PET scan done, the 7 mm left lower lobe lung nodule was hypermetabolic, a right lower lobe groundglass opacity is not. I discussed with the patient proceeding with bronchoscopy ebus and navigation bronchoscopy to try to delineate evidence of recurrent lung cancer and stage. Risks  and options of biopsy were discussed with the patient and his daughter in detail. He is on Coumadin for pulmonary embolus  10 years ago he is also followed by the EP  service, with their clearance we will hold his Coumadin and proceed with bronchoscopy ebus and navigation bronchoscopy June 13 .      Grace Isaac 08/29/2015 1:21 PM

## 2015-09-02 ENCOUNTER — Other Ambulatory Visit: Payer: Self-pay

## 2015-09-02 ENCOUNTER — Ambulatory Visit (HOSPITAL_COMMUNITY)
Admission: RE | Admit: 2015-09-02 | Discharge: 2015-09-02 | Disposition: A | Discharge status: Home / Self Care | Payer: Medicare Other | Source: Ambulatory Visit | Attending: Cardiothoracic Surgery | Admitting: Cardiothoracic Surgery

## 2015-09-02 ENCOUNTER — Encounter (HOSPITAL_COMMUNITY): Payer: Self-pay

## 2015-09-02 ENCOUNTER — Encounter (HOSPITAL_COMMUNITY)
Admission: RE | Admit: 2015-09-02 | Discharge: 2015-09-02 | Disposition: A | Discharge status: Home / Self Care | Payer: Medicare Other | Source: Ambulatory Visit | Attending: Cardiothoracic Surgery | Admitting: Cardiothoracic Surgery

## 2015-09-02 VITALS — BP 172/95 | HR 98 | Temp 97.7°F | Resp 18 | Ht 71.0 in

## 2015-09-02 DIAGNOSIS — J439 Emphysema, unspecified: Secondary | ICD-10-CM | POA: Diagnosis not present

## 2015-09-02 DIAGNOSIS — R918 Other nonspecific abnormal finding of lung field: Secondary | ICD-10-CM | POA: Diagnosis not present

## 2015-09-02 DIAGNOSIS — Z01818 Encounter for other preprocedural examination: Secondary | ICD-10-CM | POA: Insufficient documentation

## 2015-09-02 DIAGNOSIS — I517 Cardiomegaly: Secondary | ICD-10-CM | POA: Diagnosis not present

## 2015-09-02 HISTORY — DX: Personal history of colonic polyps: Z86.010

## 2015-09-02 HISTORY — DX: Restless legs syndrome: G25.81

## 2015-09-02 HISTORY — DX: Personal history of urinary calculi: Z87.442

## 2015-09-02 HISTORY — DX: Spontaneous ecchymoses: R23.3

## 2015-09-02 HISTORY — DX: Personal history of colon polyps, unspecified: Z86.0100

## 2015-09-02 HISTORY — DX: Localized edema: R60.0

## 2015-09-02 HISTORY — DX: Headache, unspecified: R51.9

## 2015-09-02 HISTORY — DX: Edema, unspecified: R60.9

## 2015-09-02 HISTORY — DX: Other skin changes: R23.8

## 2015-09-02 HISTORY — DX: Dizziness and giddiness: R42

## 2015-09-02 HISTORY — DX: Headache: R51

## 2015-09-02 HISTORY — DX: Urgency of urination: R39.15

## 2015-09-02 HISTORY — DX: Fibromyalgia: M79.7

## 2015-09-02 HISTORY — DX: Personal history of other venous thrombosis and embolism: Z86.718

## 2015-09-02 HISTORY — DX: Insomnia, unspecified: G47.00

## 2015-09-02 HISTORY — DX: Nocturia: R35.1

## 2015-09-02 LAB — COMPREHENSIVE METABOLIC PANEL
ALT: 14 U/L — ABNORMAL LOW (ref 17–63)
AST: 24 U/L (ref 15–41)
Albumin: 3.3 g/dL — ABNORMAL LOW (ref 3.5–5.0)
Alkaline Phosphatase: 88 U/L (ref 38–126)
Anion gap: 7 (ref 5–15)
BUN: 7 mg/dL (ref 6–20)
CO2: 28 mmol/L (ref 22–32)
Calcium: 10.9 mg/dL — ABNORMAL HIGH (ref 8.9–10.3)
Chloride: 102 mmol/L (ref 101–111)
Creatinine, Ser: 0.83 mg/dL (ref 0.61–1.24)
GFR calc Af Amer: >60 mL/min (ref >60)
GFR calc non Af Amer: >60 mL/min (ref >60)
Glucose, Bld: 91 mg/dL (ref 65–99)
Potassium: 4.3 mmol/L (ref 3.5–5.1)
Sodium: 137 mmol/L (ref 135–145)
Total Bilirubin: 1.2 mg/dL (ref 0.3–1.2)
Total Protein: 6.3 g/dL — ABNORMAL LOW (ref 6.5–8.1)

## 2015-09-02 LAB — CBC
HCT: 43.3 % (ref 39.0–52.0)
Hemoglobin: 14.3 g/dL (ref 13.0–17.0)
MCH: 31 pg (ref 26.0–34.0)
MCHC: 33 g/dL (ref 30.0–36.0)
MCV: 93.7 fL (ref 78.0–100.0)
Platelets: 232 10*3/uL (ref 150–400)
RBC: 4.62 MIL/uL (ref 4.22–5.81)
RDW: 12.5 % (ref 11.5–15.5)
WBC: 5.5 10*3/uL (ref 4.0–10.5)

## 2015-09-02 LAB — APTT: aPTT: 33 seconds (ref 24–37)

## 2015-09-02 LAB — PROTIME-INR
INR: 1.02 (ref 0.00–1.49)
Prothrombin Time: 13.6 seconds (ref 11.6–15.2)

## 2015-09-02 NOTE — Progress Notes (Addendum)
Sees Dr.Allred for pacemaker check. To see end of June   Medical Md is Dr.Wilson Arelia Sneddon  Echo multiple reports in epic  Stress test in epic from 2011  Heart cath report in epic from 2013  EKG denies

## 2015-09-02 NOTE — Progress Notes (Signed)
B/P 179/92. Pt states he hasn't taken his Metoprolol in a week. Instructed him that it is very important that he take this. Verbalized understanding.

## 2015-09-03 ENCOUNTER — Encounter: Payer: Self-pay | Admitting: *Deleted

## 2015-09-03 ENCOUNTER — Encounter (HOSPITAL_COMMUNITY)
Admission: RE | Disposition: A | Discharge status: Home / Self Care | Payer: Self-pay | Source: Ambulatory Visit | Attending: Cardiothoracic Surgery

## 2015-09-03 ENCOUNTER — Ambulatory Visit (HOSPITAL_COMMUNITY): Payer: Medicare Other | Admitting: Anesthesiology

## 2015-09-03 ENCOUNTER — Encounter (HOSPITAL_COMMUNITY): Payer: Self-pay | Admitting: *Deleted

## 2015-09-03 ENCOUNTER — Ambulatory Visit (HOSPITAL_COMMUNITY)
Admission: RE | Admit: 2015-09-03 | Discharge: 2015-09-03 | Disposition: A | Discharge status: Home / Self Care | Payer: Medicare Other | Source: Ambulatory Visit | Attending: Cardiothoracic Surgery | Admitting: Cardiothoracic Surgery

## 2015-09-03 ENCOUNTER — Ambulatory Visit (HOSPITAL_COMMUNITY): Payer: Medicare Other

## 2015-09-03 DIAGNOSIS — F17210 Nicotine dependence, cigarettes, uncomplicated: Secondary | ICD-10-CM | POA: Diagnosis not present

## 2015-09-03 DIAGNOSIS — Z9889 Other specified postprocedural states: Secondary | ICD-10-CM

## 2015-09-03 DIAGNOSIS — Z79899 Other long term (current) drug therapy: Secondary | ICD-10-CM | POA: Diagnosis not present

## 2015-09-03 DIAGNOSIS — Z85118 Personal history of other malignant neoplasm of bronchus and lung: Secondary | ICD-10-CM | POA: Diagnosis not present

## 2015-09-03 DIAGNOSIS — Z923 Personal history of irradiation: Secondary | ICD-10-CM | POA: Insufficient documentation

## 2015-09-03 DIAGNOSIS — C3412 Malignant neoplasm of upper lobe, left bronchus or lung: Secondary | ICD-10-CM

## 2015-09-03 DIAGNOSIS — J449 Chronic obstructive pulmonary disease, unspecified: Secondary | ICD-10-CM | POA: Insufficient documentation

## 2015-09-03 DIAGNOSIS — Z7901 Long term (current) use of anticoagulants: Secondary | ICD-10-CM | POA: Diagnosis not present

## 2015-09-03 DIAGNOSIS — Z419 Encounter for procedure for purposes other than remedying health state, unspecified: Secondary | ICD-10-CM

## 2015-09-03 DIAGNOSIS — I2510 Atherosclerotic heart disease of native coronary artery without angina pectoris: Secondary | ICD-10-CM | POA: Insufficient documentation

## 2015-09-03 DIAGNOSIS — R918 Other nonspecific abnormal finding of lung field: Secondary | ICD-10-CM | POA: Insufficient documentation

## 2015-09-03 DIAGNOSIS — Z86711 Personal history of pulmonary embolism: Secondary | ICD-10-CM | POA: Diagnosis not present

## 2015-09-03 DIAGNOSIS — I10 Essential (primary) hypertension: Secondary | ICD-10-CM | POA: Diagnosis not present

## 2015-09-03 DIAGNOSIS — R222 Localized swelling, mass and lump, trunk: Secondary | ICD-10-CM | POA: Diagnosis not present

## 2015-09-03 HISTORY — PX: VIDEO BRONCHOSCOPY WITH ENDOBRONCHIAL ULTRASOUND: SHX6177

## 2015-09-03 HISTORY — PX: VIDEO BRONCHOSCOPY WITH ENDOBRONCHIAL NAVIGATION: SHX6175

## 2015-09-03 SURGERY — BRONCHOSCOPY, WITH EBUS
Anesthesia: General

## 2015-09-03 MED ORDER — PROMETHAZINE HCL 25 MG/ML IJ SOLN: 6.2500 mg | INTRAMUSCULAR | Status: DC | PRN

## 2015-09-03 MED ORDER — LACTATED RINGERS IV SOLN
INTRAVENOUS | Status: DC | PRN
  Administered 2015-09-03 (×2): via INTRAVENOUS

## 2015-09-03 MED ORDER — FENTANYL CITRATE (PF) 250 MCG/5ML IJ SOLN
INTRAMUSCULAR | Status: AC
  Filled 2015-09-03: qty 5

## 2015-09-03 MED ORDER — SUGAMMADEX SODIUM 200 MG/2ML IV SOLN
INTRAVENOUS | Status: AC
  Filled 2015-09-03: qty 2

## 2015-09-03 MED ORDER — PROPOFOL 10 MG/ML IV BOLUS
INTRAVENOUS | Status: DC | PRN
  Administered 2015-09-03: 20 mg via INTRAVENOUS
  Administered 2015-09-03: 140 mg via INTRAVENOUS
  Administered 2015-09-03: 20 mg via INTRAVENOUS

## 2015-09-03 MED ORDER — ONDANSETRON HCL 4 MG/2ML IJ SOLN
INTRAMUSCULAR | Status: AC
  Filled 2015-09-03: qty 2

## 2015-09-03 MED ORDER — MIDAZOLAM HCL 2 MG/2ML IJ SOLN
INTRAMUSCULAR | Status: AC
  Filled 2015-09-03: qty 2

## 2015-09-03 MED ORDER — PROPOFOL 10 MG/ML IV BOLUS
INTRAVENOUS | Status: AC
  Filled 2015-09-03: qty 40

## 2015-09-03 MED ORDER — ROCURONIUM BROMIDE 100 MG/10ML IV SOLN
INTRAVENOUS | Status: DC | PRN
  Administered 2015-09-03: 50 mg via INTRAVENOUS

## 2015-09-03 MED ORDER — PHENYLEPHRINE HCL 10 MG/ML IJ SOLN
10.0000 mg | INTRAVENOUS | Status: DC | PRN
  Administered 2015-09-03: 25 ug/min via INTRAVENOUS

## 2015-09-03 MED ORDER — ONDANSETRON HCL 4 MG/2ML IJ SOLN
INTRAMUSCULAR | Status: DC | PRN
  Administered 2015-09-03: 4 mg via INTRAVENOUS

## 2015-09-03 MED ORDER — FENTANYL CITRATE (PF) 100 MCG/2ML IJ SOLN
INTRAMUSCULAR | Status: DC | PRN
  Administered 2015-09-03 (×2): 50 ug via INTRAVENOUS
  Administered 2015-09-03: 25 ug via INTRAVENOUS

## 2015-09-03 MED ORDER — LIDOCAINE 2% (20 MG/ML) 5 ML SYRINGE
INTRAMUSCULAR | Status: AC
Start: 2015-09-03 — End: 2015-09-03
  Filled 2015-09-03: qty 5

## 2015-09-03 MED ORDER — LIDOCAINE 2% (20 MG/ML) 5 ML SYRINGE
INTRAMUSCULAR | Status: AC
  Filled 2015-09-03: qty 5

## 2015-09-03 MED ORDER — ROCURONIUM BROMIDE 50 MG/5ML IV SOLN
INTRAVENOUS | Status: AC
  Filled 2015-09-03: qty 2

## 2015-09-03 MED ORDER — SUGAMMADEX SODIUM 200 MG/2ML IV SOLN
INTRAVENOUS | Status: DC | PRN
  Administered 2015-09-03: 200 mg via INTRAVENOUS

## 2015-09-03 MED ORDER — HYDROMORPHONE HCL 1 MG/ML IJ SOLN: 0.2500 mg | INTRAMUSCULAR | Status: DC | PRN

## 2015-09-03 MED ORDER — LIDOCAINE HCL (CARDIAC) 20 MG/ML IV SOLN
INTRAVENOUS | Status: DC | PRN
  Administered 2015-09-03: 70 mg via INTRAVENOUS

## 2015-09-03 MED ORDER — EPINEPHRINE HCL 1 MG/ML IJ SOLN
INTRAMUSCULAR | Status: AC
  Filled 2015-09-03: qty 1

## 2015-09-03 MED ORDER — SUCCINYLCHOLINE CHLORIDE 200 MG/10ML IV SOSY
PREFILLED_SYRINGE | INTRAVENOUS | Status: AC
  Filled 2015-09-03: qty 10

## 2015-09-03 MED ORDER — 0.9 % SODIUM CHLORIDE (POUR BTL) OPTIME
TOPICAL | Status: DC | PRN
  Administered 2015-09-03: 1000 mL

## 2015-09-03 SURGICAL SUPPLY — 37 items
BRUSH BIOPSY BRONCH 10 SDTNB (MISCELLANEOUS) ×2 IMPLANT
BRUSH CYTOL CELLEBRITY 1.5X140 (MISCELLANEOUS) IMPLANT
BRUSH SUPERTRAX BIOPSY (INSTRUMENTS) ×2 IMPLANT
BRUSH SUPERTRAX NDL-TIP CYTO (INSTRUMENTS) ×4 IMPLANT
CANISTER SUCTION 2500CC (MISCELLANEOUS) ×4 IMPLANT
CHANNEL WORK EXTEND EDGE 180 (KITS) IMPLANT
CHANNEL WORK EXTEND EDGE 45 (KITS) IMPLANT
CHANNEL WORK EXTEND EDGE 90 (KITS) IMPLANT
CONT SPEC 4OZ CLIKSEAL STRL BL (MISCELLANEOUS) ×8 IMPLANT
COVER DOME SNAP 22 D (MISCELLANEOUS) ×2 IMPLANT
COVER TABLE BACK 60X90 (DRAPES) ×4 IMPLANT
DRSG AQUACEL AG ADV 3.5X14 (GAUZE/BANDAGES/DRESSINGS) ×2 IMPLANT
FILTER STRAW FLUID ASPIR (MISCELLANEOUS) IMPLANT
FORCEPS BIOP RJ4 1.8 (CUTTING FORCEPS) IMPLANT
FORCEPS BIOP SUPERTRX PREMAR (INSTRUMENTS) IMPLANT
GAUZE SPONGE 4X4 12PLY STRL (GAUZE/BANDAGES/DRESSINGS) ×4 IMPLANT
GLOVE BIO SURGEON STRL SZ 6.5 (GLOVE) ×4 IMPLANT
KIT CLEAN ENDO COMPLIANCE (KITS) ×8 IMPLANT
KIT PROCEDURE EDGE 180 (KITS) IMPLANT
KIT PROCEDURE EDGE 45 (KITS) IMPLANT
KIT PROCEDURE EDGE 90 (KITS) IMPLANT
KIT ROOM TURNOVER OR (KITS) ×4 IMPLANT
MARKER SKIN DUAL TIP RULER LAB (MISCELLANEOUS) ×4 IMPLANT
NEEDLE BIOPSY TRANSBRONCH 21G (NEEDLE) IMPLANT
NEEDLE BLUNT 18X1 FOR OR ONLY (NEEDLE) IMPLANT
NEEDLE SUPERTRX PREMARK BIOPSY (NEEDLE) ×2 IMPLANT
NEEDLE SYS SONOTIP II EBUSTBNA (NEEDLE) ×2 IMPLANT
NS IRRIG 1000ML POUR BTL (IV SOLUTION) ×4 IMPLANT
OIL SILICONE PENTAX (PARTS (SERVICE/REPAIRS)) ×4 IMPLANT
PAD ARMBOARD 7.5X6 YLW CONV (MISCELLANEOUS) ×8 IMPLANT
PATCHES PATIENT (LABEL) ×6 IMPLANT
SYR 20CC LL (SYRINGE) ×2 IMPLANT
SYR 20ML ECCENTRIC (SYRINGE) ×4 IMPLANT
TOWEL OR 17X24 6PK STRL BLUE (TOWEL DISPOSABLE) ×4 IMPLANT
TRAP SPECIMEN MUCOUS 40CC (MISCELLANEOUS) ×4 IMPLANT
TUBE CONNECTING 20X1/4 (TUBING) ×4 IMPLANT
UNDERPAD 30X30 (UNDERPADS AND DIAPERS) ×2 IMPLANT

## 2015-09-03 NOTE — Progress Notes (Signed)
PCXR done in pacu.

## 2015-09-03 NOTE — Progress Notes (Signed)
Oncology Nurse Navigator Documentation  Oncology Nurse Navigator Flowsheets 09/03/2015  Navigator Encounter Type Other  Treatment Phase Pre-Tx/Tx Discussion  Barriers/Navigation Needs Coordination of Care  Interventions Coordination of Care  Coordination of Care Appts  Acuity Level 1  Time Spent with Patient 15   I received a call from Dr. Everrett Coombe office today.  Dr. Servando Snare would like [name] to be seen at [facility] this week.  They will notify patient of appt for 09/05/15 arrive at 2:30.

## 2015-09-03 NOTE — Anesthesia Procedure Notes (Signed)
Procedure Name: Intubation Date/Time: 09/03/2015 7:35 AM Performed by: Merrilyn Puma B Pre-anesthesia Checklist: Patient identified, Emergency Drugs available, Timeout performed, Suction available and Patient being monitored Patient Re-evaluated:Patient Re-evaluated prior to inductionOxygen Delivery Method: Circle system utilized Preoxygenation: Pre-oxygenation with 100% oxygen Intubation Type: IV induction Ventilation: Mask ventilation without difficulty Laryngoscope Size: Mac and 4 Grade View: Grade I Tube type: Oral Tube size: 8.5 mm Number of attempts: 1 Airway Equipment and Method: Stylet Placement Confirmation: CO2 detector,  positive ETCO2,  ETT inserted through vocal cords under direct vision and breath sounds checked- equal and bilateral Secured at: 24 cm Tube secured with: Tape Dental Injury: Teeth and Oropharynx as per pre-operative assessment

## 2015-09-03 NOTE — Brief Op Note (Signed)
      [address]       [place] [address]             [phone]      09/03/2015  10:18 AM  PATIENT:  [name]  82 y.o. male  PRE-OPERATIVE DIAGNOSIS:  Bilateral lung lobe nodules  POST-OPERATIVE DIAGNOSIS:  Bilateral lung lobe nodules  PROCEDURE:  Procedure(s): VIDEO BRONCHOSCOPY WITH ENDOBRONCHIAL ULTRASOUND (N/A) VIDEO BRONCHOSCOPY WITH ENDOBRONCHIAL NAVIGATION (N/A) and fluro And biopsy of 4r nodes, 7 nodes right lower lobe ground glass opacity and left lower lobe mass   SURGEON:  Surgeon(s) and Role:    * Grace Isaac, MD - Primary   ANESTHESIA:   general  EBL:  Total I/O In: 1000 [I.V.:1000] Out: -   BLOOD ADMINISTERED:none  DRAINS: none   LOCAL MEDICATIONS USED:  NONE  SPECIMEN:  Source of Specimen:  4r nodes, 7 node, left lower lobe and right lower lobe  DISPOSITION OF SPECIMEN:  PATHOLOGY  COUNTS:  YES   DICTATION: .Dragon Dictation  PLAN OF CARE: Discharge to home after PACU  PATIENT DISPOSITION:  PACU - hemodynamically stable.   Delay start of Pharmacological VTE agent (>24hrs) due to surgical blood loss or risk of bleeding: yes

## 2015-09-03 NOTE — Transfer of Care (Signed)
Immediate Anesthesia Transfer of Care Note  Patient: [name]  Procedure(s) Performed: Procedure(s): VIDEO BRONCHOSCOPY WITH ENDOBRONCHIAL ULTRASOUND (N/A) VIDEO BRONCHOSCOPY WITH ENDOBRONCHIAL NAVIGATION (N/A)  Patient Location: PACU  Anesthesia Type:General  Level of Consciousness: awake, alert  and oriented  Airway & Oxygen Therapy: Patient Spontanous Breathing and Patient connected to nasal cannula oxygen  Post-op Assessment: Report given to RN, Post -op Vital signs reviewed and stable and Patient moving all extremities X 4  Post vital signs: Reviewed and stable  Last Vitals:  Filed Vitals:   09/03/15 0557  BP: 180/78  Pulse: 65  Temp: 36.7 C  Resp: 18   HR 60, RR 13, Sats 100, BP 142/52  Last Pain: There were no vitals filed for this visit.    Patients Stated Pain Goal: 4 (54/65/03 5465)  Complications: No apparent anesthesia complications

## 2015-09-03 NOTE — Anesthesia Postprocedure Evaluation (Signed)
Anesthesia Post Note  Patient: [name]  Procedure(s) Performed: Procedure(s) (LRB): VIDEO BRONCHOSCOPY WITH ENDOBRONCHIAL ULTRASOUND (N/A) VIDEO BRONCHOSCOPY WITH ENDOBRONCHIAL NAVIGATION (N/A)  Patient location during evaluation: PACU Anesthesia Type: General Level of consciousness: awake Pain management: pain level controlled Vital Signs Assessment: post-procedure vital signs reviewed and stable Respiratory status: spontaneous breathing Cardiovascular status: stable Anesthetic complications: no    Last Vitals:  Filed Vitals:   09/03/15 1037 09/03/15 1053  BP: 135/68 149/84  Pulse: 55 108  Temp:  36.4 C  Resp: 12     Last Pain: There were no vitals filed for this visit.               EDWARDS,Jahmeir Geisen

## 2015-09-03 NOTE — Discharge Instructions (Signed)
Flexible Bronchoscopy, Care After Refer to this sheet in the next few weeks. These instructions provide you with information on caring for yourself after your procedure. Your health care provider may also give you more specific instructions. Your treatment has been planned according to current medical practices, but problems sometimes occur. Call your health care provider if you have any problems or questions after your procedure.  WHAT TO EXPECT AFTER THE PROCEDURE It is normal to have the following symptoms for 24-48 hours after the procedure:   Increased cough.  Low-grade fever.  Sore throat or hoarse voice.  Small streaks of blood in your thick spit (sputum) if tissue samples were taken (biopsy). HOME CARE INSTRUCTIONS   Do not eat or drink anything for 2 hours after your procedure. Your nose and throat were numbed by medicine. If you try to eat or drink before the medicine wears off, food or drink could go into your lungs or you could burn yourself. After the numbness is gone and your cough and gag reflexes have returned, you may eat soft food and drink liquids slowly.   The day after the procedure, you can go back to your normal diet.   You may resume normal activities.   Keep all follow-up visits as directed by your health care provider. It is important to keep all your appointments, especially if tissue samples were taken for testing (biopsy). SEEK IMMEDIATE MEDICAL CARE IF:   You have increasing shortness of breath.   You become light-headed or faint.   You have chest pain.   You have any new concerning symptoms.  You cough up more than a small amount of blood.  The amount of blood you cough up increases. MAKE SURE YOU:  Understand these instructions.  Will watch your condition.  Will get help right away if you are not doing well or get worse.   This information is not intended to replace advice given to you by your health care provider. Make sure you discuss  any questions you have with your health care provider.   Document Released: 09/26/2004 Document Revised: 03/30/2014 Document Reviewed: 11/11/2012 Elsevier Interactive Patient Education [employer].

## 2015-09-03 NOTE — H&P (Signed)
[address]       [place] [address]             [phone]                         [name] Onaga Medical Record #[id number] Date of Birth: 1932-04-10  [name] [name] Downing, MD  Chief Complaint:   PostOp Follow Up Visit 10/20/2011  DATE OF DISCHARGE:  OPERATIVE REPORT  Preop: Non small cell Lung cancer- left  Postop: same  SURGICAL PROCEDURE: Repeat left Mini thoracotomy, wedge resection, and  segmentectomy of the superior segment of the left lower lobe, and  placement of On-Q device.    pT1b, pNX, MX. INVASIVE MODERATELY DIFFERENTIATED ADENOCARCINOMA, SPANNING 2.2 CM IN GREATEST DIMENSION.   Wedge resection 10/20/2011  Stereotactic radio therapy to left upper lobe lesion in October 2014  Needle BX of left upper lobe lesion: [id number]  ALK negative, EGFR  positive Lung, needle/core biopsy(ies), LUL - POSITIVE FOR ADENOCARCINOMA.  History of Present Illness:     Patient has been followed  after surgical wedge resection of a T1 B. moderately differentiated adenocarcinoma the lung 2 cm in size located in the superior segment of the left lower lobe 10/20/2011. The patient previously had full left thoracotomy many years ago for "cyst".    An separate  enlarging lesion in the left upper lobe was biopsy proven adenocarcinoma and he underwent stereotactic radiotherapy in the fall of 2014  The patient had  follow-up PET scan of the chest. He notes that over the past 2-3 months he's had additional weight loss thinks up to 13 pounds. He's had no fever chills. No night sweats He says he has no appetite. His daughter notes that he continues to drink beer on a daily basis and continues smoking.    History  Smoking status  . Current Some Day Smoker -- 0.50 packs/day for 50 years  . Types: Cigarettes  Smokeless tobacco  . Current User  . Types: Chew       Allergies  Allergen Reactions  . Iohexol Shortness Of Breath     PT GETS SOB FROM IV CONTRAST.  HE REQUIRES PREMEDS PER DR. MATTERN   . Penicillins Itching and Rash    "haven't taken any  in 40 years" Has patient had a PCN reaction causing immediate rash, facial/tongue/throat swelling, SOB or lightheadedness with hypotension: NO Has patient had a PCN reaction causing severe rash involving mucus membranes or skin necrosis:NO Has patient had a PCN reaction that required hospitalization NO Has patient had a PCN reaction occurring within the last 10 years: NO If all of the above answers are "NO", then may proceed with Cephalosporin use.    No current facility-administered medications for this encounter.   Facility-Administered Medications Ordered in Other Encounters  Medication Dose Route Frequency Provider Last Rate Last Dose  . lactated ringers infusion    Continuous PRN Wilburn Cornelia, [name]        Wt Readings from Last 3 Encounters:  09/03/15 133 lb (60.328 kg)  08/29/15 133 lb (60.328 kg)  08/15/15 133 lb 8 oz (60.555 kg)     Physical Exam: BP 180/78 mmHg  Pulse 65  Temp(Src) 98 F (36.7 C) (Oral)  Resp 18  Ht '5\' 11"'$  (1.803 m)  Wt 133 lb (60.328 kg)  BMI 18.56 kg/m2  SpO2 100% Patient in sinus rhythm on palpation today  General appearance: alert and cooperative Neurologic: intact Heart: regular rate and rhythm, S1, S2 normal, no murmur, click, rub or gallop and normal apical impulse Lungs: clear to auscultation bilaterally and normal percussion bilaterally Abdomen: soft, non-tender; bowel sounds normal; no masses,  no organomegaly Extremities: extremities normal, atraumatic, no cyanosis or edema and Homans sign is negative, no sign of DVT Wounds:well healed.  No cervical or supraclavicular or axillary adenopathy   Diagnostic Studies & Laboratory data:         Recent Radiology Findings: Ct Chest Wo Contrast  08/15/2015  ADDENDUM REPORT: 08/15/2015 12:07 IMPRESSION: 1. Within the superior segment of the right lower lobe there is  an enlarging sub solid nodule. This has an equivalent diameter of 1.8 cm. Previously 1.5 cm. This is suspicious for a low-grade indolent neoplasm such as pulmonary adenocarcinoma. 2. Interval development of multiple thin walled cavitary nodules in both lungs. Although nonspecific the appearance is suggestive of pulmonary Langerhans Cell Histiocytosis. Less favored diagnostic considerations include (but are not limited to) multiple septic emboli or of metastatic squamous cell carcinoma. 3. Emphysema 4. Aortic atherosclerosis and LAD coronary artery calcification. Electronically Signed   By: Kerby Moors M.D.   On: 08/15/2015 12:07  08/15/2015  CLINICAL DATA:  History of left lung cancer in 2013.  Former smoker. EXAM: CT CHEST WITHOUT CONTRAST TECHNIQUE: Multidetector CT imaging of the chest was performed following the standard protocol without IV contrast. COMPARISON:  02/07/2015 FINDINGS: Mediastinum: The heart size appears normal. Aortic atherosclerosis noted. Calcification involving the LAD coronary artery noted. There is no mediastinal or hilar adenopathy. There is no axillary or supraclavicular adenopathy. Lungs/Pleura: No pleural fluid identified. Moderate to advanced changes of centrilobular and paraseptal emphysema. The left upper lobe pulmonary nodule is unchanged measuring 4 mm, image 43 of series 4. Sub solid nodule within the posterior right upper lobe measures 1.7 x 1.9 cm and has an equivalent diameter of 1.8 cm, image number 53 of series 4. On the previous exam this had an equivalent diameter of 1.5 cm. In the left lower lobe there is a 8 x 7 mm nodule (8 mm mean diameter) on image 63 of series 4. Previously this had an equivalent diameter of 7 mm. In the right lower low there is a 9 x 7 mm nodule (8 mm mean diameter) on image 85 of series 4. This is unchanged from previous exam. Multiple new cavitary nodules are scattered throughout both lungs. For example, in the right lower lobe new cavitary  nodule measures 7 by 6 mm, mean diameter 7 mm, image 66 of series 4. In the left lower lobe posteriorly there is a new cavitary 6 x 6 mm nodule (6 mm mean diameter) on image 78 of series 4. In the right lower lobe there is a new cavitary nodule measuring 4 x 5 mm nodule (5 mm mean diameter) on image 87 of series 4. Upper Abdomen: There is no acute findings identified within the upper abdomen. The adrenal glands are normal. There are bilateral renal cysts of varying density. These are incompletely characterized without IV contrast. Small nonobstructing calculi are noted within the upper pole of the left kidney. Musculoskeletal: No aggressive lytic or sclerotic bone lesions identified. Degenerative disc disease noted within the thoracic spine. Electronically Signed: By: Kerby Moors M.D. On: 08/15/2015 11:04   Nm Pet Image Restag (ps) Skull Base To Thigh  08/21/2015  CLINICAL DATA:  Subsequent Treatment strategy for history lung cancer. Right lower lobe sub solid pulmonary  nodule. Multiple thin-walled cavitary nodules. EXAM: NUCLEAR MEDICINE PET SKULL BASE TO THIGH TECHNIQUE: 6.5 mCi F-18 FDG was injected intravenously. Full-ring PET imaging was performed from the skull base to thigh after the radiotracer. CT data was obtained and used for attenuation correction and anatomic localization. FASTING BLOOD GLUCOSE:  Value: 103 mg/dl COMPARISON:  Chest CTs including 08/15/2015.  PET of 04/02/2014 FINDINGS: NECK Left-sided parotid hypermetabolic nodule again identified. This measures 6 mm and a S.U.V. max of 4.2 today. Compare 6 mm and a S.U.V. max of 5.9 on the prior. No cervical nodal hypermetabolism CHEST Low right paratracheal node which measures 9 mm and a S.U.V. max of 4.8 today on image 72/series 4. Compare 9 mm and a S.U.V. max of 3.4 previously. More cephalad right paratracheal node measures 8 mm and a S.U.V. max of 3.3 on image 62/series 4. This node measured similar in size and was not hypermetabolic on the  prior exam. Right hilar hypermetabolism is new, including at a S.U.V. max of 4.7. No well-defined adenopathy in this area. The sub solid superior segment right lower lobe pulmonary nodule described on the prior exam measures on the order of 1.6 cm and a S.U.V. max of 1.1 on image 32/series 8. A focus of hypermetabolism about the anterior left pleural space is without well-defined pleural mass. This measures a S.U.V. max of 2.4 on image 86/series 4 and is new. Pleural-parenchymal hypermetabolism at the left apex is favored to be due to scarring, relatively mild. 9 mm left lower lobe pulmonary nodule measures a S.U.V. max of 2.4 on image 38/series 8. This nodule measured 6 mm and was not significantly hypermetabolic on the prior PET (below PET resolution). ABDOMEN/PELVIS No areas of abnormal hypermetabolism. SKELETON No abnormal marrow activity. CT IMAGES PERFORMED FOR ATTENUATION CORRECTION Left carotid atherosclerosis. No cervical adenopathy. Chest findings deferred to recent diagnostic CT. Ascending aortic dilatation again identified including at 4.1 cm. Advanced centrilobular emphysema. Other scattered pulmonary lesions, including cavitary nodules, are below PET resolution. Normal adrenal glands. Bilateral fluid density renal lesions are likely cysts. There is minimal complexity associated with a partially calcified left renal lesion, similar. Cholecystectomy. Mild prostatomegaly. Bladder wall thickening. Although there is no significant hydronephrosis, a stone is identified at the proximal left ureter measuring 8 mm on image 135/series 4. Smaller stones are seen in the left renal collecting system. Subtle hyper attenuation is identified at the left renal pelvis on image 130/series 4. remote left rib trauma or surgery. IMPRESSION: 1. The superior segment right lower lobe sub solid pulmonary nodule is not significantly hypermetabolic. As low-grade adenocarcinoma is typically not hypermetabolic, this remains a  concern. 2. Increase in low-level hypermetabolism within non pathologically enlarged thoracic nodes. These could be reactive or related to metastatic disease. This could be re-evaluated at followup or 1 or more these nodes could be sampled. 3. A left lower lobe pulmonary nodule has enlarged since the prior PET and demonstrates hypermetabolism. Suspicious for metastatic or metachronous carcinoma. 4. No evidence of extrathoracic metastatic disease. 5. Left-sided stone currently in the proximal left ureter without significant proximal obstruction. Suspect hemorrhage in the left renal pelvis, incompletely evaluated. Depending on clinical concern, dedicated hematuria protocol CT could be performed for further delineation. 6. Prostatomegaly with suspicion of secondary bladder wall thickening/out obstruction. 7. Left parotid nodule is unchanged and likely an indolent primary neoplasm. Electronically Signed   By: Abigail Miyamoto M.D.   On: 08/21/2015 16:51      Ct Chest Wo Contrast  02/07/2015  CLINICAL DATA:  Left lung cancer 2013, status post surgery and radiation. Prostate cancer 2008. EXAM: CT CHEST WITHOUT CONTRAST TECHNIQUE: Multidetector CT imaging of the chest was performed following the standard protocol without IV contrast. COMPARISON:  08/16/2014 FINDINGS: Mediastinum/Nodes: Upper right paratracheal node 8 mm in short axis on image 20 series 3, stable by my measurement. No pathologic adenopathy is observed. Atherosclerotic aortic arch. Ascending thoracic aorta 4.3 cm diameter. The dual lead pacer noted. Lungs/Pleura: Severe emphysema. Architectural distortion and density at the left lung apex is increased from the prior exam, image 14 series 4 compared image 15 of series 4 of the prior exam. However, the increased density is disorganized, irregular, surrounds varies bulla, and accordingly is difficult to measure. A lot of this increase in density could be inflammatory. Right lower lobe 0.9 by 0.7 cm  ground-glass opacity nodule, stable by my measurements from 08/16/2014, but slowly increasing from 08/18/2011. Superior segment right lower lobe sub solid nodule 1.3 by 1.7 cm on image 27 series 4, essentially stable from 08/16/2014 but increased from 08/18/2011. 0.6 by 0.4 cm super segment left lower lobe nodule on image 24 series 4, previously 0.4 by 0.4 cm on 08/16/2014 and perhaps 2 mm in diameter on 08/18/2011. Left lower lobe solid nodule 0.7 by 0.5 cm on image 35 series 4, stable by my measurements on 08/16/2014 and previously measuring 0.5 by 0.4 cm on 08/18/2011. Upper abdomen: Cholecystectomy. Hypodense lesions of both renal upper poles favoring cysts given the photopenia on PET-CT. Musculoskeletal: Thoracic spondylosis. IMPRESSION: 1. Increase in architectural distortion and primarily interstitial density at the left lung apex, possibly from radiation therapy or inflammatory process, less likely to be locally recurrent malignancy. Merits observation. 2. Several ground-glass nodules and several small left-sided solid nodules are present in the lungs. These are stable from 08/16/2014 but for the most part have enlarged compared to 08/18/11. Multi focal low grade adenocarcinoma is certainly not excluded based on these observations. These have not been appreciably hypermetabolic on PET-CT. 3. Aneurysmal ascending aorta. Recommend annual imaging followup by CTA or MRA. This recommendation follows 2010 ACCF/AHA/AATS/ACR/ASA/SCA/SCAI/SIR/STS/SVM Guidelines for the Diagnosis and Management of Patients with Thoracic Aortic Disease. Circulation. 2010; 121: R518-A416 4. Severe emphysema. Electronically Signed   By: Van Clines M.D.   On: 02/07/2015 10:41    Ct Chest Wo Contrast  08/16/2014   CLINICAL DATA:  Left lower lobe lung cancer restaging  EXAM: CT CHEST WITHOUT CONTRAST  TECHNIQUE: Multidetector CT imaging of the chest was performed following the standard protocol without IV contrast.  COMPARISON:   05/18/2014 CT abdomen/ pelvis, most recent chest CT 12/28/2013  FINDINGS: Mediastinum/Nodes: Right-sided pacer in place. Ascending aortic ectasia measuring 4.2 cm image 33 again noted.  Heart size is normal. High pretracheal lymph node measuring 0.7 cm stable. No new hilar or mediastinal lymphadenopathy. No pericardial effusion.  Lungs/Pleura: Emphysematous changes are noted. Nodular ground-glass opacity right lower lobe measuring 0.8 cm image 47 is stable. 3 mm left upper lobe pulmonary nodule image 26 is stable. Subpleural nodularity predominantly in the left upper and lower lobes is subjectively stable. Stable left lower lobe 0.5 cm nodule image 38. Left upper lobe/perihilar linear presumed radiation fibrosis/scarring is stable. No pleural effusion.  Upper abdomen: Bilateral renal cortical cysts are again noted, partly visualized.  Musculoskeletal: No lytic or sclerotic osseous lesion. No acute osseous abnormality. No compression deformity.  IMPRESSION: No significant change in bilateral pulmonary nodules or presumed post treatment change without acute abnormality or evidence for  intrathoracic recurrence or metastasis.   Electronically Signed   By: Conchita Paris M.D.   On: 08/16/2014 09:48   I have independently reviewed the above radiology studies  and reviewed the findings with the patient.     PET:04/02/2014 CLINICAL DATA: Subsequent treatment strategy for restaging of lung cancer.  EXAM: NUCLEAR MEDICINE PET SKULL BASE TO THIGH  TECHNIQUE: 7.8 mCi F-18 FDG was injected intravenously. Full-ring PET imaging was performed from the skull base to thigh after the radiotracer. CT data was obtained and used for attenuation correction and anatomic localization.  FASTING BLOOD GLUCOSE: Value: 85 mg/dl  COMPARISON: PET of 12/01/2012. Most recent chest CT 12/28/2013.  FINDINGS: NECK  Hypermetabolism which corresponds to a posterior left parotid nodule. This measures 6 mm in a S.U.V.  max of 5.9 on image 30. On the prior PET of 12/01/2012, this measured similar in size and was hypermetabolic.  No hypermetabolic cervical nodes.  CHEST  Mild hypermetabolism corresponding to left apical pleural parenchymal thickening. A left lower lobe vague 6 mm nodule corresponds to low level, non malignant range hypermetabolism. This measures a S.U.V. max of 0.8 on image 41. This nodule was similar in size on the prior CT and has been present back to 12/01/2012.  Low right paratracheal node measures 9 mm and a S.U.V. max of 3.4 on image 76. This node measured similar in size back to 12/01/2012.  ABDOMEN/PELVIS  No areas of abnormal hypermetabolism.  SKELETON  Facet arthropathy involving the left upper cervical spine. Concurrent hypermetabolism. No suspicious osseous abnormalities.  CT IMAGES PERFORMED FOR ATTENUATION CORRECTION  Cerebral atrophy. No cervical adenopathy. Presumed sebaceous cyst about the posterior right neck at 5.6 cm.  Mild cardiomegaly. Ascending aorta upper normal in size, 3.8 cm. Similar. Aortic and branch vessel atherosclerosis. Pacer. Centrilobular emphysema. Right lower lobe 7 mm ground-glass nodule on image 48, similar. 4 mm left lower lobe nodule on image 27, similar.  Normal adrenal glands. Bilateral renal cysts. Bilateral nephrolithiasis. 11 mm hyper attenuating left renal lesion on image 135 is unchanged back to 12/01/2012. Cholecystectomy. Right common iliac artery dilatation is similar at 1.6 cm. Mild prostatomegaly. Right hydrocele. Left thoracotomy changes.  IMPRESSION: 1. A right paratracheal node is mildly hypermetabolic, but similar in size back to 2014. Favored to be reactive. 2. Otherwise, no evidence of hypermetabolic recurrent or metastatic disease. 3. Left lower lobe nodule which demonstrates low level, non malignant range hypermetabolism. Given stability back to 2014, likely benign. 4. Incidental findings,  including borderline ascending aortic dilatation, probable left renal complex cyst, and right common iliac artery dilatation. 5. Subtle hypermetabolic nodule in the left parotid gland has been present back to 2014. Although this could represent a primary parotid neoplasm, stability suggests relative indolent behavior. This could be re-evaluated on follow-up or further characterized with contrast and neck CT.   Electronically Signed  By: Abigail Miyamoto M.D.  On: 04/02/2014 12:58 Ct Chest Wo Contrast  12/28/2013   CLINICAL DATA:  Patient with history of lung cancer in 2013. Recurrence in 2014. Cough, mild shortness of breath.  EXAM: CT CHEST WITHOUT CONTRAST  TECHNIQUE: Multidetector CT imaging of the chest was performed following the standard protocol without IV contrast.  COMPARISON:  Chest CT 06/16/2013  FINDINGS: Pacer apparatus within the right anterior hemi thorax. Normal heart size. No pericardial effusion. No mediastinal or hilar adenopathy. Stable prominent sub cm mediastinal lymph nodes. Calcified atherosclerotic plaque involving the thoracic aorta.  Central airways are patent. Extensive diffuse centrilobular emphysema. Stable  8 mm right lower lobe pulmonary nodule. Stable 9 mm right upper lobe ground-glass nodule (image 23; series 4). Stable 5 mm left lower lobe pulmonary nodule (image 31; series 4). Stable left upper lobe suture line.  Incidental imaging of the upper abdomen demonstrate postcholecystectomy changes. Bilateral renal cysts. Normal adrenal glands. No aggressive or acute appearing osseous lesions.  IMPRESSION: Stable CT of the chest.  No change in pulmonary nodules.  Emphysema   Electronically Signed   By: Lovey Newcomer M.D.   On: 12/28/2013 13:18  Ct Chest Wo Contrast  08/28/2013   CLINICAL DATA:  Followup lung cancer  EXAM: CT CHEST WITHOUT CONTRAST  TECHNIQUE: Multidetector CT imaging of the chest was performed following the standard protocol without IV contrast.  COMPARISON:   06/16/2013  FINDINGS: No pleural effusion identified. Moderate to advanced changes of centrilobular emphysema identified. Pulmonary nodule within the right lower lobe is stable measuring 8 mm, image 44/series 5. Pulmonary nodule within the left lower lobe is stable measuring 5 mm, image 36/series 5. Peripheral scar like densities within the left lower lobe appear stable, image 31/series 5. Stable appearance of left upper lobe posterior suture line.  The heart size appears normal. There is no pericardial effusion. No mediastinal or hilar adenopathy. Calcified atherosclerotic disease involves the thoracic aorta.  Incidental imaging through the upper abdomen is significant for bilateral renal cysts. The adrenal glands appear normal. Patient is status post cholecystectomy.  Review of the visualized bony structures is negative for aggressive lytic or sclerotic bone lesion.  IMPRESSION: 1. Stable CT of the chest. 2. No change in bilateral pulmonary nodules. 3. Atherosclerotic disease. 4. Emphysema.   Electronically Signed   By: Kerby Moors M.D.   On: 08/28/2013 14:27   Ct Chest Wo Contrast  06/16/2013   CLINICAL DATA:  Lung cancer. Prior left upper lobe resection. Right lung cancer with radiation. History of prostate cancer.  EXAM: CT CHEST WITHOUT CONTRAST  TECHNIQUE: Multidetector CT imaging of the chest was performed following the standard protocol without IV contrast.  COMPARISON:  02/27/2013  FINDINGS: Severe emphysematous changes in the lungs. Postsurgical changes in the posterior superior left upper lobe. 12 mm nodular area in the left apex, stable. Biapical scarring. Right lower lobe nodule on image 42 measures 8 mm, stable. Subpleural densities noted in the left lateral hemithorax are unchanged. No pleural effusions. No new or enlarging pulmonary nodules. Small nodule in the left lower lobe on image 36 measures 5 mm, stable.  Pacer is noted in place with leads in the right atrium and right ventricle,  stable. Heart is normal size. Ascending aortic borderline in diameter at 3.9 cm. Small scattered mediastinal lymph nodes, none pathologically enlarged or changed since prior study. Chest wall soft tissues are unremarkable.  Large subcutaneous low-density lesion is noted posteriorly in the right posterior shoulder measuring approximately 6.2 cm, stable since PET CT. This presumably represents a large sebaceous cyst.  IMPRESSION: Stable postoperative changes in the posterior superior left upper lobe. Stable bilateral pulmonary nodules. No new or enlarging pulmonary nodules.  Mild slight dilatation of the ascending thoracic aorta measuring 4.0 cm.   Electronically Signed   By: Rolm Baptise M.D.   On: 06/16/2013 13:11  Ct Angio Head W/cm &/or Wo Cm  11/13/2012   *RADIOLOGY REPORT*  Clinical Data:  Numbness all over.  History of bilateral ICA pseudoaneurysms.  Headache.  THE PATIENT WAS PREMEDICATED WITH IV STEROIDS BECAUSE OF THE HISTORY OF CONTRAST ALLERGY.  THERE WERE  NO ADVERSE EVENTS.  CT ANGIOGRAPHY HEAD AND NECK  Technique:  Multidetector CT imaging of the head and neck was performed using the standard protocol during bolus administration of intravenous contrast.  Multiplanar CT image reconstructions including MIPs were obtained to evaluate the vascular anatomy. Carotid stenosis measurements (when applicable) are obtained utilizing NASCET criteria, using the distal internal carotid diameter as the denominator.  Contrast: 71m OMNIPAQUE IOHEXOL 350 MG/ML SOLN  Comparison:  08/25/2011 CT angio neck.  10/03/2008 CT angio head.  CTA NECK  Findings:  Right chest cardiac pacemaker.  COPD.  Spiculated left upper lobe lesion is new from priors measuring 11 x 15 mm.  In the setting of chronic of tobacco abuse, concern raised for pulmonary neoplasm.  Previously identified and biopsied, spiculated lesion superior segment left lower lobe lesion, not well seen on today's examination.  Stable subcentimeter thyroid nodules.   Negative larynx.  Advanced spondylosis.  Three-vessel arch configuration without proximal stenosis.  Unremarkable right common carotid artery.  Mild nonstenotic posterior wall plaque.  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  No stenosis.  No significant left or right vertebral artery pathology.  Left vertebral dominant.  Unremarkable left common carotid artery.  Posterior wall plaque is non stenotic in the proximal left internal carotid artery. Rim calcified predominately thrombosed 7 x 7 x 8 mm more inferior cervical ICA aneurysm projecting medially is unchanged.  More superiorly, a  partially thrombosed and calcified skull base cervical ICA aneurysm measuring 8 x 10 x 11 mm is also stable. Lateral thrombus extends cephalad to the level of the horizontal petrous/foramen lacerum level.  No ICA dissection.   Review of the MIP images confirms the above findings.  IMPRESSION:  Stable extracranial atherosclerotic change and stable bilateral cervical ICA pseudoaneurysms.  No evidence for enlargement, the dissection, or neck hemorrhage.  New spiculated left upper lobe 11 x 15 mm lesion; in the setting of COPD cannot exclude squamous cell carcinoma. As the patient has been premedicated, CT chest will be performed shortly with an additional 50 ml of contrast for expeditious  evaluation of the chest and mediastinum given his contrast allergy. Findings discussed with EDP.  CTA HEAD  Findings:  There is no evidence for acute infarction, intracranial hemorrhage, mass lesion, hydrocephalus, or extra-axial fluid.  Mild atrophy.  Mild white matter disease.  Post infusion, no abnormal enhancement.  Intact calvarium.  The  intracranial vasculature is dolichoectatic but patent.  No intracranial dissection or berry aneurysm is present.  The left vertebral is dominant.  There is no proximal vascular occlusion or  significant flow reducing lesion. Similar appearance to priors.   Review of the MIP images confirms the above findings.  IMPRESSION: Unremarkable CTA head.   Original Report Authenticated By: JRolla Flatten M.D.   Ct Angio Neck W/cm &/or Wo/cm  11/13/2012   *RADIOLOGY REPORT*  Clinical Data:  Numbness all over.  History of bilateral ICA pseudoaneurysms.  Headache.  THE PATIENT WAS PREMEDICATED WITH IV STEROIDS BECAUSE OF THE HISTORY OF CONTRAST ALLERGY.  THERE WERE NO ADVERSE EVENTS.  CT ANGIOGRAPHY HEAD AND NECK  Technique:  Multidetector CT imaging of the head and neck was performed using the standard protocol during bolus administration of intravenous contrast.  Multiplanar CT image reconstructions including MIPs were obtained to evaluate the vascular anatomy. Carotid stenosis measurements (when applicable) are obtained utilizing NASCET criteria, using  the distal internal carotid diameter as the denominator.  Contrast: 27mL OMNIPAQUE IOHEXOL 350 MG/ML SOLN  Comparison:  08/25/2011 CT angio neck.  10/03/2008 CT angio head.  CTA NECK  Findings:  Right chest cardiac pacemaker.  COPD.  Spiculated left upper lobe lesion is new from priors measuring 11 x 15 mm.  In the setting of chronic of tobacco abuse, concern raised for pulmonary neoplasm.  Previously identified and biopsied, spiculated lesion superior segment left lower lobe lesion, not well seen on today's examination.  Stable subcentimeter thyroid nodules.  Negative larynx.  Advanced spondylosis.  Three-vessel arch configuration without proximal stenosis.  Unremarkable right common carotid artery.  Mild nonstenotic posterior wall plaque.  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  No stenosis.  No significant left or right vertebral artery pathology.  Left vertebral dominant.  Unremarkable left common carotid artery.  Posterior wall plaque  is non stenotic in the proximal left internal carotid artery. Rim calcified predominately thrombosed 7 x 7 x 8 mm more inferior cervical ICA aneurysm projecting medially is unchanged.  More superiorly, a  partially thrombosed and calcified skull base cervical ICA aneurysm measuring 8 x 10 x 11 mm is also stable. Lateral thrombus extends cephalad to the level of the horizontal petrous/foramen lacerum level.  No ICA dissection.   Review of the MIP images confirms the above findings.  IMPRESSION:  Stable extracranial atherosclerotic change and stable bilateral cervical ICA pseudoaneurysms.  No evidence for enlargement, the dissection, or neck hemorrhage.  New spiculated left upper lobe 11 x 15 mm lesion; in the setting of COPD cannot exclude squamous cell carcinoma. As the patient has been premedicated, CT chest will be performed shortly with an additional 50 ml of contrast for expeditious  evaluation of the chest and mediastinum given his contrast allergy. Findings discussed with EDP.  CTA HEAD  Findings:  There is no evidence for acute infarction, intracranial hemorrhage, mass lesion, hydrocephalus, or extra-axial fluid.  Mild atrophy.  Mild white matter disease.  Post infusion, no abnormal enhancement.  Intact calvarium.  The  intracranial vasculature is dolichoectatic but patent.  No intracranial dissection or berry aneurysm is present.  The left vertebral is dominant.  There is no proximal vascular occlusion or significant flow reducing lesion. Similar appearance to priors.   Review of the MIP images confirms the above findings.  IMPRESSION: Unremarkable CTA head.   Original Report Authenticated By: Davonna Belling, M.D.   Ct Chest W Contrast  11/22/2012   *RADIOLOGY REPORT*  Clinical Data: Shortness of breath, weakness, abnormality on recently performed neck CTA.  CT CHEST WITH CONTRAST  Technique:  Multidetector CT imaging of the chest was performed following the standard protocol during bolus administration of  intravenous contrast.  Contrast: 47mL OMNIPAQUE IOHEXOL 300 MG/ML  SOLN  Comparison: Neck CT head - earlier same day; chest CT - 07/14/2012; 09/03/2011  Findings:  Stable postsurgical change of the superior segment of the left lower lobe.  While the external diameter of the previous identified approximately 1.3 x 1.0 cm nodule within the left lung apex (image 13, series 3) is grossly unchanged, the nodule appears more consolidated on the present examination, with slight differences possibly attributable to slice selection.  Additional approximately 8 mm ground-glass nodule within the right lower lobe (image 43, series 3) appears grossly unchanged, as does an approximately 5 mm ground-glass nodule within  the superior segment of the lingula (image 34, series 3).  No new discrete pulmonary nodules.  Scattered shoddy mediastinal lymph nodes appear grossly unchanged and are individually not enlarged by size criteria with index pretracheal node measuring 9 mm in short axis diameter (image 23, series 2). No definite hilar or axillary lymphadenopathy.  Advanced centrilobular emphysematous change.  Minimal dependent subsegmental atelectasis.  No focal airspace opacity.  No pleural effusion or pneumothorax.  The central pulmonary airways are patent.  Normal heart size.  Right anterior chest wall dual lead pacemaker. No pericardial effusion.  Normal caliber of the main pulmonary artery. Grossly unchanged mild ectasia of the ascending thoracic aorta measuring approximately 4.1 cm in greatest oblique short axis diameter (image 30, series 2).  Conventional configuration of the aortic arch.  Scattered minimal atherosclerotic plaque within the thoracic aorta.  No definite thoracic aortic dissection or periaortic stranding.  Limited evaluation of the upper abdomen demonstrates a partially exophytic approximately 3.0 cm cyst arising from the posterior superior aspect of the right kidney.  Several additional smaller renal cysts are  noted bilaterally.  Post cholecystectomy.  No acute or aggressive osseous abnormalities.  IMPRESSION:  1.  No change to minimal increase in the indeterminate approximately 1.3 cm nodule within the left lung apex, with slight differences in possibly attributable to slice selection.  While possibly representing of scar, given advanced emphysematous change, a slowly growing lung cancer is not excluded.  As such, further evaluation with a follow-up chest CT in 3 months is recommended.  2.  Grossly unchanged bilateral sub centimeter ground-glass nodules, likely too small to accurately characterize.  Continued attention on follow-up is recommended.  3.  Stable postsurgical change of the superior segment of the left lower lobe.  4.  Grossly unchanged mild fusiform ectasia of the descending thoracic aorta measuring approximately 41 mm in diameter.  Above findings discussed with Dr. Zenia Resides at the time of examination completion.   Original Report Authenticated By: Jake Seats, MD   Ct Chest Wo Contrast  07/14/2012  *RADIOLOGY REPORT*  Clinical Data: Follow-up lung cancer.  CT CHEST WITHOUT CONTRAST  Technique:  Multidetector CT imaging of the chest was performed following the standard protocol without IV contrast.  Comparison: CT scan 08/18/2011.  Findings: The chest wall is stable.  A permanent right-sided pacemaker is noted.  No supraclavicular or axillary lymphadenopathy.  The bony thorax is intact.  No destructive bone lesions or spinal canal compromise.  Moderate degenerative changes involving the spine.  The heart is normal in size.  No pericardial effusion.  No mediastinal or hilar lymphadenopathy.  Stable tortuosity and ectasia of the thoracic aorta.  The esophagus is grossly normal.  Examination of the lung parenchyma demonstrates stable advanced emphysematous changes and pulmonary interstitial scarring.  The left upper lobe pulmonary nodule has been excised.  No findings for recurrent or residual tumor.  There is  a new left apical lung density.  This could reflect progressive scarring change but does need observation.  There is a 7 mm nodule at the right lung base.  This is difficult to see on the prior CT but was a on the prior PET CT and needs continued observation.  There is an ill-defined nodular density measuring approximate 5 mm in the superior segment of the left lower lobe on image number 31. I believe this was present on the prior CT scan although it is more obvious now.  A 4 mm nodule is noted on image number 22  in the left upper lobe. This was present on the prior study and has not significantly changed.  No acute pulmonary findings or pleural effusion.  The upper abdomen is unremarkable.  Stable right renal cyst.  IMPRESSION:  1.  Surgical changes from prior excision of a left apical lung neoplasm. 2.  Advanced emphysematous changes and pulmonary scarring. 3.  New 13 x 8 mm left apical lung density.  This could reflect scarring change but needs close observation. 4.  Other small pulmonary nodules as discussed above.  Recommend continued observation. 5.  No acute pulmonary findings and no mediastinal or hilar lymphadenopathy. 6.  Stable tortuosity and ectasia of the thoracic aorta.   Original Report Authenticated By: Marijo Sanes, M.D.       Recent Labs: Lab Results  Component Value Date   WBC 5.5 09/02/2015   HGB 14.3 09/02/2015   HCT 43.3 09/02/2015   PLT 232 09/02/2015   GLUCOSE 91 09/02/2015   CHOL 192 07/22/2011   TRIG 76 07/22/2011   HDL 57 07/22/2011   LDLCALC 120* 07/22/2011   ALT 14* 09/02/2015   AST 24 09/02/2015   NA 137 09/02/2015   K 4.3 09/02/2015   CL 102 09/02/2015   CREATININE 0.83 09/02/2015   BUN 7 09/02/2015   CO2 28 09/02/2015   TSH 2.26 05/28/2014   INR 1.02 09/02/2015   HGBA1C 5.0 06/13/2011      Assessment / Plan:     Wedge resection of Stage I INVASIVE MODERATELY DIFFERENTIATED ADENOCARCINOMA, SPANNING 2.2 CM IN GREATEST DIMENSION superior segment left  lower lobe 2013 Ascending thoracic aorta 4.3 cm diameter The patient underwent stereotactic radiotherapy to the left upper lobe lesion. Completed 01/03/13  EGFR positive Several ground-glass nodules and several small left-sided solid nodules are present in the lungs. These are stable from 08/16/2014 but for the most part have enlarged compared to 08/18/11.  The patient continues to have multiple small bilateral nodules some which have changed slightly and others that are unchanged. With his previous history of 2 separate lung malignancies in in the left lung In the suspicious nature of the current lesions  PET scan done, the 7 mm left lower lobe lung nodule was hypermetabolic, a right lower lobe groundglass opacity is not. I discussed with the patient proceeding with bronchoscopy ebus and navigation bronchoscopy to try to delineate evidence of recurrent lung cancer and stage. Risks  and options of biopsy were discussed with the patient and his daughter in detail. He is on Coumadin for pulmonary embolus 10 years ago he is also followed by the EP service, with their clearance we will hold his Coumadin and proceed with bronchoscopy ebus and navigation bronchoscopy .  The goals risks and alternatives of the planned surgical procedure bronchoscopy ebus and navigation bronchoscopy  have been discussed with the patient in detail. The risks of the procedure including death, infection, stroke, myocardial infarction, bleeding, blood transfusion have all been discussed specifically.  I have quoted [place] a 1 % of perioperative mortality and a complication rate as high as 10 %. The patient's questions have been answered.MATTHEUS RAULS is willing  to proceed with the planned procedure.    Grace Isaac 09/03/2015 7:03 AM

## 2015-09-03 NOTE — Anesthesia Preprocedure Evaluation (Signed)
Anesthesia Evaluation  Patient identified by MRN, date of birth, ID band Patient awake    Reviewed: Allergy & Precautions, NPO status , Patient's Chart, lab work & pertinent test results  Airway Mallampati: II   Neck ROM: Full    Dental   Pulmonary shortness of breath, COPD, Current Smoker,    breath sounds clear to auscultation       Cardiovascular hypertension, + CAD and + Peripheral Vascular Disease  + dysrhythmias  Rhythm:Regular Rate:Normal     Neuro/Psych    GI/Hepatic Neg liver ROS, GERD  ,  Endo/Other    Renal/GU Renal disease     Musculoskeletal   Abdominal   Peds  Hematology   Anesthesia Other Findings   Reproductive/Obstetrics                             Anesthesia Physical Anesthesia Plan  ASA: III  Anesthesia Plan: General   Post-op Pain Management:    Induction: Intravenous  Airway Management Planned: Oral ETT  Additional Equipment:   Intra-op Plan:   Post-operative Plan: Possible Post-op intubation/ventilation  Informed Consent: I have reviewed the patients History and Physical, chart, labs and discussed the procedure including the risks, benefits and alternatives for the proposed anesthesia with the patient or authorized representative who has indicated his/her understanding and acceptance.   Dental advisory given  Plan Discussed with: [name] and Anesthesiologist  Anesthesia Plan Comments:         Anesthesia Quick Evaluation

## 2015-09-04 ENCOUNTER — Telehealth: Payer: Self-pay | Admitting: *Deleted

## 2015-09-04 ENCOUNTER — Encounter (HOSPITAL_COMMUNITY): Payer: Self-pay

## 2015-09-04 ENCOUNTER — Encounter (HOSPITAL_COMMUNITY): Payer: Self-pay | Admitting: Cardiothoracic Surgery

## 2015-09-04 NOTE — Telephone Encounter (Signed)
Called and spoke w/ pt's daughter Shauna Hugh and confirmed 09/05/15 clinic appt w/ her.

## 2015-09-05 ENCOUNTER — Other Ambulatory Visit (HOSPITAL_BASED_OUTPATIENT_CLINIC_OR_DEPARTMENT_OTHER): Payer: Medicare Other

## 2015-09-05 ENCOUNTER — Ambulatory Visit: Payer: Medicare Other | Attending: Internal Medicine | Admitting: Physical Therapy

## 2015-09-05 ENCOUNTER — Ambulatory Visit (HOSPITAL_BASED_OUTPATIENT_CLINIC_OR_DEPARTMENT_OTHER): Payer: Medicare Other | Admitting: Internal Medicine

## 2015-09-05 ENCOUNTER — Other Ambulatory Visit: Payer: Self-pay | Admitting: *Deleted

## 2015-09-05 ENCOUNTER — Ambulatory Visit (INDEPENDENT_AMBULATORY_CARE_PROVIDER_SITE_OTHER): Payer: Medicare Other | Admitting: Cardiothoracic Surgery

## 2015-09-05 ENCOUNTER — Encounter: Payer: Self-pay | Admitting: Internal Medicine

## 2015-09-05 ENCOUNTER — Encounter: Payer: Self-pay | Admitting: Cardiothoracic Surgery

## 2015-09-05 VITALS — BP 177/92 | HR 80 | Temp 98.0°F | Resp 18 | Ht 71.0 in | Wt 134.6 lb

## 2015-09-05 VITALS — BP 158/88 | HR 88 | Resp 16 | Ht 71.0 in | Wt 133.0 lb

## 2015-09-05 DIAGNOSIS — M25561 Pain in right knee: Secondary | ICD-10-CM | POA: Diagnosis present

## 2015-09-05 DIAGNOSIS — Z85118 Personal history of other malignant neoplasm of bronchus and lung: Secondary | ICD-10-CM

## 2015-09-05 DIAGNOSIS — R293 Abnormal posture: Secondary | ICD-10-CM | POA: Insufficient documentation

## 2015-09-05 DIAGNOSIS — C3412 Malignant neoplasm of upper lobe, left bronchus or lung: Secondary | ICD-10-CM

## 2015-09-05 DIAGNOSIS — M25562 Pain in left knee: Secondary | ICD-10-CM

## 2015-09-05 DIAGNOSIS — Z902 Acquired absence of lung [part of]: Secondary | ICD-10-CM

## 2015-09-05 DIAGNOSIS — R2681 Unsteadiness on feet: Secondary | ICD-10-CM | POA: Insufficient documentation

## 2015-09-05 DIAGNOSIS — Z9181 History of falling: Secondary | ICD-10-CM | POA: Insufficient documentation

## 2015-09-05 DIAGNOSIS — R911 Solitary pulmonary nodule: Secondary | ICD-10-CM | POA: Diagnosis not present

## 2015-09-05 DIAGNOSIS — C7931 Secondary malignant neoplasm of brain: Secondary | ICD-10-CM

## 2015-09-05 DIAGNOSIS — R918 Other nonspecific abnormal finding of lung field: Secondary | ICD-10-CM

## 2015-09-05 DIAGNOSIS — R42 Dizziness and giddiness: Secondary | ICD-10-CM | POA: Diagnosis not present

## 2015-09-05 LAB — CBC WITH DIFFERENTIAL/PLATELET
BASO%: 0.5 % (ref 0.0–2.0)
Basophils Absolute: 0 10*3/uL (ref 0.0–0.1)
EOS%: 2.7 % (ref 0.0–7.0)
Eosinophils Absolute: 0.2 10*3/uL (ref 0.0–0.5)
HCT: 39.9 % (ref 38.4–49.9)
HGB: 13.4 g/dL (ref 13.0–17.1)
LYMPH%: 23.7 % (ref 14.0–49.0)
MCH: 31.7 pg (ref 27.2–33.4)
MCHC: 33.6 g/dL (ref 32.0–36.0)
MCV: 94.3 fL (ref 79.3–98.0)
MONO#: 0.6 10*3/uL (ref 0.1–0.9)
MONO%: 10.8 % (ref 0.0–14.0)
NEUT#: 3.5 10*3/uL (ref 1.5–6.5)
NEUT%: 62.3 % (ref 39.0–75.0)
Platelets: 205 10*3/uL (ref 140–400)
RBC: 4.23 10*6/uL (ref 4.20–5.82)
RDW: 12.5 % (ref 11.0–14.6)
WBC: 5.6 10*3/uL (ref 4.0–10.3)
lymph#: 1.3 10*3/uL (ref 0.9–3.3)

## 2015-09-05 LAB — COMPREHENSIVE METABOLIC PANEL
ALT: 11 U/L (ref 0–55)
AST: 15 U/L (ref 5–34)
Albumin: 3.1 g/dL — ABNORMAL LOW (ref 3.5–5.0)
Alkaline Phosphatase: 91 U/L (ref 40–150)
Anion Gap: 8 mEq/L (ref 3–11)
BUN: 7.1 mg/dL (ref 7.0–26.0)
CHLORIDE: 104 meq/L (ref 98–109)
CO2: 28 meq/L (ref 22–29)
CREATININE: 1 mg/dL (ref 0.7–1.3)
Calcium: 10.6 mg/dL — ABNORMAL HIGH (ref 8.4–10.4)
EGFR: 74 mL/min/{1.73_m2} — ABNORMAL LOW (ref >90)
Glucose: 80 mg/dl (ref 70–140)
Potassium: 3.8 mEq/L (ref 3.5–5.1)
Sodium: 139 mEq/L (ref 136–145)
Total Bilirubin: 0.92 mg/dL (ref 0.20–1.20)
Total Protein: 6.6 g/dL (ref 6.4–8.3)

## 2015-09-05 NOTE — Op Note (Signed)
[name], [name] NO.:  [id number]  MEDICAL RECORD NO.:  [id number]  LOCATION:  [facility]                         FACILITY:  [facility]  PHYSICIAN:  Lanelle Bal, MD    DATE OF BIRTH:  1932/09/13  DATE OF PROCEDURE:  09/03/2015 DATE OF DISCHARGE:  09/03/2015                              OPERATIVE REPORT   PREOPERATIVE DIAGNOSIS:  Recurrent bilateral lung mass.  POSTOPERATIVE DIAGNOSIS:  Recurrent bilateral lung mass.  SURGICAL PROCEDURE:  Bronchoscopy with EBUS transbronchial biopsy of 4R and 7 nodes, electromagnetic navigation bronchoscopy with biopsy of left lower lobe lesion and right lower lobe lesion.  SURGEON:  Lanelle Bal, MD.  BRIEF HISTORY:  The patient is an 82 year old male with known previous history of carcinoma of the lung on the left.  The patient initially had a wedge resection of the lesion in a separate lobe, he developed another lung lesion and needle biopsy was performed and he underwent stereotactic radiotherapy.  The patient was continued under surveillance and has had slow enlargement of a ground-glass opacity in the right lower lobe and enlarging left lower lobe lung nodule, distinct from previous nodules.  In the upper lobe, PET scan was performed.  The 7 mm left lower lobe lesion was hypermetabolic.  The right lower lesion was not.  There was no evidence of mediastinal node enlargement, there was some mild activity in the right paratracheal nodes.  Attempt confirmed suspicion of recurrent lung cancer and it was recommended to the patient, who agreed and signed informed consent.  DESCRIPTION OF PROCEDURE:  The patient underwent general endotracheal anesthesia without incident.  After appropriate time-out was performed, a fiberoptic bronchoscope was passed to the subsegmental level, both in the right and left tracheal bronchial tree without evidence of endobronchial lesion.  We then proceeded with an endoscopic ultrasound, and did  transbronchial biopsies of 4R and 7 node.  The nodes particularly were not enlarged, but multiple aspirations were performed and sent for cytologic examination.  The initial smear showed no evidence of malignancy.  We then proceeded with the previously created plan, we 1st navigated to the right lower lobe lesion, this went smoothly with very good identification and alignment in distance with the ground-glass opacity in the right lower lobe.  We then proceeded with cytology brush and with multiple passes of a triple brush. Specimens were sent to Pathology.  The brush used was saved and cut off and sent to Pathology.  With the right side attempts, we then moved to the lesion on the left lower lobe.  This was a smaller lesion adjacent to the airway.  Initially, needle brush was performed.  We also did several needle aspirations.  During this time, we checked our location confirming good alignment with the planned nodule.  A triple brush was also used in this area.  The specimens were all appropriately labelled and submitted to Pathology.  The tracheobronchial tree was cleared of a small amount of bloody mucus, but there was no active bleeding.  The patient tolerated the procedure without obvious complication. Fluoroscopy had been used at the completion of the case confirmed no evidence of pneumothorax.  The patient was then awakened  and extubated in the operating room and transferred to recovery room for postoperative observation.  He tolerated the procedure without obvious complication.     Lanelle Bal, MD     EG/MEDQ  D:  09/04/2015  T:  09/04/2015  Job:  [id number]

## 2015-09-05 NOTE — Progress Notes (Signed)
[place] [phone]   [phone] Multidisciplinary thoracic oncology clinic  CONSULT NOTE  REFERRING PHYSICIAN: Dr. Lanelle Bal  REASON FOR CONSULTATION:  82 years old white male with recurrent lung cancer.  HPI [name] is a 82 y.o. male with past medical history significant for multiple medical problems including history of coronary artery disease, AV block, status post pacemaker placement, dyslipidemia, COPD, pulmonary embolism, GERD, history of upper gastrointestinal bleed and anemia as well as history of malnutrition. The patient also has a long history of smoking. He was diagnosed in July 2013 with stage IA (T1b, NX, MX) invasive moderately differentiated adenocarcinoma measuring 2.2 cm status post wedge resection of the left lower lobe. He was followed by observation and close monitoring. He had evidence for disease recurrence with left upper lobe nodule status post stereotactic body radiotherapy completed 01/03/2013. Molecular study of the biopsy of the left upper lobe nodule was negative for ALK gene translocation but positive for EGFR mutation in exon 20 (lack of response to EGFR tyrosine kinase inhibitor).  The patient was followed by observation and imaging studies. CT scan of the chest without contrast on 02/07/2015 showed several groundglass nodules and several small left sided solid nodules present in the lungs. These were stable from 08/16/2014 but for the most part had enlarged compared to 08/18/2011. Multifocal low-grade adenocarcinoma was not excluded. Repeat CT scan of the chest without contrast on 08/15/2015 showed an enlarging Nodule within the Superior Segment of the Right Lower Lobe Measuring 1.8 Cm Compared to 1.5 Cm Previously. This Is Suspicious for Low-Grade Indolent Neoplasm Such As Pulmonary Adenocarcinoma. There Was Also Interval Development of Multiple Central ALT Cavitary Nodules in Both Lungs, Nonspecific but the  Appearance Was Suggestive of Pulmonary Longer Hands Cell Histiocytosis versus multiple septic emboli or metastatic squamous cell carcinoma. A PET scan on 08/21/2015 showed the superior segment right lower lobe subcentimeter pulmonary nodule was not significantly hypermetabolic but remains concerning for low-grade adenocarcinoma. There was increase in the low level hypermetabolism within none pathologically enlarged thoracic nodes. Left lower lobe pulmonary nodule had enlarged since the prior PET and demonstrate hypermetabolism suspicious for metastatic or metachronous carcinoma. There was no evidence of extrathoracic metastatic disease. On 09/03/2015 the patient underwent bronchoscopy with endobronchial ultrasound and transbronchial biopsy of 4R and 7 nodes in addition to electromagnetic navigational bronchoscopy with biopsies of the left lower lobe lesion and a right lower lobe lesion. The final cytologies were negative for malignancy. Dr. Servando Snare kindly referred the patient to the multidisciplinary thoracic oncology clinic for further evaluation and recommendation regarding his condition. When seen today the patient is feeling fine with no specific complaints except for occasional dizzy spells. He also has shortness of breath with exertion and cough productive of clear sputum. He lost around 15 pounds in the last few months. He has occasional headache but no significant visual changes. He also has occasional nausea with no vomiting. Family history significant for her father died from brain tumor, mother had breast cancer in sister had COPD. The patient is a widow and has one daughter, Shauna Hugh. She lives close to [place]. The patient is planning to move with his daughter within the next few weeks. He used to work as a Administrator. He has a history of smoking 1 pack per day for around 60 years. Unfortunately continues to smoke few cigarettes every day. He also drinks beer but no history of drug  abuse.  HPI  Past Medical History  Diagnosis Date  . Hyperlipidemia     takes Lovastatin daiy  . Esophageal reflux   . Arthritis     "knees"  . CAD (coronary artery disease)     nonobstructive by cath 07/22/11 with small to moderate sized diagonal branch with ostial stenosis , medical therapy advised  . Carotid artery aneurysm (HCC)     Bilateral internal carotid artery aneurysms followed by Dr. Donnetta Hutching  . Prostate cancer ([place])     s/p cryotherapy  . Lung cancer, primary, with metastasis from lung to other site [facility]) dx'd 09/2011 & 11/2012  . Anemia   . COPD (chronic obstructive pulmonary disease) (HCC)     Albuterol as needed  . Insomnia     takes Ambien nightly  . Hypertension     takes Metoprolol daily  . AV block     s/p PPM.Takes Coumadin daily but on hold until after surgery  . History of blood clots     lungs.On Coumadin for over 10 yrs ago  . Shortness of breath     lying,sitting,exertion  . Headache     occasionally  . Dizziness     rarely  . Restless leg   . Peripheral edema   . Fibromyalgia   . Bruises easily   . History of colon polyps   . History of kidney stones   . Urinary urgency   . Nocturia     Past Surgical History  Procedure Laterality Date  . Cystoscopy    . Lung surgery  1957    "born w/bleb in [place]  . Insert / replace / remove pacemaker  1991    initial placement  . Insert / replace / remove pacemaker  ~ 2011    "changed out"  . Tonsillectomy and adenoidectomy  1943  . Appendectomy  2000's  . Cholecystectomy  2000's  . Cataract extraction w/ intraocular lens implant Bilateral ~ 2008  . Lithotripsy  ~ 2011    "twice"  . Cardiac catheterization    . Prostate cryoablation  ~ 2008  . Video bronchoscopy  10/20/2011    Procedure: VIDEO BRONCHOSCOPY;  Surgeon: Grace Isaac, MD;  Location: [facility];  Service: Thoracic;  Laterality: N/A;  . Needle core biopsy  12/06/2012    Lung/lul  . Esophagogastroduodenoscopy N/A 09/20/2013    Procedure:  ESOPHAGOGASTRODUODENOSCOPY (EGD);  Surgeon: Winfield Cunas., MD;  Location: [facility] ENDOSCOPY;  Service: Endoscopy;  Laterality: N/A;  . Left heart catheterization with coronary angiogram N/A 07/22/2011    Procedure: LEFT HEART CATHETERIZATION WITH CORONARY ANGIOGRAM;  Surgeon: Burnell Blanks, MD;  Location: [facility] CATH LAB;  Service: Cardiovascular;  Laterality: N/A;  . Colonoscopy    . Colonoscopy    . Video bronchoscopy with endobronchial ultrasound N/A 09/03/2015    Procedure: VIDEO BRONCHOSCOPY WITH ENDOBRONCHIAL ULTRASOUND;  Surgeon: Grace Isaac, MD;  Location: [facility];  Service: Thoracic;  Laterality: N/A;  . Video bronchoscopy with endobronchial navigation N/A 09/03/2015    Procedure: VIDEO BRONCHOSCOPY WITH ENDOBRONCHIAL NAVIGATION;  Surgeon: Grace Isaac, MD;  Location: [facility] OR;  Service: Thoracic;  Laterality: N/A;    Family History  Problem Relation Age of Onset  . Hypertension Neg Hx   . Hyperlipidemia Neg Hx   . Heart failure Neg Hx   . Heart disease Neg Hx   . Colon cancer Neg Hx   . Breast cancer Mother   . Cancer Mother     Breast  . Emphysema Sister  was a smoker  . Brain cancer Father 88     type unknown    Social History Social History  Substance Use Topics  . Smoking status: Current Some Day Smoker -- 0.50 packs/day for 50 years    Types: Cigarettes  . Smokeless tobacco: Current User    Types: Chew  . Alcohol Use: No     Comment: 3-4 beers a day    Allergies  Allergen Reactions  . Iohexol Shortness Of Breath    PT GETS SOB FROM IV CONTRAST.  HE REQUIRES PREMEDS PER DR. MATTERN   . Penicillins Itching and Rash    "haven't taken any  in 40 years" Has patient had a PCN reaction causing immediate rash, facial/tongue/throat swelling, SOB or lightheadedness with hypotension: NO Has patient had a PCN reaction causing severe rash involving mucus membranes or skin necrosis:NO Has patient had a PCN reaction that required hospitalization NO Has  patient had a PCN reaction occurring within the last 10 years: NO If all of the above answers are "NO", then may proceed with Cephalosporin use.    Current Outpatient Prescriptions  Medication Sig Dispense Refill  . escitalopram (LEXAPRO) 20 MG tablet Take 20 mg by mouth daily.    Marland Kitchen lovastatin (MEVACOR) 20 MG tablet Take 20 mg by mouth daily.    . metoprolol tartrate (LOPRESSOR) 25 MG tablet TAKE ONE BY MOUTH TWO TIMES A DAY. 60 tablet 10  . warfarin (COUMADIN) 5 MG tablet Take 2.5-5 mg by mouth daily. Take 1 tablet (5 mg) by mouth 1st day in the morning, then take 1/2 tablet (2.5 mg) 2nd day in the morning, then repeat (or as directed by coumadin clinic)    . zolpidem (AMBIEN) 5 MG tablet Take 5 mg by mouth at bedtime as needed for sleep.    Marland Kitchen albuterol (PROVENTIL HFA;VENTOLIN HFA) 108 (90 BASE) MCG/ACT inhaler Inhale 2 puffs into the lungs every 6 (six) hours as needed for wheezing or shortness of breath. Reported on 09/05/2015    . nitroGLYCERIN (NITROSTAT) 0.4 MG SL tablet Place 1 tablet (0.4 mg total) under the tongue every 5 (five) minutes as needed. For chest pain (Patient not taking: Reported on 09/05/2015) 30 tablet 3   No current facility-administered medications for this visit.    Review of Systems  Constitutional: positive for fatigue Eyes: negative Ears, nose, mouth, throat, and face: negative Respiratory: positive for cough, dyspnea on exertion and sputum Cardiovascular: negative Gastrointestinal: negative Genitourinary:negative Integument/breast: negative Hematologic/lymphatic: negative Musculoskeletal:negative Neurological: negative Behavioral/Psych: negative Endocrine: negative Allergic/Immunologic: negative  Physical Exam  OVF:IEPPI, healthy, no distress, well nourished and well developed SKIN: skin color, texture, turgor are normal, no rashes or significant lesions HEAD: Normocephalic, No masses, lesions, tenderness or abnormalities EYES: normal, PERRLA,  Conjunctiva are pink and non-injected EARS: External ears normal, Canals clear OROPHARYNX:no exudate, no erythema and lips, buccal mucosa, and tongue normal  NECK: supple, no adenopathy, no JVD LYMPH:  no palpable lymphadenopathy, no hepatosplenomegaly LUNGS: clear to auscultation , and palpation HEART: regular rate & rhythm, no murmurs and no gallops ABDOMEN:abdomen soft, non-tender, normal bowel sounds and no masses or organomegaly BACK: Back symmetric, no curvature., No CVA tenderness EXTREMITIES:no joint deformities, effusion, or inflammation, no edema, no skin discoloration  NEURO: alert & oriented x 3 with fluent speech, no focal motor/sensory deficits  PERFORMANCE STATUS: ECOG 1  LABORATORY DATA: Lab Results  Component Value Date   WBC 5.6 09/05/2015   HGB 13.4 09/05/2015   HCT 39.9 09/05/2015  MCV 94.3 09/05/2015   PLT 205 09/05/2015      Chemistry      Component Value Date/Time   NA 139 09/05/2015 1422   NA 137 09/02/2015 0817   K 3.8 09/05/2015 1422   K 4.3 09/02/2015 0817   CL 102 09/02/2015 0817   CO2 28 09/05/2015 1422   CO2 28 09/02/2015 0817   BUN 7.1 09/05/2015 1422   BUN 7 09/02/2015 0817   CREATININE 1.0 09/05/2015 1422   CREATININE 0.83 09/02/2015 0817   CREATININE 1.23 08/21/2011 1014      Component Value Date/Time   CALCIUM 10.6* 09/05/2015 1422   CALCIUM 10.9* 09/02/2015 0817   ALKPHOS 91 09/05/2015 1422   ALKPHOS 88 09/02/2015 0817   AST 15 09/05/2015 1422   AST 24 09/02/2015 0817   ALT 11 09/05/2015 1422   ALT 14* 09/02/2015 0817   BILITOT 0.92 09/05/2015 1422   BILITOT 1.2 09/02/2015 0817       RADIOGRAPHIC STUDIES: Dg Chest 2 View  09/02/2015  CLINICAL DATA:  PRE-ADMIT FOR BRONCHOSCOPY.HX Lung nodules.HX COPD,HTN EXAM: CHEST  2 VIEW COMPARISON:  PET-CT 08/21/2015 and chest CT 08/15/2015 FINDINGS: Lungs are hyperinflated. Emphysematous changes are identified. Discrete nodule is identified at the right lung base. Other nodules are too  small to be seen radiographically. No focal consolidations or pleural effusions. Stable left basilar pleural scarring and elevation of left hemidiaphragm. Stable left apical pleural parenchymal scarring. Patient has a right-sided transvenous pacemaker with leads to the right atrium and right ventricle. IMPRESSION: 1. Emphysematous and chronic pulmonary changes. 2. No new consolidations. Electronically Signed   By: Nolon Nations M.D.   On: 09/02/2015 08:50   Ct Chest Wo Contrast  08/15/2015  ADDENDUM REPORT: 08/15/2015 12:07 IMPRESSION: 1. Within the superior segment of the right lower lobe there is an enlarging sub solid nodule. This has an equivalent diameter of 1.8 cm. Previously 1.5 cm. This is suspicious for a low-grade indolent neoplasm such as pulmonary adenocarcinoma. 2. Interval development of multiple thin walled cavitary nodules in both lungs. Although nonspecific the appearance is suggestive of pulmonary Langerhans Cell Histiocytosis. Less favored diagnostic considerations include (but are not limited to) multiple septic emboli or of metastatic squamous cell carcinoma. 3. Emphysema 4. Aortic atherosclerosis and LAD coronary artery calcification. Electronically Signed   By: Kerby Moors M.D.   On: 08/15/2015 12:07  08/15/2015  CLINICAL DATA:  History of left lung cancer in 2013.  Former smoker. EXAM: CT CHEST WITHOUT CONTRAST TECHNIQUE: Multidetector CT imaging of the chest was performed following the standard protocol without IV contrast. COMPARISON:  02/07/2015 FINDINGS: Mediastinum: The heart size appears normal. Aortic atherosclerosis noted. Calcification involving the LAD coronary artery noted. There is no mediastinal or hilar adenopathy. There is no axillary or supraclavicular adenopathy. Lungs/Pleura: No pleural fluid identified. Moderate to advanced changes of centrilobular and paraseptal emphysema. The left upper lobe pulmonary nodule is unchanged measuring 4 mm, image 43 of series 4. Sub  solid nodule within the posterior right upper lobe measures 1.7 x 1.9 cm and has an equivalent diameter of 1.8 cm, image number 53 of series 4. On the previous exam this had an equivalent diameter of 1.5 cm. In the left lower lobe there is a 8 x 7 mm nodule (8 mm mean diameter) on image 63 of series 4. Previously this had an equivalent diameter of 7 mm. In the right lower low there is a 9 x 7 mm nodule (8 mm mean diameter) on  image 85 of series 4. This is unchanged from previous exam. Multiple new cavitary nodules are scattered throughout both lungs. For example, in the right lower lobe new cavitary nodule measures 7 by 6 mm, mean diameter 7 mm, image 66 of series 4. In the left lower lobe posteriorly there is a new cavitary 6 x 6 mm nodule (6 mm mean diameter) on image 78 of series 4. In the right lower lobe there is a new cavitary nodule measuring 4 x 5 mm nodule (5 mm mean diameter) on image 87 of series 4. Upper Abdomen: There is no acute findings identified within the upper abdomen. The adrenal glands are normal. There are bilateral renal cysts of varying density. These are incompletely characterized without IV contrast. Small nonobstructing calculi are noted within the upper pole of the left kidney. Musculoskeletal: No aggressive lytic or sclerotic bone lesions identified. Degenerative disc disease noted within the thoracic spine. Electronically Signed: By: Kerby Moors M.D. On: 08/15/2015 11:04   Nm Pet Image Restag (ps) Skull Base To Thigh  08/21/2015  CLINICAL DATA:  Subsequent Treatment strategy for history lung cancer. Right lower lobe sub solid pulmonary nodule. Multiple thin-walled cavitary nodules. EXAM: NUCLEAR MEDICINE PET SKULL BASE TO THIGH TECHNIQUE: 6.5 mCi F-18 FDG was injected intravenously. Full-ring PET imaging was performed from the skull base to thigh after the radiotracer. CT data was obtained and used for attenuation correction and anatomic localization. FASTING BLOOD GLUCOSE:   Value: 103 mg/dl COMPARISON:  Chest CTs including 08/15/2015.  PET of 04/02/2014 FINDINGS: NECK Left-sided parotid hypermetabolic nodule again identified. This measures 6 mm and a S.U.V. max of 4.2 today. Compare 6 mm and a S.U.V. max of 5.9 on the prior. No cervical nodal hypermetabolism CHEST Low right paratracheal node which measures 9 mm and a S.U.V. max of 4.8 today on image 72/series 4. Compare 9 mm and a S.U.V. max of 3.4 previously. More cephalad right paratracheal node measures 8 mm and a S.U.V. max of 3.3 on image 62/series 4. This node measured similar in size and was not hypermetabolic on the prior exam. Right hilar hypermetabolism is new, including at a S.U.V. max of 4.7. No well-defined adenopathy in this area. The sub solid superior segment right lower lobe pulmonary nodule described on the prior exam measures on the order of 1.6 cm and a S.U.V. max of 1.1 on image 32/series 8. A focus of hypermetabolism about the anterior left pleural space is without well-defined pleural mass. This measures a S.U.V. max of 2.4 on image 86/series 4 and is new. Pleural-parenchymal hypermetabolism at the left apex is favored to be due to scarring, relatively mild. 9 mm left lower lobe pulmonary nodule measures a S.U.V. max of 2.4 on image 38/series 8. This nodule measured 6 mm and was not significantly hypermetabolic on the prior PET (below PET resolution). ABDOMEN/PELVIS No areas of abnormal hypermetabolism. SKELETON No abnormal marrow activity. CT IMAGES PERFORMED FOR ATTENUATION CORRECTION Left carotid atherosclerosis. No cervical adenopathy. Chest findings deferred to recent diagnostic CT. Ascending aortic dilatation again identified including at 4.1 cm. Advanced centrilobular emphysema. Other scattered pulmonary lesions, including cavitary nodules, are below PET resolution. Normal adrenal glands. Bilateral fluid density renal lesions are likely cysts. There is minimal complexity associated with a partially  calcified left renal lesion, similar. Cholecystectomy. Mild prostatomegaly. Bladder wall thickening. Although there is no significant hydronephrosis, a stone is identified at the proximal left ureter measuring 8 mm on image 135/series 4. Smaller stones are seen in  the left renal collecting system. Subtle hyper attenuation is identified at the left renal pelvis on image 130/series 4. remote left rib trauma or surgery. IMPRESSION: 1. The superior segment right lower lobe sub solid pulmonary nodule is not significantly hypermetabolic. As low-grade adenocarcinoma is typically not hypermetabolic, this remains a concern. 2. Increase in low-level hypermetabolism within non pathologically enlarged thoracic nodes. These could be reactive or related to metastatic disease. This could be re-evaluated at followup or 1 or more these nodes could be sampled. 3. A left lower lobe pulmonary nodule has enlarged since the prior PET and demonstrates hypermetabolism. Suspicious for metastatic or metachronous carcinoma. 4. No evidence of extrathoracic metastatic disease. 5. Left-sided stone currently in the proximal left ureter without significant proximal obstruction. Suspect hemorrhage in the left renal pelvis, incompletely evaluated. Depending on clinical concern, dedicated hematuria protocol CT could be performed for further delineation. 6. Prostatomegaly with suspicion of secondary bladder wall thickening/out obstruction. 7. Left parotid nodule is unchanged and likely an indolent primary neoplasm. Electronically Signed   By: Abigail Miyamoto M.D.   On: 08/21/2015 16:51   Dg Chest Port 1 View  09/03/2015  CLINICAL DATA:  Status post bronchoscopy and lung biopsy. EXAM: PORTABLE CHEST 1 VIEW COMPARISON:  Portable chest obtained yesterday, PET-CT dated 08/21/2015 and chest CT dated 525 1,017. FINDINGS: Normal sized heart. The lungs are clear. Stable mild elevation of the left hemidiaphragm. No pneumothorax. Biapical pleural and  parenchymal scarring is noted. Previously demonstrated bilateral nipple shadows are not currently visualized. No definite lung nodules are visible today. Stable right subclavian pacemaker leads and thoracic spine degenerative changes. IMPRESSION: No acute abnormality.  No pneumothorax seen. Electronically Signed   By: Claudie Revering M.D.   On: 09/03/2015 10:59   Dg C-arm Bronchoscopy  09/03/2015  CLINICAL DATA:  C-ARM BRONCHOSCOPY Fluoroscopy was utilized by the requesting physician.  No radiographic interpretation.    ASSESSMENT: This is a very pleasant 82 years old white male with history of stage IA (T1b, NX, MX) non-small cell lung cancer, adenocarcinoma status post wedge resection of the left lower lobe in July 2013 as well as stage IA (T1a, NX, MX) non-small cell lung cancer, adenocarcinoma with positive EGFR mutation in exon 20 status post stereotactic body radiotherapy in October 2014. He has been observation for several years and recently has suspicious left upper lobe pulmonary nodule as well as nonspecific cavitary nodules bilaterally in May 2017. The recent bronchoscopy and endobronchial ultrasound showed no evidence of malignancy.  PLAN: I had a lengthy discussion with the patient and his daughter today about his current condition. The only suspicious hypermetabolic nodule is a left lower pulmonary nodule. I recommended for the patient to consider CT-guided core biopsy of this nodule to rule out any disease recurrence. The patient and his daughter mentioned that he is moving to [place] to live with his daughter within the next few weeks. I recommended for the patient to see a medical oncologist in the [place] area. The daughter mentions that her mother was treated by Dr. Kinnie Scales and she will arrange for the patient a follow-up appointment with her. I agreed with this plan and to have the biopsy performed there. For the dizzy spells, Dr. Servando Snare order CT scan of the head. I will see the  patient as needed basis at this point. He was advised to call immediately if he has any concerning symptoms before seeing Dr. Kinnie Scales. The patient voices understanding of current disease status and treatment options and is  in agreement with the current care plan.  All questions were answered. The patient knows to call the clinic with any problems, questions or concerns. We can certainly see the patient much sooner if necessary.  Thank you so much for allowing me to participate in the care of TSUGIO ELISON. I will continue to follow up the patient with you and assist in his care.  I spent 40 minutes counseling the patient face to face. The total time spent in the appointment was 60 minutes.  Disclaimer: This note was dictated with voice recognition software. Similar sounding words can inadvertently be transcribed and may not be corrected upon review.   Landi Biscardi K. September 05, 2015, 3:47 PM

## 2015-09-05 NOTE — Therapy (Signed)
[place], [place], [address] Phone: [phone]   Fax:  [phone]  Physical Therapy Evaluation  Patient Details  Name: [name] MRN: [id number] Date of Birth: 1933/01/24 Referring Provider: Dr. Curt Bears  Encounter Date: 09/05/2015      PT End of Session - 09/05/15 1634    Visit Number 1   Number of Visits 1   PT Start Time 2707   PT Stop Time 1520   PT Time Calculation (min) 33 min   Activity Tolerance Patient tolerated treatment well   Behavior During Therapy [facility] for tasks assessed/performed      Past Medical History  Diagnosis Date  . Hyperlipidemia     takes Lovastatin daiy  . Esophageal reflux   . Arthritis     "knees"  . CAD (coronary artery disease)     nonobstructive by cath 07/22/11 with small to moderate sized diagonal branch with ostial stenosis , medical therapy advised  . Carotid artery aneurysm (HCC)     Bilateral internal carotid artery aneurysms followed by Dr. Donnetta Hutching  . Prostate cancer ([place])     s/p cryotherapy  . Lung cancer, primary, with metastasis from lung to other site [facility]) dx'd 09/2011 & 11/2012  . Anemia   . COPD (chronic obstructive pulmonary disease) (HCC)     Albuterol as needed  . Insomnia     takes Ambien nightly  . Hypertension     takes Metoprolol daily  . AV block     s/p PPM.Takes Coumadin daily but on hold until after surgery  . History of blood clots     lungs.On Coumadin for over 10 yrs ago  . Shortness of breath     lying,sitting,exertion  . Headache     occasionally  . Dizziness     rarely  . Restless leg   . Peripheral edema   . Fibromyalgia   . Bruises easily   . History of colon polyps   . History of kidney stones   . Urinary urgency   . Nocturia     Past Surgical History  Procedure Laterality Date  . Cystoscopy    . Lung surgery  1957    "born w/bleb in [place]  . Insert / replace / remove pacemaker  1991    initial  placement  . Insert / replace / remove pacemaker  ~ 2011    "changed out"  . Tonsillectomy and adenoidectomy  1943  . Appendectomy  2000's  . Cholecystectomy  2000's  . Cataract extraction w/ intraocular lens implant Bilateral ~ 2008  . Lithotripsy  ~ 2011    "twice"  . Cardiac catheterization    . Prostate cryoablation  ~ 2008  . Video bronchoscopy  10/20/2011    Procedure: VIDEO BRONCHOSCOPY;  Surgeon: Grace Isaac, MD;  Location: [facility];  Service: Thoracic;  Laterality: N/A;  . Needle core biopsy  12/06/2012    Lung/lul  . Esophagogastroduodenoscopy N/A 09/20/2013    Procedure: ESOPHAGOGASTRODUODENOSCOPY (EGD);  Surgeon: Winfield Cunas., MD;  Location: [facility] ENDOSCOPY;  Service: Endoscopy;  Laterality: N/A;  . Left heart catheterization with coronary angiogram N/A 07/22/2011    Procedure: LEFT HEART CATHETERIZATION WITH CORONARY ANGIOGRAM;  Surgeon: Burnell Blanks, MD;  Location: [facility] CATH LAB;  Service: Cardiovascular;  Laterality: N/A;  . Colonoscopy    . Colonoscopy    . Video bronchoscopy with endobronchial ultrasound N/A 09/03/2015    Procedure: VIDEO BRONCHOSCOPY WITH  ENDOBRONCHIAL ULTRASOUND;  Surgeon: Grace Isaac, MD;  Location: [facility];  Service: Thoracic;  Laterality: N/A;  . Video bronchoscopy with endobronchial navigation N/A 09/03/2015    Procedure: VIDEO BRONCHOSCOPY WITH ENDOBRONCHIAL NAVIGATION;  Surgeon: Grace Isaac, MD;  Location[facility] OR;  Service: Thoracic;  Laterality: N/A;    There were no vitals filed for this visit.       Subjective Assessment - 09/05/15 1623    Subjective Reports intermittent dizziness that does occur daily (head CT has been ordered).   Patient is accompained by: Family member  daughter [name]   Pertinent History Patient was followed due to h/o multiple lung cancer episodes; also has had weight loss.  New nodule was found in left upper lobe, positive for adenocarcinoma and left lower lobe small nodule.  He expects to have  another biopsy done and possible oral biologic for EGFR+ cancer.  Has pacemaker; on coumadin since PE in 2005; current smoker; HTN.   Patient Stated Goals get info from all lung clinic providers   Currently in Pain? Yes   Pain Score 5    Pain Location Knee   Pain Orientation Right;Left   Pain Type Chronic pain  arthritis pain   Aggravating Factors  sitting, riding   Pain Relieving Factors walking around            [facility] PT Assessment - 09/05/15 0001    Assessment   Medical Diagnosis abnormal lung scans; adenocarcinoma   Referring Provider Dr. Curt Bears   Onset Date/Surgical Date 08/15/15   Precautions   Precautions Fall;Other (comment)   Precaution Comments pacemaker; HOH; cancer precautions   Restrictions   Weight Bearing Restrictions No   Balance Screen   Has the patient fallen in the past 6 months Yes   How many times? 1  tripped over his dog   Has the patient had a decrease in activity level because of a fear of falling?  No   Is the patient reluctant to leave their home because of a fear of falling?  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; Chiropractor, stays active   Cognition   Overall Cognitive Status Within Functional Limits for tasks assessed   Functional Tests   Functional tests Sit to Stand   Sit to Stand   Comments 5 times in 30 seconds, below average; stopped because of knee pain   Posture/Postural Control   Posture/Postural Control Postural limitations   Postural Limitations Forward head;Increased thoracic kyphosis   Posture Comments slight kyphosis   ROM / Strength   AROM / PROM / Strength AROM   AROM   Overall AROM Comments trunk AROM in standing about 25% limited in all directions, partly due  to balance insecurity   Ambulation/Gait   Ambulation/Gait Yes   Ambulation/Gait Assistance 6: Modified independent (Device/Increase time)   Gait Comments no assistive device, but distance limited   Balance   Balance Assessed Yes   Static Standing Balance   Static Standing - Balance Support Right upper extremity supported  keeps his hand on a counter top most of the time   Dynamic Standing Balance   Dynamic Standing - Comments reaches forward less than 7 inches in standing, and  seeks support while doing this  below average for age                           PT Education - 09/05/15 1633    Education provided Yes   Education Details posture, breathing, walking, Cure article on staying active during treatment, PT info   Person(s) Educated Patient;Child(ren)   Methods Explanation;Handout   Comprehension Verbalized understanding               Lung Clinic Goals - 09/05/15 1638    Patient will be able to verbalize understanding of the benefit of exercise to decrease fatigue.   Status Achieved   Patient will be able to verbalize the importance of posture.   Status Achieved   Patient will be able to demonstrate diaphragmatic breathing for improved lung function.   Status Achieved   Patient will be able to verbalize understanding of the role of physical therapy to prevent functional decline and who to contact if physical therapy is needed.   Status Achieved             Plan - 09/05/15 1634    Clinical Impression Statement Older gentleman with h/o multiple lung cancers now with another episode.  He expects to have another biopsy and probably will be treated with an oral biologic.  He has impaired balance, decreased ROM, h/o falling, knee pain from arthritis, impaired mobility.   Rehab Potential Fair   PT Frequency One time visit   PT Treatment/Interventions Patient/family education   PT Next Visit Plan None at this time. He may benefit from therapy going  forward depending on how he does with treatment.  He may need to seek this in the [place] area.   PT Home Exercise Plan see education section   Consulted and Agree with Plan of Care Patient      Patient will benefit from skilled therapeutic intervention in order to improve the following deficits and impairments:  Decreased balance, Pain, Decreased mobility  Visit Diagnosis: History of falling - Plan: PT plan of care cert/re-cert  Unsteadiness on feet - Plan: PT plan of care cert/re-cert  Pain in right knee - Plan: PT plan of care cert/re-cert  Pain in left knee - Plan: PT plan of care cert/re-cert  Abnormal posture - Plan: PT plan of care cert/re-cert      G-Codes - 04/59/97 1639    Functional Assessment Tool Used clinical judgement   Functional Limitation Changing and maintaining body position   Changing and Maintaining Body Position Current Status (F4142) At least 20 percent but less than 40 percent impaired, limited or restricted   Changing and Maintaining Body Position Goal Status (L9532) At least 20 percent but less than 40 percent impaired, limited or restricted   Changing and Maintaining Body Position Discharge Status (Y2334) At least 20 percent but less than 40 percent impaired, limited or restricted       Problem List Patient Active Problem List   Diagnosis Date Noted  . SVT (supraventricular tachycardia) ([place]) 04/29/2015  . Cellulitis of right ankle 04/09/2015  . Joint pain 04/09/2015  . Chronic anticoagulation 04/09/2015  . Benign essential HTN 04/09/2015  . Cellulitis 04/09/2015  . Sinus tachycardia by electrocardiogram 05/28/2014  . RLS (restless legs syndrome) 09/20/2013  . Protein-calorie malnutrition, severe ([place]) 09/19/2013  . Melena 09/18/2013  . UGIB (upper gastrointestinal bleed) 09/18/2013  . Anemia due to blood loss, acute 09/18/2013  . Complete heart block  s/p PPM 1991 09/18/2013  . Hypovolemic shock ([place]) 09/18/2013  . Acute uremia 09/18/2013   . PE (pulmonary embolism) 2005 on Coumadin 09/18/2013  . COPD (chronic obstructive pulmonary disease) ([place]) 09/18/2013  . Personal history of prostate cancer s/p cryosurg 09/18/2013  . Hyperparathyroidism ([place]) 03/01/2013  . Lung cancer, upper lobe Left 10/20/2011  . Occlusion and stenosis of carotid artery without mention of cerebral infarction 08/25/2011  . Hypotension 06/13/2011  . Jaw pain 03/05/2011  . OTHER SECOND DEGREE ATRIOVENTRICULAR BLOCK 05/26/2010  . AV BLOCK, COMPLETE 11/22/2009  . PACEMAKER-[place] 03/21/2009  . HYPERLIPIDEMIA 03/12/2009  . Coronary atherosclerosis 03/12/2009  . PULMONARY EMBOLISM 03/12/2009  . Chest pain 03/12/2009  . COPD 06/17/2007  . GERD 06/17/2007  . DYSPNEA 06/17/2007  . CHOKING 06/17/2007    Bowdy Bair 09/05/2015, 4:41 PM  [place], [place], [address] Phone: [phone]   Fax:  [phone]  Name: TRYSTYN DOLLEY MRN: [id number] Date of Birth: 20-Oct-1932   Serafina Royals, PT 09/05/2015 4:41 PM

## 2015-09-05 NOTE — Patient Instructions (Signed)
Smoking Cessation, Tips for Success If you are ready to quit smoking, congratulations! You have chosen to help yourself be healthier. Cigarettes bring nicotine, tar, carbon monoxide, and other irritants into your body. Your lungs, heart, and blood vessels will be able to work better without these poisons. There are many different ways to quit smoking. Nicotine gum, nicotine patches, a nicotine inhaler, or nicotine nasal spray can help with physical craving. Hypnosis, support groups, and medicines help break the habit of smoking. WHAT THINGS CAN I DO TO MAKE QUITTING EASIER?  Here are some tips to help you quit for good:  Pick a date when you will quit smoking completely. Tell all of your friends and family about your plan to quit on that date.  Do not try to slowly cut down on the number of cigarettes you are smoking. Pick a quit date and quit smoking completely starting on that day.  Throw away all cigarettes.   Clean and remove all ashtrays from your home, work, and car.  On a card, write down your reasons for quitting. Carry the card with you and read it when you get the urge to smoke.  Cleanse your body of nicotine. Drink enough water and fluids to keep your urine clear or pale yellow. Do this after quitting to flush the nicotine from your body.  Learn to predict your moods. Do not let a bad situation be your excuse to have a cigarette. Some situations in your life might tempt you into wanting a cigarette.  Never have "just one" cigarette. It leads to wanting another and another. Remind yourself of your decision to quit.  Change habits associated with smoking. If you smoked while driving or when feeling stressed, try other activities to replace smoking. Stand up when drinking your coffee. Brush your teeth after eating. Sit in a different chair when you read the paper. Avoid alcohol while trying to quit, and try to drink fewer caffeinated beverages. Alcohol and caffeine may urge you to  smoke.  Avoid foods and drinks that can trigger a desire to smoke, such as sugary or spicy foods and alcohol.  Ask people who smoke not to smoke around you.  Have something planned to do right after eating or having a cup of coffee. For example, plan to take a walk or exercise.  Try a relaxation exercise to calm you down and decrease your stress. Remember, you may be tense and nervous for the first 2 weeks after you quit, but this will pass.  Find new activities to keep your hands busy. Play with a pen, coin, or rubber band. Doodle or draw things on paper.  Brush your teeth right after eating. This will help cut down on the craving for the taste of tobacco after meals. You can also try mouthwash.   Use oral substitutes in place of cigarettes. Try using lemon drops, carrots, cinnamon sticks, or chewing gum. Keep them handy so they are available when you have the urge to smoke.  When you have the urge to smoke, try deep breathing.  Designate your home as a nonsmoking area.  If you are a heavy smoker, ask your health care provider about a prescription for nicotine chewing gum. It can ease your withdrawal from nicotine.  Reward yourself. Set aside the cigarette money you save and buy yourself something nice.  Look for support from others. Join a support group or smoking cessation program. Ask someone at home or at work to help you with your plan   to quit smoking.  Always ask yourself, "Do I need this cigarette or is this just a reflex?" Tell yourself, "Today, I choose not to smoke," or "I do not want to smoke." You are reminding yourself of your decision to quit.  Do not replace cigarette smoking with electronic cigarettes (commonly called e-cigarettes). The safety of e-cigarettes is unknown, and some may contain harmful chemicals.  If you relapse, do not give up! Plan ahead and think about what you will do the next time you get the urge to smoke. HOW WILL I FEEL WHEN I QUIT SMOKING? You  may have symptoms of withdrawal because your body is used to nicotine (the addictive substance in cigarettes). You may crave cigarettes, be irritable, feel very hungry, cough often, get headaches, or have difficulty concentrating. The withdrawal symptoms are only temporary. They are strongest when you first quit but will go away within 10-14 days. When withdrawal symptoms occur, stay in control. Think about your reasons for quitting. Remind yourself that these are signs that your body is healing and getting used to being without cigarettes. Remember that withdrawal symptoms are easier to treat than the major diseases that smoking can cause.  Even after the withdrawal is over, expect periodic urges to smoke. However, these cravings are generally short lived and will go away whether you smoke or not. Do not smoke! WHAT RESOURCES ARE AVAILABLE TO HELP ME QUIT SMOKING? Your health care provider can direct you to community resources or hospitals for support, which may include:  Group support.  Education.  Hypnosis.  Therapy.   This information is not intended to replace advice given to you by your health care provider. Make sure you discuss any questions you have with your health care provider.   Document Released: 12/06/2003 Document Revised: 03/30/2014 Document Reviewed: 08/25/2012 Elsevier Interactive Patient Education 2016 [employer]  

## 2015-09-05 NOTE — Progress Notes (Signed)
[address]       [place] [address]             [phone]                         Mancel C Delbene [facility] Medical Record #[id number] Date of Birth: 08-07-1932  [name], * [name], MD  Chief Complaint:   PostOp Follow Up Visit 10/20/2011  DATE OF DISCHARGE:  OPERATIVE REPORT  Preop: Non small cell Lung cancer- left  Postop: same  SURGICAL PROCEDURE: Repeat left Mini thoracotomy, wedge resection, and  segmentectomy of the superior segment of the left lower lobe, and  placement of On-Q device.    pT1b, pNX, MX. INVASIVE MODERATELY DIFFERENTIATED ADENOCARCINOMA, SPANNING 2.2 CM IN GREATEST DIMENSION.   Wedge resection 10/20/2011  Stereotactic radio therapy to left upper lobe lesion in October 2014  Needle BX of left upper lobe lesion: [id number]  ALK negative, EGFR  positive Lung, needle/core biopsy(ies), LUL - POSITIVE FOR ADENOCARCINOMA.  History of Present Illness:     Patient returns to the office today in follow-up followup after recent bronchoscopy ebus and ENB with attempted biopsy of left lower lobe lesion and right lower lobe lesion. He has previous  surgical wedge resection of a T1 B. moderately differentiated adenocarcinoma the lung 2 cm in size located in the superior segment of the left lower lobe 10/20/2011. The patient previously had full left thoracotomy many years ago for "cyst".    An separate  enlarging lesion in the left upper lobe was biopsy proven adenocarcinoma and he underwent stereotactic radiotherapy in the fall of 2014  Follow up  follow-up PET scan of the chest was done because of new and enlarging lung lesions. Leading to biopsy /bronchoscopy two days ago   He notes that over the past 2-3 months he's had additional weight loss thinks up to 13 pounds. He's had no fever chills. No night sweats He says he has no appetite. His daughter notes that he continues to drink beer on a daily basis and  continues smoking.    History  Smoking status  . Current Some Day Smoker -- 0.50 packs/day for 50 years  . Types: Cigarettes  Smokeless tobacco  . Current User  . Types: Chew       Allergies  Allergen Reactions  . Iohexol Shortness Of Breath    PT GETS SOB FROM IV CONTRAST.  HE REQUIRES PREMEDS PER DR. MATTERN   . Penicillins Itching and Rash    "haven't taken any  in 40 years" Has patient had a PCN reaction causing immediate rash, facial/tongue/throat swelling, SOB or lightheadedness with hypotension: NO Has patient had a PCN reaction causing severe rash involving mucus membranes or skin necrosis:NO Has patient had a PCN reaction that required hospitalization NO Has patient had a PCN reaction occurring within the last 10 years: NO If all of the above answers are "NO", then may proceed with Cephalosporin use.    Current Outpatient Prescriptions  Medication Sig Dispense Refill  . albuterol (PROVENTIL HFA;VENTOLIN HFA) 108 (90 BASE) MCG/ACT inhaler Inhale 2 puffs into the lungs every 6 (six) hours as needed for wheezing or shortness of breath.    . escitalopram (LEXAPRO) 20 MG tablet Take 20 mg by mouth daily.    Marland Kitchen lovastatin (MEVACOR) 20 MG tablet Take 20 mg by mouth daily.    . metoprolol tartrate (LOPRESSOR) 25  MG tablet TAKE ONE BY MOUTH TWO TIMES A DAY. 60 tablet 10  . nitroGLYCERIN (NITROSTAT) 0.4 MG SL tablet Place 1 tablet (0.4 mg total) under the tongue every 5 (five) minutes as needed. For chest pain (Patient taking differently: Place 0.4 mg under the tongue every 5 (five) minutes as needed for chest pain. ) 30 tablet 3  . warfarin (COUMADIN) 5 MG tablet Take 2.5-5 mg by mouth daily. Take 1 tablet (5 mg) by mouth 1st day in the morning, then take 1/2 tablet (2.5 mg) 2nd day in the morning, then repeat (or as directed by coumadin clinic)    . zolpidem (AMBIEN) 5 MG tablet Take 5 mg by mouth at bedtime as needed for sleep.     No current facility-administered medications  for this visit.    Wt Readings from Last 3 Encounters:  09/05/15 133 lb (60.328 kg)  09/03/15 133 lb (60.328 kg)  08/29/15 133 lb (60.328 kg)     Physical Exam: BP 158/88 mmHg  Pulse 88  Resp 16  Ht _0  (1.803 m)  Wt 133 lb (60.328 kg)  BMI 18.56 kg/m2  SpO2 98% Patient in sinus rhythm on palpation today General appearance: alert and cooperative Neurologic: intact Heart: regular rate and rhythm, S1, S2 normal, no murmur, click, rub or gallop and normal apical impulse Lungs: clear to auscultation bilaterally and normal percussion bilaterally Abdomen: soft, non-tender; bowel sounds normal; no masses,  no organomegaly Extremities: extremities normal, atraumatic, no cyanosis or edema and Homans sign is negative, no sign of DVT Wounds:well healed.  No cervical or supraclavicular or axillary adenopathy   Diagnostic Studies & Laboratory data:         Recent Radiology Findings: Ct Chest Wo Contrast  08/15/2015  ADDENDUM REPORT: 08/15/2015 12:07 IMPRESSION: 1. Within the superior segment of the right lower lobe there is an enlarging sub solid nodule. This has an equivalent diameter of 1.8 cm. Previously 1.5 cm. This is suspicious for a low-grade indolent neoplasm such as pulmonary adenocarcinoma. 2. Interval development of multiple thin walled cavitary nodules in both lungs. Although nonspecific the appearance is suggestive of pulmonary Langerhans Cell Histiocytosis. Less favored diagnostic considerations include (but are not limited to) multiple septic emboli or of metastatic squamous cell carcinoma. 3. Emphysema 4. Aortic atherosclerosis and LAD coronary artery calcification. Electronically Signed   By: Kerby Moors M.D.   On: 08/15/2015 12:07  08/15/2015  CLINICAL DATA:  History of left lung cancer in 2013.  Former smoker. EXAM: CT CHEST WITHOUT CONTRAST TECHNIQUE: Multidetector CT imaging of the chest was performed following the standard protocol without IV contrast. COMPARISON:   02/07/2015 FINDINGS: Mediastinum: The heart size appears normal. Aortic atherosclerosis noted. Calcification involving the LAD coronary artery noted. There is no mediastinal or hilar adenopathy. There is no axillary or supraclavicular adenopathy. Lungs/Pleura: No pleural fluid identified. Moderate to advanced changes of centrilobular and paraseptal emphysema. The left upper lobe pulmonary nodule is unchanged measuring 4 mm, image 43 of series 4. Sub solid nodule within the posterior right upper lobe measures 1.7 x 1.9 cm and has an equivalent diameter of 1.8 cm, image number 53 of series 4. On the previous exam this had an equivalent diameter of 1.5 cm. In the left lower lobe there is a 8 x 7 mm nodule (8 mm mean diameter) on image 63 of series 4. Previously this had an equivalent diameter of 7 mm. In the right lower low there is a 9 x 7 mm nodule (  8 mm mean diameter) on image 85 of series 4. This is unchanged from previous exam. Multiple new cavitary nodules are scattered throughout both lungs. For example, in the right lower lobe new cavitary nodule measures 7 by 6 mm, mean diameter 7 mm, image 66 of series 4. In the left lower lobe posteriorly there is a new cavitary 6 x 6 mm nodule (6 mm mean diameter) on image 78 of series 4. In the right lower lobe there is a new cavitary nodule measuring 4 x 5 mm nodule (5 mm mean diameter) on image 87 of series 4. Upper Abdomen: There is no acute findings identified within the upper abdomen. The adrenal glands are normal. There are bilateral renal cysts of varying density. These are incompletely characterized without IV contrast. Small nonobstructing calculi are noted within the upper pole of the left kidney. Musculoskeletal: No aggressive lytic or sclerotic bone lesions identified. Degenerative disc disease noted within the thoracic spine. Electronically Signed: By: Signa Kell M.D. On: 08/15/2015 11:04   Nm Pet Image Restag (ps) Skull Base To Thigh  08/21/2015   CLINICAL DATA:  Subsequent Treatment strategy for history lung cancer. Right lower lobe sub solid pulmonary nodule. Multiple thin-walled cavitary nodules. EXAM: NUCLEAR MEDICINE PET SKULL BASE TO THIGH TECHNIQUE: 6.5 mCi F-18 FDG was injected intravenously. Full-ring PET imaging was performed from the skull base to thigh after the radiotracer. CT data was obtained and used for attenuation correction and anatomic localization. FASTING BLOOD GLUCOSE:  Value: 103 mg/dl COMPARISON:  Chest CTs including 08/15/2015.  PET of 04/02/2014 FINDINGS: NECK Left-sided parotid hypermetabolic nodule again identified. This measures 6 mm and a S.U.V. max of 4.2 today. Compare 6 mm and a S.U.V. max of 5.9 on the prior. No cervical nodal hypermetabolism CHEST Low right paratracheal node which measures 9 mm and a S.U.V. max of 4.8 today on image 72/series 4. Compare 9 mm and a S.U.V. max of 3.4 previously. More cephalad right paratracheal node measures 8 mm and a S.U.V. max of 3.3 on image 62/series 4. This node measured similar in size and was not hypermetabolic on the prior exam. Right hilar hypermetabolism is new, including at a S.U.V. max of 4.7. No well-defined adenopathy in this area. The sub solid superior segment right lower lobe pulmonary nodule described on the prior exam measures on the order of 1.6 cm and a S.U.V. max of 1.1 on image 32/series 8. A focus of hypermetabolism about the anterior left pleural space is without well-defined pleural mass. This measures a S.U.V. max of 2.4 on image 86/series 4 and is new. Pleural-parenchymal hypermetabolism at the left apex is favored to be due to scarring, relatively mild. 9 mm left lower lobe pulmonary nodule measures a S.U.V. max of 2.4 on image 38/series 8. This nodule measured 6 mm and was not significantly hypermetabolic on the prior PET (below PET resolution). ABDOMEN/PELVIS No areas of abnormal hypermetabolism. SKELETON No abnormal marrow activity. CT IMAGES PERFORMED FOR  ATTENUATION CORRECTION Left carotid atherosclerosis. No cervical adenopathy. Chest findings deferred to recent diagnostic CT. Ascending aortic dilatation again identified including at 4.1 cm. Advanced centrilobular emphysema. Other scattered pulmonary lesions, including cavitary nodules, are below PET resolution. Normal adrenal glands. Bilateral fluid density renal lesions are likely cysts. There is minimal complexity associated with a partially calcified left renal lesion, similar. Cholecystectomy. Mild prostatomegaly. Bladder wall thickening. Although there is no significant hydronephrosis, a stone is identified at the proximal left ureter measuring 8 mm on image 135/series 4.  Smaller stones are seen in the left renal collecting system. Subtle hyper attenuation is identified at the left renal pelvis on image 130/series 4. remote left rib trauma or surgery. IMPRESSION: 1. The superior segment right lower lobe sub solid pulmonary nodule is not significantly hypermetabolic. As low-grade adenocarcinoma is typically not hypermetabolic, this remains a concern. 2. Increase in low-level hypermetabolism within non pathologically enlarged thoracic nodes. These could be reactive or related to metastatic disease. This could be re-evaluated at followup or 1 or more these nodes could be sampled. 3. A left lower lobe pulmonary nodule has enlarged since the prior PET and demonstrates hypermetabolism. Suspicious for metastatic or metachronous carcinoma. 4. No evidence of extrathoracic metastatic disease. 5. Left-sided stone currently in the proximal left ureter without significant proximal obstruction. Suspect hemorrhage in the left renal pelvis, incompletely evaluated. Depending on clinical concern, dedicated hematuria protocol CT could be performed for further delineation. 6. Prostatomegaly with suspicion of secondary bladder wall thickening/out obstruction. 7. Left parotid nodule is unchanged and likely an indolent primary  neoplasm. Electronically Signed   By: Abigail Miyamoto M.D.   On: 08/21/2015 16:51      Ct Chest Wo Contrast  02/07/2015  CLINICAL DATA:  Left lung cancer 2013, status post surgery and radiation. Prostate cancer 2008. EXAM: CT CHEST WITHOUT CONTRAST TECHNIQUE: Multidetector CT imaging of the chest was performed following the standard protocol without IV contrast. COMPARISON:  08/16/2014 FINDINGS: Mediastinum/Nodes: Upper right paratracheal node 8 mm in short axis on image 20 series 3, stable by my measurement. No pathologic adenopathy is observed. Atherosclerotic aortic arch. Ascending thoracic aorta 4.3 cm diameter. The dual lead pacer noted. Lungs/Pleura: Severe emphysema. Architectural distortion and density at the left lung apex is increased from the prior exam, image 14 series 4 compared image 15 of series 4 of the prior exam. However, the increased density is disorganized, irregular, surrounds varies bulla, and accordingly is difficult to measure. A lot of this increase in density could be inflammatory. Right lower lobe 0.9 by 0.7 cm ground-glass opacity nodule, stable by my measurements from 08/16/2014, but slowly increasing from 08/18/2011. Superior segment right lower lobe sub solid nodule 1.3 by 1.7 cm on image 27 series 4, essentially stable from 08/16/2014 but increased from 08/18/2011. 0.6 by 0.4 cm super segment left lower lobe nodule on image 24 series 4, previously 0.4 by 0.4 cm on 08/16/2014 and perhaps 2 mm in diameter on 08/18/2011. Left lower lobe solid nodule 0.7 by 0.5 cm on image 35 series 4, stable by my measurements on 08/16/2014 and previously measuring 0.5 by 0.4 cm on 08/18/2011. Upper abdomen: Cholecystectomy. Hypodense lesions of both renal upper poles favoring cysts given the photopenia on PET-CT. Musculoskeletal: Thoracic spondylosis. IMPRESSION: 1. Increase in architectural distortion and primarily interstitial density at the left lung apex, possibly from radiation therapy or  inflammatory process, less likely to be locally recurrent malignancy. Merits observation. 2. Several ground-glass nodules and several small left-sided solid nodules are present in the lungs. These are stable from 08/16/2014 but for the most part have enlarged compared to 08/18/11. Multi focal low grade adenocarcinoma is certainly not excluded based on these observations. These have not been appreciably hypermetabolic on PET-CT. 3. Aneurysmal ascending aorta. Recommend annual imaging followup by CTA or MRA. This recommendation follows 2010 ACCF/AHA/AATS/ACR/ASA/SCA/SCAI/SIR/STS/SVM Guidelines for the Diagnosis and Management of Patients with Thoracic Aortic Disease. Circulation. 2010; 121: N562-Z308 4. Severe emphysema. Electronically Signed   By: Van Clines M.D.   On: 02/07/2015 10:41  Ct Chest Wo Contrast  08/16/2014   CLINICAL DATA:  Left lower lobe lung cancer restaging  EXAM: CT CHEST WITHOUT CONTRAST  TECHNIQUE: Multidetector CT imaging of the chest was performed following the standard protocol without IV contrast.  COMPARISON:  05/18/2014 CT abdomen/ pelvis, most recent chest CT 12/28/2013  FINDINGS: Mediastinum/Nodes: Right-sided pacer in place. Ascending aortic ectasia measuring 4.2 cm image 33 again noted.  Heart size is normal. High pretracheal lymph node measuring 0.7 cm stable. No new hilar or mediastinal lymphadenopathy. No pericardial effusion.  Lungs/Pleura: Emphysematous changes are noted. Nodular ground-glass opacity right lower lobe measuring 0.8 cm image 47 is stable. 3 mm left upper lobe pulmonary nodule image 26 is stable. Subpleural nodularity predominantly in the left upper and lower lobes is subjectively stable. Stable left lower lobe 0.5 cm nodule image 38. Left upper lobe/perihilar linear presumed radiation fibrosis/scarring is stable. No pleural effusion.  Upper abdomen: Bilateral renal cortical cysts are again noted, partly visualized.  Musculoskeletal: No lytic or sclerotic  osseous lesion. No acute osseous abnormality. No compression deformity.  IMPRESSION: No significant change in bilateral pulmonary nodules or presumed post treatment change without acute abnormality or evidence for intrathoracic recurrence or metastasis.   Electronically Signed   By: Conchita Paris M.D.   On: 08/16/2014 09:48   I have independently reviewed the above radiology studies  and reviewed the findings with the patient.     PET:04/02/2014 CLINICAL DATA: Subsequent treatment strategy for restaging of lung cancer.  EXAM: NUCLEAR MEDICINE PET SKULL BASE TO THIGH  TECHNIQUE: 7.8 mCi F-18 FDG was injected intravenously. Full-ring PET imaging was performed from the skull base to thigh after the radiotracer. CT data was obtained and used for attenuation correction and anatomic localization.  FASTING BLOOD GLUCOSE: Value: 85 mg/dl  COMPARISON: PET of 12/01/2012. Most recent chest CT 12/28/2013.  FINDINGS: NECK  Hypermetabolism which corresponds to a posterior left parotid nodule. This measures 6 mm in a S.U.V. max of 5.9 on image 30. On the prior PET of 12/01/2012, this measured similar in size and was hypermetabolic.  No hypermetabolic cervical nodes.  CHEST  Mild hypermetabolism corresponding to left apical pleural parenchymal thickening. A left lower lobe vague 6 mm nodule corresponds to low level, non malignant range hypermetabolism. This measures a S.U.V. max of 0.8 on image 41. This nodule was similar in size on the prior CT and has been present back to 12/01/2012.  Low right paratracheal node measures 9 mm and a S.U.V. max of 3.4 on image 76. This node measured similar in size back to 12/01/2012.  ABDOMEN/PELVIS  No areas of abnormal hypermetabolism.  SKELETON  Facet arthropathy involving the left upper cervical spine. Concurrent hypermetabolism. No suspicious osseous abnormalities.  CT IMAGES PERFORMED FOR ATTENUATION  CORRECTION  Cerebral atrophy. No cervical adenopathy. Presumed sebaceous cyst about the posterior right neck at 5.6 cm.  Mild cardiomegaly. Ascending aorta upper normal in size, 3.8 cm. Similar. Aortic and branch vessel atherosclerosis. Pacer. Centrilobular emphysema. Right lower lobe 7 mm ground-glass nodule on image 48, similar. 4 mm left lower lobe nodule on image 27, similar.  Normal adrenal glands. Bilateral renal cysts. Bilateral nephrolithiasis. 11 mm hyper attenuating left renal lesion on image 135 is unchanged back to 12/01/2012. Cholecystectomy. Right common iliac artery dilatation is similar at 1.6 cm. Mild prostatomegaly. Right hydrocele. Left thoracotomy changes.  IMPRESSION: 1. A right paratracheal node is mildly hypermetabolic, but similar in size back to 2014. Favored to be reactive. 2. Otherwise, no  evidence of hypermetabolic recurrent or metastatic disease. 3. Left lower lobe nodule which demonstrates low level, non malignant range hypermetabolism. Given stability back to 2014, likely benign. 4. Incidental findings, including borderline ascending aortic dilatation, probable left renal complex cyst, and right common iliac artery dilatation. 5. Subtle hypermetabolic nodule in the left parotid gland has been present back to 2014. Although this could represent a primary parotid neoplasm, stability suggests relative indolent behavior. This could be re-evaluated on follow-up or further characterized with contrast and neck CT.   Electronically Signed  By: Abigail Miyamoto M.D.  On: 04/02/2014 12:58 Ct Chest Wo Contrast  12/28/2013   CLINICAL DATA:  Patient with history of lung cancer in 2013. Recurrence in 2014. Cough, mild shortness of breath.  EXAM: CT CHEST WITHOUT CONTRAST  TECHNIQUE: Multidetector CT imaging of the chest was performed following the standard protocol without IV contrast.  COMPARISON:  Chest CT 06/16/2013  FINDINGS: Pacer apparatus within the  right anterior hemi thorax. Normal heart size. No pericardial effusion. No mediastinal or hilar adenopathy. Stable prominent sub cm mediastinal lymph nodes. Calcified atherosclerotic plaque involving the thoracic aorta.  Central airways are patent. Extensive diffuse centrilobular emphysema. Stable 8 mm right lower lobe pulmonary nodule. Stable 9 mm right upper lobe ground-glass nodule (image 23; series 4). Stable 5 mm left lower lobe pulmonary nodule (image 31; series 4). Stable left upper lobe suture line.  Incidental imaging of the upper abdomen demonstrate postcholecystectomy changes. Bilateral renal cysts. Normal adrenal glands. No aggressive or acute appearing osseous lesions.  IMPRESSION: Stable CT of the chest.  No change in pulmonary nodules.  Emphysema   Electronically Signed   By: Lovey Newcomer M.D.   On: 12/28/2013 13:18  Ct Chest Wo Contrast  08/28/2013   CLINICAL DATA:  Followup lung cancer  EXAM: CT CHEST WITHOUT CONTRAST  TECHNIQUE: Multidetector CT imaging of the chest was performed following the standard protocol without IV contrast.  COMPARISON:  06/16/2013  FINDINGS: No pleural effusion identified. Moderate to advanced changes of centrilobular emphysema identified. Pulmonary nodule within the right lower lobe is stable measuring 8 mm, image 44/series 5. Pulmonary nodule within the left lower lobe is stable measuring 5 mm, image 36/series 5. Peripheral scar like densities within the left lower lobe appear stable, image 31/series 5. Stable appearance of left upper lobe posterior suture line.  The heart size appears normal. There is no pericardial effusion. No mediastinal or hilar adenopathy. Calcified atherosclerotic disease involves the thoracic aorta.  Incidental imaging through the upper abdomen is significant for bilateral renal cysts. The adrenal glands appear normal. Patient is status post cholecystectomy.  Review of the visualized bony structures is negative for aggressive lytic or sclerotic  bone lesion.  IMPRESSION: 1. Stable CT of the chest. 2. No change in bilateral pulmonary nodules. 3. Atherosclerotic disease. 4. Emphysema.   Electronically Signed   By: Kerby Moors M.D.   On: 08/28/2013 14:27   Ct Chest Wo Contrast  06/16/2013   CLINICAL DATA:  Lung cancer. Prior left upper lobe resection. Right lung cancer with radiation. History of prostate cancer.  EXAM: CT CHEST WITHOUT CONTRAST  TECHNIQUE: Multidetector CT imaging of the chest was performed following the standard protocol without IV contrast.  COMPARISON:  02/27/2013  FINDINGS: Severe emphysematous changes in the lungs. Postsurgical changes in the posterior superior left upper lobe. 12 mm nodular area in the left apex, stable. Biapical scarring. Right lower lobe nodule on image 42 measures 8 mm, stable. Subpleural densities noted  in the left lateral hemithorax are unchanged. No pleural effusions. No new or enlarging pulmonary nodules. Small nodule in the left lower lobe on image 36 measures 5 mm, stable.  Pacer is noted in place with leads in the right atrium and right ventricle, stable. Heart is normal size. Ascending aortic borderline in diameter at 3.9 cm. Small scattered mediastinal lymph nodes, none pathologically enlarged or changed since prior study. Chest wall soft tissues are unremarkable.  Large subcutaneous low-density lesion is noted posteriorly in the right posterior shoulder measuring approximately 6.2 cm, stable since PET CT. This presumably represents a large sebaceous cyst.  IMPRESSION: Stable postoperative changes in the posterior superior left upper lobe. Stable bilateral pulmonary nodules. No new or enlarging pulmonary nodules.  Mild slight dilatation of the ascending thoracic aorta measuring 4.0 cm.   Electronically Signed   By: Rolm Baptise M.D.   On: 06/16/2013 13:11  Ct Angio Head W/cm &/or Wo Cm  11/13/2012   *RADIOLOGY REPORT*  Clinical Data:  Numbness all over.  History of bilateral ICA pseudoaneurysms.   Headache.  THE PATIENT WAS PREMEDICATED WITH IV STEROIDS BECAUSE OF THE HISTORY OF CONTRAST ALLERGY.  THERE WERE NO ADVERSE EVENTS.  CT ANGIOGRAPHY HEAD AND NECK  Technique:  Multidetector CT imaging of the head and neck was performed using the standard protocol during bolus administration of intravenous contrast.  Multiplanar CT image reconstructions including MIPs were obtained to evaluate the vascular anatomy. Carotid stenosis measurements (when applicable) are obtained utilizing NASCET criteria, using the distal internal carotid diameter as the denominator.  Contrast: 27m OMNIPAQUE IOHEXOL 350 MG/ML SOLN  Comparison:  08/25/2011 CT angio neck.  10/03/2008 CT angio head.  CTA NECK  Findings:  Right chest cardiac pacemaker.  COPD.  Spiculated left upper lobe lesion is new from priors measuring 11 x 15 mm.  In the setting of chronic of tobacco abuse, concern raised for pulmonary neoplasm.  Previously identified and biopsied, spiculated lesion superior segment left lower lobe lesion, not well seen on today's examination.  Stable subcentimeter thyroid nodules.  Negative larynx.  Advanced spondylosis.  Three-vessel arch configuration without proximal stenosis.  Unremarkable right common carotid artery.  Mild nonstenotic posterior wall plaque.  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  No stenosis.  No significant left or right vertebral artery pathology.  Left vertebral dominant.  Unremarkable left common carotid artery.  Posterior wall plaque is non stenotic in the proximal left internal carotid artery. Rim calcified predominately thrombosed 7 x 7 x 8 mm more inferior cervical ICA aneurysm projecting medially is unchanged.  More superiorly, a  partially thrombosed and calcified skull base cervical ICA aneurysm measuring 8 x 10 x 11 mm is also stable. Lateral thrombus extends cephalad to the  level of the horizontal petrous/foramen lacerum level.  No ICA dissection.   Review of the MIP images confirms the above findings.  IMPRESSION:  Stable extracranial atherosclerotic change and stable bilateral cervical ICA pseudoaneurysms.  No evidence for enlargement, the dissection, or neck hemorrhage.  New spiculated left upper lobe 11 x 15 mm lesion; in the setting of COPD cannot exclude squamous cell carcinoma. As the patient has been premedicated, CT chest will be performed shortly with an additional 50 ml of contrast for expeditious  evaluation of the chest and mediastinum given his contrast allergy. Findings discussed with EDP.  CTA HEAD  Findings:  There is no evidence for acute infarction, intracranial hemorrhage, mass lesion, hydrocephalus, or extra-axial fluid.  Mild atrophy.  Mild white matter disease.  Post infusion, no abnormal enhancement.  Intact calvarium.  The  intracranial vasculature is dolichoectatic but patent.  No intracranial dissection or berry aneurysm is present.  The left vertebral is dominant.  There is no proximal vascular occlusion or significant flow reducing lesion. Similar appearance to priors.   Review of the MIP images confirms the above findings.  IMPRESSION: Unremarkable CTA head.   Original Report Authenticated By: Rolla Flatten, M.D.   Ct Angio Neck W/cm &/or Wo/cm  11/13/2012   *RADIOLOGY REPORT*  Clinical Data:  Numbness all over.  History of bilateral ICA pseudoaneurysms.  Headache.  THE PATIENT WAS PREMEDICATED WITH IV STEROIDS BECAUSE OF THE HISTORY OF CONTRAST ALLERGY.  THERE WERE NO ADVERSE EVENTS.  CT ANGIOGRAPHY HEAD AND NECK  Technique:  Multidetector CT imaging of the head and neck was performed using the standard protocol during bolus administration of intravenous contrast.  Multiplanar CT image reconstructions including MIPs were obtained to evaluate the vascular anatomy. Carotid stenosis measurements (when applicable) are obtained utilizing NASCET criteria,  using the distal internal carotid diameter as the denominator.  Contrast: 52m OMNIPAQUE IOHEXOL 350 MG/ML SOLN  Comparison:  08/25/2011 CT angio neck.  10/03/2008 CT angio head.  CTA NECK  Findings:  Right chest cardiac pacemaker.  COPD.  Spiculated left upper lobe lesion is new from priors measuring 11 x 15 mm.  In the setting of chronic of tobacco abuse, concern raised for pulmonary neoplasm.  Previously identified and biopsied, spiculated lesion superior segment left lower lobe lesion, not well seen on today's examination.  Stable subcentimeter thyroid nodules.  Negative larynx.  Advanced spondylosis.  Three-vessel arch configuration without proximal stenosis.  Unremarkable right common carotid artery.  Mild nonstenotic posterior wall plaque.  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  No stenosis.  No significant left or right vertebral artery pathology.  Left vertebral dominant.  Unremarkable left common carotid artery.  Posterior wall plaque is non stenotic in the proximal left internal carotid artery. Rim calcified predominately thrombosed 7 x 7 x 8 mm more inferior cervical ICA aneurysm projecting medially is unchanged.  More superiorly, a  partially thrombosed and calcified skull base cervical ICA aneurysm measuring 8 x 10 x 11 mm is also stable. Lateral thrombus extends cephalad to the level of the horizontal petrous/foramen lacerum level.  No ICA dissection.   Review of the MIP images confirms the above findings.  IMPRESSION:  Stable extracranial atherosclerotic change and stable bilateral cervical ICA pseudoaneurysms.  No evidence for enlargement, the dissection, or neck hemorrhage.  New spiculated left upper lobe 11 x 15 mm lesion; in the setting of COPD cannot exclude squamous cell carcinoma. As the patient has been premedicated, CT chest will be performed shortly with an  additional 50 ml of contrast for expeditious  evaluation of the chest and mediastinum given his contrast allergy. Findings discussed with EDP.  CTA HEAD  Findings:  There is no evidence for acute infarction, intracranial hemorrhage, mass lesion, hydrocephalus, or extra-axial fluid.  Mild atrophy.  Mild white matter disease.  Post infusion, no abnormal enhancement.  Intact calvarium.  The  intracranial vasculature is dolichoectatic but patent.  No intracranial dissection or berry aneurysm is present.  The left vertebral is dominant.  There is no proximal vascular occlusion or significant flow reducing lesion. Similar appearance to priors.   Review of the MIP images confirms the above findings.  IMPRESSION: Unremarkable CTA head.   Original Report Authenticated By: Rolla Flatten, M.D.   Ct Chest W Contrast  11/22/2012   *RADIOLOGY REPORT*  Clinical Data: Shortness of breath, weakness, abnormality on recently performed neck CTA.  CT CHEST WITH CONTRAST  Technique:  Multidetector CT imaging of the chest was performed following the standard protocol during bolus administration of intravenous contrast.  Contrast: 60m OMNIPAQUE IOHEXOL 300 MG/ML  SOLN  Comparison: Neck CT head - earlier same day; chest CT - 07/14/2012; 09/03/2011  Findings:  Stable postsurgical change of the superior segment of the left lower lobe.  While the external diameter of the previous identified approximately 1.3 x 1.0 cm nodule within the left lung apex (image 13, series 3) is grossly unchanged, the nodule appears more consolidated on the present examination, with slight differences possibly attributable to slice selection.  Additional approximately 8 mm ground-glass nodule within the right lower lobe (image 43, series 3) appears grossly unchanged, as does an approximately 5 mm ground-glass nodule within the superior segment of the lingula (image 34, series 3).  No new discrete pulmonary nodules.  Scattered shoddy mediastinal lymph nodes appear  grossly unchanged and are individually not enlarged by size criteria with index pretracheal node measuring 9 mm in short axis diameter (image 23, series 2). No definite hilar or axillary lymphadenopathy.  Advanced centrilobular emphysematous change.  Minimal dependent subsegmental atelectasis.  No focal airspace opacity.  No pleural effusion or pneumothorax.  The central pulmonary airways are patent.  Normal heart size.  Right anterior chest wall dual lead pacemaker. No pericardial effusion.  Normal caliber of the main pulmonary artery. Grossly unchanged mild ectasia of the ascending thoracic aorta measuring approximately 4.1 cm in greatest oblique short axis diameter (image 30, series 2).  Conventional configuration of the aortic arch.  Scattered minimal atherosclerotic plaque within the thoracic aorta.  No definite thoracic aortic dissection or periaortic stranding.  Limited evaluation of the upper abdomen demonstrates a partially exophytic approximately 3.0 cm cyst arising from the posterior superior aspect of the right kidney.  Several additional smaller renal cysts are noted bilaterally.  Post cholecystectomy.  No acute or aggressive osseous abnormalities.  IMPRESSION:  1.  No change to minimal increase in the indeterminate approximately 1.3 cm nodule within the left lung apex, with slight differences in possibly attributable to slice selection.  While possibly representing of scar, given advanced emphysematous change, a slowly growing lung cancer is not excluded.  As such, further evaluation with a follow-up chest CT in 3 months is recommended.  2.  Grossly unchanged bilateral sub centimeter ground-glass nodules, likely too small to accurately characterize.  Continued attention on follow-up is recommended.  3.  Stable postsurgical change of the superior segment of the left lower lobe.  4.  Grossly unchanged mild fusiform ectasia of the descending thoracic aorta measuring approximately 41 mm in diameter.  Above  findings discussed with Dr. AZenia Residesat the time of examination completion.   Original Report Authenticated By: JJake Seats MD   Ct Chest Wo Contrast  07/14/2012  *RADIOLOGY REPORT*  Clinical Data: Follow-up lung cancer.  CT CHEST WITHOUT CONTRAST  Technique:  Multidetector CT imaging of the chest was performed following the standard protocol without IV contrast.  Comparison: CT scan 08/18/2011.  Findings: The chest wall is stable.  A permanent right-sided  pacemaker is noted.  No supraclavicular or axillary lymphadenopathy.  The bony thorax is intact.  No destructive bone lesions or spinal canal compromise.  Moderate degenerative changes involving the spine.  The heart is normal in size.  No pericardial effusion.  No mediastinal or hilar lymphadenopathy.  Stable tortuosity and ectasia of the thoracic aorta.  The esophagus is grossly normal.  Examination of the lung parenchyma demonstrates stable advanced emphysematous changes and pulmonary interstitial scarring.  The left upper lobe pulmonary nodule has been excised.  No findings for recurrent or residual tumor.  There is a new left apical lung density.  This could reflect progressive scarring change but does need observation.  There is a 7 mm nodule at the right lung base.  This is difficult to see on the prior CT but was a on the prior PET CT and needs continued observation.  There is an ill-defined nodular density measuring approximate 5 mm in the superior segment of the left lower lobe on image number 31. I believe this was present on the prior CT scan although it is more obvious now.  A 4 mm nodule is noted on image number 22 in the left upper lobe. This was present on the prior study and has not significantly changed.  No acute pulmonary findings or pleural effusion.  The upper abdomen is unremarkable.  Stable right renal cyst.  IMPRESSION:  1.  Surgical changes from prior excision of a left apical lung neoplasm. 2.  Advanced emphysematous changes and  pulmonary scarring. 3.  New 13 x 8 mm left apical lung density.  This could reflect scarring change but needs close observation. 4.  Other small pulmonary nodules as discussed above.  Recommend continued observation. 5.  No acute pulmonary findings and no mediastinal or hilar lymphadenopathy. 6.  Stable tortuosity and ectasia of the thoracic aorta.   Original Report Authenticated By: Marijo Sanes, M.D.       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 Completed 01/03/13  EGFR positive Several ground-glass nodules and several small left-sided solid nodules are present in the lungs. These are stable from 08/16/2014 but for the most part have enlarged compared to 08/18/11.  The patient continues to have multiple small bilateral nodules some which have changed slightly and others that are unchanged. With his previous history of 2 separate lung malignancies in in the left lung In the suspicious nature of the current lesions  PET scan done, the 7 mm left lower lobe lung nodule was hypermetabolic, a right lower lobe groundglass opacity is not. Recent ebus and navigation bronchoscopy both to the left and right  lower lobe lesions revealed no definitive  diagnosis of recurrent malignancy however the patient's progression on CT scan and PET scan is very worrisome for multicentric adenocarcinoma. Thepatient's xrays  and path were presented in the multidisciplinary thoracic oncology conference today, the patient's been referred to medical oncology to consider treatment considering the previous biopsy that was EGRF positive. The films were reviewed by interventional radiology ,  Although the left lung lesion is a small lesion,needle biopsy of the left lower lobe lesion could be attempted, the patient's had 2 previous thoracotomies on the left  so the risk of left pneumothorax is decreased some.  We will order a CT of the brain to rule out metastatic disease, the patient has some dizziness she attributes to his Lopressor. He has a pacemaker in place so we will not do a MRI of the brain  Grace Isaac 09/05/2015 12:54 PM

## 2015-09-11 ENCOUNTER — Other Ambulatory Visit: Payer: Self-pay | Admitting: *Deleted

## 2015-09-11 DIAGNOSIS — Z91041 Radiographic dye allergy status: Secondary | ICD-10-CM

## 2015-09-11 MED ORDER — PREDNISONE 10 MG (21) PO TBPK: 10.0000 mg | ORAL_TABLET | Freq: Every day | ORAL | Status: AC

## 2015-09-11 MED ORDER — DIPHENHYDRAMINE HCL 25 MG PO CAPS: 50.0000 mg | ORAL_CAPSULE | Freq: Once | ORAL | Status: DC

## 2015-09-16 ENCOUNTER — Other Ambulatory Visit: Payer: Medicare Other

## 2015-09-18 ENCOUNTER — Ambulatory Visit (HOSPITAL_COMMUNITY)
Admission: RE | Admit: 2015-09-18 | Discharge: 2015-09-18 | Disposition: A | Discharge status: Home / Self Care | Payer: Medicare Other | Source: Ambulatory Visit | Attending: Cardiothoracic Surgery | Admitting: Cardiothoracic Surgery

## 2015-09-18 ENCOUNTER — Ambulatory Visit (INDEPENDENT_AMBULATORY_CARE_PROVIDER_SITE_OTHER): Payer: Medicare Other | Admitting: *Deleted

## 2015-09-18 ENCOUNTER — Encounter: Payer: Self-pay | Admitting: Internal Medicine

## 2015-09-18 DIAGNOSIS — G319 Degenerative disease of nervous system, unspecified: Secondary | ICD-10-CM | POA: Diagnosis not present

## 2015-09-18 DIAGNOSIS — Z950 Presence of cardiac pacemaker: Secondary | ICD-10-CM | POA: Diagnosis not present

## 2015-09-18 DIAGNOSIS — I7090 Unspecified atherosclerosis: Secondary | ICD-10-CM | POA: Insufficient documentation

## 2015-09-18 DIAGNOSIS — C3412 Malignant neoplasm of upper lobe, left bronchus or lung: Secondary | ICD-10-CM | POA: Diagnosis present

## 2015-09-18 DIAGNOSIS — I739 Peripheral vascular disease, unspecified: Secondary | ICD-10-CM | POA: Insufficient documentation

## 2015-09-18 DIAGNOSIS — C7931 Secondary malignant neoplasm of brain: Secondary | ICD-10-CM

## 2015-09-18 MED ORDER — IOPAMIDOL (ISOVUE-300) INJECTION 61%
75.0000 mL | Freq: Once | INTRAVENOUS | Status: AC | PRN
  Administered 2015-09-18: 75 mL via INTRAVENOUS

## 2015-09-18 NOTE — Progress Notes (Signed)
Pacemaker check in clinic. Normal device function. Thresholds, sensing, impedances consistent with previous measurements. Device programmed to maximize longevity. 284 mode siwthces, all EGMs show atrial tach. 10 VHR episodes, all EGMs show atrial high rates with 1:1 conduction. Device programmed at appropriate safety margins. Histogram distribution appropriate for patient activity level. Device programmed to optimize intrinsic conduction. Estimated longevity 4.3-10.34yr. Remote check 9/27 and ROV with AS 03/2016

## 2015-09-19 ENCOUNTER — Encounter: Payer: Self-pay | Admitting: Cardiothoracic Surgery

## 2015-09-19 ENCOUNTER — Ambulatory Visit (INDEPENDENT_AMBULATORY_CARE_PROVIDER_SITE_OTHER): Payer: Medicare Other | Admitting: Cardiothoracic Surgery

## 2015-09-19 VITALS — BP 149/82 | HR 105 | Resp 16 | Ht 71.0 in | Wt 133.0 lb

## 2015-09-19 DIAGNOSIS — Z9889 Other specified postprocedural states: Secondary | ICD-10-CM

## 2015-09-19 DIAGNOSIS — C3492 Malignant neoplasm of unspecified part of left bronchus or lung: Secondary | ICD-10-CM

## 2015-09-19 NOTE — Progress Notes (Signed)
    [address]       [place] [address]             [phone]                         [name] Record #[id number] Date of Birth: 10/13/1932  [name], [name] Oliver, * [name],[name] OLIVER, MD  Chief Complaint:   PostOp Follow Up Visit 10/20/2011  DATE OF DISCHARGE:  OPERATIVE REPORT  Preop: Non small cell Lung cancer- left  Postop: same  SURGICAL PROCEDURE: Repeat left Mini thoracotomy, wedge resection, and  segmentectomy of the superior segment of the left lower lobe, and  placement of On-Q device.    pT1b, pNX, MX. INVASIVE MODERATELY DIFFERENTIATED ADENOCARCINOMA, SPANNING 2.2 CM IN GREATEST DIMENSION.   Wedge resection 10/20/2011  Stereotactic radio therapy to left upper lobe lesion in October 2014  Needle BX of left upper lobe lesion: [id number]  ALK negative, EGFR  positive Lung, needle/core biopsy(ies), LUL - POSITIVE FOR ADENOCARCINOMA.  History of Present Illness:     Patient returns to the office today in follow-up followup after recent bronchoscopy ebus and ENB with attempted biopsy of left lower lobe lesion and right lower lobe lesion. He has previous  surgical wedge resection of a T1 B. moderately differentiated adenocarcinoma the lung 2 cm in size located in the superior segment of the left lower lobe 10/20/2011. The patient previously had full left thoracotomy many years ago for "cyst".    An separate  enlarging lesion in the left upper lobe was biopsy proven adenocarcinoma and he underwent stereotactic radiotherapy in the fall of 2014  Follow up  follow-up PET scan of the chest was done because of new and enlarging lung lesions. Leading to biopsy /bronchoscopy 2 weeks ago but without any definitive pathologic diagnosis.   He notes that over the past 2-3 months he's had additional weight loss thinks up to 13 pounds. He's had no fever chills. No night sweats He says he has no appetite. His daughter notes that he  continues to drink beer on a daily basis and continues smoking. Since stopping his Lopressor he notes his dizziness has markedly improved.    History  Smoking status  . Current Some Day Smoker -- 0.50 packs/day for 50 years  . Types: Cigarettes  Smokeless tobacco  . Current User  . Types: Chew       Allergies  Allergen Reactions  . Iohexol Shortness Of Breath    PT GETS SOB FROM IV CONTRAST.  HE REQUIRES PREMEDS PER DR. MATTERN   . Penicillins Itching and Rash    "haven't taken any  in 40 years" Has patient had a PCN reaction causing immediate rash, facial/tongue/throat swelling, SOB or lightheadedness with hypotension: NO Has patient had a PCN reaction causing severe rash involving mucus membranes or skin necrosis:NO Has patient had a PCN reaction that required hospitalization NO Has patient had a PCN reaction occurring within the last 10 years: NO If all of the above answers are "NO", then may proceed with Cephalosporin use.    Current Outpatient Prescriptions  Medication Sig Dispense Refill  . albuterol (PROVENTIL HFA;VENTOLIN HFA) 108 (90 BASE) MCG/ACT inhaler Inhale 2 puffs into the lungs every 6 (six) hours as needed for wheezing or shortness of breath. Reported on 09/05/2015    . escitalopram (LEXAPRO) 20 MG tablet Take 20 mg by mouth daily.    .   lovastatin (MEVACOR) 20 MG tablet Take 20 mg by mouth daily.    . nitroGLYCERIN (NITROSTAT) 0.4 MG SL tablet Place 1 tablet (0.4 mg total) under the tongue every 5 (five) minutes as needed. For chest pain 30 tablet 3  . warfarin (COUMADIN) 5 MG tablet Take 2.5-5 mg by mouth daily. Take 1 tablet (5 mg) by mouth 1st day in the morning, then take 1/2 tablet (2.5 mg) 2nd day in the morning, then repeat (or as directed by coumadin clinic)    . zolpidem (AMBIEN) 5 MG tablet Take 5 mg by mouth at bedtime as needed for sleep.    . metoprolol tartrate (LOPRESSOR) 25 MG tablet TAKE ONE BY MOUTH TWO TIMES A DAY. (Patient not taking: Reported  on 09/18/2015) 60 tablet 10   No current facility-administered medications for this visit.    Wt Readings from Last 3 Encounters:  09/19/15 133 lb (60.328 kg)  09/05/15 134 lb 9.6 oz (61.054 kg)  09/05/15 133 lb (60.328 kg)     Physical Exam: BP 149/82 mmHg  Pulse 105  Resp 16  Ht 5' 11" (1.803 m)  Wt 133 lb (60.328 kg)  BMI 18.56 kg/m2  SpO2 98% Patient in sinus rhythm on palpation today General appearance: alert and cooperative Neurologic: intact Heart: regular rate and rhythm, S1, S2 normal, no murmur, click, rub or gallop and normal apical impulse Lungs: clear to auscultation bilaterally and normal percussion bilaterally Abdomen: soft, non-tender; bowel sounds normal; no masses,  no organomegaly Extremities: extremities normal, atraumatic, no cyanosis or edema and Homans sign is negative, no sign of DVT Wounds:well healed.  No cervical or supraclavicular or axillary adenopathy   Diagnostic Studies & Laboratory data:         Recent Radiology Findings: Ct Head W Wo Contrast  09/18/2015  CLINICAL DATA:  Metastatic lung cancer. Dizziness and occasional headaches. Weight loss over the last several months. EXAM: CT HEAD WITHOUT AND WITH CONTRAST TECHNIQUE: Contiguous axial images were obtained from the base of the skull through the vertex without and with intravenous contrast CONTRAST:  56m ISOVUE-300 IOPAMIDOL (ISOVUE-300) INJECTION 61% COMPARISON:  10/23/2013 FINDINGS: The brain shows generalized age related atrophy. There are chronic small-vessel ischemic changes of the deep white matter. No cortical or large vessel territory infarction. No mass lesion, hemorrhage, hydrocephalus or extra-axial collection. No abnormal contrast enhancement. No skull or skullbase lesion. Visualized sinuses are clear. There is atherosclerotic calcification of the major vessels at the base of the brain. This includes calcification of the distal cervical internal carotid artery on the left which appears  ectatic. IMPRESSION: No acute finding. Atrophy and chronic small vessel disease. No evidence of metastatic disease. Atherosclerosis. Electronically Signed   By: MNelson ChimesM.D.   On: 09/18/2015 10:41   Ct Chest Wo Contrast  08/15/2015  ADDENDUM REPORT: 08/15/2015 12:07 IMPRESSION: 1. Within the superior segment of the right lower lobe there is an enlarging sub solid nodule. This has an equivalent diameter of 1.8 cm. Previously 1.5 cm. This is suspicious for a low-grade indolent neoplasm such as pulmonary adenocarcinoma. 2. Interval development of multiple thin walled cavitary nodules in both lungs. Although nonspecific the appearance is suggestive of pulmonary Langerhans Cell Histiocytosis. Less favored diagnostic considerations include (but are not limited to) multiple septic emboli or of metastatic squamous cell carcinoma. 3. Emphysema 4. Aortic atherosclerosis and LAD coronary artery calcification. Electronically Signed   By: TKerby MoorsM.D.   On: 08/15/2015 12:07  08/15/2015  CLINICAL DATA:  History of left lung cancer in 2013.  Former smoker. EXAM: CT CHEST WITHOUT CONTRAST TECHNIQUE: Multidetector CT imaging of the chest was performed following the standard protocol without IV contrast. COMPARISON:  02/07/2015 FINDINGS: Mediastinum: The heart size appears normal. Aortic atherosclerosis noted. Calcification involving the LAD coronary artery noted. There is no mediastinal or hilar adenopathy. There is no axillary or supraclavicular adenopathy. Lungs/Pleura: No pleural fluid identified. Moderate to advanced changes of centrilobular and paraseptal emphysema. The left upper lobe pulmonary nodule is unchanged measuring 4 mm, image 43 of series 4. Sub solid nodule within the posterior right upper lobe measures 1.7 x 1.9 cm and has an equivalent diameter of 1.8 cm, image number 53 of series 4. On the previous exam this had an equivalent diameter of 1.5 cm. In the left lower lobe there is a 8 x 7 mm nodule (8  mm mean diameter) on image 63 of series 4. Previously this had an equivalent diameter of 7 mm. In the right lower low there is a 9 x 7 mm nodule (8 mm mean diameter) on image 85 of series 4. This is unchanged from previous exam. Multiple new cavitary nodules are scattered throughout both lungs. For example, in the right lower lobe new cavitary nodule measures 7 by 6 mm, mean diameter 7 mm, image 66 of series 4. In the left lower lobe posteriorly there is a new cavitary 6 x 6 mm nodule (6 mm mean diameter) on image 78 of series 4. In the right lower lobe there is a new cavitary nodule measuring 4 x 5 mm nodule (5 mm mean diameter) on image 87 of series 4. Upper Abdomen: There is no acute findings identified within the upper abdomen. The adrenal glands are normal. There are bilateral renal cysts of varying density. These are incompletely characterized without IV contrast. Small nonobstructing calculi are noted within the upper pole of the left kidney. Musculoskeletal: No aggressive lytic or sclerotic bone lesions identified. Degenerative disc disease noted within the thoracic spine. Electronically Signed: By: Taylor  Stroud M.D. On: 08/15/2015 11:04   Nm Pet Image Restag (ps) Skull Base To Thigh  08/21/2015  CLINICAL DATA:  Subsequent Treatment strategy for history lung cancer. Right lower lobe sub solid pulmonary nodule. Multiple thin-walled cavitary nodules. EXAM: NUCLEAR MEDICINE PET SKULL BASE TO THIGH TECHNIQUE: 6.5 mCi F-18 FDG was injected intravenously. Full-ring PET imaging was performed from the skull base to thigh after the radiotracer. CT data was obtained and used for attenuation correction and anatomic localization. FASTING BLOOD GLUCOSE:  Value: 103 mg/dl COMPARISON:  Chest CTs including 08/15/2015.  PET of 04/02/2014 FINDINGS: NECK Left-sided parotid hypermetabolic nodule again identified. This measures 6 mm and a S.U.V. max of 4.2 today. Compare 6 mm and a S.U.V. max of 5.9 on the prior. No  cervical nodal hypermetabolism CHEST Low right paratracheal node which measures 9 mm and a S.U.V. max of 4.8 today on image 72/series 4. Compare 9 mm and a S.U.V. max of 3.4 previously. More cephalad right paratracheal node measures 8 mm and a S.U.V. max of 3.3 on image 62/series 4. This node measured similar in size and was not hypermetabolic on the prior exam. Right hilar hypermetabolism is new, including at a S.U.V. max of 4.7. No well-defined adenopathy in this area. The sub solid superior segment right lower lobe pulmonary nodule described on the prior exam measures on the order of 1.6 cm and a S.U.V. max of 1.1 on image 32/series 8. A focus of   hypermetabolism about the anterior left pleural space is without well-defined pleural mass. This measures a S.U.V. max of 2.4 on image 86/series 4 and is new. Pleural-parenchymal hypermetabolism at the left apex is favored to be due to scarring, relatively mild. 9 mm left lower lobe pulmonary nodule measures a S.U.V. max of 2.4 on image 38/series 8. This nodule measured 6 mm and was not significantly hypermetabolic on the prior PET (below PET resolution). ABDOMEN/PELVIS No areas of abnormal hypermetabolism. SKELETON No abnormal marrow activity. CT IMAGES PERFORMED FOR ATTENUATION CORRECTION Left carotid atherosclerosis. No cervical adenopathy. Chest findings deferred to recent diagnostic CT. Ascending aortic dilatation again identified including at 4.1 cm. Advanced centrilobular emphysema. Other scattered pulmonary lesions, including cavitary nodules, are below PET resolution. Normal adrenal glands. Bilateral fluid density renal lesions are likely cysts. There is minimal complexity associated with a partially calcified left renal lesion, similar. Cholecystectomy. Mild prostatomegaly. Bladder wall thickening. Although there is no significant hydronephrosis, a stone is identified at the proximal left ureter measuring 8 mm on image 135/series 4. Smaller stones are seen in  the left renal collecting system. Subtle hyper attenuation is identified at the left renal pelvis on image 130/series 4. remote left rib trauma or surgery. IMPRESSION: 1. The superior segment right lower lobe sub solid pulmonary nodule is not significantly hypermetabolic. As low-grade adenocarcinoma is typically not hypermetabolic, this remains a concern. 2. Increase in low-level hypermetabolism within non pathologically enlarged thoracic nodes. These could be reactive or related to metastatic disease. This could be re-evaluated at followup or 1 or more these nodes could be sampled. 3. A left lower lobe pulmonary nodule has enlarged since the prior PET and demonstrates hypermetabolism. Suspicious for metastatic or metachronous carcinoma. 4. No evidence of extrathoracic metastatic disease. 5. Left-sided stone currently in the proximal left ureter without significant proximal obstruction. Suspect hemorrhage in the left renal pelvis, incompletely evaluated. Depending on clinical concern, dedicated hematuria protocol CT could be performed for further delineation. 6. Prostatomegaly with suspicion of secondary bladder wall thickening/out obstruction. 7. Left parotid nodule is unchanged and likely an indolent primary neoplasm. Electronically Signed   By: Kyle  Talbot M.D.   On: 08/21/2015 16:51      Ct Chest Wo Contrast  02/07/2015  CLINICAL DATA:  Left lung cancer 2013, status post surgery and radiation. Prostate cancer 2008. EXAM: CT CHEST WITHOUT CONTRAST TECHNIQUE: Multidetector CT imaging of the chest was performed following the standard protocol without IV contrast. COMPARISON:  08/16/2014 FINDINGS: Mediastinum/Nodes: Upper right paratracheal node 8 mm in short axis on image 20 series 3, stable by my measurement. No pathologic adenopathy is observed. Atherosclerotic aortic arch. Ascending thoracic aorta 4.3 cm diameter. The dual lead pacer noted. Lungs/Pleura: Severe emphysema. Architectural distortion and  density at the left lung apex is increased from the prior exam, image 14 series 4 compared image 15 of series 4 of the prior exam. However, the increased density is disorganized, irregular, surrounds varies bulla, and accordingly is difficult to measure. A lot of this increase in density could be inflammatory. Right lower lobe 0.9 by 0.7 cm ground-glass opacity nodule, stable by my measurements from 08/16/2014, but slowly increasing from 08/18/2011. Superior segment right lower lobe sub solid nodule 1.3 by 1.7 cm on image 27 series 4, essentially stable from 08/16/2014 but increased from 08/18/2011. 0.6 by 0.4 cm super segment left lower lobe nodule on image 24 series 4, previously 0.4 by 0.4 cm on 08/16/2014 and perhaps 2 mm in diameter on 08/18/2011. Left   lower lobe solid nodule 0.7 by 0.5 cm on image 35 series 4, stable by my measurements on 08/16/2014 and previously measuring 0.5 by 0.4 cm on 08/18/2011. Upper abdomen: Cholecystectomy. Hypodense lesions of both renal upper poles favoring cysts given the photopenia on PET-CT. Musculoskeletal: Thoracic spondylosis. IMPRESSION: 1. Increase in architectural distortion and primarily interstitial density at the left lung apex, possibly from radiation therapy or inflammatory process, less likely to be locally recurrent malignancy. Merits observation. 2. Several ground-glass nodules and several small left-sided solid nodules are present in the lungs. These are stable from 08/16/2014 but for the most part have enlarged compared to 08/18/11. Multi focal low grade adenocarcinoma is certainly not excluded based on these observations. These have not been appreciably hypermetabolic on PET-CT. 3. Aneurysmal ascending aorta. Recommend annual imaging followup by CTA or MRA. This recommendation follows 2010 ACCF/AHA/AATS/ACR/ASA/SCA/SCAI/SIR/STS/SVM Guidelines for the Diagnosis and Management of Patients with Thoracic Aortic Disease. Circulation. 2010; 121: [id number] 4. Severe  emphysema. Electronically Signed   By: Walter  Liebkemann M.D.   On: 02/07/2015 10:41    Ct Chest Wo Contrast  08/16/2014   CLINICAL DATA:  Left lower lobe lung cancer restaging  EXAM: CT CHEST WITHOUT CONTRAST  TECHNIQUE: Multidetector CT imaging of the chest was performed following the standard protocol without IV contrast.  COMPARISON:  05/18/2014 CT abdomen/ pelvis, most recent chest CT 12/28/2013  FINDINGS: Mediastinum/Nodes: Right-sided pacer in place. Ascending aortic ectasia measuring 4.2 cm image 33 again noted.  Heart size is normal. High pretracheal lymph node measuring 0.7 cm stable. No new hilar or mediastinal lymphadenopathy. No pericardial effusion.  Lungs/Pleura: Emphysematous changes are noted. Nodular ground-glass opacity right lower lobe measuring 0.8 cm image 47 is stable. 3 mm left upper lobe pulmonary nodule image 26 is stable. Subpleural nodularity predominantly in the left upper and lower lobes is subjectively stable. Stable left lower lobe 0.5 cm nodule image 38. Left upper lobe/perihilar linear presumed radiation fibrosis/scarring is stable. No pleural effusion.  Upper abdomen: Bilateral renal cortical cysts are again noted, partly visualized.  Musculoskeletal: No lytic or sclerotic osseous lesion. No acute osseous abnormality. No compression deformity.  IMPRESSION: No significant change in bilateral pulmonary nodules or presumed post treatment change without acute abnormality or evidence for intrathoracic recurrence or metastasis.   Electronically Signed   By: Gretchen  Green M.D.   On: 08/16/2014 09:48   I have independently reviewed the above radiology studies  and reviewed the findings with the patient.     PET:04/02/2014 CLINICAL DATA: Subsequent treatment strategy for restaging of lung cancer.  EXAM: NUCLEAR MEDICINE PET SKULL BASE TO THIGH  TECHNIQUE: 7.8 mCi F-18 FDG was injected intravenously. Full-ring PET imaging was performed from the skull base to thigh  after the radiotracer. CT data was obtained and used for attenuation correction and anatomic localization.  FASTING BLOOD GLUCOSE: Value: 85 mg/dl  COMPARISON: PET of 12/01/2012. Most recent chest CT 12/28/2013.  FINDINGS: NECK  Hypermetabolism which corresponds to a posterior left parotid nodule. This measures 6 mm in a S.U.V. max of 5.9 on image 30. On the prior PET of 12/01/2012, this measured similar in size and was hypermetabolic.  No hypermetabolic cervical nodes.  CHEST  Mild hypermetabolism corresponding to left apical pleural parenchymal thickening. A left lower lobe vague 6 mm nodule corresponds to low level, non malignant range hypermetabolism. This measures a S.U.V. max of 0.8 on image 41. This nodule was similar in size on the prior CT and has been present back to   12/01/2012.  Low right paratracheal node measures 9 mm and a S.U.V. max of 3.4 on image 76. This node measured similar in size back to 12/01/2012.  ABDOMEN/PELVIS  No areas of abnormal hypermetabolism.  SKELETON  Facet arthropathy involving the left upper cervical spine. Concurrent hypermetabolism. No suspicious osseous abnormalities.  CT IMAGES PERFORMED FOR ATTENUATION CORRECTION  Cerebral atrophy. No cervical adenopathy. Presumed sebaceous cyst about the posterior right neck at 5.6 cm.  Mild cardiomegaly. Ascending aorta upper normal in size, 3.8 cm. Similar. Aortic and branch vessel atherosclerosis. Pacer. Centrilobular emphysema. Right lower lobe 7 mm ground-glass nodule on image 48, similar. 4 mm left lower lobe nodule on image 27, similar.  Normal adrenal glands. Bilateral renal cysts. Bilateral nephrolithiasis. 11 mm hyper attenuating left renal lesion on image 135 is unchanged back to 12/01/2012. Cholecystectomy. Right common iliac artery dilatation is similar at 1.6 cm. Mild prostatomegaly. Right hydrocele. Left thoracotomy changes.  IMPRESSION: 1. A right  paratracheal node is mildly hypermetabolic, but similar in size back to 2014. Favored to be reactive. 2. Otherwise, no evidence of hypermetabolic recurrent or metastatic disease. 3. Left lower lobe nodule which demonstrates low level, non malignant range hypermetabolism. Given stability back to 2014, likely benign. 4. Incidental findings, including borderline ascending aortic dilatation, probable left renal complex cyst, and right common iliac artery dilatation. 5. Subtle hypermetabolic nodule in the left parotid gland has been present back to 2014. Although this could represent a primary parotid neoplasm, stability suggests relative indolent behavior. This could be re-evaluated on follow-up or further characterized with contrast and neck CT.   Electronically Signed  By: Kyle Talbot M.D.  On: 04/02/2014 12:58 Ct Chest Wo Contrast  12/28/2013   CLINICAL DATA:  Patient with history of lung cancer in 2013. Recurrence in 2014. Cough, mild shortness of breath.  EXAM: CT CHEST WITHOUT CONTRAST  TECHNIQUE: Multidetector CT imaging of the chest was performed following the standard protocol without IV contrast.  COMPARISON:  Chest CT 06/16/2013  FINDINGS: Pacer apparatus within the right anterior hemi thorax. Normal heart size. No pericardial effusion. No mediastinal or hilar adenopathy. Stable prominent sub cm mediastinal lymph nodes. Calcified atherosclerotic plaque involving the thoracic aorta.  Central airways are patent. Extensive diffuse centrilobular emphysema. Stable 8 mm right lower lobe pulmonary nodule. Stable 9 mm right upper lobe ground-glass nodule (image 23; series 4). Stable 5 mm left lower lobe pulmonary nodule (image 31; series 4). Stable left upper lobe suture line.  Incidental imaging of the upper abdomen demonstrate postcholecystectomy changes. Bilateral renal cysts. Normal adrenal glands. No aggressive or acute appearing osseous lesions.  IMPRESSION: Stable CT of the chest.  No  change in pulmonary nodules.  Emphysema   Electronically Signed   By: Drew  Davis M.D.   On: 12/28/2013 13:18  Ct Chest Wo Contrast  08/28/2013   CLINICAL DATA:  Followup lung cancer  EXAM: CT CHEST WITHOUT CONTRAST  TECHNIQUE: Multidetector CT imaging of the chest was performed following the standard protocol without IV contrast.  COMPARISON:  06/16/2013  FINDINGS: No pleural effusion identified. Moderate to advanced changes of centrilobular emphysema identified. Pulmonary nodule within the right lower lobe is stable measuring 8 mm, image 44/series 5. Pulmonary nodule within the left lower lobe is stable measuring 5 mm, image 36/series 5. Peripheral scar like densities within the left lower lobe appear stable, image 31/series 5. Stable appearance of left upper lobe posterior suture line.  The heart size appears normal. There is no pericardial effusion. No mediastinal   or hilar adenopathy. Calcified atherosclerotic disease involves the thoracic aorta.  Incidental imaging through the upper abdomen is significant for bilateral renal cysts. The adrenal glands appear normal. Patient is status post cholecystectomy.  Review of the visualized bony structures is negative for aggressive lytic or sclerotic bone lesion.  IMPRESSION: 1. Stable CT of the chest. 2. No change in bilateral pulmonary nodules. 3. Atherosclerotic disease. 4. Emphysema.   Electronically Signed   By: Taylor  Stroud M.D.   On: 08/28/2013 14:27   Ct Chest Wo Contrast  06/16/2013   CLINICAL DATA:  Lung cancer. Prior left upper lobe resection. Right lung cancer with radiation. History of prostate cancer.  EXAM: CT CHEST WITHOUT CONTRAST  TECHNIQUE: Multidetector CT imaging of the chest was performed following the standard protocol without IV contrast.  COMPARISON:  02/27/2013  FINDINGS: Severe emphysematous changes in the lungs. Postsurgical changes in the posterior superior left upper lobe. 12 mm nodular area in the left apex, stable. Biapical scarring.  Right lower lobe nodule on image 42 measures 8 mm, stable. Subpleural densities noted in the left lateral hemithorax are unchanged. No pleural effusions. No new or enlarging pulmonary nodules. Small nodule in the left lower lobe on image 36 measures 5 mm, stable.  Pacer is noted in place with leads in the right atrium and right ventricle, stable. Heart is normal size. Ascending aortic borderline in diameter at 3.9 cm. Small scattered mediastinal lymph nodes, none pathologically enlarged or changed since prior study. Chest wall soft tissues are unremarkable.  Large subcutaneous low-density lesion is noted posteriorly in the right posterior shoulder measuring approximately 6.2 cm, stable since PET CT. This presumably represents a large sebaceous cyst.  IMPRESSION: Stable postoperative changes in the posterior superior left upper lobe. Stable bilateral pulmonary nodules. No new or enlarging pulmonary nodules.  Mild slight dilatation of the ascending thoracic aorta measuring 4.0 cm.   Electronically Signed   By: Kevin  Dover M.D.   On: 06/16/2013 13:11  Ct Angio Head W/cm &/or Wo Cm  11/13/2012   *RADIOLOGY REPORT*  Clinical Data:  Numbness all over.  History of bilateral ICA pseudoaneurysms.  Headache.  THE PATIENT WAS PREMEDICATED WITH IV STEROIDS BECAUSE OF THE HISTORY OF CONTRAST ALLERGY.  THERE WERE NO ADVERSE EVENTS.  CT ANGIOGRAPHY HEAD AND NECK  Technique:  Multidetector CT imaging of the head and neck was performed using the standard protocol during bolus administration of intravenous contrast.  Multiplanar CT image reconstructions including MIPs were obtained to evaluate the vascular anatomy. Carotid stenosis measurements (when applicable) are obtained utilizing NASCET criteria, using the distal internal carotid diameter as the denominator.  Contrast: 50mL OMNIPAQUE IOHEXOL 350 MG/ML SOLN  Comparison:  08/25/2011 CT angio neck.  10/03/2008 CT angio head.  CTA NECK  Findings:  Right chest cardiac pacemaker.   COPD.  Spiculated left upper lobe lesion is new from priors measuring 11 x 15 mm.  In the setting of chronic of tobacco abuse, concern raised for pulmonary neoplasm.  Previously identified and biopsied, spiculated lesion superior segment left lower lobe lesion, not well seen on today's examination.  Stable subcentimeter thyroid nodules.  Negative larynx.  Advanced spondylosis.  Three-vessel arch configuration without proximal stenosis.  Unremarkable right common carotid artery.  Mild nonstenotic posterior wall plaque.  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  No stenosis.  No significant left or right   vertebral artery pathology.  Left vertebral dominant.  Unremarkable left common carotid artery.  Posterior wall plaque is non stenotic in the proximal left internal carotid artery. Rim calcified predominately thrombosed 7 x 7 x 8 mm more inferior cervical ICA aneurysm projecting medially is unchanged.  More superiorly, a  partially thrombosed and calcified skull base cervical ICA aneurysm measuring 8 x 10 x 11 mm is also stable. Lateral thrombus extends cephalad to the level of the horizontal petrous/foramen lacerum level.  No ICA dissection.   Review of the MIP images confirms the above findings.  IMPRESSION:  Stable extracranial atherosclerotic change and stable bilateral cervical ICA pseudoaneurysms.  No evidence for enlargement, the dissection, or neck hemorrhage.  New spiculated left upper lobe 11 x 15 mm lesion; in the setting of COPD cannot exclude squamous cell carcinoma. As the patient has been premedicated, CT chest will be performed shortly with an additional 50 ml of contrast for expeditious  evaluation of the chest and mediastinum given his contrast allergy. Findings discussed with EDP.  CTA HEAD  Findings:  There is no evidence for acute infarction, intracranial hemorrhage, mass  lesion, hydrocephalus, or extra-axial fluid.  Mild atrophy.  Mild white matter disease.  Post infusion, no abnormal enhancement.  Intact calvarium.  The  intracranial vasculature is dolichoectatic but patent.  No intracranial dissection or berry aneurysm is present.  The left vertebral is dominant.  There is no proximal vascular occlusion or significant flow reducing lesion. Similar appearance to priors.   Review of the MIP images confirms the above findings.  IMPRESSION: Unremarkable CTA head.   Original Report Authenticated By: John Curnes, M.D.   Ct Angio Neck W/cm &/or Wo/cm  11/13/2012   *RADIOLOGY REPORT*  Clinical Data:  Numbness all over.  History of bilateral ICA pseudoaneurysms.  Headache.  THE PATIENT WAS PREMEDICATED WITH IV STEROIDS BECAUSE OF THE HISTORY OF CONTRAST ALLERGY.  THERE WERE NO ADVERSE EVENTS.  CT ANGIOGRAPHY HEAD AND NECK  Technique:  Multidetector CT imaging of the head and neck was performed using the standard protocol during bolus administration of intravenous contrast.  Multiplanar CT image reconstructions including MIPs were obtained to evaluate the vascular anatomy. Carotid stenosis measurements (when applicable) are obtained utilizing NASCET criteria, using the distal internal carotid diameter as the denominator.  Contrast: 50mL OMNIPAQUE IOHEXOL 350 MG/ML SOLN  Comparison:  08/25/2011 CT angio neck.  10/03/2008 CT angio head.  CTA NECK  Findings:  Right chest cardiac pacemaker.  COPD.  Spiculated left upper lobe lesion is new from priors measuring 11 x 15 mm.  In the setting of chronic of tobacco abuse, concern raised for pulmonary neoplasm.  Previously identified and biopsied, spiculated lesion superior segment left lower lobe lesion, not well seen on today's examination.  Stable subcentimeter thyroid nodules.  Negative larynx.  Advanced spondylosis.  Three-vessel arch configuration without proximal stenosis.  Unremarkable right common carotid artery.  Mild nonstenotic  posterior wall plaque.  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  No stenosis.  No significant left or right vertebral artery pathology.  Left vertebral dominant.  Unremarkable left common carotid artery.  Posterior wall plaque is non stenotic in the proximal left internal carotid artery. Rim calcified predominately thrombosed 7 x 7 x 8 mm more inferior cervical ICA aneurysm projecting medially is unchanged.  More superiorly, a  partially thrombosed and calcified skull base   cervical ICA aneurysm measuring 8 x 10 x 11 mm is also stable. Lateral thrombus extends cephalad to the level of the horizontal petrous/foramen lacerum level.  No ICA dissection.   Review of the MIP images confirms the above findings.  IMPRESSION:  Stable extracranial atherosclerotic change and stable bilateral cervical ICA pseudoaneurysms.  No evidence for enlargement, the dissection, or neck hemorrhage.  New spiculated left upper lobe 11 x 15 mm lesion; in the setting of COPD cannot exclude squamous cell carcinoma. As the patient has been premedicated, CT chest will be performed shortly with an additional 50 ml of contrast for expeditious  evaluation of the chest and mediastinum given his contrast allergy. Findings discussed with EDP.  CTA HEAD  Findings:  There is no evidence for acute infarction, intracranial hemorrhage, mass lesion, hydrocephalus, or extra-axial fluid.  Mild atrophy.  Mild white matter disease.  Post infusion, no abnormal enhancement.  Intact calvarium.  The  intracranial vasculature is dolichoectatic but patent.  No intracranial dissection or berry aneurysm is present.  The left vertebral is dominant.  There is no proximal vascular occlusion or significant flow reducing lesion. Similar appearance to priors.   Review of the MIP images confirms the above findings.  IMPRESSION: Unremarkable CTA head.    Original Report Authenticated By: John Curnes, M.D.   Ct Chest W Contrast  11/22/2012   *RADIOLOGY REPORT*  Clinical Data: Shortness of breath, weakness, abnormality on recently performed neck CTA.  CT CHEST WITH CONTRAST  Technique:  Multidetector CT imaging of the chest was performed following the standard protocol during bolus administration of intravenous contrast.  Contrast: 50mL OMNIPAQUE IOHEXOL 300 MG/ML  SOLN  Comparison: Neck CT head - earlier same day; chest CT - 07/14/2012; 09/03/2011  Findings:  Stable postsurgical change of the superior segment of the left lower lobe.  While the external diameter of the previous identified approximately 1.3 x 1.0 cm nodule within the left lung apex (image 13, series 3) is grossly unchanged, the nodule appears more consolidated on the present examination, with slight differences possibly attributable to slice selection.  Additional approximately 8 mm ground-glass nodule within the right lower lobe (image 43, series 3) appears grossly unchanged, as does an approximately 5 mm ground-glass nodule within the superior segment of the lingula (image 34, series 3).  No new discrete pulmonary nodules.  Scattered shoddy mediastinal lymph nodes appear grossly unchanged and are individually not enlarged by size criteria with index pretracheal node measuring 9 mm in short axis diameter (image 23, series 2). No definite hilar or axillary lymphadenopathy.  Advanced centrilobular emphysematous change.  Minimal dependent subsegmental atelectasis.  No focal airspace opacity.  No pleural effusion or pneumothorax.  The central pulmonary airways are patent.  Normal heart size.  Right anterior chest wall dual lead pacemaker. No pericardial effusion.  Normal caliber of the main pulmonary artery. Grossly unchanged mild ectasia of the ascending thoracic aorta measuring approximately 4.1 cm in greatest oblique short axis diameter (image 30, series 2).  Conventional configuration of the aortic  arch.  Scattered minimal atherosclerotic plaque within the thoracic aorta.  No definite thoracic aortic dissection or periaortic stranding.  Limited evaluation of the upper abdomen demonstrates a partially exophytic approximately 3.0 cm cyst arising from the posterior superior aspect of the right kidney.  Several additional smaller renal cysts are noted bilaterally.  Post cholecystectomy.  No acute or aggressive osseous abnormalities.  IMPRESSION:  1.  No change to minimal increase in the indeterminate approximately 1.3 cm   nodule within the left lung apex, with slight differences in possibly attributable to slice selection.  While possibly representing of scar, given advanced emphysematous change, a slowly growing lung cancer is not excluded.  As such, further evaluation with a follow-up chest CT in 3 months is recommended.  2.  Grossly unchanged bilateral sub centimeter ground-glass nodules, likely too small to accurately characterize.  Continued attention on follow-up is recommended.  3.  Stable postsurgical change of the superior segment of the left lower lobe.  4.  Grossly unchanged mild fusiform ectasia of the descending thoracic aorta measuring approximately 41 mm in diameter.  Above findings discussed with Dr. Allen at the time of examination completion.   Original Report Authenticated By: John Watts V, MD   Ct Chest Wo Contrast  07/14/2012  *RADIOLOGY REPORT*  Clinical Data: Follow-up lung cancer.  CT CHEST WITHOUT CONTRAST  Technique:  Multidetector CT imaging of the chest was performed following the standard protocol without IV contrast.  Comparison: CT scan 08/18/2011.  Findings: The chest wall is stable.  A permanent right-sided pacemaker is noted.  No supraclavicular or axillary lymphadenopathy.  The bony thorax is intact.  No destructive bone lesions or spinal canal compromise.  Moderate degenerative changes involving the spine.  The heart is normal in size.  No pericardial effusion.  No mediastinal  or hilar lymphadenopathy.  Stable tortuosity and ectasia of the thoracic aorta.  The esophagus is grossly normal.  Examination of the lung parenchyma demonstrates stable advanced emphysematous changes and pulmonary interstitial scarring.  The left upper lobe pulmonary nodule has been excised.  No findings for recurrent or residual tumor.  There is a new left apical lung density.  This could reflect progressive scarring change but does need observation.  There is a 7 mm nodule at the right lung base.  This is difficult to see on the prior CT but was a on the prior PET CT and needs continued observation.  There is an ill-defined nodular density measuring approximate 5 mm in the superior segment of the left lower lobe on image number 31. I believe this was present on the prior CT scan although it is more obvious now.  A 4 mm nodule is noted on image number 22 in the left upper lobe. This was present on the prior study and has not significantly changed.  No acute pulmonary findings or pleural effusion.  The upper abdomen is unremarkable.  Stable right renal cyst.  IMPRESSION:  1.  Surgical changes from prior excision of a left apical lung neoplasm. 2.  Advanced emphysematous changes and pulmonary scarring. 3.  New 13 x 8 mm left apical lung density.  This could reflect scarring change but needs close observation. 4.  Other small pulmonary nodules as discussed above.  Recommend continued observation. 5.  No acute pulmonary findings and no mediastinal or hilar lymphadenopathy. 6.  Stable tortuosity and ectasia of the thoracic aorta.   Original Report Authenticated By: P. Gallerani, M.D.       Recent Labs: Lab Results  Component Value Date   WBC 5.6 09/05/2015   HGB 13.4 09/05/2015   HCT 39.9 09/05/2015   PLT 205 09/05/2015   GLUCOSE 80 09/05/2015   CHOL 192 07/22/2011   TRIG 76 07/22/2011   HDL 57 07/22/2011   LDLCALC 120* 07/22/2011   ALT 11 09/05/2015   AST 15 09/05/2015   NA 139 09/05/2015   K 3.8  09/05/2015   CL 102 09/02/2015   CREATININE 1.0   09/05/2015   BUN 7.1 09/05/2015   CO2 28 09/05/2015   TSH 2.26 05/28/2014   INR 1.02 09/02/2015   HGBA1C 5.0 06/13/2011      Assessment / Plan:     Wedge resection of Stage I INVASIVE MODERATELY DIFFERENTIATED ADENOCARCINOMA, SPANNING 2.2 CM IN GREATEST DIMENSION superior segment left lower lobe 2013 Ascending thoracic aorta 4.3 cm diameter The patient underwent stereotactic radiotherapy to the left upper lobe lesion. Completed 01/03/13  EGFR positive Several ground-glass nodules and several small left-sided solid nodules are present in the lungs. These are stable from 08/16/2014 but for the most part have enlarged compared to 08/18/11.  The patient continues to have multiple small bilateral nodules some which have changed slightly and others that are unchanged. With his previous history of 2 separate lung malignancies in in the left lung In the suspicious nature of the current lesions  PET scan done, the 7 mm left lower lobe lung nodule was hypermetabolic, a right lower lobe groundglass opacity is not. Recent ebus and navigation bronchoscopy both to the left and right lower lobe lesions revealed no definitive diagnosis of recurrent malignancy however the patient's progression on CT scan and PET scan is very worrisome for multicentric adenocarcinoma. The patient's xrays  and path were presented in the multidisciplinary thoracic oncology conference last week , he was seen by  medical oncology to consider treatment considering the previous biopsy that was EGRF positive. The films were reviewed by interventional radiology ,  Although the left lung lesion is a small lesion,needle biopsy of the left lower lobe lesion could be attempted, the patient's had 2 previous thoracotomies on the left  so the risk of left pneumothorax is decreased some.   Recent CT of the brain  Shows no evidence of metastatic disease,   the patient has some dizziness she  attributes to his Lopressor. , this was stopped and his dizziness has mark ly improved   In discussion with the patient's daughter and the patient he is decided to move to [place] over the next several weeks so his daughter can assist in his care. He has an appointment to see Dr. Jennifer Garst concerning his oncology issues. Through the multidisciplinary [facility] onc[facility] he was seen by medical oncology who deferred any definite decisions about rebiopsy or treatment until after he has smoothed and established with Dr. Garst. We will make arrangements for the patient to have copies of the most recent PET and CT scan of the chest to take with him.  I'll plan to see him back at his request as needed.    Edward B Gerhardt 09/19/2015 12:06 PM          

## 2015-09-30 ENCOUNTER — Other Ambulatory Visit: Payer: Self-pay | Admitting: *Deleted

## 2015-09-30 DIAGNOSIS — R911 Solitary pulmonary nodule: Secondary | ICD-10-CM

## 2015-10-09 ENCOUNTER — Other Ambulatory Visit: Payer: Self-pay | Admitting: Radiology

## 2015-10-10 ENCOUNTER — Ambulatory Visit (HOSPITAL_COMMUNITY)
Admission: RE | Admit: 2015-10-10 | Discharge: 2015-10-10 | Disposition: A | Discharge status: Home / Self Care | Payer: Medicare Other | Source: Ambulatory Visit | Attending: Cardiothoracic Surgery | Admitting: Cardiothoracic Surgery

## 2015-10-10 ENCOUNTER — Encounter (HOSPITAL_COMMUNITY): Payer: Self-pay

## 2015-10-10 ENCOUNTER — Ambulatory Visit (HOSPITAL_COMMUNITY)
Admission: RE | Admit: 2015-10-10 | Discharge: 2015-10-10 | Disposition: A | Discharge status: Home / Self Care | Payer: Medicare Other | Source: Ambulatory Visit | Attending: Interventional Radiology | Admitting: Interventional Radiology

## 2015-10-10 DIAGNOSIS — R918 Other nonspecific abnormal finding of lung field: Secondary | ICD-10-CM | POA: Insufficient documentation

## 2015-10-10 DIAGNOSIS — Z79899 Other long term (current) drug therapy: Secondary | ICD-10-CM | POA: Diagnosis not present

## 2015-10-10 DIAGNOSIS — E785 Hyperlipidemia, unspecified: Secondary | ICD-10-CM | POA: Diagnosis not present

## 2015-10-10 DIAGNOSIS — I2510 Atherosclerotic heart disease of native coronary artery without angina pectoris: Secondary | ICD-10-CM | POA: Insufficient documentation

## 2015-10-10 DIAGNOSIS — M797 Fibromyalgia: Secondary | ICD-10-CM | POA: Diagnosis not present

## 2015-10-10 DIAGNOSIS — R911 Solitary pulmonary nodule: Secondary | ICD-10-CM | POA: Diagnosis present

## 2015-10-10 DIAGNOSIS — R042 Hemoptysis: Secondary | ICD-10-CM | POA: Diagnosis not present

## 2015-10-10 DIAGNOSIS — Z8546 Personal history of malignant neoplasm of prostate: Secondary | ICD-10-CM | POA: Insufficient documentation

## 2015-10-10 DIAGNOSIS — J449 Chronic obstructive pulmonary disease, unspecified: Secondary | ICD-10-CM | POA: Insufficient documentation

## 2015-10-10 DIAGNOSIS — Z880 Allergy status to penicillin: Secondary | ICD-10-CM | POA: Insufficient documentation

## 2015-10-10 DIAGNOSIS — G4700 Insomnia, unspecified: Secondary | ICD-10-CM | POA: Insufficient documentation

## 2015-10-10 DIAGNOSIS — Z9889 Other specified postprocedural states: Secondary | ICD-10-CM | POA: Diagnosis not present

## 2015-10-10 DIAGNOSIS — I10 Essential (primary) hypertension: Secondary | ICD-10-CM | POA: Diagnosis not present

## 2015-10-10 DIAGNOSIS — Z7901 Long term (current) use of anticoagulants: Secondary | ICD-10-CM | POA: Insufficient documentation

## 2015-10-10 DIAGNOSIS — K219 Gastro-esophageal reflux disease without esophagitis: Secondary | ICD-10-CM | POA: Insufficient documentation

## 2015-10-10 DIAGNOSIS — D381 Neoplasm of uncertain behavior of trachea, bronchus and lung: Secondary | ICD-10-CM | POA: Insufficient documentation

## 2015-10-10 DIAGNOSIS — F17210 Nicotine dependence, cigarettes, uncomplicated: Secondary | ICD-10-CM | POA: Insufficient documentation

## 2015-10-10 LAB — CBC
HEMATOCRIT: 41.4 % (ref 39.0–52.0)
HEMOGLOBIN: 14.1 g/dL (ref 13.0–17.0)
MCH: 31.5 pg (ref 26.0–34.0)
MCHC: 34.1 g/dL (ref 30.0–36.0)
MCV: 92.4 fL (ref 78.0–100.0)
Platelets: 234 10*3/uL (ref 150–400)
RBC: 4.48 MIL/uL (ref 4.22–5.81)
RDW: 13.1 % (ref 11.5–15.5)
WBC: 6.6 10*3/uL (ref 4.0–10.5)

## 2015-10-10 LAB — APTT: APTT: 33 s (ref 24–37)

## 2015-10-10 LAB — PROTIME-INR
INR: 0.98 (ref 0.00–1.49)
Prothrombin Time: 13.2 seconds (ref 11.6–15.2)

## 2015-10-10 MED ORDER — FENTANYL CITRATE (PF) 100 MCG/2ML IJ SOLN
INTRAMUSCULAR | Status: AC | PRN
  Administered 2015-10-10: 25 ug via INTRAVENOUS

## 2015-10-10 MED ORDER — SODIUM CHLORIDE 0.9 % IV SOLN
Freq: Once | INTRAVENOUS | Status: AC
  Administered 2015-10-10: 10:00:00 via INTRAVENOUS

## 2015-10-10 MED ORDER — MIDAZOLAM HCL 2 MG/2ML IJ SOLN
INTRAMUSCULAR | Status: AC
  Filled 2015-10-10: qty 4

## 2015-10-10 MED ORDER — FENTANYL CITRATE (PF) 100 MCG/2ML IJ SOLN
INTRAMUSCULAR | Status: AC
  Filled 2015-10-10: qty 2

## 2015-10-10 MED ORDER — LIDOCAINE-EPINEPHRINE 1 %-1:100000 IJ SOLN
INTRAMUSCULAR | Status: AC
  Filled 2015-10-10: qty 1

## 2015-10-10 MED ORDER — MIDAZOLAM HCL 2 MG/2ML IJ SOLN
INTRAMUSCULAR | Status: AC | PRN
  Administered 2015-10-10 (×2): 0.5 mg via INTRAVENOUS

## 2015-10-10 NOTE — H&P (Signed)
Chief Complaint: Patient was seen in consultation today for left lung mass biopsy at the request of Gerhardt,Edward B  Referring Physician(s): Grace Isaac  Supervising Physician: Sandi Mariscal  Patient Status: Outpatient  History of Present Illness: [name] is a 82 y.o. male   Per Dr Servando Snare note:  Wedge resection of Stage I INVASIVE MODERATELY DIFFERENTIATED ADENOCARCINOMA, SPANNING 2.2 CM IN GREATEST DIMENSION superior segment left lower lobe 2013 Ascending thoracic aorta 4.3 cm diameter The patient underwent stereotactic radiotherapy to the left upper lobe lesion. Completed 01/03/13 EGFR positive Several ground-glass nodules and several small left-sided solid nodules are present in the lungs. These are stable from 08/16/2014 but for the most part have enlarged compared to 08/18/11.  The patient continues to have multiple small bilateral nodules some which have changed slightly and others that are unchanged. With his previous history of 2 separate lung malignancies in in the left lung In the suspicious nature of the current lesions PET scan done, the 7 mm left lower lobe lung nodule was hypermetabolic, a right lower lobe groundglass opacity is not. Recent ebus and navigation bronchoscopy both to the left and right lower lobe lesions revealed no definitive diagnosis of recurrent malignancy however the patient's progression on CT scan and PET scan is very worrisome for multicentric adenocarcinoma. The patient's xrays and path were presented in the multidisciplinary thoracic oncology conference last week , he was seen by medical oncology to consider treatment considering the previous biopsy that was EGRF positive. The films were reviewed by interventional radiology , Although the left lung lesion is a small lesion,needle biopsy of the left lower lobe lesion could be attempted, the patient's had 2 previous thoracotomies on the left so the risk of left pneumothorax is  decreased some.  PET 08/21/15 IMPRESSION: 1. The superior segment right lower lobe sub solid pulmonary nodule is not significantly hypermetabolic. As low-grade adenocarcinoma is typically not hypermetabolic, this remains a concern. 2. Increase in low-level hypermetabolism within non pathologically enlarged thoracic nodes. These could be reactive or related to metastatic disease. This could be re-evaluated at followup or 1 or more these nodes could be sampled. 3. A left lower lobe pulmonary nodule has enlarged since the prior PET and demonstrates hypermetabolism. Suspicious for metastatic or metachronous carcinoma.  Now scheduled for Left low lobe lung mass biopsy per Dr Servando Snare request Last dose coumadin 10/05/15  Past Medical History  Diagnosis Date  . Hyperlipidemia     takes Lovastatin daiy  . Esophageal reflux   . Arthritis     "knees"  . CAD (coronary artery disease)     nonobstructive by cath 07/22/11 with small to moderate sized diagonal branch with ostial stenosis , medical therapy advised  . Carotid artery aneurysm (HCC)     Bilateral internal carotid artery aneurysms followed by Dr. Donnetta Hutching  . Prostate cancer ([place])     s/p cryotherapy  . Lung cancer, primary, with metastasis from lung to other site [facility]) dx'd 09/2011 & 11/2012  . Anemia   . COPD (chronic obstructive pulmonary disease) (HCC)     Albuterol as needed  . Insomnia     takes Ambien nightly  . Hypertension     takes Metoprolol daily  . AV block     s/p PPM.Takes Coumadin daily but on hold until after surgery  . History of blood clots     lungs.On Coumadin for over 10 yrs ago  . Shortness of breath     lying,sitting,exertion  .  Headache     occasionally  . Dizziness     rarely  . Restless leg   . Peripheral edema   . Fibromyalgia   . Bruises easily   . History of colon polyps   . History of kidney stones   . Urinary urgency   . Nocturia     Past Surgical History  Procedure Laterality Date  .  Cystoscopy    . Lung surgery  1957    "born w/bleb in LLL  . Insert / replace / remove pacemaker  1991    initial placement  . Insert / replace / remove pacemaker  ~ 2011    "changed out"  . Tonsillectomy and adenoidectomy  1943  . Appendectomy  2000's  . Cholecystectomy  2000's  . Cataract extraction w/ intraocular lens implant Bilateral ~ 2008  . Lithotripsy  ~ 2011    "twice"  . Cardiac catheterization    . Prostate cryoablation  ~ 2008  . Video bronchoscopy  10/20/2011    Procedure: VIDEO BRONCHOSCOPY;  Surgeon: Grace Isaac, MD;  Location: [place];  Service: Thoracic;  Laterality: N/A;  . Needle core biopsy  12/06/2012    Lung/lul  . Esophagogastroduodenoscopy N/A 09/20/2013    Procedure: ESOPHAGOGASTRODUODENOSCOPY (EGD);  Surgeon: Winfield Cunas., MD;  Location[facility] ENDOSCOPY;  Service: Endoscopy;  Laterality: N/A;  . Left heart catheterization with coronary angiogram N/A 07/22/2011    Procedure: LEFT HEART CATHETERIZATION WITH CORONARY ANGIOGRAM;  Surgeon: Burnell Blanks, MD;  Location: [facility] CATH LAB;  Service: Cardiovascular;  Laterality: N/A;  . Colonoscopy    . Colonoscopy    . Video bronchoscopy with endobronchial ultrasound N/A 09/03/2015    Procedure: VIDEO BRONCHOSCOPY WITH ENDOBRONCHIAL ULTRASOUND;  Surgeon: Grace Isaac, MD;  Location: [place];  Service: Thoracic;  Laterality: N/A;  . Video bronchoscopy with endobronchial navigation N/A 09/03/2015    Procedure: VIDEO BRONCHOSCOPY WITH ENDOBRONCHIAL NAVIGATION;  Surgeon: Grace Isaac, MD;  Location: [facility] OR;  Service: Thoracic;  Laterality: N/A;    Allergies: Iohexol and Penicillins  Medications: Prior to Admission medications   Medication Sig Start Date End Date Taking? Authorizing Provider  citalopram (CELEXA) 10 MG tablet Take 10 mg by mouth daily.   Yes Historical Provider, MD  lovastatin (MEVACOR) 20 MG tablet Take 20 mg by mouth daily. 08/09/15  Yes Historical Provider, MD  tamsulosin (FLOMAX) 0.4  MG CAPS capsule Take 0.4 mg by mouth daily.   Yes Historical Provider, MD  warfarin (COUMADIN) 5 MG tablet Take 5 mg by mouth daily at 6 PM.  09/21/13  Yes Debbe Odea, MD  zolpidem (AMBIEN) 5 MG tablet Take 5 mg by mouth at bedtime as needed for sleep.   Yes Historical Provider, MD  albuterol (PROVENTIL HFA;VENTOLIN HFA) 108 (90 BASE) MCG/ACT inhaler Inhale 2 puffs into the lungs every 6 (six) hours as needed for wheezing or shortness of breath. Reported on 09/05/2015    Historical Provider, MD  nitroGLYCERIN (NITROSTAT) 0.4 MG SL tablet Place 1 tablet (0.4 mg total) under the tongue every 5 (five) minutes as needed. For chest pain 09/10/14   Thompson Grayer, MD     Family History  Problem Relation Age of Onset  . Hypertension Neg Hx   . Hyperlipidemia Neg Hx   . Heart failure Neg Hx   . Heart disease Neg Hx   . Colon cancer Neg Hx   . Breast cancer Mother   . Cancer Mother  Breast  . Emphysema Sister     was a smoker  . Brain cancer Father 35     type unknown    Social History   Social History  . Marital Status: Married    Spouse Name: N/A  . Number of Children: 1  . Years of Education: N/A   Occupational History  . Retired     Administrator    Social History Main Topics  . Smoking status: Current Some Day Smoker -- 0.50 packs/day for 50 years    Types: Cigarettes  . Smokeless tobacco: Current User    Types: Chew  . Alcohol Use: No     Comment: 3-4 beers a day  . Drug Use: No  . Sexual Activity: Not Currently   Other Topics Concern  . None   Social History Narrative   2 caffeine drink daily      Review of Systems: A 12 point ROS discussed and pertinent positives are indicated in the HPI above.  All other systems are negative.  Review of Systems  Constitutional: Negative for fever, activity change, appetite change and fatigue.  Respiratory: Negative for shortness of breath.   Cardiovascular: Negative for chest pain.  Neurological: Negative for weakness.    Psychiatric/Behavioral: Negative for behavioral problems and confusion.    Vital Signs: BP 182/94 mmHg  Pulse 91  Temp(Src) 97.5 F (36.4 C)  Resp 16  Ht _0  (1.803 m)  Wt 133 lb (60.328 kg)  BMI 18.56 kg/m2  SpO2 98%  Physical Exam  Constitutional: He is oriented to person, place, and time.  Cardiovascular: Normal rate and regular rhythm.   Pulmonary/Chest: Effort normal and breath sounds normal.  Abdominal: Soft. Bowel sounds are normal.  Musculoskeletal: Normal range of motion.  Neurological: He is alert and oriented to person, place, and time.  Skin: Skin is warm and dry.  Psychiatric: He has a normal mood and affect. His behavior is normal. Judgment and thought content normal.  Nursing note and vitals reviewed.   Mallampati Score:  MD Evaluation Airway: WNL Heart: WNL Abdomen: WNL Chest/ Lungs: WNL ASA  Classification: 3 Mallampati/Airway Score: One  Imaging: Ct Head W Wo Contrast  09/18/2015  CLINICAL DATA:  Metastatic lung cancer. Dizziness and occasional headaches. Weight loss over the last several months. EXAM: CT HEAD WITHOUT AND WITH CONTRAST TECHNIQUE: Contiguous axial images were obtained from the base of the skull through the vertex without and with intravenous contrast CONTRAST:  49m ISOVUE-300 IOPAMIDOL (ISOVUE-300) INJECTION 61% COMPARISON:  10/23/2013 FINDINGS: The brain shows generalized age related atrophy. There are chronic small-vessel ischemic changes of the deep white matter. No cortical or large vessel territory infarction. No mass lesion, hemorrhage, hydrocephalus or extra-axial collection. No abnormal contrast enhancement. No skull or skullbase lesion. Visualized sinuses are clear. There is atherosclerotic calcification of the major vessels at the base of the brain. This includes calcification of the distal cervical internal carotid artery on the left which appears ectatic. IMPRESSION: No acute finding. Atrophy and chronic small vessel disease.  No evidence of metastatic disease. Atherosclerosis. Electronically Signed   By: MNelson ChimesM.D.   On: 09/18/2015 10:41    Labs:  CBC:  Recent Labs  04/10/15 0558 09/02/15 0817 09/05/15 1422 10/10/15 0953  WBC 7.8 5.5 5.6 6.6  HGB 15.0 14.3 13.4 14.1  HCT 44.1 43.3 39.9 41.4  PLT 211 232 205 234    COAGS:  Recent Labs  04/10/15 0558 04/11/15 0550 09/02/15 0817 10/10/15 09604  INR 1.83* 1.87* 1.02 0.98  APTT  --   --  33 33    BMP:  Recent Labs  04/09/15 1204 04/10/15 0558 09/02/15 0817 09/05/15 1422  NA 140 139 137 139  K 3.8 3.6 4.3 3.8  CL 104 106 102  --   CO2 _0 GLUCOSE 124* 86 91 80  BUN _1 7.1  CALCIUM 11.0* 10.4* 10.9* 10.6*  CREATININE 0.89 0.88 0.83 1.0  GFRNONAA >60 >60 >60  --   GFRAA >60 >60 >60  --     LIVER FUNCTION TESTS:  Recent Labs  09/02/15 0817 09/05/15 1422  BILITOT 1.2 0.92  AST 24 15  ALT 14* 11  ALKPHOS 88 91  PROT 6.3* 6.6  ALBUMIN 3.3* 3.1*    TUMOR MARKERS: No results for input(s): AFPTM, CEA, CA199, CHROMGRNA in the last 8760 hours.  Assessment and Plan:  Hx lung Ca x 2 Enlarging LLL mass PET + Scheduled for left lung mass biopsy in IR Risks and Benefits discussed with the patient including, but not limited to bleeding, hemoptysis, respiratory failure requiring intubation, infection, pneumothorax requiring chest tube placement, stroke from air embolism or even death. All of the patient's questions were answered, patient is agreeable to proceed. Consent signed and in chart.   Thank you for this interesting consult.  I greatly enjoyed meeting [name] and look forward to participating in their care.  A copy of this report was sent to the requesting provider on this date.  Electronically Signed: Elijan Googe A 10/10/2015, 10:20 AM   I spent a total of  30 Minutes   in face to face in clinical consultation, greater than 50% of which was counseling/coordinating care for left lung mass  bx

## 2015-10-10 NOTE — Procedures (Signed)
Technically successful CT guided biopsy of hypermetabolic left lower lobe pulmonary nodule   EBL: Minimal  Procedure complicated by development of self limited, submassive hemoptysis not requiring intubation.  Ronny Bacon, MD Pager #: [phone]

## 2015-10-10 NOTE — Sedation Documentation (Addendum)
Resp status improving- sats 92 on mask, have pt R side  down per MD. Rapid reponse on scene assessing pt.  Hemoptysis has stopped, will monitor pt at RN station L side down fro 1 hour befor going to [facility] and get Xray. Code Blue was cancelled, pts status has improved, no need for emergency intervention

## 2015-10-10 NOTE — Sedation Documentation (Signed)
Chest xray at this time

## 2015-10-10 NOTE — Sedation Documentation (Signed)
Pt in RN holding area, sats 99% NRB, no hemoptysis, MD talking wiith family

## 2015-10-10 NOTE — Sedation Documentation (Signed)
Patient is resting comfortably. 

## 2015-10-10 NOTE — Sedation Documentation (Signed)
Pt is resting on let side. Daughter at bedside.

## 2015-10-10 NOTE — Sedation Documentation (Signed)
V\\\Chest Xray ok per MD. Can transfer to [facility]. Md advises pt he may cough up blood over next few days.

## 2015-10-10 NOTE — Sedation Documentation (Signed)
Pt vital signs are stable. Pt is alert and oriented, daughter at bedside. Awaiting CXR results

## 2015-10-10 NOTE — Sedation Documentation (Signed)
bandaid L upper back area

## 2015-10-10 NOTE — Sedation Documentation (Addendum)
Pt having lots of hemoptysis, sats 85% on 5L/[place]. 100% mask applied, MD in room. MD asks for resp  And for staff to call code for respiratory support. Pt alert, talking, color little dusky compared to pre-procedure, denies any SOB

## 2015-10-10 NOTE — Sedation Documentation (Signed)
Daughter at bedside, Doing better. MD aware.

## 2015-10-10 NOTE — Discharge Instructions (Signed)

## 2015-10-14 ENCOUNTER — Telehealth: Payer: Self-pay | Admitting: Cardiothoracic Surgery

## 2015-10-14 NOTE — Telephone Encounter (Signed)
Reviewed path report with patients daughter at his request. Path reported forwarded to Dr Kinnie Scales, Patient to see next week.

## 2015-12-18 ENCOUNTER — Ambulatory Visit (INDEPENDENT_AMBULATORY_CARE_PROVIDER_SITE_OTHER): Payer: Medicare Other | Admitting: *Deleted

## 2015-12-18 DIAGNOSIS — I442 Atrioventricular block, complete: Secondary | ICD-10-CM

## 2015-12-18 NOTE — Progress Notes (Signed)
Remote pacemaker transmission.   

## 2015-12-25 ENCOUNTER — Encounter: Payer: Self-pay | Admitting: Cardiology

## 2016-01-16 LAB — CUP PACEART REMOTE DEVICE CHECK
Battery Remaining Longevity: 76 mo
Battery Remaining Percentage: 81 %
Battery Voltage: 2.93 V
Brady Statistic AP VP Percent: 1 %
Brady Statistic AP VS Percent: 4.2 %
Brady Statistic AS VP Percent: 1 %
Brady Statistic AS VS Percent: 95 %
Brady Statistic RA Percent Paced: 3.9 %
Brady Statistic RV Percent Paced: 1 %
Date Time Interrogation Session: 20170927060713
Implantable Lead Implant Date: 19910722
Implantable Lead Implant Date: 19910722
Implantable Lead Location: 753859
Implantable Lead Location: 753860
Lead Channel Impedance Value: 330 Ohm
Lead Channel Impedance Value: 450 Ohm
Lead Channel Pacing Threshold Amplitude: 0.75 V
Lead Channel Pacing Threshold Amplitude: 1 V
Lead Channel Pacing Threshold Pulse Width: 1 ms
Lead Channel Pacing Threshold Pulse Width: 1 ms
Lead Channel Sensing Intrinsic Amplitude: 4.2 mV
Lead Channel Sensing Intrinsic Amplitude: 9.4 mV
Lead Channel Setting Pacing Amplitude: 2.5 V
Lead Channel Setting Pacing Amplitude: 3 V
Lead Channel Setting Pacing Pulse Width: 1 ms
Lead Channel Setting Sensing Sensitivity: 2 mV
Pulse Gen Model: 2210
Pulse Gen Serial Number: 7167612

## 2016-02-12 ENCOUNTER — Telehealth: Payer: Self-pay | Admitting: Internal Medicine

## 2016-02-12 NOTE — Telephone Encounter (Signed)
New message  Jana Half w/[facility]  Needs copy of pace maker card front and back copy faxed

## 2016-02-12 NOTE — Telephone Encounter (Signed)
Duplicate see below  Pt had EKG  Caretaker wonders if the pacemaker is not working properly  Please advise

## 2016-02-12 NOTE — Telephone Encounter (Signed)
Theodoro Doing, RN faxed device info.

## 2016-02-12 NOTE — Telephone Encounter (Signed)
New message  Pt recently diag: Lung & bone cancer  Caretaker states Heart Rate is at 95 and erratic   BP is normal 120/70  Daughter has taken to PCP Dr. Delfino Lovett Adelman/didn't want to go to ER  Caretaker, Santiago Glad, wants to know at what setting the pacemaker is set at, thinks maybe setting is too high(?)  Please call back and advise

## 2016-03-18 ENCOUNTER — Telehealth: Payer: Self-pay | Admitting: Cardiology

## 2016-03-18 ENCOUNTER — Encounter: Payer: Medicare PPO | Admitting: *Deleted

## 2016-03-20 ENCOUNTER — Encounter: Payer: Self-pay | Admitting: Cardiology

## 2016-03-23 NOTE — Telephone Encounter (Signed)
LMOVM reminding pt to send remote transmission.   

## 2016-03-23 DEATH — deceased

## 2016-03-30 ENCOUNTER — Telehealth: Payer: Self-pay | Admitting: Cardiology

## 2016-03-30 NOTE — Telephone Encounter (Signed)
Pt daughter called and stated that pt passed away on 28-Mar-2017. Informed pt daughter that she can recycle or discard of the home monitor. Pt daughter verbalized understanding.

## 2017-11-22 IMAGING — CT CT CHEST W/O CM
2 of 4 series · 15 of 30 positions shown, 17 images · non-contrast
Comparison: 02/07/2015

CLINICAL DATA: History of left lung cancer in 7362.  Former smoker.

EXAM:
CT CHEST WITHOUT CONTRAST
TECHNIQUE: Multidetector CT imaging of the chest was performed following the
standard protocol without IV contrast.

[Series 3: chest w/o · axial · non-contrast · 0.70mm/px · z∈[-310,-30]mm · 8 of 144 slices shown, 10 images]
[im 16/144  mediastinal]
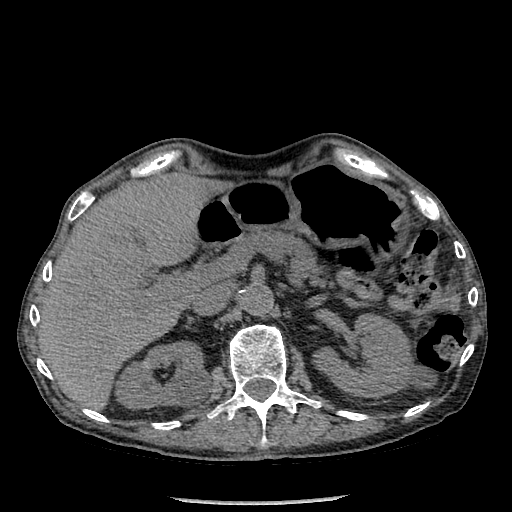
[im 16/144  lung]
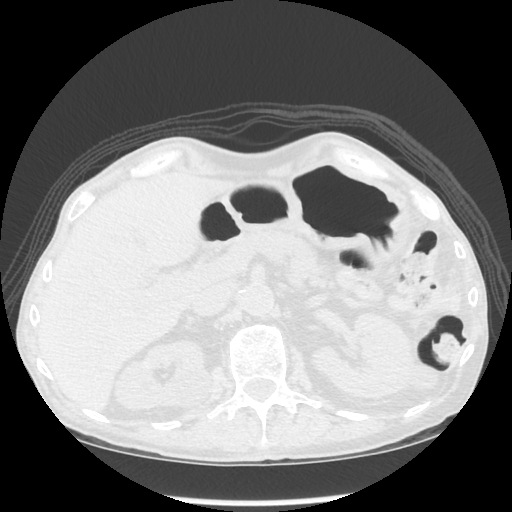
[im 32/144  lung]
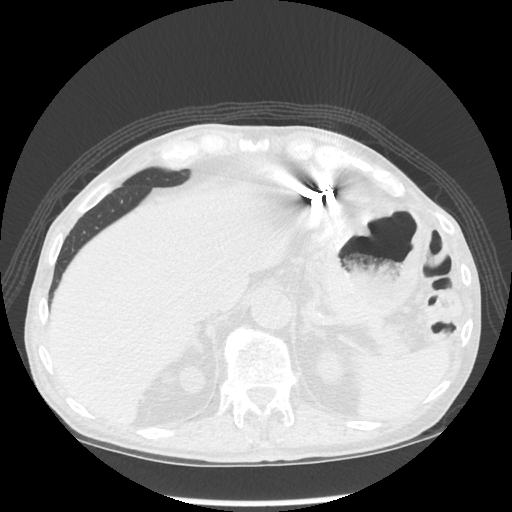
[im 48/144  lung]
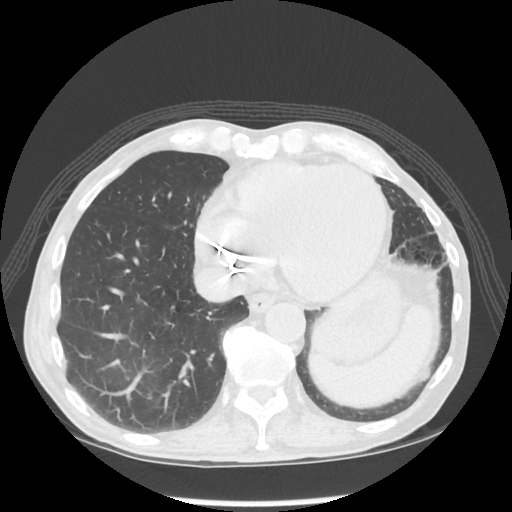
[im 64/144  lung]
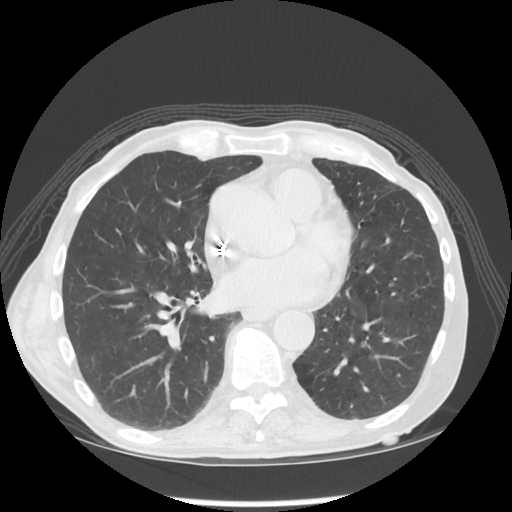
[im 80/144  mediastinal]
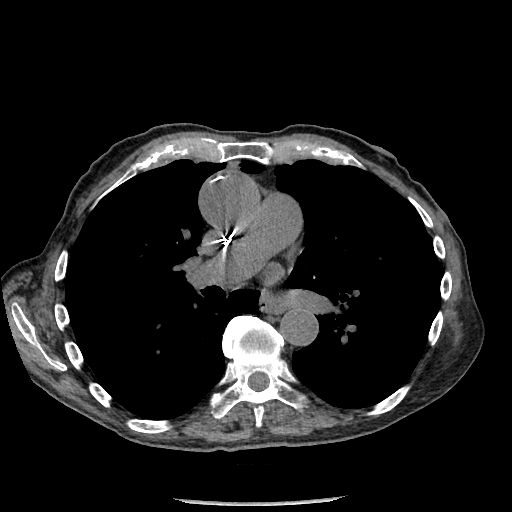
[im 80/144  lung]
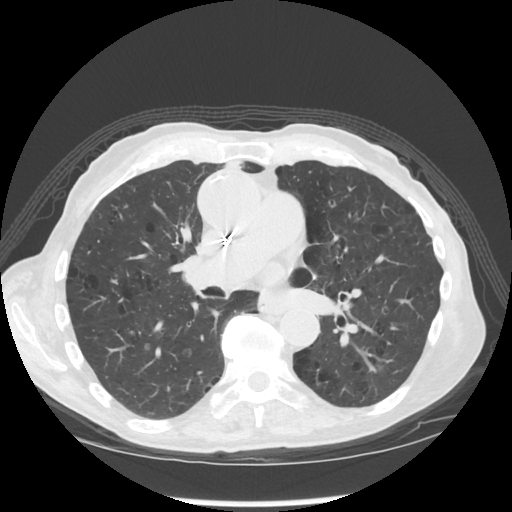
[im 96/144  lung]
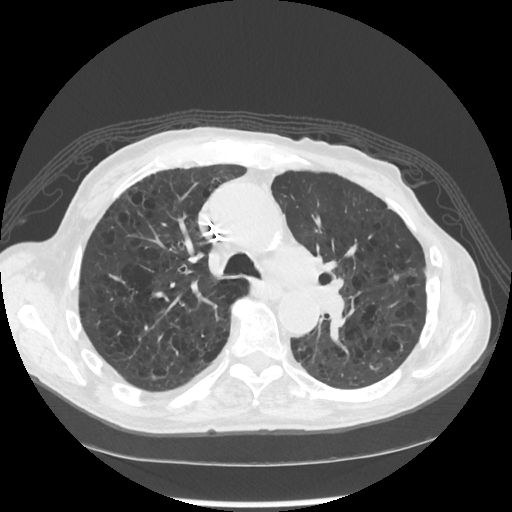
[im 112/144  lung]
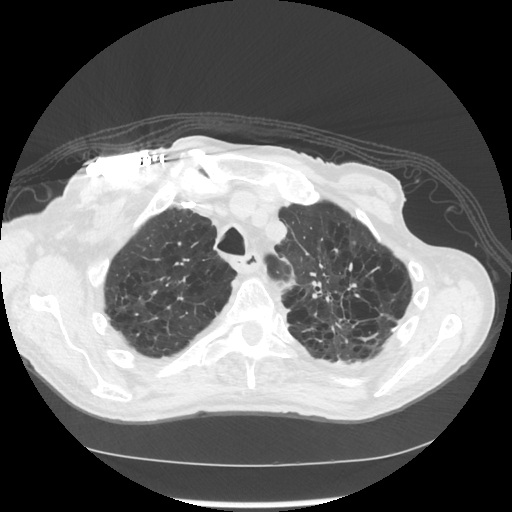
[im 128/144  lung]
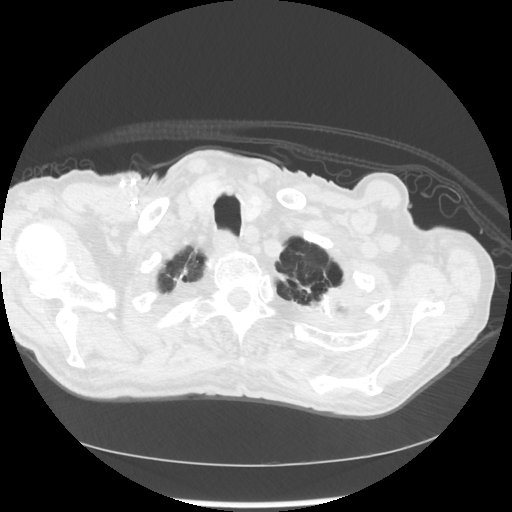

[Series 4: lung windows · axial · 0.70mm/px · z∈[-304,-34]mm · 7 of 144 slices shown]
[im 18/144  lung]
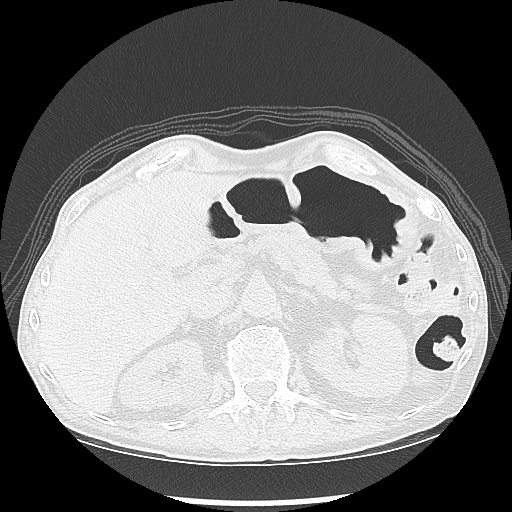
[im 36/144  lung]
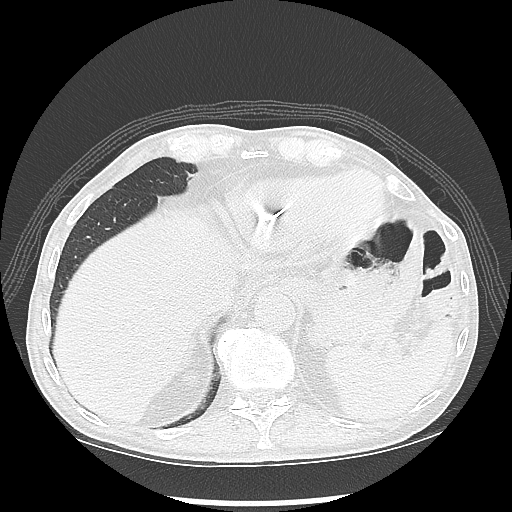
[im 54/144  lung]
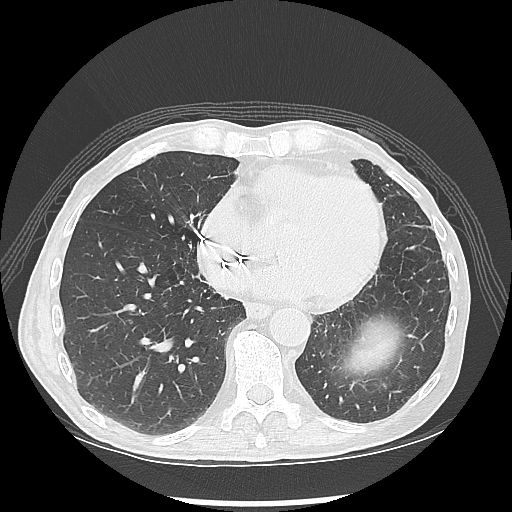
[im 72/144  lung]
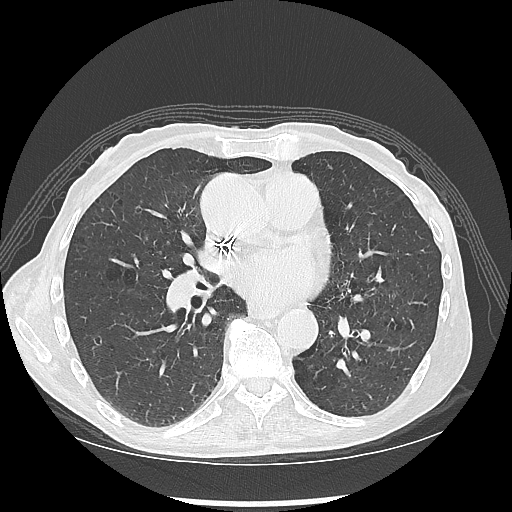
[im 90/144  lung]
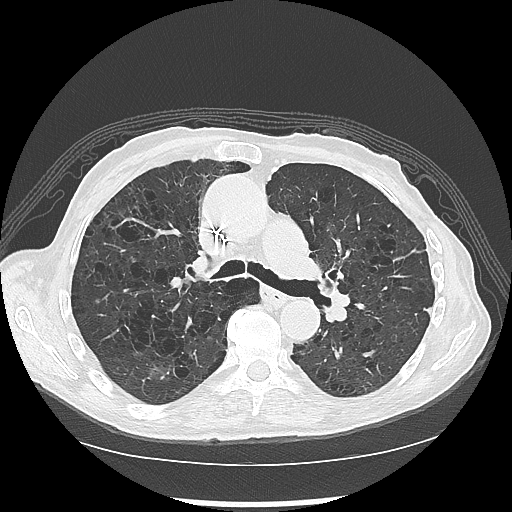
[im 108/144  lung]
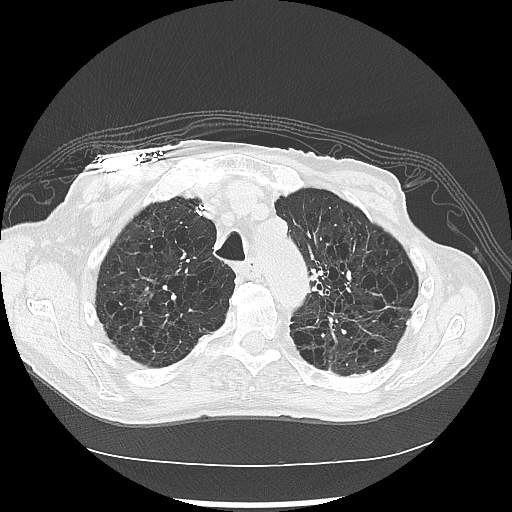
[im 126/144  lung]
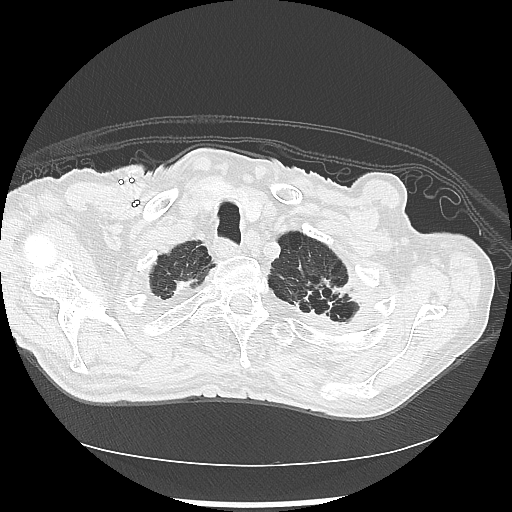

[15 of 30 positions shown; findings below may reference images not displayed]

IMPRESSION: 1. Within the superior segment of the right lower lobe there is an
enlarging sub solid nodule. This has an equivalent diameter of
cm. Previously 1.5 cm. This is suspicious for a low-grade indolent
neoplasm such as pulmonary adenocarcinoma.
2. Interval development of multiple thin walled cavitary nodules in
both lungs. Although nonspecific the appearance is suggestive of
pulmonary Langerhans Cell Histiocytosis. Less favored diagnostic
considerations include (but are not limited to) multiple septic
emboli or of metastatic squamous cell carcinoma.
3. Emphysema
4. Aortic atherosclerosis and LAD coronary artery calcification.
FINDINGS: Mediastinum: The heart size appears normal. Aortic atherosclerosis
noted. Calcification involving the LAD coronary artery noted. There
is no mediastinal or hilar adenopathy. There is no axillary or
supraclavicular adenopathy.

Lungs/Pleura: No pleural fluid identified. Moderate to advanced
changes of centrilobular and paraseptal emphysema. The left upper
lobe pulmonary nodule is unchanged measuring 4 mm, image 43 of
series 4. Sub solid nodule within the posterior right upper lobe
measures 1.7 x 1.9 cm and has an equivalent diameter of 1.8 cm,
image number 53 of series 4. On the previous exam this had an
equivalent diameter of 1.5 cm. In the left lower lobe there is a 8 x
7 mm nodule (8 mm mean diameter) on image 63 of series 4. Previously
this had an equivalent diameter of 7 mm.. In the right lower low
there is a 9 x 7 mm nodule (8 mm mean diameter) on image 85 of
series 4. This is unchanged from previous exam.

Multiple new cavitary nodules are scattered throughout both lungs.
For example, in the right lower lobe new cavitary nodule measures 7
by 6 mm, mean diameter 7 mm, image 66 of series 4. In the left lower
lobe posteriorly there is a new cavitary 6 x 6 mm nodule (6 mm mean
diameter) on image 78 of series 4. In the right lower lobe there is
a new cavitary nodule measuring 4 x 5 mm nodule (5 mm mean diameter)
on image 87 of series 4.

Upper Abdomen: There is no acute findings identified within the
upper abdomen. The adrenal glands are normal. There are bilateral
renal cysts of varying density. These are incompletely characterized
without IV contrast. Small nonobstructing calculi are noted within
the upper pole of the left kidney.

Musculoskeletal: No aggressive lytic or sclerotic bone lesions
identified. Degenerative disc disease noted within the thoracic
spine.
